# Patient Record
Sex: Male | Born: 1990 | Hispanic: Yes | Marital: Married | Smoking: Never smoker
Health system: Southern US, Community
[De-identification: ages and names within clinical notes are randomized; demographics above are authoritative.]

## PROBLEM LIST (undated history)

## (undated) NOTE — *Deleted (*Deleted)
Occupational Therapy Discharge Summary  Patient Details  Name: Jason Skinner MRN: [id number] Date of Birth: 10/23/1990     Patient has met 9 of 9 long term goals due to improved activity tolerance, improved balance, postural control, ability to compensate for deficits, functional use of  RIGHT upper extremity, improved attention, improved awareness and improved coordination.  Patient to discharge at overall Supervision level.  Patient's care partner is independent to provide the necessary physical and cognitive assistance at discharge.    Reasons goals not met: n/a  Recommendation:  Patient will benefit from ongoing skilled OT services in home health setting to continue to advance functional skills in the area of BADL.  Equipment: No equipment provided - pt already has a shower chair  Reasons for discharge: treatment goals met  Patient/family agrees with progress made and goals achieved: Yes  OT Discharge Precautions/Restrictions  Precautions Precautions: Fall   ADL ADL Eating: Set up Grooming: Supervision/safety Where Assessed-Grooming: Standing at sink Upper Body Bathing: Supervision/safety Where Assessed-Upper Body Bathing: Shower Lower Body Bathing: Supervision/safety Where Assessed-Lower Body Bathing: Shower Upper Body Dressing: Supervision/safety Where Assessed-Upper Body Dressing: Edge of bed Lower Body Dressing: Supervision/safety Where Assessed-Lower Body Dressing: Edge of bed Toileting: Supervision/safety Where Assessed-Toileting: Teacher, adult education: Close supervision Toilet Transfer Method: Proofreader: Engineer, technical sales: Unable to Engineer, technical sales: Close supervision Film/video editor Method: Designer, industrial/product: Emergency planning/management officer, Grab bars Vision Patient Visual Report: No change from baseline Ocular Range of Motion: Within Functional Limits Alignment/Gaze Preference:  Gaze left (pt can gaze to the R but often only focuses on the L) Tracking/Visual Pursuits: Decreased smoothness of horizontal tracking;Decreased smoothness of vertical tracking Visual Fields: No apparent deficits Perception  Perception: Impaired Inattention/Neglect: Does not attend to right visual field Praxis Praxis: Intact Cognition Overall Cognitive Status: Impaired/Different from baseline Selective Attention: Impaired Selective Attention Impairment: Functional basic Problem Solving: Impaired Problem Solving Impairment: Functional complex;Functional basic Sensation Sensation Light Touch: Appears Intact Hot/Cold: Appears Intact Proprioception: Appears Intact Stereognosis: Appears Intact Coordination Gross Motor Movements are Fluid and Coordinated: No Fine Motor Movements are Fluid and Coordinated: No Coordination and Movement Description: limited due to generalized weakness, slow movements but able to open containers without A; slow gross motor movement patterns Motor  Motor Motor: Abnormal postural alignment and control Motor - Skilled Clinical Observations: tends to lean to the R in sitting and standing Mobility     Trunk/Postural Assessment  Postural Control Postural Control: Deficits on evaluation Righting Reactions: delayed, R lean in sit and standing; will occasionally lose his balance posteriorly sitting EOB or in standing Protective Responses: delayed  Balance Static Sitting Balance Static Sitting - Level of Assistance: 7: Independent Dynamic Sitting Balance Dynamic Sitting - Level of Assistance: 5: Stand by assistance Static Standing Balance Static Standing - Level of Assistance: 5: Stand by assistance Dynamic Standing Balance Dynamic Standing - Level of Assistance: 5: Stand by assistance Extremity/Trunk Assessment RUE Assessment Active Range of Motion (AROM) Comments: [facility] Strength Comments: 4-5 shoulder; 4-/5 elbow and hand LUE Assessment Active  Range of Motion (AROM) Comments: [facility] Strength Comments: 4-5 shoulder; 4-/5 elbow and hand   Angell Honse 12/14/2019, 1:04 PM

---

## 2019-09-05 ENCOUNTER — Emergency Department (HOSPITAL_COMMUNITY): Payer: Medicaid Other

## 2019-09-05 ENCOUNTER — Inpatient Hospital Stay (HOSPITAL_COMMUNITY): Payer: Medicaid Other

## 2019-09-05 ENCOUNTER — Inpatient Hospital Stay (HOSPITAL_COMMUNITY): Admission: EM | Disposition: A | Discharge status: Rehabilitation Facility | Payer: Self-pay | Source: Home / Self Care

## 2019-09-05 ENCOUNTER — Emergency Department (HOSPITAL_COMMUNITY): Payer: Medicaid Other | Admitting: Certified Registered Nurse Anesthetist

## 2019-09-05 ENCOUNTER — Inpatient Hospital Stay (HOSPITAL_COMMUNITY)
Admission: EM | Admit: 2019-09-05 | Discharge: 2019-09-20 | DRG: 025 | Disposition: A | Discharge status: Rehabilitation Facility | Payer: Medicaid Other | Attending: General Surgery | Admitting: General Surgery

## 2019-09-05 DIAGNOSIS — Y990 Civilian activity done for income or pay: Secondary | ICD-10-CM

## 2019-09-05 DIAGNOSIS — Y9269 Other specified industrial and construction area as the place of occurrence of the external cause: Secondary | ICD-10-CM

## 2019-09-05 DIAGNOSIS — R4020 Unspecified coma: Secondary | ICD-10-CM | POA: Diagnosis not present

## 2019-09-05 DIAGNOSIS — S02119A Unspecified fracture of occiput, initial encounter for closed fracture: Secondary | ICD-10-CM | POA: Diagnosis not present

## 2019-09-05 DIAGNOSIS — Z20822 Contact with and (suspected) exposure to covid-19: Secondary | ICD-10-CM | POA: Diagnosis not present

## 2019-09-05 DIAGNOSIS — W11XXXA Fall on and from ladder, initial encounter: Secondary | ICD-10-CM | POA: Diagnosis present

## 2019-09-05 DIAGNOSIS — S066X9A Traumatic subarachnoid hemorrhage with loss of consciousness of unspecified duration, initial encounter: Secondary | ICD-10-CM | POA: Diagnosis not present

## 2019-09-05 DIAGNOSIS — E871 Hypo-osmolality and hyponatremia: Secondary | ICD-10-CM | POA: Diagnosis not present

## 2019-09-05 DIAGNOSIS — I82612 Acute embolism and thrombosis of superficial veins of left upper extremity: Secondary | ICD-10-CM | POA: Diagnosis present

## 2019-09-05 DIAGNOSIS — M21921 Unspecified acquired deformity of right upper arm: Secondary | ICD-10-CM

## 2019-09-05 DIAGNOSIS — D72829 Elevated white blood cell count, unspecified: Secondary | ICD-10-CM | POA: Diagnosis present

## 2019-09-05 DIAGNOSIS — G935 Compression of brain: Secondary | ICD-10-CM | POA: Diagnosis present

## 2019-09-05 DIAGNOSIS — S0219XA Other fracture of base of skull, initial encounter for closed fracture: Secondary | ICD-10-CM | POA: Diagnosis present

## 2019-09-05 DIAGNOSIS — J9690 Respiratory failure, unspecified, unspecified whether with hypoxia or hypercapnia: Secondary | ICD-10-CM

## 2019-09-05 DIAGNOSIS — D62 Acute posthemorrhagic anemia: Secondary | ICD-10-CM | POA: Diagnosis not present

## 2019-09-05 DIAGNOSIS — E876 Hypokalemia: Secondary | ICD-10-CM | POA: Diagnosis present

## 2019-09-05 DIAGNOSIS — Z9911 Dependence on respirator [ventilator] status: Secondary | ICD-10-CM

## 2019-09-05 DIAGNOSIS — R339 Retention of urine, unspecified: Secondary | ICD-10-CM | POA: Diagnosis not present

## 2019-09-05 DIAGNOSIS — S0291XB Unspecified fracture of skull, initial encounter for open fracture: Secondary | ICD-10-CM

## 2019-09-05 DIAGNOSIS — R4182 Altered mental status, unspecified: Secondary | ICD-10-CM | POA: Diagnosis present

## 2019-09-05 DIAGNOSIS — S065X9A Traumatic subdural hemorrhage with loss of consciousness of unspecified duration, initial encounter: Principal | ICD-10-CM | POA: Diagnosis present

## 2019-09-05 DIAGNOSIS — S065XAA Traumatic subdural hemorrhage with loss of consciousness status unknown, initial encounter: Secondary | ICD-10-CM | POA: Diagnosis present

## 2019-09-05 DIAGNOSIS — Z4659 Encounter for fitting and adjustment of other gastrointestinal appliance and device: Secondary | ICD-10-CM

## 2019-09-05 DIAGNOSIS — W19XXXA Unspecified fall, initial encounter: Secondary | ICD-10-CM

## 2019-09-05 DIAGNOSIS — R1310 Dysphagia, unspecified: Secondary | ICD-10-CM | POA: Diagnosis not present

## 2019-09-05 DIAGNOSIS — T1490XA Injury, unspecified, initial encounter: Secondary | ICD-10-CM

## 2019-09-05 HISTORY — PX: CRANIOTOMY: SHX93

## 2019-09-05 LAB — COMPREHENSIVE METABOLIC PANEL
ALT: 16 U/L (ref 0–44)
AST: 31 U/L (ref 15–41)
Albumin: 3.8 g/dL (ref 3.5–5.0)
Alkaline Phosphatase: 58 U/L (ref 38–126)
Anion gap: 13 (ref 5–15)
BUN: 8 mg/dL (ref 6–20)
CO2: 20 mmol/L — ABNORMAL LOW (ref 22–32)
Calcium: 8.7 mg/dL — ABNORMAL LOW (ref 8.9–10.3)
Chloride: 109 mmol/L (ref 98–111)
Creatinine, Ser: 0.84 mg/dL (ref 0.61–1.24)
GFR calc Af Amer: >60 mL/min (ref >60)
GFR calc non Af Amer: >60 mL/min (ref >60)
Glucose, Bld: 166 mg/dL — ABNORMAL HIGH (ref 70–99)
Potassium: 3.1 mmol/L — ABNORMAL LOW (ref 3.5–5.1)
Sodium: 142 mmol/L (ref 135–145)
Total Bilirubin: 1.2 mg/dL (ref 0.3–1.2)
Total Protein: 7.2 g/dL (ref 6.5–8.1)

## 2019-09-05 LAB — BASIC METABOLIC PANEL
Anion gap: 8 (ref 5–15)
BUN: 5 mg/dL — ABNORMAL LOW (ref 6–20)
CO2: 21 mmol/L — ABNORMAL LOW (ref 22–32)
Calcium: 7.5 mg/dL — ABNORMAL LOW (ref 8.9–10.3)
Chloride: 112 mmol/L — ABNORMAL HIGH (ref 98–111)
Creatinine, Ser: 0.63 mg/dL (ref 0.61–1.24)
GFR calc Af Amer: >60 mL/min (ref >60)
GFR calc non Af Amer: >60 mL/min (ref >60)
Glucose, Bld: 138 mg/dL — ABNORMAL HIGH (ref 70–99)
Potassium: 3.4 mmol/L — ABNORMAL LOW (ref 3.5–5.1)
Sodium: 141 mmol/L (ref 135–145)

## 2019-09-05 LAB — SAMPLE TO BLOOD BANK

## 2019-09-05 LAB — ABO/RH: ABO/RH(D): O POS

## 2019-09-05 LAB — POCT I-STAT 7, (LYTES, BLD GAS, ICA,H+H)
Acid-base deficit: 2 mmol/L (ref 0.0–2.0)
Bicarbonate: 23.1 mmol/L (ref 20.0–28.0)
Calcium, Ion: 1.08 mmol/L — ABNORMAL LOW (ref 1.15–1.40)
HCT: 38 % — ABNORMAL LOW (ref 39.0–52.0)
Hemoglobin: 12.9 g/dL — ABNORMAL LOW (ref 13.0–17.0)
O2 Saturation: 100 %
Patient temperature: 34.3
Potassium: 2.6 mmol/L — CL (ref 3.5–5.1)
Sodium: 141 mmol/L (ref 135–145)
TCO2: 24 mmol/L (ref 22–32)
pCO2 arterial: 34 mmHg (ref 32.0–48.0)
pH, Arterial: 7.429 (ref 7.350–7.450)
pO2, Arterial: 169 mmHg — ABNORMAL HIGH (ref 83.0–108.0)

## 2019-09-05 LAB — URINALYSIS, ROUTINE W REFLEX MICROSCOPIC
Bacteria, UA: NONE SEEN
Bilirubin Urine: NEGATIVE
Glucose, UA: NEGATIVE mg/dL
Ketones, ur: NEGATIVE mg/dL
Leukocytes,Ua: NEGATIVE
Nitrite: NEGATIVE
Protein, ur: NEGATIVE mg/dL
Specific Gravity, Urine: 1.03 (ref 1.005–1.030)
pH: 5 (ref 5.0–8.0)

## 2019-09-05 LAB — I-STAT CHEM 8, ED
BUN: 8 mg/dL (ref 6–20)
Calcium, Ion: 1.07 mmol/L — ABNORMAL LOW (ref 1.15–1.40)
Chloride: 106 mmol/L (ref 98–111)
Creatinine, Ser: 0.8 mg/dL (ref 0.61–1.24)
Glucose, Bld: 169 mg/dL — ABNORMAL HIGH (ref 70–99)
HCT: 44 % (ref 39.0–52.0)
Hemoglobin: 15 g/dL (ref 13.0–17.0)
Potassium: 2.8 mmol/L — ABNORMAL LOW (ref 3.5–5.1)
Sodium: 143 mmol/L (ref 135–145)
TCO2: 23 mmol/L (ref 22–32)

## 2019-09-05 LAB — CBC
HCT: 43.3 % (ref 39.0–52.0)
Hemoglobin: 14.6 g/dL (ref 13.0–17.0)
MCH: 31.5 pg (ref 26.0–34.0)
MCHC: 33.7 g/dL (ref 30.0–36.0)
MCV: 93.3 fL (ref 80.0–100.0)
Platelets: 284 10*3/uL (ref 150–400)
RBC: 4.64 MIL/uL (ref 4.22–5.81)
RDW: 14.1 % (ref 11.5–15.5)
WBC: 19.1 10*3/uL — ABNORMAL HIGH (ref 4.0–10.5)
nRBC: 0 % (ref 0.0–0.2)

## 2019-09-05 LAB — PROTIME-INR
INR: 1 (ref 0.8–1.2)
Prothrombin Time: 13.2 seconds (ref 11.4–15.2)

## 2019-09-05 LAB — SARS CORONAVIRUS 2 BY RT PCR (HOSPITAL ORDER, PERFORMED IN ~~LOC~~ HOSPITAL LAB): SARS Coronavirus 2: NEGATIVE

## 2019-09-05 LAB — HIV ANTIBODY (ROUTINE TESTING W REFLEX): HIV Screen 4th Generation wRfx: NONREACTIVE

## 2019-09-05 LAB — LACTIC ACID, PLASMA: Lactic Acid, Venous: 3.1 mmol/L (ref 0.5–1.9)

## 2019-09-05 LAB — PREPARE RBC (CROSSMATCH)

## 2019-09-05 LAB — ETHANOL: Alcohol, Ethyl (B): <10 mg/dL (ref <10)

## 2019-09-05 LAB — MRSA PCR SCREENING: MRSA by PCR: NEGATIVE

## 2019-09-05 SURGERY — CRANIOTOMY HEMATOMA EVACUATION SUBDURAL
Anesthesia: General | Site: Head | Laterality: Right

## 2019-09-05 MED ORDER — POTASSIUM CHLORIDE 10 MEQ/100ML IV SOLN
INTRAVENOUS | Status: DC | PRN
  Administered 2019-09-05 (×2): 10 meq via INTRAVENOUS

## 2019-09-05 MED ORDER — ROCURONIUM BROMIDE 10 MG/ML (PF) SYRINGE
PREFILLED_SYRINGE | INTRAVENOUS | Status: AC
  Filled 2019-09-05: qty 10

## 2019-09-05 MED ORDER — 0.9 % SODIUM CHLORIDE (POUR BTL) OPTIME
TOPICAL | Status: DC | PRN
  Administered 2019-09-05 (×3): 1000 mL

## 2019-09-05 MED ORDER — BUPIVACAINE-EPINEPHRINE 0.5% -1:200000 IJ SOLN
INTRAMUSCULAR | Status: AC
  Filled 2019-09-05: qty 1

## 2019-09-05 MED ORDER — MANNITOL 25 % IV SOLN
25.0000 g | Freq: Once | INTRAVENOUS | Status: DC
  Filled 2019-09-05: qty 100

## 2019-09-05 MED ORDER — BACITRACIN ZINC 500 UNIT/GM EX OINT
TOPICAL_OINTMENT | CUTANEOUS | Status: DC | PRN
  Administered 2019-09-05: 1 via TOPICAL

## 2019-09-05 MED ORDER — ONDANSETRON HCL 4 MG/2ML IJ SOLN
INTRAMUSCULAR | Status: AC
  Filled 2019-09-05: qty 2

## 2019-09-05 MED ORDER — IOHEXOL 350 MG/ML SOLN
100.0000 mL | Freq: Once | INTRAVENOUS | Status: AC | PRN
  Administered 2019-09-05: 100 mL via INTRAVENOUS

## 2019-09-05 MED ORDER — THROMBIN 5000 UNITS EX SOLR
CUTANEOUS | Status: AC
  Filled 2019-09-05: qty 5000

## 2019-09-05 MED ORDER — ONDANSETRON HCL 4 MG/2ML IJ SOLN: 4.0000 mg | Freq: Four times a day (QID) | INTRAMUSCULAR | Status: DC | PRN

## 2019-09-05 MED ORDER — FENTANYL CITRATE (PF) 250 MCG/5ML IJ SOLN
INTRAMUSCULAR | Status: DC | PRN
  Administered 2019-09-05 (×3): 50 ug via INTRAVENOUS
  Administered 2019-09-05: 100 ug via INTRAVENOUS

## 2019-09-05 MED ORDER — POTASSIUM CHLORIDE 10 MEQ/100ML IV SOLN
INTRAVENOUS | Status: AC
  Filled 2019-09-05: qty 200

## 2019-09-05 MED ORDER — ETOMIDATE 2 MG/ML IV SOLN
INTRAVENOUS | Status: AC | PRN
  Administered 2019-09-05: 20 mg via INTRAVENOUS

## 2019-09-05 MED ORDER — SODIUM CHLORIDE 0.9 % IV SOLN: INTRAVENOUS | Status: DC

## 2019-09-05 MED ORDER — FENTANYL CITRATE (PF) 250 MCG/5ML IJ SOLN
INTRAMUSCULAR | Status: AC
  Filled 2019-09-05: qty 5

## 2019-09-05 MED ORDER — THROMBIN 20000 UNITS EX SOLR
CUTANEOUS | Status: AC
  Filled 2019-09-05: qty 20000

## 2019-09-05 MED ORDER — ACETAMINOPHEN 325 MG PO TABS
650.0000 mg | ORAL_TABLET | ORAL | Status: DC | PRN
  Filled 2019-09-05: qty 2

## 2019-09-05 MED ORDER — FENTANYL 2500MCG IN NS 250ML (10MCG/ML) PREMIX INFUSION
0.0000 ug/h | INTRAVENOUS | Status: DC
  Administered 2019-09-06 – 2019-09-07 (×2): 100 ug/h via INTRAVENOUS
  Administered 2019-09-07 – 2019-09-08 (×2): 50 ug/h via INTRAVENOUS
  Administered 2019-09-10: 25 ug/h via INTRAVENOUS
  Filled 2019-09-05 (×4): qty 250

## 2019-09-05 MED ORDER — CHLORHEXIDINE GLUCONATE 0.12% ORAL RINSE (MEDLINE KIT)
15.0000 mL | Freq: Two times a day (BID) | OROMUCOSAL | Status: DC
  Administered 2019-09-05 – 2019-09-18 (×26): 15 mL via OROMUCOSAL

## 2019-09-05 MED ORDER — ORAL CARE MOUTH RINSE
15.0000 mL | OROMUCOSAL | Status: DC
  Administered 2019-09-05 – 2019-09-19 (×116): 15 mL via OROMUCOSAL

## 2019-09-05 MED ORDER — FENTANYL BOLUS VIA INFUSION
50.0000 ug | INTRAVENOUS | Status: DC | PRN
  Filled 2019-09-05: qty 50

## 2019-09-05 MED ORDER — ONDANSETRON 4 MG PO TBDP: 4.0000 mg | ORAL_TABLET | Freq: Four times a day (QID) | ORAL | Status: DC | PRN

## 2019-09-05 MED ORDER — SODIUM CHLORIDE 0.9 % IV SOLN: INTRAVENOUS | Status: DC | PRN

## 2019-09-05 MED ORDER — BACITRACIN ZINC 500 UNIT/GM EX OINT
TOPICAL_OINTMENT | CUTANEOUS | Status: AC
  Filled 2019-09-05: qty 28.35

## 2019-09-05 MED ORDER — SODIUM CHLORIDE 0.9% IV SOLUTION: Freq: Once | INTRAVENOUS | Status: DC

## 2019-09-05 MED ORDER — PANTOPRAZOLE SODIUM 40 MG PO TBEC: 40.0000 mg | DELAYED_RELEASE_TABLET | Freq: Every day | ORAL | Status: DC

## 2019-09-05 MED ORDER — THROMBIN 5000 UNITS EX SOLR
OROMUCOSAL | Status: DC | PRN
  Administered 2019-09-05 (×2): 5 mL via TOPICAL

## 2019-09-05 MED ORDER — ROCURONIUM BROMIDE 50 MG/5ML IV SOLN
INTRAVENOUS | Status: AC | PRN
  Administered 2019-09-05: 70 mg via INTRAVENOUS

## 2019-09-05 MED ORDER — LIDOCAINE 2% (20 MG/ML) 5 ML SYRINGE
INTRAMUSCULAR | Status: AC
  Filled 2019-09-05: qty 5

## 2019-09-05 MED ORDER — DEXAMETHASONE SODIUM PHOSPHATE 10 MG/ML IJ SOLN
INTRAMUSCULAR | Status: AC
  Filled 2019-09-05: qty 1

## 2019-09-05 MED ORDER — THROMBIN 20000 UNITS EX SOLR
CUTANEOUS | Status: DC | PRN
  Administered 2019-09-05: 20 mL via TOPICAL

## 2019-09-05 MED ORDER — PHENYLEPHRINE HCL-NACL 10-0.9 MG/250ML-% IV SOLN
INTRAVENOUS | Status: DC | PRN
  Administered 2019-09-05: 20 ug/min via INTRAVENOUS

## 2019-09-05 MED ORDER — POTASSIUM CHLORIDE 10 MEQ/100ML IV SOLN
10.0000 meq | INTRAVENOUS | Status: AC
  Administered 2019-09-05 (×2): 10 meq via INTRAVENOUS

## 2019-09-05 MED ORDER — ESMOLOL HCL 100 MG/10ML IV SOLN
INTRAVENOUS | Status: DC | PRN
Start: 2019-09-05 — End: 2019-09-05
  Administered 2019-09-05: 100 mg via INTRAVENOUS

## 2019-09-05 MED ORDER — BUPIVACAINE-EPINEPHRINE 0.5% -1:200000 IJ SOLN
INTRAMUSCULAR | Status: DC | PRN
  Administered 2019-09-05: 10 mL

## 2019-09-05 MED ORDER — FENTANYL CITRATE (PF) 100 MCG/2ML IJ SOLN: 50.0000 ug | Freq: Once | INTRAMUSCULAR | Status: DC

## 2019-09-05 MED ORDER — PANTOPRAZOLE SODIUM 40 MG IV SOLR
40.0000 mg | Freq: Every day | INTRAVENOUS | Status: DC
  Administered 2019-09-06 – 2019-09-11 (×6): 40 mg via INTRAVENOUS
  Filled 2019-09-05 (×6): qty 40

## 2019-09-05 MED ORDER — METOPROLOL TARTRATE 5 MG/5ML IV SOLN: 5.0000 mg | Freq: Four times a day (QID) | INTRAVENOUS | Status: DC | PRN

## 2019-09-05 MED ORDER — CEFAZOLIN SODIUM-DEXTROSE 2-3 GM-%(50ML) IV SOLR
INTRAVENOUS | Status: DC | PRN
  Administered 2019-09-05: 2 g via INTRAVENOUS

## 2019-09-05 MED ORDER — LEVETIRACETAM IN NACL 1000 MG/100ML IV SOLN
1000.0000 mg | Freq: Once | INTRAVENOUS | Status: AC
  Administered 2019-09-05: 1000 mg via INTRAVENOUS

## 2019-09-05 MED ORDER — MANNITOL 25 % IV SOLN
INTRAVENOUS | Status: DC | PRN
  Administered 2019-09-05: 25 g via INTRAVENOUS

## 2019-09-05 MED ORDER — CHLORHEXIDINE GLUCONATE CLOTH 2 % EX PADS
6.0000 | MEDICATED_PAD | Freq: Every day | CUTANEOUS | Status: DC
  Administered 2019-09-05 – 2019-09-07 (×3): 6 via TOPICAL

## 2019-09-05 MED ORDER — LEVETIRACETAM IN NACL 500 MG/100ML IV SOLN
500.0000 mg | Freq: Two times a day (BID) | INTRAVENOUS | Status: AC
  Administered 2019-09-05 – 2019-09-12 (×14): 500 mg via INTRAVENOUS
  Filled 2019-09-05 (×17): qty 100

## 2019-09-05 MED ORDER — ACETAMINOPHEN 650 MG RE SUPP: 650.0000 mg | RECTAL | Status: DC | PRN

## 2019-09-05 MED ORDER — ROCURONIUM BROMIDE 10 MG/ML (PF) SYRINGE
PREFILLED_SYRINGE | INTRAVENOUS | Status: DC | PRN
  Administered 2019-09-05: 10 mg via INTRAVENOUS
  Administered 2019-09-05: 40 mg via INTRAVENOUS
  Administered 2019-09-05: 50 mg via INTRAVENOUS

## 2019-09-05 MED ORDER — FENTANYL 2500MCG IN NS 250ML (10MCG/ML) PREMIX INFUSION
50.0000 ug/h | INTRAVENOUS | Status: DC
  Administered 2019-09-05: 50 ug/h via INTRAVENOUS
  Filled 2019-09-05: qty 250

## 2019-09-05 MED ORDER — SODIUM CHLORIDE 0.9 % IV SOLN
INTRAVENOUS | Status: DC | PRN
  Administered 2019-09-05: 500 mL

## 2019-09-05 MED ORDER — PROPOFOL 1000 MG/100ML IV EMUL
0.0000 ug/kg/min | INTRAVENOUS | Status: DC
  Administered 2019-09-05 (×2): 20 ug/kg/min via INTRAVENOUS
  Filled 2019-09-05: qty 100

## 2019-09-05 SURGICAL SUPPLY — 81 items
APL SKNCLS STERI-STRIP NONHPOA (GAUZE/BANDAGES/DRESSINGS) ×1
BAG DECANTER FOR FLEXI CONT (MISCELLANEOUS) ×2 IMPLANT
BAND INSRT 18 STRL LF DISP RB (MISCELLANEOUS)
BAND RUBBER #18 3X1/16 STRL (MISCELLANEOUS) IMPLANT
BATTERY IQ STERILE (MISCELLANEOUS) IMPLANT
BENZOIN TINCTURE PRP APPL 2/3 (GAUZE/BANDAGES/DRESSINGS) ×2 IMPLANT
BIT DRILL WIRE PASS 1.3MM (BIT) IMPLANT
BLADE CLIPPER SPEC (BLADE) ×2 IMPLANT
BTRY SRG DRVR 1.5 IQ (MISCELLANEOUS)
BUR ACORN 6.0 PRECISION (BURR) IMPLANT
BUR PRECISION FLUTE 6.0 (BURR) ×2 IMPLANT
BUR SPIRAL ROUTER 2.3 (BUR) ×2 IMPLANT
CANISTER SUCT 3000ML PPV (MISCELLANEOUS) ×4 IMPLANT
CARTRIDGE OIL MAESTRO DRILL (MISCELLANEOUS) ×1 IMPLANT
CATH FOLEY LATEX FREE 16FR (CATHETERS) ×2
CATH FOLEY LF 16FR (CATHETERS) ×1 IMPLANT
CATH VENTRIC 35X38 W/TROCAR LG (CATHETERS) ×2 IMPLANT
CLIP RANEY DISP (INSTRUMENTS) ×2 IMPLANT
CLIP VESOCCLUDE MED 6/CT (CLIP) IMPLANT
COVER BACK TABLE 60X90IN (DRAPES) IMPLANT
COVER WAND RF STERILE (DRAPES) IMPLANT
DIFFUSER DRILL AIR PNEUMATIC (MISCELLANEOUS) ×2 IMPLANT
DRAIN JACKSON PRATT 10MM FLAT (MISCELLANEOUS) ×4 IMPLANT
DRAPE INCISE IOBAN 66X45 STRL (DRAPES) ×2 IMPLANT
DRAPE NEUROLOGICAL W/INCISE (DRAPES) ×2 IMPLANT
DRAPE SURG 17X23 STRL (DRAPES) ×2 IMPLANT
DRAPE WARM FLUID 44X44 (DRAPES) ×2 IMPLANT
DRILL WIRE PASS 1.3MM (BIT)
DRSG OPSITE POSTOP 4X8 (GAUZE/BANDAGES/DRESSINGS) ×2 IMPLANT
ELECT REM PT RETURN 9FT ADLT (ELECTROSURGICAL) ×2
ELECTRODE REM PT RTRN 9FT ADLT (ELECTROSURGICAL) ×1 IMPLANT
EVACUATOR 1/8 PVC DRAIN (DRAIN) IMPLANT
EVACUATOR SILICONE 100CC (DRAIN) ×4 IMPLANT
FORCEPS BIPOLAR SPETZLER 8 1.0 (NEUROSURGERY SUPPLIES) ×2 IMPLANT
GAUZE 4X4 16PLY RFD (DISPOSABLE) IMPLANT
GAUZE SPONGE 4X4 12PLY STRL (GAUZE/BANDAGES/DRESSINGS) ×2 IMPLANT
GLOVE BIO SURGEON STRL SZ 6.5 (GLOVE) ×4 IMPLANT
GLOVE BIO SURGEON STRL SZ8 (GLOVE) ×2 IMPLANT
GLOVE BIO SURGEON STRL SZ8.5 (GLOVE) ×2 IMPLANT
GLOVE BIOGEL PI IND STRL 6.5 (GLOVE) ×1 IMPLANT
GLOVE BIOGEL PI IND STRL 7.0 (GLOVE) ×1 IMPLANT
GLOVE BIOGEL PI INDICATOR 6.5 (GLOVE) ×1
GLOVE BIOGEL PI INDICATOR 7.0 (GLOVE) ×1
GLOVE EXAM NITRILE XL STR (GLOVE) IMPLANT
GOWN STRL REUS W/ TWL LRG LVL3 (GOWN DISPOSABLE) ×2 IMPLANT
GOWN STRL REUS W/ TWL XL LVL3 (GOWN DISPOSABLE) ×1 IMPLANT
GOWN STRL REUS W/TWL LRG LVL3 (GOWN DISPOSABLE) ×4
GOWN STRL REUS W/TWL XL LVL3 (GOWN DISPOSABLE) ×2
HEMOSTAT POWDER SURGIFOAM 1G (HEMOSTASIS) ×4 IMPLANT
KIT BASIN OR (CUSTOM PROCEDURE TRAY) ×2 IMPLANT
KIT DRAIN CSF ACCUDRAIN (MISCELLANEOUS) ×2 IMPLANT
KIT TURNOVER KIT B (KITS) ×2 IMPLANT
MARKER SKIN DUAL TIP RULER LAB (MISCELLANEOUS) IMPLANT
NEEDLE HYPO 22GX1.5 SAFETY (NEEDLE) ×2 IMPLANT
NS IRRIG 1000ML POUR BTL (IV SOLUTION) ×6 IMPLANT
OIL CARTRIDGE MAESTRO DRILL (MISCELLANEOUS) ×2
PACK CRANIOTOMY CUSTOM (CUSTOM PROCEDURE TRAY) ×2 IMPLANT
PAD ARMBOARD 7.5X6 YLW CONV (MISCELLANEOUS) ×2 IMPLANT
PATTIES SURGICAL .25X.25 (GAUZE/BANDAGES/DRESSINGS) IMPLANT
PATTIES SURGICAL .5 X.5 (GAUZE/BANDAGES/DRESSINGS) IMPLANT
PATTIES SURGICAL .5 X3 (DISPOSABLE) IMPLANT
PATTIES SURGICAL 1X1 (DISPOSABLE) IMPLANT
PIN MAYFIELD SKULL DISP (PIN) ×2 IMPLANT
SLEEVE SURGEON STRL (DRAPES) ×2 IMPLANT
SPONGE NEURO XRAY DETECT 1X3 (DISPOSABLE) IMPLANT
SPONGE SURGIFOAM ABS GEL 100 (HEMOSTASIS) ×2 IMPLANT
STAPLER SKIN PROX WIDE 3.9 (STAPLE) ×8 IMPLANT
STRIP CLOSURE SKIN 1/2X4 (GAUZE/BANDAGES/DRESSINGS) ×2 IMPLANT
SUT ETHILON 3 0 FSL (SUTURE) ×4 IMPLANT
SUT NURALON 4 0 TR CR/8 (SUTURE) ×2 IMPLANT
SUT PROLENE 6 0 BV (SUTURE) IMPLANT
SUT VIC AB 2-0 CP2 18 (SUTURE) ×12 IMPLANT
SUT VIC AB 3-0 FS2 27 (SUTURE) IMPLANT
SUT VICRYL 4-0 PS2 18IN ABS (SUTURE) IMPLANT
SYR CONTROL 10ML LL (SYRINGE) ×2 IMPLANT
TOWEL GREEN STERILE (TOWEL DISPOSABLE) ×2 IMPLANT
TOWEL GREEN STERILE FF (TOWEL DISPOSABLE) ×2 IMPLANT
TRAY FOLEY MTR SLVR 16FR STAT (SET/KITS/TRAYS/PACK) IMPLANT
TUBE CONNECTING 20X1/4 (TUBING) ×2 IMPLANT
UNDERPAD 30X36 HEAVY ABSORB (UNDERPADS AND DIAPERS) IMPLANT
WATER STERILE IRR 1000ML POUR (IV SOLUTION) ×2 IMPLANT

## 2019-09-05 NOTE — Anesthesia Preprocedure Evaluation (Addendum)
Anesthesia Evaluation  Patient identified by MRN, date of birth, ID bandPreop documentation limited or incomplete due to emergent nature of procedure.  Airway Mallampati: Intubated       Dental   Pulmonary    Pulmonary exam normal breath sounds clear to auscultation       Cardiovascular  Rhythm:Regular Rate:Normal     Neuro/Psych Emergent trauma- fall from ladder 20 ft from ground, brain hemorrhage, intubated and sedated    GI/Hepatic   Endo/Other    Renal/GU      Musculoskeletal   Abdominal   Peds  Hematology   Anesthesia Other Findings   Reproductive/Obstetrics                            Anesthesia Physical Anesthesia Plan  ASA: IV and emergent  Anesthesia Plan: General   Post-op Pain Management:    Induction: Intravenous  PONV Risk Score and Plan: Treatment may vary due to age or medical condition  Airway Management Planned: Oral ETT  Additional Equipment: Arterial line  Intra-op Plan:   Post-operative Plan: Post-operative intubation/ventilation  Informed Consent:     Only emergency history available  Plan Discussed with: CRNA  Anesthesia Plan Comments:         Anesthesia Quick Evaluation

## 2019-09-05 NOTE — Consult Note (Signed)
Reason for Consult: Coma, subdural hematoma Referring Physician: The patient is a 29 year old Hispanic male from [place] who by report fell off a 25 foot ladder striking his head.  Again, by report, he was talking at the scene and could answer questions.  During transportation to [facility] via EMS the patient became unconscious.  He was intubated in the ER and a stat head CT demonstrated occipital and clivus fractures and a large right subdural hematoma with midline shift.  A neurosurgical consultation was requested via the trauma service.  I immediately saw the patient.  In the ER the patient was comatose and unresponsive.  There were no family members available.  I am dictating this consultation after the surgery because of the emergent nature of his surgery.  Jason Skinner is an 29 y.o. male.  HPI: As above  No past medical history on file.    No family history on file.  Social History:  has no history on file for tobacco use, alcohol use, and drug use.  Allergies: Not on File  Medications:  I have reviewed the patient's current medications. Prior to Admission:  No medications prior to admission.   Scheduled: . sodium chloride   Intravenous Once  . pantoprazole  40 mg Oral Daily   Or  . pantoprazole (PROTONIX) IV  40 mg Intravenous Daily   Continuous: . sodium chloride Stopped (09/05/19 1426)  . fentaNYL infusion INTRAVENOUS    . levETIRAcetam    . potassium chloride    . propofol (DIPRIVAN) infusion 20 mcg/kg/min (09/05/19 1346)   EXH:BZJIRCVELFYBO, acetaminophen, metoprolol tartrate, ondansetron **OR** ondansetron (ZOFRAN) IV Anti-infectives (From admission, onward)   Start     Dose/Rate Route Frequency Ordered Stop   09/05/19 1250  bacitracin 50,000 Units in sodium chloride 0.9 % 500 mL irrigation  Status:  Discontinued          As needed 09/05/19 1250 09/05/19 1414       Results for orders placed or performed during the hospital encounter of 09/05/19 (from  the past 48 hour(s))  Sample to Blood Bank     Status: None   Collection Time: 09/05/19 11:00 AM  Result Value Ref Range   Blood Bank Specimen SAMPLE AVAILABLE FOR TESTING    Sample Expiration      09/06/2019,2359 Performed at [facility], [place] [address], [place], [place] [address]   Type and screen [place]     Status: None (Preliminary result)   Collection Time: 09/05/19 11:00 AM  Result Value Ref Range   ABO/RH(D) O POS    Antibody Screen NEG    Sample Expiration      09/08/2019,2359 Performed at [facility], [place] [address], [place], [place] [address]    Unit Number [id number]    Blood Component Type RED CELLS,LR    Unit division 00    Status of Unit ALLOCATED    Transfusion Status OK TO TRANSFUSE    Crossmatch Result Compatible    Unit Number [id number]    Blood Component Type RED CELLS,LR    Unit division 00    Status of Unit ALLOCATED    Transfusion Status OK TO TRANSFUSE    Crossmatch Result Compatible   ABO/Rh     Status: None   Collection Time: 09/05/19 11:00 AM  Result Value Ref Range   ABO/RH(D)      O POS Performed at [facility] [address], [place], [place] [address]  Comprehensive metabolic panel     Status: Abnormal   Collection Time: 09/05/19 11:12 AM  Result Value Ref Range   Sodium 142 135 - 145 mmol/L   Potassium 3.1 (L) 3.5 - 5.1 mmol/L   Chloride 109 98 - 111 mmol/L   CO2 20 (L) 22 - 32 mmol/L   Glucose, Bld 166 (H) 70 - 99 mg/dL    Comment: Glucose reference range applies only to samples taken after fasting for at least 8 hours.   BUN 8 6 - 20 mg/dL   Creatinine, Ser 0.84 0.61 - 1.24 mg/dL   Calcium 8.7 (L) 8.9 - 10.3 mg/dL   Total Protein 7.2 6.5 - 8.1 g/dL   Albumin 3.8 3.5 - 5.0 g/dL   AST 31 15 - 41 U/L   ALT 16 0 - 44 U/L   Alkaline Phosphatase 58 38 - 126 U/L   Total Bilirubin 1.2 0.3 - 1.2 mg/dL   GFR calc non Af Amer >60 >60 mL/min   GFR calc Af Amer >60 >60 mL/min   Anion gap  13 5 - 15    Comment: Performed at [facility], [place] [address], [place], [place] [address]  CBC     Status: Abnormal   Collection Time: 09/05/19 11:12 AM  Result Value Ref Range   WBC 19.1 (H) 4.0 - 10.5 K/uL   RBC 4.64 4.22 - 5.81 MIL/uL   Hemoglobin 14.6 13.0 - 17.0 g/dL   HCT 43.3 39 - 52 %   MCV 93.3 80.0 - 100.0 fL   MCH 31.5 26.0 - 34.0 pg   MCHC 33.7 30.0 - 36.0 g/dL   RDW 14.1 11.5 - 15.5 %   Platelets 284 150 - 400 K/uL   nRBC 0.0 0.0 - 0.2 %    Comment: Performed at [facility], [place] [address], [place], [place] [address]  Ethanol     Status: None   Collection Time: 09/05/19 11:12 AM  Result Value Ref Range   Alcohol, Ethyl (B) <10 <10 mg/dL    Comment: (NOTE) Lowest detectable limit for serum alcohol is 10 mg/dL.  For medical purposes only. Performed at [facility], [place] [address], [place], [place] [address]   Lactic acid, plasma     Status: Abnormal   Collection Time: 09/05/19 11:12 AM  Result Value Ref Range   Lactic Acid, Venous 3.1 (HH) 0.5 - 1.9 mmol/L    Comment: CRITICAL RESULT CALLED TO, READ BACK BY AND VERIFIED WITH: HUNTER BOTTS,RN @1210  09/05/19 VANG.J Performed at [facility], [place] [address], [place], [place] [address]   Protime-INR     Status: None   Collection Time: 09/05/19 11:12 AM  Result Value Ref Range   Prothrombin Time 13.2 11.4 - 15.2 seconds   INR 1.0 0.8 - 1.2    Comment: (NOTE) INR goal varies based on device and disease states. Performed at [facility], [place] [address], [place],  [address]   I-Stat Chem 8, ED     Status: Abnormal   Collection Time: 09/05/19 11:16 AM  Result Value Ref Range   Sodium 143 135 - 145 mmol/L   Potassium 2.8 (L) 3.5 - 5.1 mmol/L   Chloride 106 98 - 111 mmol/L   BUN 8 6 - 20 mg/dL   Creatinine, Ser 0.80 0.61 - 1.24 mg/dL   Glucose, Bld 169 (H) 70 - 99 mg/dL    Comment: Glucose reference range applies only to  samples taken after fasting for at least 8 hours.    Calcium, Ion 1.07 (L) 1.15 - 1.40 mmol/L   TCO2 23 22 - 32 mmol/L   Hemoglobin 15.0 13.0 - 17.0 g/dL   HCT 44.0 39 - 52 %  SARS Coronavirus 2 by RT PCR (hospital order, performed in [facility] hospital lab) Nasopharyngeal Nasopharyngeal Swab     Status: None   Collection Time: 09/05/19 11:50 AM   Specimen: Nasopharyngeal Swab  Result Value Ref Range   SARS Coronavirus 2 NEGATIVE NEGATIVE    Comment: (NOTE) SARS-CoV-2 target nucleic acids are NOT DETECTED.  The SARS-CoV-2 RNA is generally detectable in upper and lower respiratory specimens during the acute phase of infection. The lowest concentration of SARS-CoV-2 viral copies this assay can detect is 250 copies / mL. A negative result does not preclude SARS-CoV-2 infection and should not be used as the sole basis for treatment or other patient management decisions.  A negative result may occur with improper specimen collection / handling, submission of specimen other than nasopharyngeal swab, presence of viral mutation(s) within the areas targeted by this assay, and inadequate number of viral copies (<250 copies / mL). A negative result must be combined with clinical observations, patient history, and epidemiological information.  Fact Sheet for Patients:   StrictlyIdeas.no  Fact Sheet for Healthcare Providers: BankingDealers.co.za  This test is not yet approved or  cleared by the [place] FDA and has been authorized for detection and/or diagnosis of SARS-CoV-2 by FDA under an Emergency Use Authorization (EUA).  This EUA will remain in effect (meaning this test can be used) for the duration of the COVID-19 declaration under Section 564(b)(1) of the Act, 21 U.S.C. section 360bbb-3(b)(1), unless the authorization is terminated or revoked sooner.  Performed at [facility], [place] [address] [place], [place] [address]   Prepare RBC (crossmatch)     Status: None    Collection Time: 09/05/19 12:15 PM  Result Value Ref Range   Order Confirmation      ORDER PROCESSED BY BLOOD BANK Performed at [facility], [place] [address], [place],  [address]     CT HEAD WO CONTRAST  Result Date: 09/05/2019 CLINICAL DATA:  Level 1 trauma EXAM: CT HEAD WITHOUT CONTRAST CT MAXILLOFACIAL WITHOUT CONTRAST CT CERVICAL SPINE WITHOUT CONTRAST TECHNIQUE: Multidetector CT imaging of the head, cervical spine, and maxillofacial structures were performed using the standard protocol without intravenous contrast. Multiplanar CT image reconstructions of the cervical spine and maxillofacial structures were also generated. COMPARISON:  None. FINDINGS: CT HEAD FINDINGS Brain: Mixed density subdural hematoma along the right cerebral convexity and left frontal pole. Mixed density could be from a continued and un clotted bleeding, especially on the right. The right-sided hematoma measures up to 15 mm in thickness. Midline shift measures up to 2 cm. Pneumocephalus with intraventricular extension. Right uncal herniation. Mix of contusion and subarachnoid hemorrhage over the left temporal pole which is edematous. No visible infarct. Vascular: Negative Skull: Midline skull fracture involving the occiput and clivus. There is extension into the right sphenoid sinus, which accounts for pneumocephalus and sinus opacification. Posterior scalp contusion CT MAXILLOFACIAL FINDINGS Osseous: Intact and located mandible. Midline clivus fracture extending to the right sphenoid sinus. Orbits: No evidence of injury. Sinuses: Right sphenoid and maxillary hemosinus Soft tissues: Negative CT CERVICAL SPINE FINDINGS Alignment: Normal Skull base and vertebrae: Clival fracture. No cervical spine fracture. Soft tissues and spinal canal: Gas present around the dens. The tectorial  membrane is continuous and visible. No visible canal hematoma Disc levels:  No degenerative changes or visible impingement. Upper chest:  Reported separately Critical Value/emergent results were discussed in person on 09/05/2019 at 11:35 am with Dr Bobbye Morton. IMPRESSION: Head CT: 1. Right more than left cerebral convexity subdural hematoma with leftward midline shift of 2 cm and right uncal herniation. 2. Left temporal pole contusion with overlying subarachnoid hemorrhage. 3. Occipital and clival midline fractures extending into the right sphenoid sinus with pneumocephalus. Cervical spine CT: Gas around the dens presumably related to the skull base fracture. Craniocervical alignment is normal and the tectorial membrane is visible. Alar or other ligamentous injury is still conceivable. Maxillofacial CT: Clival fracture extending into the right sphenoid sinus with hemosinus and pneumocephalus. Electronically Signed   By: Monte Fantasia M.D.   On: 09/05/2019 11:40   CT CHEST W CONTRAST  Result Date: 09/05/2019 CLINICAL DATA:  Moderate to severe chest trauma EXAM: CT CHEST, ABDOMEN, AND PELVIS WITH CONTRAST TECHNIQUE: Multidetector CT imaging of the chest, abdomen and pelvis was performed following the standard protocol during bolus administration of intravenous contrast. CONTRAST:  Dose is currently not available COMPARISON:  None. FINDINGS: CT CHEST FINDINGS Cardiovascular: Normal heart size. No pericardial effusion. No evidence of great vessel injury. Mediastinum/Nodes: No hematoma or pneumomediastinum. There is a coarse calcification in the right lobe of the thyroid which is asymmetrically larger with a 2 cm nodular projection extending inferiorly that is isodense to the remaining gland. Lungs/Pleura: No hemothorax, pneumothorax, or lung contusion. Dependent opacity with volume loss is likely atelectasis and aspiration. Musculoskeletal: No acute finding CT ABDOMEN PELVIS FINDINGS Hepatobiliary: No hepatic injury or perihepatic hematoma. Gallbladder is unremarkable Pancreas: Negative Spleen: Left upper quadrant poly splenia or localize splenosis. No  acute injury. Adrenals/Urinary Tract: No adrenal hemorrhage or renal injury identified. Bladder is unremarkable. Stomach/Bowel: No evidence of injury Vascular/Lymphatic: No acute vascular finding Reproductive: Negative Other: No ascites or pneumoperitoneum. Musculoskeletal: L1 and L2 right transverse process fractures. Vacuum phenomenon around the sacroiliac joints without diastasis IMPRESSION: 1. L1 and L2 right transverse process fractures. 2. Bilateral dependent atelectasis and or aspiration. 3. 2 cm nodular projection from the right thyroid lobe. After convalescence, recommend thyroid US(Ref: J Am Coll Radiol. 2015 Feb;12(2): 143-50). Electronically Signed   By: Monte Fantasia M.D.   On: 09/05/2019 11:45   CT CERVICAL SPINE WO CONTRAST  Result Date: 09/05/2019 CLINICAL DATA:  Level 1 trauma EXAM: CT HEAD WITHOUT CONTRAST CT MAXILLOFACIAL WITHOUT CONTRAST CT CERVICAL SPINE WITHOUT CONTRAST TECHNIQUE: Multidetector CT imaging of the head, cervical spine, and maxillofacial structures were performed using the standard protocol without intravenous contrast. Multiplanar CT image reconstructions of the cervical spine and maxillofacial structures were also generated. COMPARISON:  None. FINDINGS: CT HEAD FINDINGS Brain: Mixed density subdural hematoma along the right cerebral convexity and left frontal pole. Mixed density could be from a continued and un clotted bleeding, especially on the right. The right-sided hematoma measures up to 15 mm in thickness. Midline shift measures up to 2 cm. Pneumocephalus with intraventricular extension. Right uncal herniation. Mix of contusion and subarachnoid hemorrhage over the left temporal pole which is edematous. No visible infarct. Vascular: Negative Skull: Midline skull fracture involving the occiput and clivus. There is extension into the right sphenoid sinus, which accounts for pneumocephalus and sinus opacification. Posterior scalp contusion CT MAXILLOFACIAL FINDINGS  Osseous: Intact and located mandible. Midline clivus fracture extending to the right sphenoid sinus. Orbits: No evidence of injury. Sinuses: Right sphenoid  and maxillary hemosinus Soft tissues: Negative CT CERVICAL SPINE FINDINGS Alignment: Normal Skull base and vertebrae: Clival fracture. No cervical spine fracture. Soft tissues and spinal canal: Gas present around the dens. The tectorial membrane is continuous and visible. No visible canal hematoma Disc levels:  No degenerative changes or visible impingement. Upper chest: Reported separately Critical Value/emergent results were discussed in person on 09/05/2019 at 11:35 am with Dr Bobbye Morton. IMPRESSION: Head CT: 1. Right more than left cerebral convexity subdural hematoma with leftward midline shift of 2 cm and right uncal herniation. 2. Left temporal pole contusion with overlying subarachnoid hemorrhage. 3. Occipital and clival midline fractures extending into the right sphenoid sinus with pneumocephalus. Cervical spine CT: Gas around the dens presumably related to the skull base fracture. Craniocervical alignment is normal and the tectorial membrane is visible. Alar or other ligamentous injury is still conceivable. Maxillofacial CT: Clival fracture extending into the right sphenoid sinus with hemosinus and pneumocephalus. Electronically Signed   By: Monte Fantasia M.D.   On: 09/05/2019 11:40   CT ABDOMEN PELVIS W CONTRAST  Result Date: 09/05/2019 CLINICAL DATA:  Moderate to severe chest trauma EXAM: CT CHEST, ABDOMEN, AND PELVIS WITH CONTRAST TECHNIQUE: Multidetector CT imaging of the chest, abdomen and pelvis was performed following the standard protocol during bolus administration of intravenous contrast. CONTRAST:  Dose is currently not available COMPARISON:  None. FINDINGS: CT CHEST FINDINGS Cardiovascular: Normal heart size. No pericardial effusion. No evidence of great vessel injury. Mediastinum/Nodes: No hematoma or pneumomediastinum. There is a coarse  calcification in the right lobe of the thyroid which is asymmetrically larger with a 2 cm nodular projection extending inferiorly that is isodense to the remaining gland. Lungs/Pleura: No hemothorax, pneumothorax, or lung contusion. Dependent opacity with volume loss is likely atelectasis and aspiration. Musculoskeletal: No acute finding CT ABDOMEN PELVIS FINDINGS Hepatobiliary: No hepatic injury or perihepatic hematoma. Gallbladder is unremarkable Pancreas: Negative Spleen: Left upper quadrant poly splenia or localize splenosis. No acute injury. Adrenals/Urinary Tract: No adrenal hemorrhage or renal injury identified. Bladder is unremarkable. Stomach/Bowel: No evidence of injury Vascular/Lymphatic: No acute vascular finding Reproductive: Negative Other: No ascites or pneumoperitoneum. Musculoskeletal: L1 and L2 right transverse process fractures. Vacuum phenomenon around the sacroiliac joints without diastasis IMPRESSION: 1. L1 and L2 right transverse process fractures. 2. Bilateral dependent atelectasis and or aspiration. 3. 2 cm nodular projection from the right thyroid lobe. After convalescence, recommend thyroid US(Ref: J Am Coll Radiol. 2015 Feb;12(2): 143-50). Electronically Signed   By: Monte Fantasia M.D.   On: 09/05/2019 11:45   DG Pelvis Portable  Result Date: 09/05/2019 CLINICAL DATA:  Level 1 trauma. Fell 20 feet. EXAM: PORTABLE PELVIS 1-2 VIEWS COMPARISON:  None. FINDINGS: Both hips are normally located. No hip fracture. The pubic symphysis and SI joints are intact. No definite pelvic fractures. IMPRESSION: No acute bony findings. Electronically Signed   By: Marijo Sanes M.D.   On: 09/05/2019 11:22   DG Chest Port 1 View  Result Date: 09/05/2019 CLINICAL DATA:  Level 1 trauma. Fell 20 feet. EXAM: PORTABLE CHEST 1 VIEW COMPARISON:  None. FINDINGS: The endotracheal tube is in good position, 3.8 cm above the carina. The cardiac silhouette, mediastinal and hilar contours are within normal limits.  The lungs are clear. No pleural effusion or pneumothorax. The bony thorax is grossly intact. No obvious acute rib fractures. Remote healed mid left clavicle fracture is noted. IMPRESSION: 1. Endotracheal tube in good position, 3.8 cm above the carina. 2. No acute cardiopulmonary findings.  Electronically Signed   By: Marijo Sanes M.D.   On: 09/05/2019 11:22   CT MAXILLOFACIAL WO CONTRAST  Result Date: 09/05/2019 CLINICAL DATA:  Level 1 trauma EXAM: CT HEAD WITHOUT CONTRAST CT MAXILLOFACIAL WITHOUT CONTRAST CT CERVICAL SPINE WITHOUT CONTRAST TECHNIQUE: Multidetector CT imaging of the head, cervical spine, and maxillofacial structures were performed using the standard protocol without intravenous contrast. Multiplanar CT image reconstructions of the cervical spine and maxillofacial structures were also generated. COMPARISON:  None. FINDINGS: CT HEAD FINDINGS Brain: Mixed density subdural hematoma along the right cerebral convexity and left frontal pole. Mixed density could be from a continued and un clotted bleeding, especially on the right. The right-sided hematoma measures up to 15 mm in thickness. Midline shift measures up to 2 cm. Pneumocephalus with intraventricular extension. Right uncal herniation. Mix of contusion and subarachnoid hemorrhage over the left temporal pole which is edematous. No visible infarct. Vascular: Negative Skull: Midline skull fracture involving the occiput and clivus. There is extension into the right sphenoid sinus, which accounts for pneumocephalus and sinus opacification. Posterior scalp contusion CT MAXILLOFACIAL FINDINGS Osseous: Intact and located mandible. Midline clivus fracture extending to the right sphenoid sinus. Orbits: No evidence of injury. Sinuses: Right sphenoid and maxillary hemosinus Soft tissues: Negative CT CERVICAL SPINE FINDINGS Alignment: Normal Skull base and vertebrae: Clival fracture. No cervical spine fracture. Soft tissues and spinal canal: Gas present  around the dens. The tectorial membrane is continuous and visible. No visible canal hematoma Disc levels:  No degenerative changes or visible impingement. Upper chest: Reported separately Critical Value/emergent results were discussed in person on 09/05/2019 at 11:35 am with Dr Bobbye Morton. IMPRESSION: Head CT: 1. Right more than left cerebral convexity subdural hematoma with leftward midline shift of 2 cm and right uncal herniation. 2. Left temporal pole contusion with overlying subarachnoid hemorrhage. 3. Occipital and clival midline fractures extending into the right sphenoid sinus with pneumocephalus. Cervical spine CT: Gas around the dens presumably related to the skull base fracture. Craniocervical alignment is normal and the tectorial membrane is visible. Alar or other ligamentous injury is still conceivable. Maxillofacial CT: Clival fracture extending into the right sphenoid sinus with hemosinus and pneumocephalus. Electronically Signed   By: Monte Fantasia M.D.   On: 09/05/2019 11:40    ROS Blood pressure (!) 168/76, pulse (!) 55, temperature (!) 96.2 F (35.7 C), temperature source Tympanic, resp. rate 18, height 5\' 10"  (1.778 m), weight 65 kg, SpO2 100 %. Estimated body mass index is 20.56 kg/m as calculated from the following:   Height as of this encounter: 5\' 10"  (1.778 m).   Weight as of this encounter: 65 kg.  Physical Exam  General: A thin comatose, intubated 29 year old Hispanic male with a cervical collar on.  HEENT: The patient has an abrasion over his right forehead.  His pupils are approximately 7 mm on the right, 6 mm on the left, both irregular and nonreactive.  Neck: No obvious abnormalities he has a hard collar on.  Thorax: Symmetric  Abdomen: Soft  Extremities: The patient has swelling and deformity of his right elbow.  Neurologic exam: The patient is Glasgow Coma Scale 3 intubated.  His pupils are as above.  There is no corneal reflex bilaterally.  He does not breathe  over the ventilator.  There is no response to noxious stimuli.  I have reviewed the patient's head CT performed at [facility] today.  He has a large right subdural hematoma with significant midline shift.  He has  a midline occipital skull fracture with a fracture through his clivus.  I reviewed the patient's cervical CT performed [facility] today.  There is no obvious acute abnormalities.     Assessment/Plan: Large right subdural hematoma, occipital skull fracture, clivus fracture: The patient is comatose with bilateral blown pupils but by report he was spots of an answer questions after his fall.  There are no family members available to discuss this with.  He was taken to the OR emergently for a craniotomy under emergency implied consent. Ophelia Charter 09/05/2019, 2:50 PM

## 2019-09-05 NOTE — Discharge Planning (Signed)
RNCM responded to level 1 trauma.  RNCM following for disposition needs. Jason Skinner J. Clydene Laming, [place], [place], [place]

## 2019-09-05 NOTE — ED Notes (Signed)
4N notified of pt by Trauma RN

## 2019-09-05 NOTE — Anesthesia Procedure Notes (Signed)
Arterial Line Insertion Start/End6/30/2021 11:50 AM, 09/05/2019 11:50 AM Performed by: Lavell Luster, CRNA  Preanesthetic checklist: patient identified, IV checked, risks and benefits discussed, surgical consent, pre-op evaluation and timeout performed Patient sedated Right, radial was placed Catheter size: 20 G Hand hygiene performed , maximum sterile barriers used  and Seldinger technique used Allen's test indicative of satisfactory collateral circulation Attempts: 1 Procedure performed without using ultrasound guided technique. Following insertion, Biopatch and dressing applied. Post procedure assessment: normal  Patient tolerated the procedure well with no immediate complications.

## 2019-09-05 NOTE — Progress Notes (Signed)
Subjective: The patient is intubated and sedated and in no apparent distress.  His brother is at the bedside.  Objective: Vital signs in last 24 hours: Temp:  [96.2 F (35.7 C)-98.1 F (36.7 C)] 98.1 F (36.7 C) (06/30 1900) Pulse Rate:  [55-86] 65 (06/30 1900) Resp:  [15-24] 18 (06/30 1900) BP: (95-168)/(54-107) 111/68 (06/30 1900) SpO2:  [100 %] 100 % (06/30 1900) Arterial Line BP: (127-144)/(53-58) 144/58 (06/30 1900) FiO2 (%):  [40 %-100 %] 40 % (06/30 1600) Weight:  [65 kg-77 kg] 77 kg (06/30 1630) Estimated body mass index is 24.36 kg/m as calculated from the following:   Height as of this encounter: 5\' 10"  (1.778 m).   Weight as of this encounter: 77 kg.   Intake/Output from previous day: No intake/output data recorded. Intake/Output this shift: No intake/output data recorded.  Physical exam scale coma scale 6, intubated, [id number].  The patient almost localizes on the left to painful stimuli.  He abnormal reflexes on the right.  His left pupil is approximately 2 mm and appears reactive.  His right pupil was 5 mm, irregular and reactive/sluggish.  His scalp flap is soft.  His ventriculostomy does not appear to be patent.  Lab Results: Recent Labs    09/05/19 1112 09/05/19 1112 09/05/19 1116 09/05/19 1206  WBC 19.1*  --   --   --   HGB 14.6   < > 15.0 12.9*  HCT 43.3   < > 44.0 38.0*  PLT 284  --   --   --    < > = values in this interval not displayed.   BMET Recent Labs    09/05/19 1112 09/05/19 1112 09/05/19 1116 09/05/19 1116 09/05/19 1206 09/05/19 1649  NA 142   < > 143   < > 141 141  K 3.1*   < > 2.8*   < > 2.6* 3.4*  CL 109   < > 106  --   --  112*  CO2 20*  --   --   --   --  21*  GLUCOSE 166*   < > 169*  --   --  138*  BUN 8   < > 8  --   --  5*  CREATININE 0.84   < > 0.80  --   --  0.63  CALCIUM 8.7*  --   --   --   --  7.5*   < > = values in this interval not displayed.    Studies/Results: DG ELBOW COMPLETE RIGHT (3+VIEW)  Result  Date: 09/05/2019 CLINICAL DATA:  Pain status post fall EXAM: RIGHT ELBOW - COMPLETE 3+ VIEW COMPARISON:  None. FINDINGS: There is no evidence of fracture, dislocation, or joint effusion. There is no evidence of arthropathy or other focal bone abnormality. Soft tissues are unremarkable. IMPRESSION: Negative. Electronically Signed   By: Constance Holster M.D.   On: 09/05/2019 14:53   DG Abd 1 View  Result Date: 09/05/2019 CLINICAL DATA:  Pain. EXAM: ABDOMEN - 1 VIEW COMPARISON:  CT from the same day FINDINGS: The enteric tube projects over the stomach. The visualized bowel gas pattern is unremarkable. IMPRESSION: Enteric tube projects over the stomach. Electronically Signed   By: Constance Holster M.D.   On: 09/05/2019 14:54   CT HEAD WO CONTRAST  Result Date: 09/05/2019 CLINICAL DATA:  Level 1 trauma EXAM: CT HEAD WITHOUT CONTRAST CT MAXILLOFACIAL WITHOUT CONTRAST CT CERVICAL SPINE WITHOUT CONTRAST TECHNIQUE: Multidetector CT imaging of the head, cervical spine,  and maxillofacial structures were performed using the standard protocol without intravenous contrast. Multiplanar CT image reconstructions of the cervical spine and maxillofacial structures were also generated. COMPARISON:  None. FINDINGS: CT HEAD FINDINGS Brain: Mixed density subdural hematoma along the right cerebral convexity and left frontal pole. Mixed density could be from a continued and un clotted bleeding, especially on the right. The right-sided hematoma measures up to 15 mm in thickness. Midline shift measures up to 2 cm. Pneumocephalus with intraventricular extension. Right uncal herniation. Mix of contusion and subarachnoid hemorrhage over the left temporal pole which is edematous. No visible infarct. Vascular: Negative Skull: Midline skull fracture involving the occiput and clivus. There is extension into the right sphenoid sinus, which accounts for pneumocephalus and sinus opacification. Posterior scalp contusion CT MAXILLOFACIAL  FINDINGS Osseous: Intact and located mandible. Midline clivus fracture extending to the right sphenoid sinus. Orbits: No evidence of injury. Sinuses: Right sphenoid and maxillary hemosinus Soft tissues: Negative CT CERVICAL SPINE FINDINGS Alignment: Normal Skull base and vertebrae: Clival fracture. No cervical spine fracture. Soft tissues and spinal canal: Gas present around the dens. The tectorial membrane is continuous and visible. No visible canal hematoma Disc levels:  No degenerative changes or visible impingement. Upper chest: Reported separately Critical Value/emergent results were discussed in person on 09/05/2019 at 11:35 am with Dr Bobbye Morton. IMPRESSION: Head CT: 1. Right more than left cerebral convexity subdural hematoma with leftward midline shift of 2 cm and right uncal herniation. 2. Left temporal pole contusion with overlying subarachnoid hemorrhage. 3. Occipital and clival midline fractures extending into the right sphenoid sinus with pneumocephalus. Cervical spine CT: Gas around the dens presumably related to the skull base fracture. Craniocervical alignment is normal and the tectorial membrane is visible. Alar or other ligamentous injury is still conceivable. Maxillofacial CT: Clival fracture extending into the right sphenoid sinus with hemosinus and pneumocephalus. Electronically Signed   By: Monte Fantasia M.D.   On: 09/05/2019 11:40   CT CHEST W CONTRAST  Result Date: 09/05/2019 CLINICAL DATA:  Moderate to severe chest trauma EXAM: CT CHEST, ABDOMEN, AND PELVIS WITH CONTRAST TECHNIQUE: Multidetector CT imaging of the chest, abdomen and pelvis was performed following the standard protocol during bolus administration of intravenous contrast. CONTRAST:  Dose is currently not available COMPARISON:  None. FINDINGS: CT CHEST FINDINGS Cardiovascular: Normal heart size. No pericardial effusion. No evidence of great vessel injury. Mediastinum/Nodes: No hematoma or pneumomediastinum. There is a coarse  calcification in the right lobe of the thyroid which is asymmetrically larger with a 2 cm nodular projection extending inferiorly that is isodense to the remaining gland. Lungs/Pleura: No hemothorax, pneumothorax, or lung contusion. Dependent opacity with volume loss is likely atelectasis and aspiration. Musculoskeletal: No acute finding CT ABDOMEN PELVIS FINDINGS Hepatobiliary: No hepatic injury or perihepatic hematoma. Gallbladder is unremarkable Pancreas: Negative Spleen: Left upper quadrant poly splenia or localize splenosis. No acute injury. Adrenals/Urinary Tract: No adrenal hemorrhage or renal injury identified. Bladder is unremarkable. Stomach/Bowel: No evidence of injury Vascular/Lymphatic: No acute vascular finding Reproductive: Negative Other: No ascites or pneumoperitoneum. Musculoskeletal: L1 and L2 right transverse process fractures. Vacuum phenomenon around the sacroiliac joints without diastasis IMPRESSION: 1. L1 and L2 right transverse process fractures. 2. Bilateral dependent atelectasis and or aspiration. 3. 2 cm nodular projection from the right thyroid lobe. After convalescence, recommend thyroid US(Ref: J Am Coll Radiol. 2015 Feb;12(2): 143-50). Electronically Signed   By: Monte Fantasia M.D.   On: 09/05/2019 11:45   CT CERVICAL SPINE WO CONTRAST  Result Date: 09/05/2019 CLINICAL DATA:  Level 1 trauma EXAM: CT HEAD WITHOUT CONTRAST CT MAXILLOFACIAL WITHOUT CONTRAST CT CERVICAL SPINE WITHOUT CONTRAST TECHNIQUE: Multidetector CT imaging of the head, cervical spine, and maxillofacial structures were performed using the standard protocol without intravenous contrast. Multiplanar CT image reconstructions of the cervical spine and maxillofacial structures were also generated. COMPARISON:  None. FINDINGS: CT HEAD FINDINGS Brain: Mixed density subdural hematoma along the right cerebral convexity and left frontal pole. Mixed density could be from a continued and un clotted bleeding, especially on  the right. The right-sided hematoma measures up to 15 mm in thickness. Midline shift measures up to 2 cm. Pneumocephalus with intraventricular extension. Right uncal herniation. Mix of contusion and subarachnoid hemorrhage over the left temporal pole which is edematous. No visible infarct. Vascular: Negative Skull: Midline skull fracture involving the occiput and clivus. There is extension into the right sphenoid sinus, which accounts for pneumocephalus and sinus opacification. Posterior scalp contusion CT MAXILLOFACIAL FINDINGS Osseous: Intact and located mandible. Midline clivus fracture extending to the right sphenoid sinus. Orbits: No evidence of injury. Sinuses: Right sphenoid and maxillary hemosinus Soft tissues: Negative CT CERVICAL SPINE FINDINGS Alignment: Normal Skull base and vertebrae: Clival fracture. No cervical spine fracture. Soft tissues and spinal canal: Gas present around the dens. The tectorial membrane is continuous and visible. No visible canal hematoma Disc levels:  No degenerative changes or visible impingement. Upper chest: Reported separately Critical Value/emergent results were discussed in person on 09/05/2019 at 11:35 am with Dr Bobbye Morton. IMPRESSION: Head CT: 1. Right more than left cerebral convexity subdural hematoma with leftward midline shift of 2 cm and right uncal herniation. 2. Left temporal pole contusion with overlying subarachnoid hemorrhage. 3. Occipital and clival midline fractures extending into the right sphenoid sinus with pneumocephalus. Cervical spine CT: Gas around the dens presumably related to the skull base fracture. Craniocervical alignment is normal and the tectorial membrane is visible. Alar or other ligamentous injury is still conceivable. Maxillofacial CT: Clival fracture extending into the right sphenoid sinus with hemosinus and pneumocephalus. Electronically Signed   By: Monte Fantasia M.D.   On: 09/05/2019 11:40   CT ABDOMEN PELVIS W CONTRAST  Result Date:  09/05/2019 CLINICAL DATA:  Moderate to severe chest trauma EXAM: CT CHEST, ABDOMEN, AND PELVIS WITH CONTRAST TECHNIQUE: Multidetector CT imaging of the chest, abdomen and pelvis was performed following the standard protocol during bolus administration of intravenous contrast. CONTRAST:  Dose is currently not available COMPARISON:  None. FINDINGS: CT CHEST FINDINGS Cardiovascular: Normal heart size. No pericardial effusion. No evidence of great vessel injury. Mediastinum/Nodes: No hematoma or pneumomediastinum. There is a coarse calcification in the right lobe of the thyroid which is asymmetrically larger with a 2 cm nodular projection extending inferiorly that is isodense to the remaining gland. Lungs/Pleura: No hemothorax, pneumothorax, or lung contusion. Dependent opacity with volume loss is likely atelectasis and aspiration. Musculoskeletal: No acute finding CT ABDOMEN PELVIS FINDINGS Hepatobiliary: No hepatic injury or perihepatic hematoma. Gallbladder is unremarkable Pancreas: Negative Spleen: Left upper quadrant poly splenia or localize splenosis. No acute injury. Adrenals/Urinary Tract: No adrenal hemorrhage or renal injury identified. Bladder is unremarkable. Stomach/Bowel: No evidence of injury Vascular/Lymphatic: No acute vascular finding Reproductive: Negative Other: No ascites or pneumoperitoneum. Musculoskeletal: L1 and L2 right transverse process fractures. Vacuum phenomenon around the sacroiliac joints without diastasis IMPRESSION: 1. L1 and L2 right transverse process fractures. 2. Bilateral dependent atelectasis and or aspiration. 3. 2 cm nodular projection from the right thyroid lobe.  After convalescence, recommend thyroid US(Ref: J Am Coll Radiol. 2015 Feb;12(2): 143-50). Electronically Signed   By: Monte Fantasia M.D.   On: 09/05/2019 11:45   DG Pelvis Portable  Result Date: 09/05/2019 CLINICAL DATA:  Level 1 trauma. Fell 20 feet. EXAM: PORTABLE PELVIS 1-2 VIEWS COMPARISON:  None. FINDINGS:  Both hips are normally located. No hip fracture. The pubic symphysis and SI joints are intact. No definite pelvic fractures. IMPRESSION: No acute bony findings. Electronically Signed   By: Marijo Sanes M.D.   On: 09/05/2019 11:22   DG Chest Port 1 View  Result Date: 09/05/2019 CLINICAL DATA:  Level 1 trauma. Fell 20 feet. EXAM: PORTABLE CHEST 1 VIEW COMPARISON:  None. FINDINGS: The endotracheal tube is in good position, 3.8 cm above the carina. The cardiac silhouette, mediastinal and hilar contours are within normal limits. The lungs are clear. No pleural effusion or pneumothorax. The bony thorax is grossly intact. No obvious acute rib fractures. Remote healed mid left clavicle fracture is noted. IMPRESSION: 1. Endotracheal tube in good position, 3.8 cm above the carina. 2. No acute cardiopulmonary findings. Electronically Signed   By: Marijo Sanes M.D.   On: 09/05/2019 11:22   CT MAXILLOFACIAL WO CONTRAST  Result Date: 09/05/2019 CLINICAL DATA:  Level 1 trauma EXAM: CT HEAD WITHOUT CONTRAST CT MAXILLOFACIAL WITHOUT CONTRAST CT CERVICAL SPINE WITHOUT CONTRAST TECHNIQUE: Multidetector CT imaging of the head, cervical spine, and maxillofacial structures were performed using the standard protocol without intravenous contrast. Multiplanar CT image reconstructions of the cervical spine and maxillofacial structures were also generated. COMPARISON:  None. FINDINGS: CT HEAD FINDINGS Brain: Mixed density subdural hematoma along the right cerebral convexity and left frontal pole. Mixed density could be from a continued and un clotted bleeding, especially on the right. The right-sided hematoma measures up to 15 mm in thickness. Midline shift measures up to 2 cm. Pneumocephalus with intraventricular extension. Right uncal herniation. Mix of contusion and subarachnoid hemorrhage over the left temporal pole which is edematous. No visible infarct. Vascular: Negative Skull: Midline skull fracture involving the occiput and  clivus. There is extension into the right sphenoid sinus, which accounts for pneumocephalus and sinus opacification. Posterior scalp contusion CT MAXILLOFACIAL FINDINGS Osseous: Intact and located mandible. Midline clivus fracture extending to the right sphenoid sinus. Orbits: No evidence of injury. Sinuses: Right sphenoid and maxillary hemosinus Soft tissues: Negative CT CERVICAL SPINE FINDINGS Alignment: Normal Skull base and vertebrae: Clival fracture. No cervical spine fracture. Soft tissues and spinal canal: Gas present around the dens. The tectorial membrane is continuous and visible. No visible canal hematoma Disc levels:  No degenerative changes or visible impingement. Upper chest: Reported separately Critical Value/emergent results were discussed in person on 09/05/2019 at 11:35 am with Dr Bobbye Morton. IMPRESSION: Head CT: 1. Right more than left cerebral convexity subdural hematoma with leftward midline shift of 2 cm and right uncal herniation. 2. Left temporal pole contusion with overlying subarachnoid hemorrhage. 3. Occipital and clival midline fractures extending into the right sphenoid sinus with pneumocephalus. Cervical spine CT: Gas around the dens presumably related to the skull base fracture. Craniocervical alignment is normal and the tectorial membrane is visible. Alar or other ligamentous injury is still conceivable. Maxillofacial CT: Clival fracture extending into the right sphenoid sinus with hemosinus and pneumocephalus. Electronically Signed   By: Monte Fantasia M.D.   On: 09/05/2019 11:40    Assessment/Plan: Status post brain ectomy and evacuation of subdural hematoma: The patient has improved since surgery, but his prognosis  remains poor.  We will continue supportive care.  I spoke with his brother via the interpreter.  LOS: 0 days     Ophelia Charter 09/05/2019, 7:45 PM

## 2019-09-05 NOTE — ED Notes (Signed)
To ct

## 2019-09-05 NOTE — Op Note (Signed)
Brief history: The patient is a 29 year old Hispanic male who by report felt 25 feet from a ladder striking his head.  He became unconscious and was brought to the [facility], ER.  He was intubated and a head CT was obtained which demonstrated a large right subdural hematoma, occipital skull fracture, and clivus fracture.  There are no family members available.  He is taken to the OR for emergency surgery under implied consent.  Preop diagnosis: Right subdural hematoma, occipital skull fracture, clivus fracture, traumatic brain injury  Postop diagnosis: The same  Procedure: Right frontotemporoparietal craniectomy for evacuation of acute subdural hematoma; insertion of right frontal ventriculostomy; insertion of craniotomy flap into the abdominal subcutaneous tissue  Surgeon: Dr. Earle Gell  Assistant: Arnetha Massy, NP  Anesthesia: General tracheal  Estimated blood loss: 200 cc  Specimens: None  Drains: two 10 mm flat Jackson-Pratt drains in the epidural space  Complications: None  Description of procedure: The patient was brought to the operating room by the anesthesia team.  General endotracheal anesthesia was induced.  I applied the Mayfield three-point headrest to the patient's calvarium.  A roll was placed under his right shoulder.  His head was turned to the left exposing his right scalp.  His right scalp was then shaved with clippers and prepared with Betadine scrub and Betadine solution.  Sterile drapes were applied.  I then injected the area to be incised with Marcaine with epinephrine solution.  I used a scalpel to make a reverse question mark type incision in the patient's right temporal, parietal and frontal region.  I used Raney clips for wound edge hemostasis.  I then used the electrocautery to divide the temporalis fascia and muscle.  I used the periosteal elevators to expose the underlying calvarium.  We used the fishhooks and rubber bands for exposure.  I then used a  high-speed drill to create a right temporal bur hole and a bur hole in the right keyhole region.  I used the footplate device to create a large right frontal temporal parietal craniotomy flap.  We elevated the craniotomy flap with the periosteal elevator and the Penfield #1.  This exposed the underlying dura which was tense.  I incised the dura with the Metzenbaum scissors.  This exposed the large acute subdural hematoma.  We evacuated the hematoma with suction and irrigation.  We encountered several superficial cortical bleeding points in the right frontal and right temporal region.  We used bipolar electrocautery to coagulate these bleeding points.  We then irrigated the subdural space with saline solution.  We did not see any further bleeding.  I now turned my attention to the placement of the ventriculostomy.  We coagulated a small area in the right frontal lobe and created a small corticotomy with the scalpel.  We inserted the ventriculostomy into the ventricular system.  We got some spinal fluid back but it was under low pressure.  We tunneled the ventriculostomy out through a separate stab wound.  I secured the ventriculostomy at the exit site with multiple nylon sutures.  I connected the ventriculostomy to the drainage system and we secured the connections with sutures we then laid the patient's dura over the exposed brain but did not sutured into place.  We placed two 10 mm flat Jackson-Pratt drains in the epidural space and tunneled them out through separate stab wounds..  We reapproximated the patient's galea with interrupted 2-0 Vicryl suture.  We have approximated the skin with stainless steel staples.  The patient's  abdominal region was previously prepped with Betadine prep and Betadine solution.  I made an incision in the patient's right upper quadrant of his abdomen.  We used electrocautery to create a subcutaneous pocket which we inserted the large craniotomy flap in place.  We then  reapproximated patient's subcutaneous tissue with interrupted 2-0 Vicryl suture.  We reapproximate the skin with Steri-Strips and benzoin.  The patient's wounds were then coated with bacitracin ointment.  Sterile dressings were applied.  We then removed the patient's drapes.  I then remove the Mayfield three-point headrest from the patient's calvarium.  By report all sponge, instrument, and needle counts were correct at the end of this case.

## 2019-09-05 NOTE — ED Notes (Signed)
Dr Arnetha Massy with neurosurgery called.

## 2019-09-05 NOTE — Progress Notes (Signed)
Orthopedic Tech Progress Note Patient Details:  Jason Skinner 08-31-1990 [id number] Level 2 Trauma upgraded to level 1. Not needed at the moment. Patient ID: Jason Skinner, male   DOB: [birth year], 29 y.o.   MRN: [id number]   Jason Skinner 09/05/2019, 11:24 AM

## 2019-09-05 NOTE — Transfer of Care (Signed)
Immediate Anesthesia Transfer of Care Note  Patient: Cristen Murcia  Procedure(s) Performed: CRANIOTOMY HEMATOMA EVACUATION SUBDURAL WITH  PLACEMENT OF BONE FLAP IN ABDOMEN. (Right Head)  Patient Location: ICU  Anesthesia Type:General  Level of Consciousness: sedated and Patient remains intubated per anesthesia plan  Airway & Oxygen Therapy: Patient remains intubated per anesthesia plan and Patient placed on Ventilator (see vital sign flow sheet for setting)  Post-op Assessment: Report given to RN and Post -op Vital signs reviewed and stable  Post vital signs: Reviewed and stable  Last Vitals:  Vitals Value Taken Time  BP 168/76 09/05/19 1425  Temp    Pulse 74 09/05/19 1425  Resp 14 09/05/19 1425  SpO2 100 % 09/05/19 1425  Vitals shown include unvalidated device data.  Last Pain:  Vitals:   09/05/19 1105  TempSrc: Tympanic         Complications: No complications documented.

## 2019-09-05 NOTE — ED Provider Notes (Signed)
[place] EMERGENCY DEPARTMENT Provider Note   CSN: [id number] Arrival date & time: 09/05/19  1058     History No chief complaint on file.   Jason Skinner is a 29 y.o. male. Level 5 caveat due to unresponsiveness. HPI Patient came in after fall off a ladder.  Reported he is a [employer].  Really unknown medical history but reportedly not on medicines.  Speaks Spanish primarily and had been speaking with EMS but then became less responsive.  Has blown pupil on right.  Had been activated as a level 1 trauma prior to arrival to hospital.    No past medical history on file.  Patient Active Problem List   Diagnosis Date Noted  . SDH (subdural hematoma) ([place]) 09/05/2019        No family history on file.  Social History   Tobacco Use  . Smoking status: Not on file  Substance Use Topics  . Alcohol use: Not on file  . Drug use: Not on file    Home Medications Prior to Admission medications   Not on File    Allergies    Patient has no allergy information on record.  Review of Systems   Review of Systems  Unable to perform ROS: Mental status change    Physical Exam Updated Vital Signs BP 132/82   Pulse (!) 55   Temp (!) 96.2 F (35.7 C) (Tympanic)   Resp 18   Ht '5\' 10"'  (1.778 m)   Wt 65 kg   SpO2 100%   BMI 20.56 kg/m   Physical Exam Vitals and nursing note reviewed.  Constitutional:      Comments: Unresponsive, breathing spontaneously.  HENT:     Head:     Comments: Some blood on face.  Right pupil dilated and left normal.  Blood on scalp, but not clear from where. Neck:     Comments: Cervical collar in place with no midline deformity. Pulmonary:     Breath sounds: No wheezing or rhonchi.     Comments: No chest deformity.  Equal breath sounds bilaterally. Abdominal:     Palpations: There is no mass.     Tenderness: There is no abdominal tenderness.  Musculoskeletal:     Comments: Muscular swelling right proximal forearm.  No clear  underlying bony deformity.  Skin:    General: Skin is warm.  Neurological:     Comments: Breathing spontaneously.  Nonverbal.  Moving extremities potentially localizing pain.     ED Results / Procedures / Treatments   Labs (all labs ordered are listed, but only abnormal results are displayed) Labs Reviewed  COMPREHENSIVE METABOLIC PANEL - Abnormal; Notable for the following components:      Result Value   Potassium 3.1 (*)    CO2 20 (*)    Glucose, Bld 166 (*)    Calcium 8.7 (*)    All other components within normal limits  CBC - Abnormal; Notable for the following components:   WBC 19.1 (*)    All other components within normal limits  LACTIC ACID, PLASMA - Abnormal; Notable for the following components:   Lactic Acid, Venous 3.1 (*)    All other components within normal limits  I-STAT CHEM 8, ED - Abnormal; Notable for the following components:   Potassium 2.8 (*)    Glucose, Bld 169 (*)    Calcium, Ion 1.07 (*)    All other components within normal limits  SARS CORONAVIRUS 2 BY RT PCR (HOSPITAL ORDER, PERFORMED  [facility])  ETHANOL  PROTIME-INR  URINALYSIS, ROUTINE W REFLEX MICROSCOPIC  HIV ANTIBODY (ROUTINE TESTING W REFLEX)  SAMPLE TO BLOOD BANK  PREPARE RBC (CROSSMATCH)  TYPE AND SCREEN  ABO/RH    EKG None  Radiology CT HEAD WO CONTRAST  Result Date: 09/05/2019 CLINICAL DATA:  Level 1 trauma EXAM: CT HEAD WITHOUT CONTRAST CT MAXILLOFACIAL WITHOUT CONTRAST CT CERVICAL SPINE WITHOUT CONTRAST TECHNIQUE: Multidetector CT imaging of the head, cervical spine, and maxillofacial structures were performed using the standard protocol without intravenous contrast. Multiplanar CT image reconstructions of the cervical spine and maxillofacial structures were also generated. COMPARISON:  None. FINDINGS: CT HEAD FINDINGS Brain: Mixed density subdural hematoma along the right cerebral convexity and left frontal pole. Mixed density could be from a continued and un  clotted bleeding, especially on the right. The right-sided hematoma measures up to 15 mm in thickness. Midline shift measures up to 2 cm. Pneumocephalus with intraventricular extension. Right uncal herniation. Mix of contusion and subarachnoid hemorrhage over the left temporal pole which is edematous. No visible infarct. Vascular: Negative Skull: Midline skull fracture involving the occiput and clivus. There is extension into the right sphenoid sinus, which accounts for pneumocephalus and sinus opacification. Posterior scalp contusion CT MAXILLOFACIAL FINDINGS Osseous: Intact and located mandible. Midline clivus fracture extending to the right sphenoid sinus. Orbits: No evidence of injury. Sinuses: Right sphenoid and maxillary hemosinus Soft tissues: Negative CT CERVICAL SPINE FINDINGS Alignment: Normal Skull base and vertebrae: Clival fracture. No cervical spine fracture. Soft tissues and spinal canal: Gas present around the dens. The tectorial membrane is continuous and visible. No visible canal hematoma Disc levels:  No degenerative changes or visible impingement. Upper chest: Reported separately Critical Value/emergent results were discussed in person on 09/05/2019 at 11:35 am with Dr Bobbye Morton. IMPRESSION: Head CT: 1. Right more than left cerebral convexity subdural hematoma with leftward midline shift of 2 cm and right uncal herniation. 2. Left temporal pole contusion with overlying subarachnoid hemorrhage. 3. Occipital and clival midline fractures extending into the right sphenoid sinus with pneumocephalus. Cervical spine CT: Gas around the dens presumably related to the skull base fracture. Craniocervical alignment is normal and the tectorial membrane is visible. Alar or other ligamentous injury is still conceivable. Maxillofacial CT: Clival fracture extending into the right sphenoid sinus with hemosinus and pneumocephalus. Electronically Signed   By: Monte Fantasia M.D.   On: 09/05/2019 11:40   CT CHEST W  CONTRAST  Result Date: 09/05/2019 CLINICAL DATA:  Moderate to severe chest trauma EXAM: CT CHEST, ABDOMEN, AND PELVIS WITH CONTRAST TECHNIQUE: Multidetector CT imaging of the chest, abdomen and pelvis was performed following the standard protocol during bolus administration of intravenous contrast. CONTRAST:  Dose is currently not available COMPARISON:  None. FINDINGS: CT CHEST FINDINGS Cardiovascular: Normal heart size. No pericardial effusion. No evidence of great vessel injury. Mediastinum/Nodes: No hematoma or pneumomediastinum. There is a coarse calcification in the right lobe of the thyroid which is asymmetrically larger with a 2 cm nodular projection extending inferiorly that is isodense to the remaining gland. Lungs/Pleura: No hemothorax, pneumothorax, or lung contusion. Dependent opacity with volume loss is likely atelectasis and aspiration. Musculoskeletal: No acute finding CT ABDOMEN PELVIS FINDINGS Hepatobiliary: No hepatic injury or perihepatic hematoma. Gallbladder is unremarkable Pancreas: Negative Spleen: Left upper quadrant poly splenia or localize splenosis. No acute injury. Adrenals/Urinary Tract: No adrenal hemorrhage or renal injury identified. Bladder is unremarkable. Stomach/Bowel: No evidence of injury Vascular/Lymphatic: No acute vascular finding Reproductive:  Negative Other: No ascites or pneumoperitoneum. Musculoskeletal: L1 and L2 right transverse process fractures. Vacuum phenomenon around the sacroiliac joints without diastasis IMPRESSION: 1. L1 and L2 right transverse process fractures. 2. Bilateral dependent atelectasis and or aspiration. 3. 2 cm nodular projection from the right thyroid lobe. After convalescence, recommend thyroid US(Ref: J Am Coll Radiol. 2015 Feb;12(2): 143-50). Electronically Signed   By: Monte Fantasia M.D.   On: 09/05/2019 11:45   CT CERVICAL SPINE WO CONTRAST  Result Date: 09/05/2019 CLINICAL DATA:  Level 1 trauma EXAM: CT HEAD WITHOUT CONTRAST CT  MAXILLOFACIAL WITHOUT CONTRAST CT CERVICAL SPINE WITHOUT CONTRAST TECHNIQUE: Multidetector CT imaging of the head, cervical spine, and maxillofacial structures were performed using the standard protocol without intravenous contrast. Multiplanar CT image reconstructions of the cervical spine and maxillofacial structures were also generated. COMPARISON:  None. FINDINGS: CT HEAD FINDINGS Brain: Mixed density subdural hematoma along the right cerebral convexity and left frontal pole. Mixed density could be from a continued and un clotted bleeding, especially on the right. The right-sided hematoma measures up to 15 mm in thickness. Midline shift measures up to 2 cm. Pneumocephalus with intraventricular extension. Right uncal herniation. Mix of contusion and subarachnoid hemorrhage over the left temporal pole which is edematous. No visible infarct. Vascular: Negative Skull: Midline skull fracture involving the occiput and clivus. There is extension into the right sphenoid sinus, which accounts for pneumocephalus and sinus opacification. Posterior scalp contusion CT MAXILLOFACIAL FINDINGS Osseous: Intact and located mandible. Midline clivus fracture extending to the right sphenoid sinus. Orbits: No evidence of injury. Sinuses: Right sphenoid and maxillary hemosinus Soft tissues: Negative CT CERVICAL SPINE FINDINGS Alignment: Normal Skull base and vertebrae: Clival fracture. No cervical spine fracture. Soft tissues and spinal canal: Gas present around the dens. The tectorial membrane is continuous and visible. No visible canal hematoma Disc levels:  No degenerative changes or visible impingement. Upper chest: Reported separately Critical Value/emergent results were discussed in person on 09/05/2019 at 11:35 am with Dr Bobbye Morton. IMPRESSION: Head CT: 1. Right more than left cerebral convexity subdural hematoma with leftward midline shift of 2 cm and right uncal herniation. 2. Left temporal pole contusion with overlying  subarachnoid hemorrhage. 3. Occipital and clival midline fractures extending into the right sphenoid sinus with pneumocephalus. Cervical spine CT: Gas around the dens presumably related to the skull base fracture. Craniocervical alignment is normal and the tectorial membrane is visible. Alar or other ligamentous injury is still conceivable. Maxillofacial CT: Clival fracture extending into the right sphenoid sinus with hemosinus and pneumocephalus. Electronically Signed   By: Monte Fantasia M.D.   On: 09/05/2019 11:40   CT ABDOMEN PELVIS W CONTRAST  Result Date: 09/05/2019 CLINICAL DATA:  Moderate to severe chest trauma EXAM: CT CHEST, ABDOMEN, AND PELVIS WITH CONTRAST TECHNIQUE: Multidetector CT imaging of the chest, abdomen and pelvis was performed following the standard protocol during bolus administration of intravenous contrast. CONTRAST:  Dose is currently not available COMPARISON:  None. FINDINGS: CT CHEST FINDINGS Cardiovascular: Normal heart size. No pericardial effusion. No evidence of great vessel injury. Mediastinum/Nodes: No hematoma or pneumomediastinum. There is a coarse calcification in the right lobe of the thyroid which is asymmetrically larger with a 2 cm nodular projection extending inferiorly that is isodense to the remaining gland. Lungs/Pleura: No hemothorax, pneumothorax, or lung contusion. Dependent opacity with volume loss is likely atelectasis and aspiration. Musculoskeletal: No acute finding CT ABDOMEN PELVIS FINDINGS Hepatobiliary: No hepatic injury or perihepatic hematoma. Gallbladder is unremarkable Pancreas: Negative Spleen:  Left upper quadrant poly splenia or localize splenosis. No acute injury. Adrenals/Urinary Tract: No adrenal hemorrhage or renal injury identified. Bladder is unremarkable. Stomach/Bowel: No evidence of injury Vascular/Lymphatic: No acute vascular finding Reproductive: Negative Other: No ascites or pneumoperitoneum. Musculoskeletal: L1 and L2 right transverse  process fractures. Vacuum phenomenon around the sacroiliac joints without diastasis IMPRESSION: 1. L1 and L2 right transverse process fractures. 2. Bilateral dependent atelectasis and or aspiration. 3. 2 cm nodular projection from the right thyroid lobe. After convalescence, recommend thyroid US(Ref: J Am Coll Radiol. 2015 Feb;12(2): 143-50). Electronically Signed   By: Monte Fantasia M.D.   On: 09/05/2019 11:45   DG Pelvis Portable  Result Date: 09/05/2019 CLINICAL DATA:  Level 1 trauma. Fell 20 feet. EXAM: PORTABLE PELVIS 1-2 VIEWS COMPARISON:  None. FINDINGS: Both hips are normally located. No hip fracture. The pubic symphysis and SI joints are intact. No definite pelvic fractures. IMPRESSION: No acute bony findings. Electronically Signed   By: Marijo Sanes M.D.   On: 09/05/2019 11:22   DG Chest Port 1 View  Result Date: 09/05/2019 CLINICAL DATA:  Level 1 trauma. Fell 20 feet. EXAM: PORTABLE CHEST 1 VIEW COMPARISON:  None. FINDINGS: The endotracheal tube is in good position, 3.8 cm above the carina. The cardiac silhouette, mediastinal and hilar contours are within normal limits. The lungs are clear. No pleural effusion or pneumothorax. The bony thorax is grossly intact. No obvious acute rib fractures. Remote healed mid left clavicle fracture is noted. IMPRESSION: 1. Endotracheal tube in good position, 3.8 cm above the carina. 2. No acute cardiopulmonary findings. Electronically Signed   By: Marijo Sanes M.D.   On: 09/05/2019 11:22   CT MAXILLOFACIAL WO CONTRAST  Result Date: 09/05/2019 CLINICAL DATA:  Level 1 trauma EXAM: CT HEAD WITHOUT CONTRAST CT MAXILLOFACIAL WITHOUT CONTRAST CT CERVICAL SPINE WITHOUT CONTRAST TECHNIQUE: Multidetector CT imaging of the head, cervical spine, and maxillofacial structures were performed using the standard protocol without intravenous contrast. Multiplanar CT image reconstructions of the cervical spine and maxillofacial structures were also generated. COMPARISON:   None. FINDINGS: CT HEAD FINDINGS Brain: Mixed density subdural hematoma along the right cerebral convexity and left frontal pole. Mixed density could be from a continued and un clotted bleeding, especially on the right. The right-sided hematoma measures up to 15 mm in thickness. Midline shift measures up to 2 cm. Pneumocephalus with intraventricular extension. Right uncal herniation. Mix of contusion and subarachnoid hemorrhage over the left temporal pole which is edematous. No visible infarct. Vascular: Negative Skull: Midline skull fracture involving the occiput and clivus. There is extension into the right sphenoid sinus, which accounts for pneumocephalus and sinus opacification. Posterior scalp contusion CT MAXILLOFACIAL FINDINGS Osseous: Intact and located mandible. Midline clivus fracture extending to the right sphenoid sinus. Orbits: No evidence of injury. Sinuses: Right sphenoid and maxillary hemosinus Soft tissues: Negative CT CERVICAL SPINE FINDINGS Alignment: Normal Skull base and vertebrae: Clival fracture. No cervical spine fracture. Soft tissues and spinal canal: Gas present around the dens. The tectorial membrane is continuous and visible. No visible canal hematoma Disc levels:  No degenerative changes or visible impingement. Upper chest: Reported separately Critical Value/emergent results were discussed in person on 09/05/2019 at 11:35 am with Dr Bobbye Morton. IMPRESSION: Head CT: 1. Right more than left cerebral convexity subdural hematoma with leftward midline shift of 2 cm and right uncal herniation. 2. Left temporal pole contusion with overlying subarachnoid hemorrhage. 3. Occipital and clival midline fractures extending into the right sphenoid sinus with pneumocephalus.  Cervical spine CT: Gas around the dens presumably related to the skull base fracture. Craniocervical alignment is normal and the tectorial membrane is visible. Alar or other ligamentous injury is still conceivable. Maxillofacial CT:  Clival fracture extending into the right sphenoid sinus with hemosinus and pneumocephalus. Electronically Signed   By: Monte Fantasia M.D.   On: 09/05/2019 11:40    Procedures Procedures (including critical care time)  Medications Ordered in ED Medications  propofol (DIPRIVAN) 1000 MG/100ML infusion (20 mcg/kg/min  65 kg Intravenous Restarted 09/05/19 1346)  0.9 %  sodium chloride infusion (Manually program via Guardrails IV Fluids) ( Intravenous MAR Hold 09/05/19 1413)  fentaNYL 2534mg in NS 2578m(1029mml) infusion-PREMIX (has no administration in time range)  0.9 %  sodium chloride infusion ( Intravenous New Bag/Given 09/05/19 1141)  acetaminophen (TYLENOL) tablet 650 mg ( Oral MAR Hold 09/05/19 1413)  acetaminophen (TYLENOL) suppository 650 mg ( Rectal MAR Hold 09/05/19 1413)  ondansetron (ZOFRAN-ODT) disintegrating tablet 4 mg ( Oral MAR Hold 09/05/19 1413)    Or  ondansetron (ZOFRAN) injection 4 mg ( Intravenous MAR Hold 09/05/19 1413)  pantoprazole (PROTONIX) EC tablet 40 mg ( Oral Automatically Held 09/13/19 1000)    Or  pantoprazole (PROTONIX) injection 40 mg ( Intravenous Automatically Held 09/13/19 1000)  metoprolol tartrate (LOPRESSOR) injection 5 mg ( Intravenous MAR Hold 09/05/19 1413)  potassium chloride 10 mEq in 100 mL IVPB ( Intravenous Automatically Held 09/05/19 1630)  levETIRAcetam (KEPPRA) IVPB 500 mg/100 mL premix ( Intravenous Automatically Held 09/12/19 1000)  etomidate (AMIDATE) injection (20 mg Intravenous Given 09/05/19 1102)  rocuronium (ZEMURON) injection (70 mg Intravenous Given 09/05/19 1103)  levETIRAcetam (KEPPRA) IVPB 1000 mg/100 mL premix (1,000 mg Intravenous New Bag/Given 09/05/19 1129)  iohexol (OMNIPAQUE) 350 MG/ML injection 100 mL (100 mLs Intravenous Contrast Given 09/05/19 1256)    ED Course  I have reviewed the triage vital signs and the nursing notes.  Pertinent labs & imaging results that were available during my care of the patient were reviewed by me  and considered in my medical decision making (see chart for details).    MDM Rules/Calculators/A&P                          Patient came in as a level 1 trauma.  Unresponsive.  Blown pupil.  High likelihood of head injury.  Met by myself and trauma surgery.  Intubated by me.  Chest x-ray reassuring.  Pelvis x-ray reassuring.  Take emergently CT scan that showed large intracranial hemorrhage.  Likely OR.   CRITICAL CARE Performed by: NatDavonna Bellingtal critical care time: 30 minutes Critical care time was exclusive of separately billable procedures and treating other patients. Critical care was necessary to treat or prevent imminent or life-threatening deterioration. Critical care was time spent personally by me on the following activities: development of treatment plan with patient and/or surrogate as well as nursing, discussions with consultants, evaluation of patient's response to treatment, examination of patient, obtaining history from patient or surrogate, ordering and performing treatments and interventions, ordering and review of laboratory studies, ordering and review of radiographic studies, pulse oximetry and re-evaluation of patient's condition.  Final Clinical Impression(s) / ED Diagnoses Final diagnoses:  Trauma  Fall, initial encounter  Subdural hematoma ([place]CC[place] City[place] fracture of skull, unspecified bone, initial encounter ([facility]  Rx / DC Grand[facility] ED Discharge Orders    None       PicDavonna BellingD 09/05/19 1417

## 2019-09-05 NOTE — Anesthesia Postprocedure Evaluation (Signed)
Anesthesia Post Note  Patient: Jason Skinner  Procedure(s) Performed: CRANIOTOMY HEMATOMA EVACUATION SUBDURAL WITH  PLACEMENT OF BONE FLAP IN ABDOMEN. (Right Head)     Patient location during evaluation: ICU Anesthesia Type: General Level of consciousness: sedated and patient remains intubated per anesthesia plan Pain management: pain level controlled Vital Signs Assessment: post-procedure vital signs reviewed and stable Respiratory status: respiratory function stable and patient remains intubated per anesthesia plan Cardiovascular status: blood pressure returned to baseline and stable Postop Assessment: no apparent nausea or vomiting Anesthetic complications: no Comments: Transported intubated and sedated with full monitors to ICU, no issues.    No complications documented.  Last Vitals:  Vitals:   09/05/19 1445 09/05/19 1534  BP:  (!) 138/57  Pulse:  61  Resp:  18  Temp:  (!) 36.4 C  SpO2: 100% 100%    Last Pain:  Vitals:   09/05/19 1105  TempSrc: Tympanic                 Pervis Hocking

## 2019-09-05 NOTE — Progress Notes (Signed)
I spoke with the patient's brother via the interpreter.

## 2019-09-05 NOTE — H&P (Signed)
[facility] Admission Note  Jason Skinner [birth year]  [id number].    Requesting MD: Davonna Belling Chief Complaint/Reason for Consult: fall  HPI:  Jason Skinner is a 29yo male who was brought into [facility] earlier today as a level 2 trauma after falling about 20 feet from a ladder on construction site.  Per report +LOC after fall, then patient was alert and oriented. He decompensated with EMS and was upgraded to a level 1. On our arrival patient was moving all 4 extremities, unresponsive. Pupils unequal R>L. He was intubated by EDP. Unknown medical history.  Review of Systems  Unable to perform ROS: Acuity of condition   No family history on file.  No past medical history on file.  Social History:  has no history on file for tobacco use, alcohol use, and drug use.  Allergies: Not on File  (Not in a hospital admission)   Prior to Admission medications   Not on File    Blood pressure 132/82, pulse (!) 55, temperature (!) 96.2 F (35.7 C), temperature source Tympanic, resp. rate 18, height 5\' 10"  (1.778 m), weight 65 kg, SpO2 100 %. Physical Exam: Physical Exam Vitals reviewed.  Constitutional:      Comments: nonresponsive  HENT:     Head:     Comments: Abrasion noted to left forehead and lateral left head    Right Ear: External ear normal.     Left Ear: External ear normal.     Nose: Nose normal.     Mouth/Throat:     Comments: Some blood noted in pharynx upon intubation Eyes:     General: No scleral icterus.    Comments: Pupils equal, Right pupil blown, Left normal  Neck:     Comments: C-collar in place Cardiovascular:     Rate and Rhythm: Regular rhythm. Bradycardia present.     Pulses: Normal pulses.          Radial pulses are 2+ on the right side and 2+ on the left side.       Dorsalis pedis pulses are 2+ on the right side and 2+ on the left side.     Heart sounds: Normal heart sounds.  Pulmonary:     Effort: Pulmonary effort is normal.     Breath  sounds: Normal breath sounds.  Abdominal:     General: Abdomen is flat. Bowel sounds are normal. There is no distension.     Palpations: Abdomen is soft. There is no mass.     Tenderness: There is no abdominal tenderness. There is no guarding or rebound.     Hernia: No hernia is present.     Comments: Well healed midline abdominal scar  Musculoskeletal:     Comments: Right elbow edema/deformity. No gross deformities noted to BLE.  Skin:    General: Skin is warm and dry.  Neurological:     Mental Status: He is unresponsive.     Comments: GCS 3 (initially responding to painful stimuli and moving all 4 extremities but quickly decompensated)  Psychiatric:     Comments: Nonverbal      Results for orders placed or performed during the hospital encounter of 09/05/19 (from the past 48 hour(s))  Sample to Blood Bank     Status: None   Collection Time: 09/05/19 11:00 AM  Result Value Ref Range   Blood Bank Specimen SAMPLE AVAILABLE FOR TESTING    Sample Expiration      09/06/2019,2359 Performed at [facility], [place]  [address], [place], [place] [address]   CBC     Status: Abnormal   Collection Time: 09/05/19 11:12 AM  Result Value Ref Range   WBC 19.1 (H) 4.0 - 10.5 K/uL   RBC 4.64 4.22 - 5.81 MIL/uL   Hemoglobin 14.6 13.0 - 17.0 g/dL   HCT 43.3 39 - 52 %   MCV 93.3 80.0 - 100.0 fL   MCH 31.5 26.0 - 34.0 pg   MCHC 33.7 30.0 - 36.0 g/dL   RDW 14.1 11.5 - 15.5 %   Platelets 284 150 - 400 K/uL   nRBC 0.0 0.0 - 0.2 %    Comment: Performed at [facility], [place] [address] [place], [place] [address]  Ethanol     Status: None   Collection Time: 09/05/19 11:12 AM  Result Value Ref Range   Alcohol, Ethyl (B) <10 <10 mg/dL    Comment: (NOTE) Lowest detectable limit for serum alcohol is 10 mg/dL.  For medical purposes only. Performed at [facility], [place] [address] [place], [place] [address]   Protime-INR     Status: None   Collection Time: 09/05/19 11:12 AM   Result Value Ref Range   Prothrombin Time 13.2 11.4 - 15.2 seconds   INR 1.0 0.8 - 1.2    Comment: (NOTE) INR goal varies based on device and disease states. Performed at [facility], [place] [address] [place], [place] [address]   I-Stat Chem 8, ED     Status: Abnormal   Collection Time: 09/05/19 11:16 AM  Result Value Ref Range   Sodium 143 135 - 145 mmol/L   Potassium 2.8 (L) 3.5 - 5.1 mmol/L   Chloride 106 98 - 111 mmol/L   BUN 8 6 - 20 mg/dL   Creatinine, Ser 0.80 0.61 - 1.24 mg/dL   Glucose, Bld 169 (H) 70 - 99 mg/dL    Comment: Glucose reference range applies only to samples taken after fasting for at least 8 hours.   Calcium, Ion 1.07 (L) 1.15 - 1.40 mmol/L   TCO2 23 22 - 32 mmol/L   Hemoglobin 15.0 13.0 - 17.0 g/dL   HCT 44.0 39 - 52 %   CT HEAD WO CONTRAST  Result Date: 09/05/2019 CLINICAL DATA:  Level 1 trauma EXAM: CT HEAD WITHOUT CONTRAST CT MAXILLOFACIAL WITHOUT CONTRAST CT CERVICAL SPINE WITHOUT CONTRAST TECHNIQUE: Multidetector CT imaging of the head, cervical spine, and maxillofacial structures were performed using the standard protocol without intravenous contrast. Multiplanar CT image reconstructions of the cervical spine and maxillofacial structures were also generated. COMPARISON:  None. FINDINGS: CT HEAD FINDINGS Brain: Mixed density subdural hematoma along the right cerebral convexity and left frontal pole. Mixed density could be from a continued and un clotted bleeding, especially on the right. The right-sided hematoma measures up to 15 mm in thickness. Midline shift measures up to 2 cm. Pneumocephalus with intraventricular extension. Right uncal herniation. Mix of contusion and subarachnoid hemorrhage over the left temporal pole which is edematous. No visible infarct. Vascular: Negative Skull: Midline skull fracture involving the occiput and clivus. There is extension into the right sphenoid sinus, which accounts for pneumocephalus and sinus opacification.  Posterior scalp contusion CT MAXILLOFACIAL FINDINGS Osseous: Intact and located mandible. Midline clivus fracture extending to the right sphenoid sinus. Orbits: No evidence of injury. Sinuses: Right sphenoid and maxillary hemosinus Soft tissues: Negative CT CERVICAL SPINE FINDINGS Alignment: Normal Skull base and vertebrae: Clival fracture. No cervical spine fracture. Soft tissues and  spinal canal: Gas present around the dens. The tectorial membrane is continuous and visible. No visible canal hematoma Disc levels:  No degenerative changes or visible impingement. Upper chest: Reported separately Critical Value/emergent results were discussed in person on 09/05/2019 at 11:35 am with Dr Bobbye Morton. IMPRESSION: Head CT: 1. Right more than left cerebral convexity subdural hematoma with leftward midline shift of 2 cm and right uncal herniation. 2. Left temporal pole contusion with overlying subarachnoid hemorrhage. 3. Occipital and clival midline fractures extending into the right sphenoid sinus with pneumocephalus. Cervical spine CT: Gas around the dens presumably related to the skull base fracture. Craniocervical alignment is normal and the tectorial membrane is visible. Alar or other ligamentous injury is still conceivable. Maxillofacial CT: Clival fracture extending into the right sphenoid sinus with hemosinus and pneumocephalus. Electronically Signed   By: Monte Fantasia M.D.   On: 09/05/2019 11:40   CT CHEST W CONTRAST  Result Date: 09/05/2019 CLINICAL DATA:  Moderate to severe chest trauma EXAM: CT CHEST, ABDOMEN, AND PELVIS WITH CONTRAST TECHNIQUE: Multidetector CT imaging of the chest, abdomen and pelvis was performed following the standard protocol during bolus administration of intravenous contrast. CONTRAST:  Dose is currently not available COMPARISON:  None. FINDINGS: CT CHEST FINDINGS Cardiovascular: Normal heart size. No pericardial effusion. No evidence of great vessel injury. Mediastinum/Nodes: No  hematoma or pneumomediastinum. There is a coarse calcification in the right lobe of the thyroid which is asymmetrically larger with a 2 cm nodular projection extending inferiorly that is isodense to the remaining gland. Lungs/Pleura: No hemothorax, pneumothorax, or lung contusion. Dependent opacity with volume loss is likely atelectasis and aspiration. Musculoskeletal: No acute finding CT ABDOMEN PELVIS FINDINGS Hepatobiliary: No hepatic injury or perihepatic hematoma. Gallbladder is unremarkable Pancreas: Negative Spleen: Left upper quadrant poly splenia or localize splenosis. No acute injury. Adrenals/Urinary Tract: No adrenal hemorrhage or renal injury identified. Bladder is unremarkable. Stomach/Bowel: No evidence of injury Vascular/Lymphatic: No acute vascular finding Reproductive: Negative Other: No ascites or pneumoperitoneum. Musculoskeletal: L1 and L2 right transverse process fractures. Vacuum phenomenon around the sacroiliac joints without diastasis IMPRESSION: 1. L1 and L2 right transverse process fractures. 2. Bilateral dependent atelectasis and or aspiration. 3. 2 cm nodular projection from the right thyroid lobe. After convalescence, recommend thyroid US(Ref: J Am Coll Radiol. 2015 Feb;12(2): 143-50). Electronically Signed   By: Monte Fantasia M.D.   On: 09/05/2019 11:45   CT CERVICAL SPINE WO CONTRAST  Result Date: 09/05/2019 CLINICAL DATA:  Level 1 trauma EXAM: CT HEAD WITHOUT CONTRAST CT MAXILLOFACIAL WITHOUT CONTRAST CT CERVICAL SPINE WITHOUT CONTRAST TECHNIQUE: Multidetector CT imaging of the head, cervical spine, and maxillofacial structures were performed using the standard protocol without intravenous contrast. Multiplanar CT image reconstructions of the cervical spine and maxillofacial structures were also generated. COMPARISON:  None. FINDINGS: CT HEAD FINDINGS Brain: Mixed density subdural hematoma along the right cerebral convexity and left frontal pole. Mixed density could be from a  continued and un clotted bleeding, especially on the right. The right-sided hematoma measures up to 15 mm in thickness. Midline shift measures up to 2 cm. Pneumocephalus with intraventricular extension. Right uncal herniation. Mix of contusion and subarachnoid hemorrhage over the left temporal pole which is edematous. No visible infarct. Vascular: Negative Skull: Midline skull fracture involving the occiput and clivus. There is extension into the right sphenoid sinus, which accounts for pneumocephalus and sinus opacification. Posterior scalp contusion CT MAXILLOFACIAL FINDINGS Osseous: Intact and located mandible. Midline clivus fracture extending to the right sphenoid sinus.  Orbits: No evidence of injury. Sinuses: Right sphenoid and maxillary hemosinus Soft tissues: Negative CT CERVICAL SPINE FINDINGS Alignment: Normal Skull base and vertebrae: Clival fracture. No cervical spine fracture. Soft tissues and spinal canal: Gas present around the dens. The tectorial membrane is continuous and visible. No visible canal hematoma Disc levels:  No degenerative changes or visible impingement. Upper chest: Reported separately Critical Value/emergent results were discussed in person on 09/05/2019 at 11:35 am with Dr Bobbye Morton. IMPRESSION: Head CT: 1. Right more than left cerebral convexity subdural hematoma with leftward midline shift of 2 cm and right uncal herniation. 2. Left temporal pole contusion with overlying subarachnoid hemorrhage. 3. Occipital and clival midline fractures extending into the right sphenoid sinus with pneumocephalus. Cervical spine CT: Gas around the dens presumably related to the skull base fracture. Craniocervical alignment is normal and the tectorial membrane is visible. Alar or other ligamentous injury is still conceivable. Maxillofacial CT: Clival fracture extending into the right sphenoid sinus with hemosinus and pneumocephalus. Electronically Signed   By: Monte Fantasia M.D.   On: 09/05/2019 11:40    CT ABDOMEN PELVIS W CONTRAST  Result Date: 09/05/2019 CLINICAL DATA:  Moderate to severe chest trauma EXAM: CT CHEST, ABDOMEN, AND PELVIS WITH CONTRAST TECHNIQUE: Multidetector CT imaging of the chest, abdomen and pelvis was performed following the standard protocol during bolus administration of intravenous contrast. CONTRAST:  Dose is currently not available COMPARISON:  None. FINDINGS: CT CHEST FINDINGS Cardiovascular: Normal heart size. No pericardial effusion. No evidence of great vessel injury. Mediastinum/Nodes: No hematoma or pneumomediastinum. There is a coarse calcification in the right lobe of the thyroid which is asymmetrically larger with a 2 cm nodular projection extending inferiorly that is isodense to the remaining gland. Lungs/Pleura: No hemothorax, pneumothorax, or lung contusion. Dependent opacity with volume loss is likely atelectasis and aspiration. Musculoskeletal: No acute finding CT ABDOMEN PELVIS FINDINGS Hepatobiliary: No hepatic injury or perihepatic hematoma. Gallbladder is unremarkable Pancreas: Negative Spleen: Left upper quadrant poly splenia or localize splenosis. No acute injury. Adrenals/Urinary Tract: No adrenal hemorrhage or renal injury identified. Bladder is unremarkable. Stomach/Bowel: No evidence of injury Vascular/Lymphatic: No acute vascular finding Reproductive: Negative Other: No ascites or pneumoperitoneum. Musculoskeletal: L1 and L2 right transverse process fractures. Vacuum phenomenon around the sacroiliac joints without diastasis IMPRESSION: 1. L1 and L2 right transverse process fractures. 2. Bilateral dependent atelectasis and or aspiration. 3. 2 cm nodular projection from the right thyroid lobe. After convalescence, recommend thyroid US(Ref: J Am Coll Radiol. 2015 Feb;12(2): 143-50). Electronically Signed   By: Monte Fantasia M.D.   On: 09/05/2019 11:45   DG Pelvis Portable  Result Date: 09/05/2019 CLINICAL DATA:  Level 1 trauma. Fell 20 feet. EXAM:  PORTABLE PELVIS 1-2 VIEWS COMPARISON:  None. FINDINGS: Both hips are normally located. No hip fracture. The pubic symphysis and SI joints are intact. No definite pelvic fractures. IMPRESSION: No acute bony findings. Electronically Signed   By: Marijo Sanes M.D.   On: 09/05/2019 11:22   DG Chest Port 1 View  Result Date: 09/05/2019 CLINICAL DATA:  Level 1 trauma. Fell 20 feet. EXAM: PORTABLE CHEST 1 VIEW COMPARISON:  None. FINDINGS: The endotracheal tube is in good position, 3.8 cm above the carina. The cardiac silhouette, mediastinal and hilar contours are within normal limits. The lungs are clear. No pleural effusion or pneumothorax. The bony thorax is grossly intact. No obvious acute rib fractures. Remote healed mid left clavicle fracture is noted. IMPRESSION: 1. Endotracheal tube in good position, 3.8 cm  above the carina. 2. No acute cardiopulmonary findings. Electronically Signed   By: Marijo Sanes M.D.   On: 09/05/2019 11:22   CT MAXILLOFACIAL WO CONTRAST  Result Date: 09/05/2019 CLINICAL DATA:  Level 1 trauma EXAM: CT HEAD WITHOUT CONTRAST CT MAXILLOFACIAL WITHOUT CONTRAST CT CERVICAL SPINE WITHOUT CONTRAST TECHNIQUE: Multidetector CT imaging of the head, cervical spine, and maxillofacial structures were performed using the standard protocol without intravenous contrast. Multiplanar CT image reconstructions of the cervical spine and maxillofacial structures were also generated. COMPARISON:  None. FINDINGS: CT HEAD FINDINGS Brain: Mixed density subdural hematoma along the right cerebral convexity and left frontal pole. Mixed density could be from a continued and un clotted bleeding, especially on the right. The right-sided hematoma measures up to 15 mm in thickness. Midline shift measures up to 2 cm. Pneumocephalus with intraventricular extension. Right uncal herniation. Mix of contusion and subarachnoid hemorrhage over the left temporal pole which is edematous. No visible infarct. Vascular: Negative  Skull: Midline skull fracture involving the occiput and clivus. There is extension into the right sphenoid sinus, which accounts for pneumocephalus and sinus opacification. Posterior scalp contusion CT MAXILLOFACIAL FINDINGS Osseous: Intact and located mandible. Midline clivus fracture extending to the right sphenoid sinus. Orbits: No evidence of injury. Sinuses: Right sphenoid and maxillary hemosinus Soft tissues: Negative CT CERVICAL SPINE FINDINGS Alignment: Normal Skull base and vertebrae: Clival fracture. No cervical spine fracture. Soft tissues and spinal canal: Gas present around the dens. The tectorial membrane is continuous and visible. No visible canal hematoma Disc levels:  No degenerative changes or visible impingement. Upper chest: Reported separately Critical Value/emergent results were discussed in person on 09/05/2019 at 11:35 am with Dr Bobbye Morton. IMPRESSION: Head CT: 1. Right more than left cerebral convexity subdural hematoma with leftward midline shift of 2 cm and right uncal herniation. 2. Left temporal pole contusion with overlying subarachnoid hemorrhage. 3. Occipital and clival midline fractures extending into the right sphenoid sinus with pneumocephalus. Cervical spine CT: Gas around the dens presumably related to the skull base fracture. Craniocervical alignment is normal and the tectorial membrane is visible. Alar or other ligamentous injury is still conceivable. Maxillofacial CT: Clival fracture extending into the right sphenoid sinus with hemosinus and pneumocephalus. Electronically Signed   By: Monte Fantasia M.D.   On: 09/05/2019 11:40   Anti-infectives (From admission, onward)   None        Assessment/Plan Fall B SDH R>L with midline shift and uncal herniation, L temporal contusion and SAH Occipital and clival midline fxs extending into R sphenoid with hemosinus and pneumocephalus VDRF Atelectasis and/or aspiration  L1/2 R TP fxs  Right elbow deformity - will need  film  ID - none FEN - IVF, NPO Foley - place VTE - SCDs  Plan - To OR emergently with neurosurgery Arnoldo Morale). Plan to admit inpatient to trauma ICU, intubated.   [place], [facility] 09/05/2019, 11:56 AM Please see Amion for pager number during day hours 7:00am-4:30pm

## 2019-09-05 NOTE — Progress Notes (Signed)
Dr Arnoldo Morale and Arnetha Massy NP notified of EVD not pulsatile and no drainage. ICP monitor also not functioning consistently. MD aware of issues and no further changes at this time. Continue to monitor patient neuro status.

## 2019-09-05 NOTE — ED Notes (Signed)
Notified that family has arrived.  Dr Bobbye Morton aware

## 2019-09-05 NOTE — ED Notes (Signed)
Dr. Jenkins at bedside. 

## 2019-09-05 NOTE — Progress Notes (Signed)
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  Patient is intubated and gone to CT.   Provide support to staff. Chaplain will follow as needed.   Jaclynn Major, [place], [facility], Pager [phone]

## 2019-09-06 ENCOUNTER — Inpatient Hospital Stay (HOSPITAL_COMMUNITY): Payer: Medicaid Other

## 2019-09-06 ENCOUNTER — Encounter (HOSPITAL_COMMUNITY): Payer: Self-pay | Admitting: Neurosurgery

## 2019-09-06 LAB — CBC
HCT: 32.3 % — ABNORMAL LOW (ref 39.0–52.0)
Hemoglobin: 11 g/dL — ABNORMAL LOW (ref 13.0–17.0)
MCH: 31.8 pg (ref 26.0–34.0)
MCHC: 34.1 g/dL (ref 30.0–36.0)
MCV: 93.4 fL (ref 80.0–100.0)
Platelets: 232 10*3/uL (ref 150–400)
RBC: 3.46 MIL/uL — ABNORMAL LOW (ref 4.22–5.81)
RDW: 14.5 % (ref 11.5–15.5)
WBC: 17.1 10*3/uL — ABNORMAL HIGH (ref 4.0–10.5)
nRBC: 0 % (ref 0.0–0.2)

## 2019-09-06 LAB — GLUCOSE, CAPILLARY
Glucose-Capillary: 122 mg/dL — ABNORMAL HIGH (ref 70–99)
Glucose-Capillary: 119 mg/dL — ABNORMAL HIGH (ref 70–99)
Glucose-Capillary: 155 mg/dL — ABNORMAL HIGH (ref 70–99)
Glucose-Capillary: 148 mg/dL — ABNORMAL HIGH (ref 70–99)

## 2019-09-06 LAB — COMPREHENSIVE METABOLIC PANEL
ALT: 16 U/L (ref 0–44)
AST: 25 U/L (ref 15–41)
Albumin: 3.1 g/dL — ABNORMAL LOW (ref 3.5–5.0)
Alkaline Phosphatase: 40 U/L (ref 38–126)
Anion gap: 7 (ref 5–15)
BUN: 6 mg/dL (ref 6–20)
CO2: 23 mmol/L (ref 22–32)
Calcium: 7.7 mg/dL — ABNORMAL LOW (ref 8.9–10.3)
Chloride: 108 mmol/L (ref 98–111)
Creatinine, Ser: 0.71 mg/dL (ref 0.61–1.24)
GFR calc Af Amer: >60 mL/min (ref >60)
GFR calc non Af Amer: >60 mL/min (ref >60)
Glucose, Bld: 135 mg/dL — ABNORMAL HIGH (ref 70–99)
Potassium: 3.6 mmol/L (ref 3.5–5.1)
Sodium: 138 mmol/L (ref 135–145)
Total Bilirubin: 2.2 mg/dL — ABNORMAL HIGH (ref 0.3–1.2)
Total Protein: 5.5 g/dL — ABNORMAL LOW (ref 6.5–8.1)

## 2019-09-06 LAB — TRIGLYCERIDES: Triglycerides: 73 mg/dL (ref <150)

## 2019-09-06 LAB — MAGNESIUM
Magnesium: 1.8 mg/dL (ref 1.7–2.4)
Magnesium: 1.8 mg/dL (ref 1.7–2.4)

## 2019-09-06 LAB — PHOSPHORUS
Phosphorus: 2.6 mg/dL (ref 2.5–4.6)
Phosphorus: 2.5 mg/dL (ref 2.5–4.6)

## 2019-09-06 MED ORDER — ACETAMINOPHEN 325 MG PO TABS
650.0000 mg | ORAL_TABLET | ORAL | Status: DC | PRN
  Administered 2019-09-06 – 2019-09-07 (×2): 650 mg
  Filled 2019-09-06: qty 2

## 2019-09-06 MED ORDER — PRO-STAT SUGAR FREE PO LIQD
30.0000 mL | Freq: Two times a day (BID) | ORAL | Status: DC
  Administered 2019-09-06: 30 mL
  Filled 2019-09-06: qty 30

## 2019-09-06 MED ORDER — SELENIUM 200 MCG PO TABS
200.0000 ug | ORAL_TABLET | Freq: Every day | ORAL | Status: AC
  Administered 2019-09-06 – 2019-09-12 (×5): 200 ug
  Filled 2019-09-06 (×7): qty 1

## 2019-09-06 MED ORDER — ASCORBIC ACID 500 MG PO TABS
1000.0000 mg | ORAL_TABLET | Freq: Three times a day (TID) | ORAL | Status: AC
  Administered 2019-09-06 – 2019-09-12 (×16): 1000 mg
  Filled 2019-09-06 (×17): qty 2

## 2019-09-06 MED ORDER — PIVOT 1.5 CAL PO LIQD
1000.0000 mL | ORAL | Status: DC
  Administered 2019-09-06: 1000 mL

## 2019-09-06 MED ORDER — VITAL HIGH PROTEIN PO LIQD: 1000.0000 mL | ORAL | Status: DC

## 2019-09-06 MED ORDER — PIVOT 1.5 CAL PO LIQD
1000.0000 mL | ORAL | Status: DC
  Administered 2019-09-06 – 2019-09-11 (×5): 1000 mL
  Filled 2019-09-06 (×2): qty 1000

## 2019-09-06 MED FILL — Thrombin For Soln 5000 Unit: CUTANEOUS | Qty: 5000 | Status: AC

## 2019-09-06 NOTE — Progress Notes (Signed)
Subjective: The patient remains intubated and sedated.  He is in no apparent distress.  His brother is at the bedside with a Optometrist.  Objective: Vital signs in last 24 hours: Temp:  [96.2 F (35.7 C)-99.3 F (37.4 C)] 97.9 F (36.6 C) (07/01 0800) Pulse Rate:  [55-86] 61 (07/01 0800) Resp:  [15-24] 20 (07/01 0800) BP: (95-168)/(54-107) 130/77 (07/01 0800) SpO2:  [99 %-100 %] 100 % (07/01 0800) Arterial Line BP: (124-160)/(53-73) 153/72 (07/01 0800) FiO2 (%):  [40 %-100 %] 40 % (07/01 0758) Weight:  [65 kg-77 kg] 77 kg (06/30 1630) Estimated body mass index is 24.36 kg/m as calculated from the following:   Height as of this encounter: 5\' 10"  (1.778 m).   Weight as of this encounter: 77 kg.   Intake/Output from previous day: 06/30 0701 - 07/01 0700 In: 3034.7 [I.V.:2673.7; IV Piggyback:361] Out: 6269 [Urine:2150; Emesis/NG output:450; Drains:333; Blood:500] Intake/Output this shift: Total I/O In: -  Out: 95 [Urine:75; Drains:20]  Physical exam Glascow coma scale [id number] 8 intubated.  He follows commands and moves all 4 extremities.  His left pupil is approximately 2 mm, right pupil approximately 3 mm.  I have reviewed the patient's follow-up head CT performed this morning.  His large right subdural hematoma has been evacuated.  He has a right frontal contusion.  The ventriculostomy is just superior to the ventricles.  There is greatly decreased midline shift.  Lab Results: Recent Labs    09/05/19 1112 09/05/19 1116 09/05/19 1206 09/06/19 0500  WBC 19.1*  --   --  17.1*  HGB 14.6   < > 12.9* 11.0*  HCT 43.3   < > 38.0* 32.3*  PLT 284  --   --  232   < > = values in this interval not displayed.   BMET Recent Labs    09/05/19 1649 09/06/19 0500  NA 141 138  K 3.4* 3.6  CL 112* 108  CO2 21* 23  GLUCOSE 138* 135*  BUN 5* 6  CREATININE 0.63 0.71  CALCIUM 7.5* 7.7*    Studies/Results: DG ELBOW COMPLETE RIGHT (3+VIEW)  Result Date: 09/05/2019 CLINICAL DATA:   Pain status post fall EXAM: RIGHT ELBOW - COMPLETE 3+ VIEW COMPARISON:  None. FINDINGS: There is no evidence of fracture, dislocation, or joint effusion. There is no evidence of arthropathy or other focal bone abnormality. Soft tissues are unremarkable. IMPRESSION: Negative. Electronically Signed   By: Constance Holster M.D.   On: 09/05/2019 14:53   DG Abd 1 View  Result Date: 09/05/2019 CLINICAL DATA:  Pain. EXAM: ABDOMEN - 1 VIEW COMPARISON:  CT from the same day FINDINGS: The enteric tube projects over the stomach. The visualized bowel gas pattern is unremarkable. IMPRESSION: Enteric tube projects over the stomach. Electronically Signed   By: Constance Holster M.D.   On: 09/05/2019 14:54   CT HEAD WO CONTRAST  Result Date: 09/06/2019 CLINICAL DATA:  Subdural hematoma. EXAM: CT HEAD WITHOUT CONTRAST TECHNIQUE: Contiguous axial images were obtained from the base of the skull through the vertex without intravenous contrast. COMPARISON:  Head CT 09/05/2019 FINDINGS: Brain: There postoperative changes from interval right-sided craniotomy for right subdural hematoma evacuation. There is decreased mass effect, now measuring 5 mm (previously 20 mm). Persistent although decreased leftward subfalcine herniation. There is persistent hyperdense extra-axial hemorrhage overlying the right cerebral convexity at the craniotomy site which focally measures up to 2.3 cm in thickness along the course of a right subdural drain (series 5, image 39). There is also  a small amount of extra-axial pneumocephalus adjacent to the craniotomy site. Persistent small-bowel subdural hemorrhage along the falx. There has been interval blooming of hemorrhagic contusion within the right greater than left anterior frontal lobes and anterior left temporal lobe, again with overlying acute subarachnoid hemorrhage. A component of the extra-axial hemorrhage overlying the frontal pole may be subdural. There has been interval placement of a right  frontal approach ventricular drain which terminates within the anterior left lateral ventricle. Trace hemorrhage within the atrium of the left lateral ventricle. No evidence of hydrocephalus. There is a small focus of pneumocephalus within the left lateral ventricle frontal horn. Persistent although decreased effacement of the right lateral ventral Vascular: No hyperdense vessel. Skull: Right-sided craniotomy. Redemonstrated midline fracture involving the occipital calvarium and clivus with extension into the right sphenoid sinus. Sinuses/Orbits: Visualized orbits show no acute finding. Near complete opacification of the sphenoid sinuses. Air-fluid level within the right maxillary sinus. No significant mastoid effusion. Partially visualized support tubes. Other: Scalp edema overlying the right-sided craniotomy site with associated scalp staples. IMPRESSION: IMPRESSION 1. Postoperative changes from interval right-sided craniotomy for right subdural hematoma evacuation. 2. Decreased mass effect with now 5 mm leftward midline shift (previously 20 mm). Persistent although decreased leftward subfalcine herniation. 3. There is some persistent extra-axial hemorrhage overlying the right cerebral convexity at the craniotomy site which focally measures up to 2.3 cm in thickness along the course of a subdural drain. Additionally, there is persistent small volume subdural hemorrhage along the falx. 4. Interval blooming of hemorrhagic contusion involving the right greater than left anterior frontal lobes and left temporal lobe with redemonstrated overlying acute subarachnoid hemorrhage. 5. Interval placement of a right frontal approach ventricular drain terminating within the anterior left lateral ventricle. No evidence of hydrocephalus. Trace hemorrhage within the atrium of the left lateral ventricle. Electronically Signed   By: Kellie Simmering DO   On: 09/06/2019 07:39   CT HEAD WO CONTRAST  Result Date: 09/05/2019 CLINICAL  DATA:  Level 1 trauma EXAM: CT HEAD WITHOUT CONTRAST CT MAXILLOFACIAL WITHOUT CONTRAST CT CERVICAL SPINE WITHOUT CONTRAST TECHNIQUE: Multidetector CT imaging of the head, cervical spine, and maxillofacial structures were performed using the standard protocol without intravenous contrast. Multiplanar CT image reconstructions of the cervical spine and maxillofacial structures were also generated. COMPARISON:  None. FINDINGS: CT HEAD FINDINGS Brain: Mixed density subdural hematoma along the right cerebral convexity and left frontal pole. Mixed density could be from a continued and un clotted bleeding, especially on the right. The right-sided hematoma measures up to 15 mm in thickness. Midline shift measures up to 2 cm. Pneumocephalus with intraventricular extension. Right uncal herniation. Mix of contusion and subarachnoid hemorrhage over the left temporal pole which is edematous. No visible infarct. Vascular: Negative Skull: Midline skull fracture involving the occiput and clivus. There is extension into the right sphenoid sinus, which accounts for pneumocephalus and sinus opacification. Posterior scalp contusion CT MAXILLOFACIAL FINDINGS Osseous: Intact and located mandible. Midline clivus fracture extending to the right sphenoid sinus. Orbits: No evidence of injury. Sinuses: Right sphenoid and maxillary hemosinus Soft tissues: Negative CT CERVICAL SPINE FINDINGS Alignment: Normal Skull base and vertebrae: Clival fracture. No cervical spine fracture. Soft tissues and spinal canal: Gas present around the dens. The tectorial membrane is continuous and visible. No visible canal hematoma Disc levels:  No degenerative changes or visible impingement. Upper chest: Reported separately Critical Value/emergent results were discussed in person on 09/05/2019 at 11:35 am with Dr Bobbye Morton. IMPRESSION: Head CT: 1.  Right more than left cerebral convexity subdural hematoma with leftward midline shift of 2 cm and right uncal herniation.  2. Left temporal pole contusion with overlying subarachnoid hemorrhage. 3. Occipital and clival midline fractures extending into the right sphenoid sinus with pneumocephalus. Cervical spine CT: Gas around the dens presumably related to the skull base fracture. Craniocervical alignment is normal and the tectorial membrane is visible. Alar or other ligamentous injury is still conceivable. Maxillofacial CT: Clival fracture extending into the right sphenoid sinus with hemosinus and pneumocephalus. Electronically Signed   By: Monte Fantasia M.D.   On: 09/05/2019 11:40   CT CHEST W CONTRAST  Result Date: 09/05/2019 CLINICAL DATA:  Moderate to severe chest trauma EXAM: CT CHEST, ABDOMEN, AND PELVIS WITH CONTRAST TECHNIQUE: Multidetector CT imaging of the chest, abdomen and pelvis was performed following the standard protocol during bolus administration of intravenous contrast. CONTRAST:  Dose is currently not available COMPARISON:  None. FINDINGS: CT CHEST FINDINGS Cardiovascular: Normal heart size. No pericardial effusion. No evidence of great vessel injury. Mediastinum/Nodes: No hematoma or pneumomediastinum. There is a coarse calcification in the right lobe of the thyroid which is asymmetrically larger with a 2 cm nodular projection extending inferiorly that is isodense to the remaining gland. Lungs/Pleura: No hemothorax, pneumothorax, or lung contusion. Dependent opacity with volume loss is likely atelectasis and aspiration. Musculoskeletal: No acute finding CT ABDOMEN PELVIS FINDINGS Hepatobiliary: No hepatic injury or perihepatic hematoma. Gallbladder is unremarkable Pancreas: Negative Spleen: Left upper quadrant poly splenia or localize splenosis. No acute injury. Adrenals/Urinary Tract: No adrenal hemorrhage or renal injury identified. Bladder is unremarkable. Stomach/Bowel: No evidence of injury Vascular/Lymphatic: No acute vascular finding Reproductive: Negative Other: No ascites or pneumoperitoneum.  Musculoskeletal: L1 and L2 right transverse process fractures. Vacuum phenomenon around the sacroiliac joints without diastasis IMPRESSION: 1. L1 and L2 right transverse process fractures. 2. Bilateral dependent atelectasis and or aspiration. 3. 2 cm nodular projection from the right thyroid lobe. After convalescence, recommend thyroid US(Ref: J Am Coll Radiol. 2015 Feb;12(2): 143-50). Electronically Signed   By: Monte Fantasia M.D.   On: 09/05/2019 11:45   CT CERVICAL SPINE WO CONTRAST  Result Date: 09/05/2019 CLINICAL DATA:  Level 1 trauma EXAM: CT HEAD WITHOUT CONTRAST CT MAXILLOFACIAL WITHOUT CONTRAST CT CERVICAL SPINE WITHOUT CONTRAST TECHNIQUE: Multidetector CT imaging of the head, cervical spine, and maxillofacial structures were performed using the standard protocol without intravenous contrast. Multiplanar CT image reconstructions of the cervical spine and maxillofacial structures were also generated. COMPARISON:  None. FINDINGS: CT HEAD FINDINGS Brain: Mixed density subdural hematoma along the right cerebral convexity and left frontal pole. Mixed density could be from a continued and un clotted bleeding, especially on the right. The right-sided hematoma measures up to 15 mm in thickness. Midline shift measures up to 2 cm. Pneumocephalus with intraventricular extension. Right uncal herniation. Mix of contusion and subarachnoid hemorrhage over the left temporal pole which is edematous. No visible infarct. Vascular: Negative Skull: Midline skull fracture involving the occiput and clivus. There is extension into the right sphenoid sinus, which accounts for pneumocephalus and sinus opacification. Posterior scalp contusion CT MAXILLOFACIAL FINDINGS Osseous: Intact and located mandible. Midline clivus fracture extending to the right sphenoid sinus. Orbits: No evidence of injury. Sinuses: Right sphenoid and maxillary hemosinus Soft tissues: Negative CT CERVICAL SPINE FINDINGS Alignment: Normal Skull base and  vertebrae: Clival fracture. No cervical spine fracture. Soft tissues and spinal canal: Gas present around the dens. The tectorial membrane is continuous and visible. No visible  canal hematoma Disc levels:  No degenerative changes or visible impingement. Upper chest: Reported separately Critical Value/emergent results were discussed in person on 09/05/2019 at 11:35 am with Dr Bobbye Morton. IMPRESSION: Head CT: 1. Right more than left cerebral convexity subdural hematoma with leftward midline shift of 2 cm and right uncal herniation. 2. Left temporal pole contusion with overlying subarachnoid hemorrhage. 3. Occipital and clival midline fractures extending into the right sphenoid sinus with pneumocephalus. Cervical spine CT: Gas around the dens presumably related to the skull base fracture. Craniocervical alignment is normal and the tectorial membrane is visible. Alar or other ligamentous injury is still conceivable. Maxillofacial CT: Clival fracture extending into the right sphenoid sinus with hemosinus and pneumocephalus. Electronically Signed   By: Monte Fantasia M.D.   On: 09/05/2019 11:40   CT ABDOMEN PELVIS W CONTRAST  Result Date: 09/05/2019 CLINICAL DATA:  Moderate to severe chest trauma EXAM: CT CHEST, ABDOMEN, AND PELVIS WITH CONTRAST TECHNIQUE: Multidetector CT imaging of the chest, abdomen and pelvis was performed following the standard protocol during bolus administration of intravenous contrast. CONTRAST:  Dose is currently not available COMPARISON:  None. FINDINGS: CT CHEST FINDINGS Cardiovascular: Normal heart size. No pericardial effusion. No evidence of great vessel injury. Mediastinum/Nodes: No hematoma or pneumomediastinum. There is a coarse calcification in the right lobe of the thyroid which is asymmetrically larger with a 2 cm nodular projection extending inferiorly that is isodense to the remaining gland. Lungs/Pleura: No hemothorax, pneumothorax, or lung contusion. Dependent opacity with volume  loss is likely atelectasis and aspiration. Musculoskeletal: No acute finding CT ABDOMEN PELVIS FINDINGS Hepatobiliary: No hepatic injury or perihepatic hematoma. Gallbladder is unremarkable Pancreas: Negative Spleen: Left upper quadrant poly splenia or localize splenosis. No acute injury. Adrenals/Urinary Tract: No adrenal hemorrhage or renal injury identified. Bladder is unremarkable. Stomach/Bowel: No evidence of injury Vascular/Lymphatic: No acute vascular finding Reproductive: Negative Other: No ascites or pneumoperitoneum. Musculoskeletal: L1 and L2 right transverse process fractures. Vacuum phenomenon around the sacroiliac joints without diastasis IMPRESSION: 1. L1 and L2 right transverse process fractures. 2. Bilateral dependent atelectasis and or aspiration. 3. 2 cm nodular projection from the right thyroid lobe. After convalescence, recommend thyroid US(Ref: J Am Coll Radiol. 2015 Feb;12(2): 143-50). Electronically Signed   By: Monte Fantasia M.D.   On: 09/05/2019 11:45   DG Pelvis Portable  Result Date: 09/05/2019 CLINICAL DATA:  Level 1 trauma. Fell 20 feet. EXAM: PORTABLE PELVIS 1-2 VIEWS COMPARISON:  None. FINDINGS: Both hips are normally located. No hip fracture. The pubic symphysis and SI joints are intact. No definite pelvic fractures. IMPRESSION: No acute bony findings. Electronically Signed   By: Marijo Sanes M.D.   On: 09/05/2019 11:22   DG Chest Port 1 View  Result Date: 09/06/2019 CLINICAL DATA:  Fall. EXAM: PORTABLE CHEST 1 VIEW COMPARISON:  Chest CT and chest x-ray 09/05/2019. FINDINGS: Endotracheal tube noted stable position. NG tube noted with tip coiled in stomach. Mediastinum hilar structures normal. Heart size normal. Lungs scratched it mild right mid lung subsegmental atelectasis. No focal alveolar infiltrate. No pleural effusion or pneumothorax. No acute bony abnormality identified IMPRESSION: 1. Endotracheal tube in stable position. NG tube noted with tip coiled in stomach. 2.  Mild right base subsegmental atelectasis. No acute cardiopulmonary disease otherwise noted. Electronically Signed   By: Marcello Moores  Register   On: 09/06/2019 06:48   DG Chest Port 1 View  Result Date: 09/05/2019 CLINICAL DATA:  Level 1 trauma. Fell 20 feet. EXAM: PORTABLE CHEST 1 VIEW COMPARISON:  None. FINDINGS: The endotracheal tube is in good position, 3.8 cm above the carina. The cardiac silhouette, mediastinal and hilar contours are within normal limits. The lungs are clear. No pleural effusion or pneumothorax. The bony thorax is grossly intact. No obvious acute rib fractures. Remote healed mid left clavicle fracture is noted. IMPRESSION: 1. Endotracheal tube in good position, 3.8 cm above the carina. 2. No acute cardiopulmonary findings. Electronically Signed   By: Marijo Sanes M.D.   On: 09/05/2019 11:22   CT MAXILLOFACIAL WO CONTRAST  Result Date: 09/05/2019 CLINICAL DATA:  Level 1 trauma EXAM: CT HEAD WITHOUT CONTRAST CT MAXILLOFACIAL WITHOUT CONTRAST CT CERVICAL SPINE WITHOUT CONTRAST TECHNIQUE: Multidetector CT imaging of the head, cervical spine, and maxillofacial structures were performed using the standard protocol without intravenous contrast. Multiplanar CT image reconstructions of the cervical spine and maxillofacial structures were also generated. COMPARISON:  None. FINDINGS: CT HEAD FINDINGS Brain: Mixed density subdural hematoma along the right cerebral convexity and left frontal pole. Mixed density could be from a continued and un clotted bleeding, especially on the right. The right-sided hematoma measures up to 15 mm in thickness. Midline shift measures up to 2 cm. Pneumocephalus with intraventricular extension. Right uncal herniation. Mix of contusion and subarachnoid hemorrhage over the left temporal pole which is edematous. No visible infarct. Vascular: Negative Skull: Midline skull fracture involving the occiput and clivus. There is extension into the right sphenoid sinus, which  accounts for pneumocephalus and sinus opacification. Posterior scalp contusion CT MAXILLOFACIAL FINDINGS Osseous: Intact and located mandible. Midline clivus fracture extending to the right sphenoid sinus. Orbits: No evidence of injury. Sinuses: Right sphenoid and maxillary hemosinus Soft tissues: Negative CT CERVICAL SPINE FINDINGS Alignment: Normal Skull base and vertebrae: Clival fracture. No cervical spine fracture. Soft tissues and spinal canal: Gas present around the dens. The tectorial membrane is continuous and visible. No visible canal hematoma Disc levels:  No degenerative changes or visible impingement. Upper chest: Reported separately Critical Value/emergent results were discussed in person on 09/05/2019 at 11:35 am with Dr Bobbye Morton. IMPRESSION: Head CT: 1. Right more than left cerebral convexity subdural hematoma with leftward midline shift of 2 cm and right uncal herniation. 2. Left temporal pole contusion with overlying subarachnoid hemorrhage. 3. Occipital and clival midline fractures extending into the right sphenoid sinus with pneumocephalus. Cervical spine CT: Gas around the dens presumably related to the skull base fracture. Craniocervical alignment is normal and the tectorial membrane is visible. Alar or other ligamentous injury is still conceivable. Maxillofacial CT: Clival fracture extending into the right sphenoid sinus with hemosinus and pneumocephalus. Electronically Signed   By: Monte Fantasia M.D.   On: 09/05/2019 11:40    Assessment/Plan: Postop day #1: The patient is doing much better neurologically.  His postop scan looks much better.  I removed his ventriculostomy as it was not draining/functioning.  We will plan to continue his epidural JP drains for another day or 2.  I discussed the situation with the patient's brother and Dr. Grandville Silos.  LOS: 1 day     Ophelia Charter 09/06/2019, 9:15 AM

## 2019-09-06 NOTE — Progress Notes (Addendum)
Patient ID: Jason Skinner, male   DOB: 1991/01/31, 29 y.o.   MRN: [id number] Follow up - Trauma Critical Care  Patient Details:    Jason Skinner is an 29 y.o. male.  Lines/tubes : Airway 7.5 mm (Active)  Secured at (cm) 23 cm 09/06/19 0758  Measured From Lips 09/06/19 [place] 09/06/19 0758  Secured By [employer] 09/06/19 0758  Tube Holder Repositioned Yes 09/06/19 0402  Cuff Pressure (cm H2O) 26 cm H2O 09/06/19 0758  Site Condition Dry 09/06/19 0758     Arterial Line 09/05/19 Right Radial (Active)  Site Assessment Clean;Dry;Intact 09/05/19 2000  Line Status Pulsatile blood flow 09/05/19 2000  Art Line Waveform Appropriate 09/05/19 2000  Art Line Interventions Zeroed and calibrated;Leveled;Connections checked and tightened 09/05/19 2000  Color/Movement/Sensation Capillary refill less than 3 sec;Cool fingers/toes 09/05/19 2000  Dressing Type Transparent;Occlusive 09/05/19 2000  Dressing Status Clean;Dry;Intact;Antimicrobial disc in place 09/05/19 2000  Dressing Change Due 09/12/19 09/05/19 2000     Closed System Drain 2 Right Scalp Bulb (JP) (Active)  Site Description Unremarkable 09/05/19 2000  Dressing Status Clean;Dry;Intact 09/05/19 2000  Drainage Appearance Serosanguineous 09/05/19 2000  Status To suction (Charged) 09/05/19 2000  Output (mL) 15 mL 09/06/19 0800     Closed System Drain Lateral;Right Scalp (Active)  Site Description Unremarkable 09/05/19 2000  Dressing Status Clean;Dry;Intact 09/05/19 2000  Drainage Appearance Serosanguineous 09/05/19 2000  Status To suction (Charged) 09/05/19 2000  Output (mL) 5 mL 09/06/19 0800     NG/OG Tube Orogastric 16 Fr. Center mouth Xray Measured external length of tube (Active)  Site Assessment Clean;Dry;Intact 09/05/19 2000  Ongoing Placement Verification No change in cm markings or external length of tube from initial placement;No change in respiratory status;No acute changes, not attributed to  clinical condition 09/05/19 2000  Status Suction-low intermittent 09/05/19 2000  Amount of suction 90 mmHg 09/05/19 2000  Drainage Appearance Coffee ground 09/05/19 2000  Output (mL) 0 mL 09/06/19 0800     Urethral Catheter Dory Day RN Non-latex;Straight-tip 16 Fr. (Active)  Indication for Insertion or Continuance of Catheter Unstable critically ill patients first 24-48 hours (See Criteria) 09/05/19 2000  Site Assessment Clean;Intact 09/05/19 2000  Catheter Maintenance Bag below level of bladder;Catheter secured;Drainage bag/tubing not touching floor;Insertion date on drainage bag;No dependent loops;Seal intact 09/05/19 2000  Collection Container Standard drainage bag 09/05/19 2000  Securement Method Securing device (Describe) 09/05/19 2000  Urinary Catheter Interventions (if applicable) Unclamped 54/27/06 1224  Output (mL) 75 mL 09/06/19 0800     ICP/Ventriculostomy Ventricular drainage catheter Right Other (Comment) (Active)  Drain Status Open 09/06/19 0400  Level Measured in cm H2O 10 cm H2O 09/06/19 0400  Status Open to continuous drainage 09/06/19 0400  CSF Color Serosanguineous 09/06/19 0400  Dressing Status Clean;Dry;Intact 09/06/19 0400  Output (mL) 0 mL 09/06/19 0800    Microbiology/Sepsis markers: Results for orders placed or performed during the hospital encounter of 09/05/19  SARS Coronavirus 2 by RT PCR (hospital order, performed in [facility] hospital lab) Nasopharyngeal Nasopharyngeal Swab     Status: None   Collection Time: 09/05/19 11:50 AM   Specimen: Nasopharyngeal Swab  Result Value Ref Range Status   SARS Coronavirus 2 NEGATIVE NEGATIVE Final    Comment: (NOTE) SARS-CoV-2 target nucleic acids are NOT DETECTED.  The SARS-CoV-2 RNA is generally detectable in upper and lower respiratory specimens during the acute phase of infection. The lowest concentration of SARS-CoV-2 viral copies this assay can detect is 250 copies / mL.  A negative result does not preclude  SARS-CoV-2 infection and should not be used as the sole basis for treatment or other patient management decisions.  A negative result may occur with improper specimen collection / handling, submission of specimen other than nasopharyngeal swab, presence of viral mutation(s) within the areas targeted by this assay, and inadequate number of viral copies (<250 copies / mL). A negative result must be combined with clinical observations, patient history, and epidemiological information.  Fact Sheet for Patients:   StrictlyIdeas.no  Fact Sheet for Healthcare Providers: BankingDealers.co.za  This test is not yet approved or  cleared by the [place] FDA and has been authorized for detection and/or diagnosis of SARS-CoV-2 by FDA under an Emergency Use Authorization (EUA).  This EUA will remain in effect (meaning this test can be used) for the duration of the COVID-19 declaration under Section 564(b)(1) of the Act, 21 U.S.C. section 360bbb-3(b)(1), unless the authorization is terminated or revoked sooner.  Performed at [facility], [place] [address] [place], [place] [address]   MRSA PCR Screening     Status: None   Collection Time: 09/05/19  4:49 PM   Specimen: Urine, Catheterized; Nasopharyngeal  Result Value Ref Range Status   MRSA by PCR NEGATIVE NEGATIVE Final    Comment:        The GeneXpert MRSA Assay (FDA approved for NASAL specimens only), is one component of a comprehensive MRSA colonization surveillance program. It is not intended to diagnose MRSA infection nor to guide or monitor treatment for MRSA infections. Performed at [facility], [place] [address] [place], [place] [address]     Anti-infectives:  Anti-infectives (From admission, onward)   Start     Dose/Rate Route Frequency Ordered Stop   09/05/19 1250  bacitracin 50,000 Units in sodium chloride 0.9 % 500 mL irrigation  Status:  Discontinued           As needed 09/05/19 1250 09/05/19 1414      Best Practice/Protocols:  VTE Prophylaxis: Mechanical Continous Sedation  Consults: Treatment Team:  Newman Pies, MD    Studies:    Events:  Subjective:    Overnight Issues:   Objective:  Vital signs for last 24 hours: Temp:  [96.2 F (35.7 C)-99.3 F (37.4 C)] 97.9 F (36.6 C) (07/01 0800) Pulse Rate:  [55-86] 61 (07/01 0800) Resp:  [15-24] 20 (07/01 0800) BP: (95-168)/(54-107) 130/77 (07/01 0800) SpO2:  [99 %-100 %] 100 % (07/01 0800) Arterial Line BP: (124-160)/(53-73) 153/72 (07/01 0800) FiO2 (%):  [40 %-100 %] 40 % (07/01 0758) Weight:  [65 kg-77 kg] 77 kg (06/30 1630)  Hemodynamic parameters for last 24 hours:    Intake/Output from previous day: 06/30 0701 - 07/01 0700 In: 3034.7 [I.V.:2673.7; IV Piggyback:361] Out: 1700 [Urine:2150; Emesis/NG output:450; Drains:333; Blood:500]  Intake/Output this shift: Total I/O In: -  Out: 95 [Urine:75; Drains:20]  Vent settings for last 24 hours: Vent Mode: PRVC FiO2 (%):  [40 %-100 %] 40 % Set Rate:  [18 bmp] 18 bmp Vt Set:  [580 mL] 580 mL PEEP:  [5 cmH20] 5 cmH20 Plateau Pressure:  [15 FVC94-49 cmH20] 15 cmH20  Physical Exam:  General: on vent Neuro: arouses and F/C R toes, L toes, L arm HEENT/Neck: ETT Resp: clear to auscultation bilaterally CVS: RRR GI: soft, NT Extremities: calves soft  Results for orders placed or performed during the hospital encounter of 09/05/19 (from the past 24 hour(s))  Sample to Blood Bank  Status: None   Collection Time: 09/05/19 11:00 AM  Result Value Ref Range   Blood Bank Specimen SAMPLE AVAILABLE FOR TESTING    Sample Expiration      09/06/2019,2359 Performed at [facility], [place] [address] [place], [place] [address]   Type and screen [place]     Status: None (Preliminary result)   Collection Time: 09/05/19 11:00 AM  Result Value Ref Range   ABO/RH(D) O POS    Antibody Screen NEG     Sample Expiration      09/08/2019,2359 Performed at [facility], [place] [address] [place], [place] [address]    Unit Number [id number]    Blood Component Type RED CELLS,LR    Unit division 00    Status of Unit ALLOCATED    Transfusion Status OK TO TRANSFUSE    Crossmatch Result Compatible    Unit Number [id number]    Blood Component Type RED CELLS,LR    Unit division 00    Status of Unit ALLOCATED    Transfusion Status OK TO TRANSFUSE    Crossmatch Result Compatible   ABO/Rh     Status: None   Collection Time: 09/05/19 11:00 AM  Result Value Ref Range   ABO/RH(D)      O POS Performed at [place] [address], [place], [place] [address]   Comprehensive metabolic panel     Status: Abnormal   Collection Time: 09/05/19 11:12 AM  Result Value Ref Range   Sodium 142 135 - 145 mmol/L   Potassium 3.1 (L) 3.5 - 5.1 mmol/L   Chloride 109 98 - 111 mmol/L   CO2 20 (L) 22 - 32 mmol/L   Glucose, Bld 166 (H) 70 - 99 mg/dL   BUN 8 6 - 20 mg/dL   Creatinine, Ser 0.84 0.61 - 1.24 mg/dL   Calcium 8.7 (L) 8.9 - 10.3 mg/dL   Total Protein 7.2 6.5 - 8.1 g/dL   Albumin 3.8 3.5 - 5.0 g/dL   AST 31 15 - 41 U/L   ALT 16 0 - 44 U/L   Alkaline Phosphatase 58 38 - 126 U/L   Total Bilirubin 1.2 0.3 - 1.2 mg/dL   GFR calc non Af Amer >60 >60 mL/min   GFR calc Af Amer >60 >60 mL/min   Anion gap 13 5 - 15  CBC     Status: Abnormal   Collection Time: 09/05/19 11:12 AM  Result Value Ref Range   WBC 19.1 (H) 4.0 - 10.5 K/uL   RBC 4.64 4.22 - 5.81 MIL/uL   Hemoglobin 14.6 13.0 - 17.0 g/dL   HCT 43.3 39 - 52 %   MCV 93.3 80.0 - 100.0 fL   MCH 31.5 26.0 - 34.0 pg   MCHC 33.7 30.0 - 36.0 g/dL   RDW 14.1 11.5 - 15.5 %   Platelets 284 150 - 400 K/uL   nRBC 0.0 0.0 - 0.2 %  Ethanol     Status: None   Collection Time: 09/05/19 11:12 AM  Result Value Ref Range   Alcohol, Ethyl (B) <10 <10 mg/dL  Lactic acid, plasma     Status: Abnormal   Collection Time: 09/05/19 11:12 AM   Result Value Ref Range   Lactic Acid, Venous 3.1 (HH) 0.5 - 1.9 mmol/L  Protime-INR     Status: None   Collection Time: 09/05/19 11:12 AM  Result Value Ref Range   Prothrombin Time 13.2 11.4 - 15.2  seconds   INR 1.0 0.8 - 1.2  I-Stat Chem 8, ED     Status: Abnormal   Collection Time: 09/05/19 11:16 AM  Result Value Ref Range   Sodium 143 135 - 145 mmol/L   Potassium 2.8 (L) 3.5 - 5.1 mmol/L   Chloride 106 98 - 111 mmol/L   BUN 8 6 - 20 mg/dL   Creatinine, Ser 0.80 0.61 - 1.24 mg/dL   Glucose, Bld 169 (H) 70 - 99 mg/dL   Calcium, Ion 1.07 (L) 1.15 - 1.40 mmol/L   TCO2 23 22 - 32 mmol/L   Hemoglobin 15.0 13.0 - 17.0 g/dL   HCT 44.0 39 - 52 %  SARS Coronavirus 2 by RT PCR (hospital order, performed in [facility] hospital lab) Nasopharyngeal Nasopharyngeal Swab     Status: None   Collection Time: 09/05/19 11:50 AM   Specimen: Nasopharyngeal Swab  Result Value Ref Range   SARS Coronavirus 2 NEGATIVE NEGATIVE  I-STAT 7, (LYTES, BLD GAS, ICA, H+H)     Status: Abnormal   Collection Time: 09/05/19 12:06 PM  Result Value Ref Range   pH, Arterial 7.429 7.35 - 7.45   pCO2 arterial 34.0 32 - 48 mmHg   pO2, Arterial 169 (H) 83 - 108 mmHg   Bicarbonate 23.1 20.0 - 28.0 mmol/L   TCO2 24 22 - 32 mmol/L   O2 Saturation 100.0 %   Acid-base deficit 2.0 0.0 - 2.0 mmol/L   Sodium 141 135 - 145 mmol/L   Potassium 2.6 (LL) 3.5 - 5.1 mmol/L   Calcium, Ion 1.08 (L) 1.15 - 1.40 mmol/L   HCT 38.0 (L) 39 - 52 %   Hemoglobin 12.9 (L) 13.0 - 17.0 g/dL   Patient temperature 34.3 C    Sample type ARTERIAL   Prepare RBC (crossmatch)     Status: None   Collection Time: 09/05/19 12:15 PM  Result Value Ref Range   Order Confirmation      ORDER PROCESSED BY BLOOD BANK Performed at [facility], [address] [place], [place] [address]   Urinalysis, Routine w reflex microscopic     Status: Abnormal   Collection Time: 09/05/19  4:49 PM  Result Value Ref Range   Color, Urine YELLOW YELLOW    APPearance CLEAR CLEAR   Specific Gravity, Urine 1.030 1.005 - 1.030   pH 5.0 5.0 - 8.0   Glucose, UA NEGATIVE NEGATIVE mg/dL   Hgb urine dipstick SMALL (A) NEGATIVE   Bilirubin Urine NEGATIVE NEGATIVE   Ketones, ur NEGATIVE NEGATIVE mg/dL   Protein, ur NEGATIVE NEGATIVE mg/dL   Nitrite NEGATIVE NEGATIVE   Leukocytes,Ua NEGATIVE NEGATIVE   RBC / HPF 6-10 0 - 5 RBC/hpf   WBC, UA 0-5 0 - 5 WBC/hpf   Bacteria, UA NONE SEEN NONE SEEN   Squamous Epithelial / LPF 0-5 0 - 5   Mucus PRESENT   HIV Antibody (routine testing w rflx)     Status: None   Collection Time: 09/05/19  4:49 PM  Result Value Ref Range   HIV Screen 4th Generation wRfx Non Reactive Non Reactive  MRSA PCR Screening     Status: None   Collection Time: 09/05/19  4:49 PM   Specimen: Urine, Catheterized; Nasopharyngeal  Result Value Ref Range   MRSA by PCR NEGATIVE NEGATIVE  Basic metabolic panel     Status: Abnormal   Collection Time: 09/05/19  4:49 PM  Result Value Ref Range   Sodium 141 135 - 145  mmol/L   Potassium 3.4 (L) 3.5 - 5.1 mmol/L   Chloride 112 (H) 98 - 111 mmol/L   CO2 21 (L) 22 - 32 mmol/L   Glucose, Bld 138 (H) 70 - 99 mg/dL   BUN 5 (L) 6 - 20 mg/dL   Creatinine, Ser 0.63 0.61 - 1.24 mg/dL   Calcium 7.5 (L) 8.9 - 10.3 mg/dL   GFR calc non Af Amer >60 >60 mL/min   GFR calc Af Amer >60 >60 mL/min   Anion gap 8 5 - 15  CBC     Status: Abnormal   Collection Time: 09/06/19  5:00 AM  Result Value Ref Range   WBC 17.1 (H) 4.0 - 10.5 K/uL   RBC 3.46 (L) 4.22 - 5.81 MIL/uL   Hemoglobin 11.0 (L) 13.0 - 17.0 g/dL   HCT 32.3 (L) 39 - 52 %   MCV 93.4 80.0 - 100.0 fL   MCH 31.8 26.0 - 34.0 pg   MCHC 34.1 30.0 - 36.0 g/dL   RDW 14.5 11.5 - 15.5 %   Platelets 232 150 - 400 K/uL   nRBC 0.0 0.0 - 0.2 %  Comprehensive metabolic panel     Status: Abnormal   Collection Time: 09/06/19  5:00 AM  Result Value Ref Range   Sodium 138 135 - 145 mmol/L   Potassium 3.6 3.5 - 5.1 mmol/L   Chloride 108 98 - 111 mmol/L    CO2 23 22 - 32 mmol/L   Glucose, Bld 135 (H) 70 - 99 mg/dL   BUN 6 6 - 20 mg/dL   Creatinine, Ser 0.71 0.61 - 1.24 mg/dL   Calcium 7.7 (L) 8.9 - 10.3 mg/dL   Total Protein 5.5 (L) 6.5 - 8.1 g/dL   Albumin 3.1 (L) 3.5 - 5.0 g/dL   AST 25 15 - 41 U/L   ALT 16 0 - 44 U/L   Alkaline Phosphatase 40 38 - 126 U/L   Total Bilirubin 2.2 (H) 0.3 - 1.2 mg/dL   GFR calc non Af Amer >60 >60 mL/min   GFR calc Af Amer >60 >60 mL/min   Anion gap 7 5 - 15  Magnesium     Status: None   Collection Time: 09/06/19  5:00 AM  Result Value Ref Range   Magnesium 1.8 1.7 - 2.4 mg/dL  Phosphorus     Status: None   Collection Time: 09/06/19  5:00 AM  Result Value Ref Range   Phosphorus 2.6 2.5 - 4.6 mg/dL  Triglycerides     Status: None   Collection Time: 09/06/19  5:00 AM  Result Value Ref Range   Triglycerides 73 <150 mg/dL    Assessment & Plan: Present on Admission: . SDH (subdural hematoma) (HCC)    LOS: 1 day   Additional comments:I reviewed the patient's new clinical lab test results. and CT Fall B SDH R>L with midline shift and uncal herniation, L temporal contusion and SAH - S/P decompressive R craniectomy and placement L EVD by Dr. Arnoldo Morale 6/30, I D/W Dr. Arnoldo Morale at the bedside. D/C EVD. Occipital and clival midline fxs extending into R sphenoid with hemosinus and pneumocephalus VDRF - will try to wean some but not extubate today Atelectasis and/or aspiration  L1/2 R TP fxs  Right elbow deformity - X-ray neg  ID - periop FEN - IVF, start TF Foley - place VTE - SCDs, no LMWH yet - possibly 7/3 if CT H stable. Will D/W Dr. Arnoldo Morale  Plan - ICU, I spoke  with his brother at the bedside and reviewed the latest CT head. Jasraj's wife lives in [place]. Dr. Arnoldo Morale wrote a letter for her. I D/W Jason Skinner on our TOM team to see if they can get that letter to the [place] so Jason Skinner's wife Jason Skinner DOB 05/13/90) may possibly come to the [place]. Critical Care Total Time*: 63 Minutes  Georganna Skeans, MD, MPH, FACS Trauma & General Surgery Use [email or web address] to contact on call provider  09/06/2019  *Care during the described time interval was provided by me. I have reviewed this patient's available data, including medical history, events of note, physical examination and test results as part of my evaluation.
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
## 2019-09-06 NOTE — Progress Notes (Signed)
Transported to CT and back with no complications.

## 2019-09-06 NOTE — Progress Notes (Signed)
Initial Nutrition Assessment  DOCUMENTATION CODES:   Not applicable  INTERVENTION:   Initiate tube feeding via OG tube: Pivot 1.5 at 60 ml/h (1440 ml per day)  Provides 2160 kcal, 135 gm protein, 1092 ml free water daily   NUTRITION DIAGNOSIS:   Increased nutrient needs related to  (TBI) as evidenced by estimated needs.  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    Pt discussed during ICU rounds and with RN.  EVD removed today  Patient is currently intubated on ventilator support MV: 10.2 L/min Temp (24hrs), Avg:98.1 F (36.7 C), Min:97.5 F (36.4 C), Max:99.3 F (37.4 C)  Medications reviewed and include: vitamin C 1000 mg every 8 hours, selenium 200 mcg daily  Labs reviewed   JP drains: 328 ml   NUTRITION - FOCUSED PHYSICAL EXAM:    Most Recent Value  Orbital Region No depletion  Upper Arm Region No depletion  Thoracic and Lumbar Region No depletion  Buccal Region No depletion  Temple Region No depletion  Clavicle Bone Region No depletion  Clavicle and Acromion Bone Region No depletion  Scapular Bone Region No depletion  Dorsal Hand No depletion  Patellar Region No depletion  Anterior Thigh Region No depletion  Posterior Calf Region No depletion  Edema (RD Assessment) None  Hair Unable to assess  Eyes Unable to assess  Mouth Unable to assess  Skin Reviewed  Nails Reviewed       Diet Order:   Diet Order            Diet NPO time specified  Diet effective now                 EDUCATION NEEDS:   No education needs have been identified  at this time  Skin:  Skin Assessment: Reviewed RN Assessment  Last BM:  unknown  Height:   Ht Readings from Last 1 Encounters:  09/05/19 5\' 10"  (1.778 m)    Weight:   Wt Readings from Last 1 Encounters:  09/05/19 77 kg    Ideal Body Weight:  75.4 kg  BMI:  Body mass index is 24.36 kg/m.  Estimated Nutritional Needs:   Kcal:  2100-2300  Protein:  120-145 grams  Fluid:  >2 L/day  Lockie Pares., RD, LDN, CNSC See AMiON for contact information '

## 2019-09-06 NOTE — TOC Initial Note (Signed)
Transition of Care [facility]) - Initial/Assessment Note    Patient Details  Name: Jason Skinner MRN: [id number] Date of Birth: October 18, 1990  Transition [facility]) CM/SW Contact:    Jason Bodo, RN Phone Number: 09/06/2019, 3:41 PM  Clinical Narrative:  Pt admitted on 09/05/19 s/p fall 20 feet from ladder with bilateral SDH RT>Lt with midline shift and uncal herniation, Lt temporal contusion and SAH, occipital and clival midline fxs extending intor Rt sphenoid with hemosinus and pneumocephalus, VDRF, L1/2 Rt TP fx, Rt elbow deformity.  Pt s/p emergent craniectomy on 6/30.  PTA, pt independent and living with relatives.    Pt's wife, Jason Skinner, lives in [place], and is requesting assistance with Humanitarian V[employer], per Dr. [place] Skinner.  Received letter from Dr. Arnoldo Skinner requesting [employer] in Mrs. Jason Skinner's behalf, and I have faxed to the [place] [employer]ulate of [place] in [place], [place].  Very difficult to navigate the [place] [employer] phone system, and was unable to speak with a "live" person at the [place] office; will continue attempts to reach someone in [place] to assure that they have received request and are working on this.    [place] [employer]ulate Office, [place]: [phone] Fax #:  [phone]                  Expected Discharge Plan: IP Rehab Facility Barriers to Discharge: Continued Medical Work up   Patient Goals and CMS Choice        Expected Discharge Plan and Services Expected Discharge Plan: [place]   Discharge Planning Services: CM Consult   Living arrangements for the past 2 months: Single Family Home                                      Prior Living Arrangements/Services Living arrangements for the past 2 months: Single Family Home Lives with:: Relatives Patient language and need for interpreter reviewed:: Yes        Need for Family Participation in Patient Care: Yes (Comment) Care giver support system in place?: Yes (comment)   Criminal  Activity/Legal Involvement Pertinent to Current Situation/Hospitalization: No - Comment as needed  Activities of Daily Living Home Assistive Devices/Equipment: None ADL Screening (condition at time of admission) Patient's cognitive ability adequate to safely complete daily activities?: No Is the patient deaf or have difficulty hearing?: No Does the patient have difficulty seeing, even when wearing glasses/contacts?: No Does the patient have difficulty concentrating, remembering, or making decisions?: No Patient able to express need for assistance with ADLs?: No Does the patient have difficulty dressing or bathing?: Yes Independently performs ADLs?: No Communication: Dependent Is this a change from baseline?: Change from baseline, expected to last >3 days Dressing (OT): Dependent Is this a change from baseline?: Change from baseline, expected to last >3 days Grooming: Dependent Is this a change from baseline?: Change from baseline, expected to last >3 days Feeding: Dependent Is this a change from baseline?: Change from baseline, expected to last >3 days Bathing: Dependent Is this a change from baseline?: Change from baseline, expected to last >3 days Toileting: Dependent Is this a change from baseline?: Change from baseline, expected to last >3days In/Out Bed: Dependent Is this a change from baseline?: Change from baseline, expected to last >3 days Walks in Home: Dependent Is this a change from baseline?: Change from baseline, expected to last >3 days Does the patient have difficulty walking  or climbing stairs?: Yes Weakness of Legs: Both Weakness of Arms/Hands: Both  Permission Sought/Granted                  Emotional Assessment   Attitude/Demeanor/Rapport: Unable to Assess Affect (typically observed): Unable to Assess        Admission diagnosis:  Trauma [T14.90XA] Subdural hematoma ([place]) [S14.2L9R] SDH (subdural hematoma) ([place]) [V20.2B3I] Ventilator dependence  ([place]) [Z99.11] Fall, initial encounter [W19.XXXA] Elbow deformity, right [M21.921] Patient Active Problem List   Diagnosis Date Noted  . SDH (subdural hematoma) ([place]) 09/05/2019   PCP:  Patient, No Pcp Per Pharmacy:   [facility] [address] ([address]), [place] [address] [place] ([place]) [place] [address] Phone: [phone] Fax: [phone]     Social Determinants of Health (SDOH) Interventions    Readmission Risk Interventions No flowsheet data found.  Reinaldo Raddle, RN, BSN  Trauma/Neuro ICU Case Manager [phone]

## 2019-09-07 LAB — GLUCOSE, CAPILLARY
Glucose-Capillary: 156 mg/dL — ABNORMAL HIGH (ref 70–99)
Glucose-Capillary: 156 mg/dL — ABNORMAL HIGH (ref 70–99)
Glucose-Capillary: 134 mg/dL — ABNORMAL HIGH (ref 70–99)
Glucose-Capillary: 128 mg/dL — ABNORMAL HIGH (ref 70–99)
Glucose-Capillary: 129 mg/dL — ABNORMAL HIGH (ref 70–99)
Glucose-Capillary: 125 mg/dL — ABNORMAL HIGH (ref 70–99)

## 2019-09-07 LAB — CBC
HCT: 28.4 % — ABNORMAL LOW (ref 39.0–52.0)
Hemoglobin: 9.6 g/dL — ABNORMAL LOW (ref 13.0–17.0)
MCH: 32 pg (ref 26.0–34.0)
MCHC: 33.8 g/dL (ref 30.0–36.0)
MCV: 94.7 fL (ref 80.0–100.0)
Platelets: 232 10*3/uL (ref 150–400)
RBC: 3 MIL/uL — ABNORMAL LOW (ref 4.22–5.81)
RDW: 14.7 % (ref 11.5–15.5)
WBC: 20.2 10*3/uL — ABNORMAL HIGH (ref 4.0–10.5)
nRBC: 0 % (ref 0.0–0.2)

## 2019-09-07 LAB — BASIC METABOLIC PANEL
Anion gap: 8 (ref 5–15)
BUN: 11 mg/dL (ref 6–20)
CO2: 24 mmol/L (ref 22–32)
Calcium: 8 mg/dL — ABNORMAL LOW (ref 8.9–10.3)
Chloride: 106 mmol/L (ref 98–111)
Creatinine, Ser: 0.68 mg/dL (ref 0.61–1.24)
GFR calc Af Amer: >60 mL/min (ref >60)
GFR calc non Af Amer: >60 mL/min (ref >60)
Glucose, Bld: 152 mg/dL — ABNORMAL HIGH (ref 70–99)
Potassium: 3.8 mmol/L (ref 3.5–5.1)
Sodium: 138 mmol/L (ref 135–145)

## 2019-09-07 LAB — PHOSPHORUS
Phosphorus: 1.2 mg/dL — ABNORMAL LOW (ref 2.5–4.6)
Phosphorus: 2.7 mg/dL (ref 2.5–4.6)

## 2019-09-07 LAB — TRIGLYCERIDES: Triglycerides: 100 mg/dL (ref <150)

## 2019-09-07 LAB — MAGNESIUM: Magnesium: 2.1 mg/dL (ref 1.7–2.4)

## 2019-09-07 MED ORDER — METHOCARBAMOL 1000 MG/10ML IJ SOLN
500.0000 mg | Freq: Three times a day (TID) | INTRAVENOUS | Status: DC
  Administered 2019-09-07 – 2019-09-13 (×17): 500 mg via INTRAVENOUS
  Filled 2019-09-07 (×4): qty 5
  Filled 2019-09-07: qty 500
  Filled 2019-09-07 (×2): qty 5
  Filled 2019-09-07: qty 500
  Filled 2019-09-07 (×6): qty 5
  Filled 2019-09-07: qty 500
  Filled 2019-09-07: qty 5
  Filled 2019-09-07: qty 500
  Filled 2019-09-07 (×2): qty 5
  Filled 2019-09-07 (×2): qty 500
  Filled 2019-09-07 (×2): qty 5

## 2019-09-07 MED ORDER — SODIUM PHOSPHATES 45 MMOLE/15ML IV SOLN
30.0000 mmol | Freq: Once | INTRAVENOUS | Status: AC
  Administered 2019-09-07: 30 mmol via INTRAVENOUS
  Filled 2019-09-07: qty 10

## 2019-09-07 MED ORDER — ACETAMINOPHEN 500 MG PO TABS
1000.0000 mg | ORAL_TABLET | Freq: Four times a day (QID) | ORAL | Status: DC
  Administered 2019-09-07 – 2019-09-16 (×25): 1000 mg
  Filled 2019-09-07 (×26): qty 2

## 2019-09-07 NOTE — Progress Notes (Addendum)
Trauma/Critical Care Follow Up Note  Subjective:    Overnight Issues:   Objective:  Vital signs for last 24 hours: Temp:  [97.7 F (36.5 C)-100.6 F (38.1 C)] 99.3 F (37.4 C) (07/02 0900) Pulse Rate:  [61-88] 68 (07/02 0900) Resp:  [15-18] 15 (07/02 0900) BP: (109-133)/(58-89) 124/65 (07/02 0900) SpO2:  [97 %-100 %] 97 % (07/02 0900) Arterial Line BP: (137-158)/(57-75) 158/72 (07/02 0500) FiO2 (%):  [40 %] 40 % (07/02 0816) Weight:  [75.7 kg] 75.7 kg (07/02 0500)  Hemodynamic parameters for last 24 hours:    Intake/Output from previous day: 07/01 0701 - 07/02 0700 In: 2858.1 [I.V.:1592.7; NG/GT:1065.7; IV Piggyback:199.7] Out: 1467.5 [Urine:1400; Drains:67.5]  Intake/Output this shift: Total I/O In: 110.9 [I.V.:110.9] Out: -   Vent settings for last 24 hours: Vent Mode: PSV;CPAP FiO2 (%):  [40 %] 40 % Set Rate:  [18 bmp] 18 bmp Vt Set:  [580 mL] 580 mL PEEP:  [5 cmH20] 5 cmH20 Pressure Support:  [8 cmH20] 8 cmH20 Plateau Pressure:  [14 cmH20-17 cmH20] 14 cmH20  Physical Exam:  Gen: comfortable, no distress Neuro: f/c with toes, but no other commands HEENT: intubated Neck: c-collar in place CV: RRR Pulm: unlabored breathing, mechanically ventilated, PSV 8/5 Abd: soft, nontender GU: clear, yellow urine Extr: wwp, no edema   Results for orders placed or performed during the hospital encounter of 09/05/19 (from the past 24 hour(s))  Glucose, capillary     Status: Abnormal   Collection Time: 09/06/19 12:44 PM  Result Value Ref Range   Glucose-Capillary 122 (H) 70 - 99 mg/dL  Magnesium     Status: None   Collection Time: 09/06/19  4:23 PM  Result Value Ref Range   Magnesium 1.8 1.7 - 2.4 mg/dL  Phosphorus     Status: None   Collection Time: 09/06/19  4:23 PM  Result Value Ref Range   Phosphorus 2.5 2.5 - 4.6 mg/dL  Glucose, capillary     Status: Abnormal   Collection Time: 09/06/19  5:04 PM  Result Value Ref Range   Glucose-Capillary 119 (H) 70 - 99  mg/dL  Glucose, capillary     Status: Abnormal   Collection Time: 09/06/19  7:34 PM  Result Value Ref Range   Glucose-Capillary 155 (H) 70 - 99 mg/dL  Glucose, capillary     Status: Abnormal   Collection Time: 09/06/19 11:15 PM  Result Value Ref Range   Glucose-Capillary 148 (H) 70 - 99 mg/dL  Glucose, capillary     Status: Abnormal   Collection Time: 09/07/19  3:25 AM  Result Value Ref Range   Glucose-Capillary 156 (H) 70 - 99 mg/dL  Triglycerides     Status: None   Collection Time: 09/07/19  6:31 AM  Result Value Ref Range   Triglycerides 100 <150 mg/dL  Magnesium     Status: None   Collection Time: 09/07/19  6:31 AM  Result Value Ref Range   Magnesium 2.1 1.7 - 2.4 mg/dL  Phosphorus     Status: Abnormal   Collection Time: 09/07/19  6:31 AM  Result Value Ref Range   Phosphorus 1.2 (L) 2.5 - 4.6 mg/dL  CBC     Status: Abnormal   Collection Time: 09/07/19  6:31 AM  Result Value Ref Range   WBC 20.2 (H) 4.0 - 10.5 K/uL   RBC 3.00 (L) 4.22 - 5.81 MIL/uL   Hemoglobin 9.6 (L) 13.0 - 17.0 g/dL   HCT 28.4 (L) 39 - 52 %  MCV 94.7 80.0 - 100.0 fL   MCH 32.0 26.0 - 34.0 pg   MCHC 33.8 30.0 - 36.0 g/dL   RDW 14.7 11.5 - 15.5 %   Platelets 232 150 - 400 K/uL   nRBC 0.0 0.0 - 0.2 %  Basic metabolic panel     Status: Abnormal   Collection Time: 09/07/19  6:31 AM  Result Value Ref Range   Sodium 138 135 - 145 mmol/L   Potassium 3.8 3.5 - 5.1 mmol/L   Chloride 106 98 - 111 mmol/L   CO2 24 22 - 32 mmol/L   Glucose, Bld 152 (H) 70 - 99 mg/dL   BUN 11 6 - 20 mg/dL   Creatinine, Ser 0.68 0.61 - 1.24 mg/dL   Calcium 8.0 (L) 8.9 - 10.3 mg/dL   GFR calc non Af Amer >60 >60 mL/min   GFR calc Af Amer >60 >60 mL/min   Anion gap 8 5 - 15  Glucose, capillary     Status: Abnormal   Collection Time: 09/07/19  8:09 AM  Result Value Ref Range   Glucose-Capillary 156 (H) 70 - 99 mg/dL    Assessment & Plan: The plan of care was discussed with the bedside nurse for the day, Roselyn Reef, who is in  agreement with this plan and no additional concerns were raised.   Present on Admission: . SDH (subdural hematoma) (HCC)    LOS: 2 days   Additional comments:I reviewed the patient's new clinical lab test results.   and I reviewed the patients new imaging test results.    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 Arnoldo Morale 6/30 Occipital and clival midline fxs extending into R sphenoid with hemosinus and pneumocephalus - maintain c-spine VDRF - will try to wean some more, unlikely to extubate today Atelectasis and/or aspiration  L1/2 R TP fxs - pain control Leukocytosis - monitor FEN - IVF, cont TF, replete hypophos Foley - remove VTE - SCDs, no LMWH yet - possibly 7/3 if CT H stable. Will D/W Dr. Arnoldo Morale Plan - ICU  Clinical update provided to the brother at bedside this AM using the interpreter device.   Critical Care Total Time: 50 minutes  Jesusita Oka, MD Trauma & General Surgery Please use [email or web address] to contact on call provider  09/07/2019  *Care during the described time interval was provided by me. I have reviewed this patient's available data, including medical history, events of note, physical examination and test results as part of my evaluation.

## 2019-09-07 NOTE — Progress Notes (Signed)
No charge note.    Epic chart reviewed.  Spoke with Palliative Chaplain and Trauma attending MD.   At Dr. Craig Guess recommendation will DC order for Palliative consult this morning.  Please do not hesitate to re-consult Palliative if we can be of assistance.  Florentina Jenny, PA-C Palliative Medicine Office:  [phone]

## 2019-09-07 NOTE — TOC CAGE-AID Note (Signed)
Transition of Care [facility]) - CAGE-AID Screening   Patient Details  Name: Jason Skinner MRN: [id number] Date of Birth: 1990/11/23  Transition of Care [facility]) CM/SW Contact:    Emeterio Reeve, [place] Phone Number: 09/07/2019, 9:56 AM   Clinical Narrative:  Pt was unable to participate in assessment due to being on ventilator.   CAGE-AID Screening: Substance Abuse Screening unable to be completed due to: : Patient unable to participate      Providence Crosby Clinical Social Worker [phone]

## 2019-09-07 NOTE — Progress Notes (Signed)
Providing Compassionate, Quality Care - Together   Subjective: Patient is intubated. Sedation recently turned off for better neurologic assessment. His brother is at the bedside.  Objective: Vital signs in last 24 hours: Temp:  [98.2 F (36.8 C)-100.6 F (38.1 C)] 99.3 F (37.4 C) (07/02 1000) Pulse Rate:  [63-88] 72 (07/02 1000) Resp:  [14-18] 14 (07/02 1000) BP: (109-136)/(58-82) 136/67 (07/02 1000) SpO2:  [97 %-100 %] 98 % (07/02 1000) Arterial Line BP: (137-158)/(57-72) 158/72 (07/02 0500) FiO2 (%):  [40 %] 40 % (07/02 0816) Weight:  [75.7 kg] 75.7 kg (07/02 0500)  Intake/Output from previous day: 07/01 0701 - 07/02 0700 In: 2858.1 [I.V.:1592.7; NG/GT:1065.7; IV Piggyback:199.7] Out: 1467.5 [Urine:1400; Drains:67.5] Intake/Output this shift: Total I/O In: 382.2 [I.V.:266.3; IV Piggyback:115.9] Out: 300 [Urine:300]  Intubated and sedated Pupils round, reactive, Left pupil 3 mm, Right pupil 4 mm MAE, Intermittently following commands Right frontotemporoparietal crani incision closed with staples; surgical wound is clean, dry, and intact Significant right-sided head and periorbital edema Right abdominal incision with Steri Strips and Honeycomb dressing, clean, dry, and intact 2 subdural drains with combined output of 23 mL over the last 24 hours   Lab Results: Recent Labs    09/06/19 0500 09/07/19 0631  WBC 17.1* 20.2*  HGB 11.0* 9.6*  HCT 32.3* 28.4*  PLT 232 232   BMET Recent Labs    09/06/19 0500 09/07/19 0631  NA 138 138  K 3.6 3.8  CL 108 106  CO2 23 24  GLUCOSE 135* 152*  BUN 6 11  CREATININE 0.71 0.68  CALCIUM 7.7* 8.0*    Studies/Results: DG ELBOW COMPLETE RIGHT (3+VIEW)  Result Date: 09/05/2019 CLINICAL DATA:  Pain status post fall EXAM: RIGHT ELBOW - COMPLETE 3+ VIEW COMPARISON:  None. FINDINGS: There is no evidence of fracture, dislocation, or joint effusion. There is no evidence of arthropathy or other focal bone abnormality. Soft  tissues are unremarkable. IMPRESSION: Negative. Electronically Signed   By: Constance Holster M.D.   On: 09/05/2019 14:53   DG Abd 1 View  Result Date: 09/05/2019 CLINICAL DATA:  Pain. EXAM: ABDOMEN - 1 VIEW COMPARISON:  CT from the same day FINDINGS: The enteric tube projects over the stomach. The visualized bowel gas pattern is unremarkable. IMPRESSION: Enteric tube projects over the stomach. Electronically Signed   By: Constance Holster M.D.   On: 09/05/2019 14:54   CT HEAD WO CONTRAST  Result Date: 09/06/2019 CLINICAL DATA:  Subdural hematoma. EXAM: CT HEAD WITHOUT CONTRAST TECHNIQUE: Contiguous axial images were obtained from the base of the skull through the vertex without intravenous contrast. COMPARISON:  Head CT 09/05/2019 FINDINGS: Brain: There postoperative changes from interval right-sided craniotomy for right subdural hematoma evacuation. There is decreased mass effect, now measuring 5 mm (previously 20 mm). Persistent although decreased leftward subfalcine herniation. There is persistent hyperdense extra-axial hemorrhage overlying the right cerebral convexity at the craniotomy site which focally measures up to 2.3 cm in thickness along the course of a right subdural drain (series 5, image 39). There is also a small amount of extra-axial pneumocephalus adjacent to the craniotomy site. Persistent small-bowel subdural hemorrhage along the falx. There has been interval blooming of hemorrhagic contusion within the right greater than left anterior frontal lobes and anterior left temporal lobe, again with overlying acute subarachnoid hemorrhage. A component of the extra-axial hemorrhage overlying the frontal pole may be subdural. There has been interval placement of a right frontal approach ventricular drain which terminates within the anterior left lateral  ventricle. Trace hemorrhage within the atrium of the left lateral ventricle. No evidence of hydrocephalus. There is a small focus of  pneumocephalus within the left lateral ventricle frontal horn. Persistent although decreased effacement of the right lateral ventral Vascular: No hyperdense vessel. Skull: Right-sided craniotomy. Redemonstrated midline fracture involving the occipital calvarium and clivus with extension into the right sphenoid sinus. Sinuses/Orbits: Visualized orbits show no acute finding. Near complete opacification of the sphenoid sinuses. Air-fluid level within the right maxillary sinus. No significant mastoid effusion. Partially visualized support tubes. Other: Scalp edema overlying the right-sided craniotomy site with associated scalp staples. IMPRESSION: IMPRESSION 1. Postoperative changes from interval right-sided craniotomy for right subdural hematoma evacuation. 2. Decreased mass effect with now 5 mm leftward midline shift (previously 20 mm). Persistent although decreased leftward subfalcine herniation. 3. There is some persistent extra-axial hemorrhage overlying the right cerebral convexity at the craniotomy site which focally measures up to 2.3 cm in thickness along the course of a subdural drain. Additionally, there is persistent small volume subdural hemorrhage along the falx. 4. Interval blooming of hemorrhagic contusion involving the right greater than left anterior frontal lobes and left temporal lobe with redemonstrated overlying acute subarachnoid hemorrhage. 5. Interval placement of a right frontal approach ventricular drain terminating within the anterior left lateral ventricle. No evidence of hydrocephalus. Trace hemorrhage within the atrium of the left lateral ventricle. Electronically Signed   By: Kellie Simmering DO   On: 09/06/2019 07:39   CT HEAD WO CONTRAST  Result Date: 09/05/2019 CLINICAL DATA:  Level 1 trauma EXAM: CT HEAD WITHOUT CONTRAST CT MAXILLOFACIAL WITHOUT CONTRAST CT CERVICAL SPINE WITHOUT CONTRAST TECHNIQUE: Multidetector CT imaging of the head, cervical spine, and maxillofacial structures  were performed using the standard protocol without intravenous contrast. Multiplanar CT image reconstructions of the cervical spine and maxillofacial structures were also generated. COMPARISON:  None. FINDINGS: CT HEAD FINDINGS Brain: Mixed density subdural hematoma along the right cerebral convexity and left frontal pole. Mixed density could be from a continued and un clotted bleeding, especially on the right. The right-sided hematoma measures up to 15 mm in thickness. Midline shift measures up to 2 cm. Pneumocephalus with intraventricular extension. Right uncal herniation. Mix of contusion and subarachnoid hemorrhage over the left temporal pole which is edematous. No visible infarct. Vascular: Negative Skull: Midline skull fracture involving the occiput and clivus. There is extension into the right sphenoid sinus, which accounts for pneumocephalus and sinus opacification. Posterior scalp contusion CT MAXILLOFACIAL FINDINGS Osseous: Intact and located mandible. Midline clivus fracture extending to the right sphenoid sinus. Orbits: No evidence of injury. Sinuses: Right sphenoid and maxillary hemosinus Soft tissues: Negative CT CERVICAL SPINE FINDINGS Alignment: Normal Skull base and vertebrae: Clival fracture. No cervical spine fracture. Soft tissues and spinal canal: Gas present around the dens. The tectorial membrane is continuous and visible. No visible canal hematoma Disc levels:  No degenerative changes or visible impingement. Upper chest: Reported separately Critical Value/emergent results were discussed in person on 09/05/2019 at 11:35 am with Dr Bobbye Morton. IMPRESSION: Head CT: 1. Right more than left cerebral convexity subdural hematoma with leftward midline shift of 2 cm and right uncal herniation. 2. Left temporal pole contusion with overlying subarachnoid hemorrhage. 3. Occipital and clival midline fractures extending into the right sphenoid sinus with pneumocephalus. Cervical spine CT: Gas around the dens  presumably related to the skull base fracture. Craniocervical alignment is normal and the tectorial membrane is visible. Alar or other ligamentous injury is still conceivable. Maxillofacial CT: Clival  fracture extending into the right sphenoid sinus with hemosinus and pneumocephalus. Electronically Signed   By: Monte Fantasia M.D.   On: 09/05/2019 11:40   CT CHEST W CONTRAST  Result Date: 09/05/2019 CLINICAL DATA:  Moderate to severe chest trauma EXAM: CT CHEST, ABDOMEN, AND PELVIS WITH CONTRAST TECHNIQUE: Multidetector CT imaging of the chest, abdomen and pelvis was performed following the standard protocol during bolus administration of intravenous contrast. CONTRAST:  Dose is currently not available COMPARISON:  None. FINDINGS: CT CHEST FINDINGS Cardiovascular: Normal heart size. No pericardial effusion. No evidence of great vessel injury. Mediastinum/Nodes: No hematoma or pneumomediastinum. There is a coarse calcification in the right lobe of the thyroid which is asymmetrically larger with a 2 cm nodular projection extending inferiorly that is isodense to the remaining gland. Lungs/Pleura: No hemothorax, pneumothorax, or lung contusion. Dependent opacity with volume loss is likely atelectasis and aspiration. Musculoskeletal: No acute finding CT ABDOMEN PELVIS FINDINGS Hepatobiliary: No hepatic injury or perihepatic hematoma. Gallbladder is unremarkable Pancreas: Negative Spleen: Left upper quadrant poly splenia or localize splenosis. No acute injury. Adrenals/Urinary Tract: No adrenal hemorrhage or renal injury identified. Bladder is unremarkable. Stomach/Bowel: No evidence of injury Vascular/Lymphatic: No acute vascular finding Reproductive: Negative Other: No ascites or pneumoperitoneum. Musculoskeletal: L1 and L2 right transverse process fractures. Vacuum phenomenon around the sacroiliac joints without diastasis IMPRESSION: 1. L1 and L2 right transverse process fractures. 2. Bilateral dependent  atelectasis and or aspiration. 3. 2 cm nodular projection from the right thyroid lobe. After convalescence, recommend thyroid US(Ref: J Am Coll Radiol. 2015 Feb;12(2): 143-50). Electronically Signed   By: Monte Fantasia M.D.   On: 09/05/2019 11:45   CT CERVICAL SPINE WO CONTRAST  Result Date: 09/05/2019 CLINICAL DATA:  Level 1 trauma EXAM: CT HEAD WITHOUT CONTRAST CT MAXILLOFACIAL WITHOUT CONTRAST CT CERVICAL SPINE WITHOUT CONTRAST TECHNIQUE: Multidetector CT imaging of the head, cervical spine, and maxillofacial structures were performed using the standard protocol without intravenous contrast. Multiplanar CT image reconstructions of the cervical spine and maxillofacial structures were also generated. COMPARISON:  None. FINDINGS: CT HEAD FINDINGS Brain: Mixed density subdural hematoma along the right cerebral convexity and left frontal pole. Mixed density could be from a continued and un clotted bleeding, especially on the right. The right-sided hematoma measures up to 15 mm in thickness. Midline shift measures up to 2 cm. Pneumocephalus with intraventricular extension. Right uncal herniation. Mix of contusion and subarachnoid hemorrhage over the left temporal pole which is edematous. No visible infarct. Vascular: Negative Skull: Midline skull fracture involving the occiput and clivus. There is extension into the right sphenoid sinus, which accounts for pneumocephalus and sinus opacification. Posterior scalp contusion CT MAXILLOFACIAL FINDINGS Osseous: Intact and located mandible. Midline clivus fracture extending to the right sphenoid sinus. Orbits: No evidence of injury. Sinuses: Right sphenoid and maxillary hemosinus Soft tissues: Negative CT CERVICAL SPINE FINDINGS Alignment: Normal Skull base and vertebrae: Clival fracture. No cervical spine fracture. Soft tissues and spinal canal: Gas present around the dens. The tectorial membrane is continuous and visible. No visible canal hematoma Disc levels:  No  degenerative changes or visible impingement. Upper chest: Reported separately Critical Value/emergent results were discussed in person on 09/05/2019 at 11:35 am with Dr Bobbye Morton. IMPRESSION: Head CT: 1. Right more than left cerebral convexity subdural hematoma with leftward midline shift of 2 cm and right uncal herniation. 2. Left temporal pole contusion with overlying subarachnoid hemorrhage. 3. Occipital and clival midline fractures extending into the right sphenoid sinus with pneumocephalus. Cervical spine  CT: Gas around the dens presumably related to the skull base fracture. Craniocervical alignment is normal and the tectorial membrane is visible. Alar or other ligamentous injury is still conceivable. Maxillofacial CT: Clival fracture extending into the right sphenoid sinus with hemosinus and pneumocephalus. Electronically Signed   By: Monte Fantasia M.D.   On: 09/05/2019 11:40   CT ABDOMEN PELVIS W CONTRAST  Result Date: 09/05/2019 CLINICAL DATA:  Moderate to severe chest trauma EXAM: CT CHEST, ABDOMEN, AND PELVIS WITH CONTRAST TECHNIQUE: Multidetector CT imaging of the chest, abdomen and pelvis was performed following the standard protocol during bolus administration of intravenous contrast. CONTRAST:  Dose is currently not available COMPARISON:  None. FINDINGS: CT CHEST FINDINGS Cardiovascular: Normal heart size. No pericardial effusion. No evidence of great vessel injury. Mediastinum/Nodes: No hematoma or pneumomediastinum. There is a coarse calcification in the right lobe of the thyroid which is asymmetrically larger with a 2 cm nodular projection extending inferiorly that is isodense to the remaining gland. Lungs/Pleura: No hemothorax, pneumothorax, or lung contusion. Dependent opacity with volume loss is likely atelectasis and aspiration. Musculoskeletal: No acute finding CT ABDOMEN PELVIS FINDINGS Hepatobiliary: No hepatic injury or perihepatic hematoma. Gallbladder is unremarkable Pancreas: Negative  Spleen: Left upper quadrant poly splenia or localize splenosis. No acute injury. Adrenals/Urinary Tract: No adrenal hemorrhage or renal injury identified. Bladder is unremarkable. Stomach/Bowel: No evidence of injury Vascular/Lymphatic: No acute vascular finding Reproductive: Negative Other: No ascites or pneumoperitoneum. Musculoskeletal: L1 and L2 right transverse process fractures. Vacuum phenomenon around the sacroiliac joints without diastasis IMPRESSION: 1. L1 and L2 right transverse process fractures. 2. Bilateral dependent atelectasis and or aspiration. 3. 2 cm nodular projection from the right thyroid lobe. After convalescence, recommend thyroid US(Ref: J Am Coll Radiol. 2015 Feb;12(2): 143-50). Electronically Signed   By: Monte Fantasia M.D.   On: 09/05/2019 11:45   DG Chest Port 1 View  Result Date: 09/06/2019 CLINICAL DATA:  Fall. EXAM: PORTABLE CHEST 1 VIEW COMPARISON:  Chest CT and chest x-ray 09/05/2019. FINDINGS: Endotracheal tube noted stable position. NG tube noted with tip coiled in stomach. Mediastinum hilar structures normal. Heart size normal. Lungs scratched it mild right mid lung subsegmental atelectasis. No focal alveolar infiltrate. No pleural effusion or pneumothorax. No acute bony abnormality identified IMPRESSION: 1. Endotracheal tube in stable position. NG tube noted with tip coiled in stomach. 2. Mild right base subsegmental atelectasis. No acute cardiopulmonary disease otherwise noted. Electronically Signed   By: Marcello Moores  Register   On: 09/06/2019 06:48   CT MAXILLOFACIAL WO CONTRAST  Result Date: 09/05/2019 CLINICAL DATA:  Level 1 trauma EXAM: CT HEAD WITHOUT CONTRAST CT MAXILLOFACIAL WITHOUT CONTRAST CT CERVICAL SPINE WITHOUT CONTRAST TECHNIQUE: Multidetector CT imaging of the head, cervical spine, and maxillofacial structures were performed using the standard protocol without intravenous contrast. Multiplanar CT image reconstructions of the cervical spine and maxillofacial  structures were also generated. COMPARISON:  None. FINDINGS: CT HEAD FINDINGS Brain: Mixed density subdural hematoma along the right cerebral convexity and left frontal pole. Mixed density could be from a continued and un clotted bleeding, especially on the right. The right-sided hematoma measures up to 15 mm in thickness. Midline shift measures up to 2 cm. Pneumocephalus with intraventricular extension. Right uncal herniation. Mix of contusion and subarachnoid hemorrhage over the left temporal pole which is edematous. No visible infarct. Vascular: Negative Skull: Midline skull fracture involving the occiput and clivus. There is extension into the right sphenoid sinus, which accounts for pneumocephalus and sinus opacification. Posterior scalp  contusion CT MAXILLOFACIAL FINDINGS Osseous: Intact and located mandible. Midline clivus fracture extending to the right sphenoid sinus. Orbits: No evidence of injury. Sinuses: Right sphenoid and maxillary hemosinus Soft tissues: Negative CT CERVICAL SPINE FINDINGS Alignment: Normal Skull base and vertebrae: Clival fracture. No cervical spine fracture. Soft tissues and spinal canal: Gas present around the dens. The tectorial membrane is continuous and visible. No visible canal hematoma Disc levels:  No degenerative changes or visible impingement. Upper chest: Reported separately Critical Value/emergent results were discussed in person on 09/05/2019 at 11:35 am with Dr Bobbye Morton. IMPRESSION: Head CT: 1. Right more than left cerebral convexity subdural hematoma with leftward midline shift of 2 cm and right uncal herniation. 2. Left temporal pole contusion with overlying subarachnoid hemorrhage. 3. Occipital and clival midline fractures extending into the right sphenoid sinus with pneumocephalus. Cervical spine CT: Gas around the dens presumably related to the skull base fracture. Craniocervical alignment is normal and the tectorial membrane is visible. Alar or other ligamentous  injury is still conceivable. Maxillofacial CT: Clival fracture extending into the right sphenoid sinus with hemosinus and pneumocephalus. Electronically Signed   By: Monte Fantasia M.D.   On: 09/05/2019 11:40    Assessment/Plan: Patient is 2 days status post craniectomy for subdural evacuation.   LOS: 2 days    -Will remove the 2 subdural JP drains today -Continue supportive care per [place] to start [place] on 09/09/2019   Viona Gilmore, [place], AGNP-C Nurse Practitioner  [facility] [place]. [address], [place], [place] [address] P: [phone]    F: [phone]  09/07/2019, 12:03 PM

## 2019-09-08 LAB — CBC
HCT: 26.1 % — ABNORMAL LOW (ref 39.0–52.0)
Hemoglobin: 8.7 g/dL — ABNORMAL LOW (ref 13.0–17.0)
MCH: 31.6 pg (ref 26.0–34.0)
MCHC: 33.3 g/dL (ref 30.0–36.0)
MCV: 94.9 fL (ref 80.0–100.0)
Platelets: 237 10*3/uL (ref 150–400)
RBC: 2.75 MIL/uL — ABNORMAL LOW (ref 4.22–5.81)
RDW: 14.8 % (ref 11.5–15.5)
WBC: 16.9 10*3/uL — ABNORMAL HIGH (ref 4.0–10.5)
nRBC: 0 % (ref 0.0–0.2)

## 2019-09-08 LAB — GLUCOSE, CAPILLARY
Glucose-Capillary: 114 mg/dL — ABNORMAL HIGH (ref 70–99)
Glucose-Capillary: 114 mg/dL — ABNORMAL HIGH (ref 70–99)
Glucose-Capillary: 118 mg/dL — ABNORMAL HIGH (ref 70–99)
Glucose-Capillary: 122 mg/dL — ABNORMAL HIGH (ref 70–99)
Glucose-Capillary: 124 mg/dL — ABNORMAL HIGH (ref 70–99)
Glucose-Capillary: 133 mg/dL — ABNORMAL HIGH (ref 70–99)
Glucose-Capillary: 144 mg/dL — ABNORMAL HIGH (ref 70–99)

## 2019-09-08 LAB — BASIC METABOLIC PANEL
Anion gap: 10 (ref 5–15)
BUN: 11 mg/dL (ref 6–20)
CO2: 22 mmol/L (ref 22–32)
Calcium: 8 mg/dL — ABNORMAL LOW (ref 8.9–10.3)
Chloride: 107 mmol/L (ref 98–111)
Creatinine, Ser: 0.64 mg/dL (ref 0.61–1.24)
GFR calc Af Amer: >60 mL/min (ref >60)
GFR calc non Af Amer: >60 mL/min (ref >60)
Glucose, Bld: 151 mg/dL — ABNORMAL HIGH (ref 70–99)
Potassium: 3.4 mmol/L — ABNORMAL LOW (ref 3.5–5.1)
Sodium: 139 mmol/L (ref 135–145)

## 2019-09-08 LAB — PHOSPHORUS: Phosphorus: 1.9 mg/dL — ABNORMAL LOW (ref 2.5–4.6)

## 2019-09-08 LAB — MAGNESIUM: Magnesium: 2.2 mg/dL (ref 1.7–2.4)

## 2019-09-08 MED ORDER — CHLORHEXIDINE GLUCONATE CLOTH 2 % EX PADS
6.0000 | MEDICATED_PAD | Freq: Every day | CUTANEOUS | Status: DC
  Administered 2019-09-08: 6 via TOPICAL

## 2019-09-08 MED ORDER — POTASSIUM PHOSPHATES 15 MMOLE/5ML IV SOLN
20.0000 mmol | Freq: Once | INTRAVENOUS | Status: AC
  Administered 2019-09-08: 20 mmol via INTRAVENOUS
  Filled 2019-09-08: qty 6.67

## 2019-09-08 NOTE — Progress Notes (Signed)
Neurosurgery Service Progress Note  Subjective: No acute events overnight, doing wean trial this morning   Objective: Vitals:   09/08/19 1100 09/08/19 1200 09/08/19 1300 09/08/19 1401  BP: 119/61 (!) 117/55 (!) 115/59   Pulse: 72 68 (!) 58 62  Resp: '18 18 18 18  ' Temp: 100.2 F (37.9 C) 99.1 F (37.3 C) 98.4 F (36.9 C)   TempSrc:      SpO2: 100% 100% 100% 100%  Weight:      Height:       Temp (24hrs), Avg:99 F (37.2 C), Min:97.7 F (36.5 C), Max:100.2 F (37.9 C)  CBC Latest Ref Rng & Units 09/08/2019 09/07/2019 09/06/2019  WBC 4.0 - 10.5 K/uL 16.9(H) 20.2(H) 17.1(H)  Hemoglobin 13.0 - 17.0 g/dL 8.7(L) 9.6(L) 11.0(L)  Hematocrit 39 - 52 % 26.1(L) 28.4(L) 32.3(L)  Platelets 150 - 400 K/uL 237 232 232   BMP Latest Ref Rng & Units 09/08/2019 09/07/2019 09/06/2019  Glucose 70 - 99 mg/dL 151(H) 152(H) 135(H)  BUN 6 - 20 mg/dL '11 11 6  ' Creatinine 0.61 - 1.24 mg/dL 0.64 0.68 0.71  Sodium 135 - 145 mmol/L 139 138 138  Potassium 3.5 - 5.1 mmol/L 3.4(L) 3.8 3.6  Chloride 98 - 111 mmol/L 107 106 108  CO2 22 - 32 mmol/L '22 24 23  ' Calcium 8.9 - 10.3 mg/dL 8.0(L) 8.0(L) 7.7(L)    Intake/Output Summary (Last 24 hours) at 09/08/2019 1431 Last data filed at 09/08/2019 1300 Gross per 24 hour  Intake 3118.21 ml  Output 1520 ml  Net 1598.21 ml    Current Facility-Administered Medications:  .  0.9 %  sodium chloride infusion (Manually program via Guardrails IV Fluids), , Intravenous, Once, Milford Cage, CRNA, Held at 09/05/19 9826 .  0.9 %  sodium chloride infusion, , Intravenous, Continuous, Georganna Skeans, MD, Last Rate: 50 mL/hr at 09/08/19 1300, Rate Verify at 09/08/19 1300 .  acetaminophen (TYLENOL) suppository 650 mg, 650 mg, Rectal, Q4H PRN, Saverio Danker, PA-C .  acetaminophen (TYLENOL) tablet 1,000 mg, 1,000 mg, Per Tube, Q6H, Jesusita Oka, MD, 1,000 mg at 09/08/19 1320 .  ascorbic acid (VITAMIN C) tablet 1,000 mg, 1,000 mg, Per Tube, Q8H, Georganna Skeans, MD, 1,000 mg at 09/08/19  1321 .  chlorhexidine gluconate (MEDLINE KIT) (PERIDEX) 0.12 % solution 15 mL, 15 mL, Mouth Rinse, BID, Jesusita Oka, MD, 15 mL at 09/08/19 0858 .  Chlorhexidine Gluconate Cloth 2 % PADS 6 each, 6 each, Topical, Daily, Kinsinger, Arta Bruce, MD .  feeding supplement (PIVOT 1.5 CAL) liquid 1,000 mL, 1,000 mL, Per Tube, Continuous, Georganna Skeans, MD, Last Rate: 60 mL/hr at 09/08/19 1315, Restarted at 09/08/19 1315 .  fentaNYL 2579mg in NS 2553m(1076mml) infusion-PREMIX, 0-400 mcg/hr, Intravenous, Continuous, OsbSaverio DankerA-C, Stopped at 09/08/19 1149 .  levETIRAcetam (KEPPRA) IVPB 500 mg/100 mL premix, 500 mg, Intravenous, Q12H, Meuth, Brooke A, PA-C, Stopped at 09/08/19 [phone] MEDLINE mouth rinse, 15 mL, Mouth Rinse, 10 times per day, LovJesusita OkaD, 15 mL at 09/08/19 1328 .  methocarbamol (ROBAXIN) 500 mg in dextrose 5 % 50 mL IVPB, 500 mg, Intravenous, Q8H, Lovick, AyeMontel CulverD, Last Rate: 100 mL/hr at 09/08/19 1324, 500 mg at 09/08/19 1324 .  metoprolol tartrate (LOPRESSOR) injection 5 mg, 5 mg, Intravenous, Q6H PRN, OsbSaverio DankerA-C .  ondansetron (ZOFRAN-ODT) disintegrating tablet 4 mg, 4 mg, Oral, Q6H PRN **OR** ondansetron (ZOFRAN) injection 4 mg, 4 mg, Intravenous, Q6H PRN, OsbSaverio DankerA-C .  pantoprazole (PROTONIX)  EC tablet 40 mg, 40 mg, Oral, Daily **OR** pantoprazole (PROTONIX) injection 40 mg, 40 mg, Intravenous, Daily, Saverio Danker, PA-C, 40 mg at 09/08/19 0908 .  potassium PHOSPHATE 20 mmol in dextrose 5 % 500 mL infusion, 20 mmol, Intravenous, Once, Kinsinger, Arta Bruce, MD, Last Rate: 83 mL/hr at 09/08/19 1300, Rate Verify at 09/08/19 1300 .  selenium tablet 200 mcg, 200 mcg, Per Tube, Daily, Georganna Skeans, MD, 200 mcg at 09/08/19 0908   Physical Exam: Intubated, FC well in spanish x4, R eye swollen, left eye open to voice  Assessment & Plan: 29 y.o. man s/p R hemicraniectomy for SDH, FC, doing well  -no change in neurosurgical plan of  care  Judith Part  09/08/19 2:31 PM

## 2019-09-08 NOTE — Progress Notes (Signed)
RT has attempted several times to place pt on CPAP/PS for wean.  Pt goes apneic even though he gets agitated at times.  Family at bedside and with stimulation pt still shows no resp. Effort.  RT will continue to monitor and try throughout shift.

## 2019-09-08 NOTE — Progress Notes (Signed)
Follow up - Trauma and Critical Care  Patient Details:    Jason Skinner is an 29 y.o. male.  Lines/tubes : Airway 7.5 mm (Active)  Secured at (cm) 23 cm 09/08/19 0811  Measured From Lips 09/08/19 0811  Secured Location Left 09/08/19 0811  Secured By [employer] 09/08/19 0811  Tube Holder Repositioned Yes 09/08/19 0351  Cuff Pressure (cm H2O) 30 cm H2O 09/07/19 1945  Site Condition Dry 09/08/19 0811     NG/OG Tube Orogastric 16 Fr. Center mouth Xray Measured external length of tube (Active)  External Length of Tube (cm) - (if applicable) 50 cm 95/28/41 0900  Site Assessment Clean;Dry;Intact 09/07/19 1940  Ongoing Placement Verification No change in respiratory status;No acute changes, not attributed to clinical condition 09/07/19 1940  Status Infusing tube feed 09/07/19 1940  Amount of suction 90 mmHg 09/05/19 2000  Drainage Appearance Coffee ground 09/06/19 0800  Output (mL) 0 mL 09/06/19 0800     Urethral Catheter Iona Beard L. Natale RN Straight-tip 16 Fr. (Active)  Indication for Insertion or Continuance of Catheter Acute urinary retention (I&O Cath for 24 hrs prior to catheter insertion- Inpatient Only) 09/08/19 0800  Site Assessment Clean;Intact;Dry 09/08/19 0800  Catheter Maintenance Bag below level of bladder;Catheter secured;Insertion date on drainage bag;Drainage bag/tubing not touching floor;Seal intact;Bag emptied prior to transport;No dependent loops 09/08/19 0800  Collection Container Standard drainage bag 09/08/19 0800  Securement Method Securing device (Describe) 09/08/19 0800  Urinary Catheter Interventions (if applicable) Unclamped 32/44/01 0800  Output (mL) 100 mL 09/08/19 0900    Microbiology/Sepsis markers: Results for orders placed or performed during the hospital encounter of 09/05/19  SARS Coronavirus 2 by RT PCR (hospital order, performed in [facility] hospital lab) Nasopharyngeal Nasopharyngeal Swab     Status: None   Collection Time: 09/05/19  11:50 AM   Specimen: Nasopharyngeal Swab  Result Value Ref Range Status   SARS Coronavirus 2 NEGATIVE NEGATIVE Final    Comment: (NOTE) SARS-CoV-2 target nucleic acids are NOT DETECTED.  The SARS-CoV-2 RNA is generally detectable in upper and lower respiratory specimens during the acute phase of infection. The lowest concentration of SARS-CoV-2 viral copies this assay can detect is 250 copies / mL. A negative result does not preclude SARS-CoV-2 infection and should not be used as the sole basis for treatment or other patient management decisions.  A negative result may occur with improper specimen collection / handling, submission of specimen other than nasopharyngeal swab, presence of viral mutation(s) within the areas targeted by this assay, and inadequate number of viral copies (<250 copies / mL). A negative result must be combined with clinical observations, patient history, and epidemiological information.  Fact Sheet for Patients:   StrictlyIdeas.no  Fact Sheet for Healthcare Providers: BankingDealers.co.za  This test is not yet approved or  cleared by the [place] FDA and has been authorized for detection and/or diagnosis of SARS-CoV-2 by FDA under an Emergency Use Authorization (EUA).  This EUA will remain in effect (meaning this test can be used) for the duration of the COVID-19 declaration under Section 564(b)(1) of the Act, 21 U.S.C. section 360bbb-3(b)(1), unless the authorization is terminated or revoked sooner.  Performed at [facility], [place] [address] [place], [place] [address]   MRSA PCR Screening     Status: None   Collection Time: 09/05/19  4:49 PM   Specimen: Urine, Catheterized; Nasopharyngeal  Result Value Ref Range Status   MRSA by PCR NEGATIVE NEGATIVE Final    Comment:  The GeneXpert MRSA Assay (FDA approved for NASAL specimens only), is one component of a comprehensive MRSA  colonization surveillance program. It is not intended to diagnose MRSA infection nor to guide or monitor treatment for MRSA infections. Performed at [facility], [place] [address], [place], [place] [address]     Anti-infectives:  Anti-infectives (From admission, onward)   Start     Dose/Rate Route Frequency Ordered Stop   09/05/19 1250  bacitracin 50,000 Units in sodium chloride 0.9 % 500 mL irrigation  Status:  Discontinued          As needed 09/05/19 1250 09/05/19 1414      Consults: Treatment Team:  Newman Pies, MD   Chief Complaint/Subjective:    Overnight Issues: Weaned earlier this morning but became apneic  Objective:  Vital signs for last 24 hours: Temp:  [97.7 F (36.5 C)-100.2 F (37.9 C)] 100.2 F (37.9 C) (07/03 0900) Pulse Rate:  [64-80] 71 (07/03 0900) Resp:  [13-18] 18 (07/03 0900) BP: (101-129)/(52-74) 123/70 (07/03 0900) SpO2:  [96 %-100 %] 100 % (07/03 0900) FiO2 (%):  [40 %] 40 % (07/03 0811) Weight:  [78.6 kg] 78.6 kg (07/03 0440)  Hemodynamic parameters for last 24 hours:    Intake/Output from previous day: 07/02 0701 - 07/03 0700 In: 2375.6 [I.V.:1318.2; NG/GT:600; IV Piggyback:457.4] Out: 1590 [Urine:1570; Drains:20]  Intake/Output this shift: Total I/O In: 1142.2 [I.V.:122.2; NG/GT:1020] Out: 100 [Urine:100]  Vent settings for last 24 hours: Vent Mode: PRVC FiO2 (%):  [40 %] 40 % Set Rate:  [18 bmp] 18 bmp Vt Set:  [580 mL] 580 mL PEEP:  [5 cmH20] 5 cmH20 Pressure Support:  [5 cmH20] 5 cmH20 Plateau Pressure:  [19 cmH20] 19 cmH20  Physical Exam:  Gen: intubated, easily arousable HEENT: ETT in place, staples over right side of head Resp: ventilated Cardiovascular: RRR Abdomen: soft, NT, ND, right sided incision c/d/i Ext: no edema, moves all extremities Neuro: GCS 11t, moves all extremities, able to lift head off bed  Results for orders placed or performed during the hospital encounter of 09/05/19 (from the past 24  hour(s))  Glucose, capillary     Status: Abnormal   Collection Time: 09/07/19 11:36 AM  Result Value Ref Range   Glucose-Capillary 134 (H) 70 - 99 mg/dL  Phosphorus     Status: None   Collection Time: 09/07/19  3:06 PM  Result Value Ref Range   Phosphorus 2.7 2.5 - 4.6 mg/dL  Glucose, capillary     Status: Abnormal   Collection Time: 09/07/19  3:18 PM  Result Value Ref Range   Glucose-Capillary 128 (H) 70 - 99 mg/dL  Glucose, capillary     Status: Abnormal   Collection Time: 09/07/19  7:27 PM  Result Value Ref Range   Glucose-Capillary 129 (H) 70 - 99 mg/dL  Glucose, capillary     Status: Abnormal   Collection Time: 09/07/19 11:20 PM  Result Value Ref Range   Glucose-Capillary 125 (H) 70 - 99 mg/dL  Glucose, capillary     Status: Abnormal   Collection Time: 09/08/19  3:50 AM  Result Value Ref Range   Glucose-Capillary 133 (H) 70 - 99 mg/dL  Magnesium     Status: None   Collection Time: 09/08/19  4:31 AM  Result Value Ref Range   Magnesium 2.2 1.7 - 2.4 mg/dL  Phosphorus     Status: Abnormal   Collection Time: 09/08/19  4:31 AM  Result Value Ref Range   Phosphorus 1.9 (  L) 2.5 - 4.6 mg/dL  CBC     Status: Abnormal   Collection Time: 09/08/19  4:31 AM  Result Value Ref Range   WBC 16.9 (H) 4.0 - 10.5 K/uL   RBC 2.75 (L) 4.22 - 5.81 MIL/uL   Hemoglobin 8.7 (L) 13.0 - 17.0 g/dL   HCT 26.1 (L) 39 - 52 %   MCV 94.9 80.0 - 100.0 fL   MCH 31.6 26.0 - 34.0 pg   MCHC 33.3 30.0 - 36.0 g/dL   RDW 14.8 11.5 - 15.5 %   Platelets 237 150 - 400 K/uL   nRBC 0.0 0.0 - 0.2 %  Basic metabolic panel     Status: Abnormal   Collection Time: 09/08/19  4:31 AM  Result Value Ref Range   Sodium 139 135 - 145 mmol/L   Potassium 3.4 (L) 3.5 - 5.1 mmol/L   Chloride 107 98 - 111 mmol/L   CO2 22 22 - 32 mmol/L   Glucose, Bld 151 (H) 70 - 99 mg/dL   BUN 11 6 - 20 mg/dL   Creatinine, Ser 0.64 0.61 - 1.24 mg/dL   Calcium 8.0 (L) 8.9 - 10.3 mg/dL   GFR calc non Af Amer >60 >60 mL/min   GFR calc  Af Amer >60 >60 mL/min   Anion gap 10 5 - 15  Glucose, capillary     Status: Abnormal   Collection Time: 09/08/19  8:06 AM  Result Value Ref Range   Glucose-Capillary 114 (H) 70 - 99 mg/dL  Glucose, capillary     Status: Abnormal   Collection Time: 09/08/19  9:20 AM  Result Value Ref Range   Glucose-Capillary 114 (H) 70 - 99 mg/dL     Assessment/Plan:   Fall  B SDH R>L with midline shift and uncal herniation, L temporal contusion and SAH- s/p decompressive R craniectomy and placement L EVD by Dr. Arnoldo Morale 6/30 Occipital and clival midline fxs extending into R sphenoid with hemosinus and pneumocephalus - maintain c-spine VDRF- wean today for possible extubation Atelectasis and/or aspiration  L1/2 R TP fxs - pain control Leukocytosis - monitor FEN - IVF, cont TF, replete hypophos and hypokalemia Foley - remove VTE - SCDs, no LMWH yet - ok for 7/4 Plan -ICU   LOS: 3 days   Additional comments: I reviewed labs showing persistent hypophosphatemia and discussed clinical status and plan with patient and brother  Critical Care Total Time*: 30 Minutes  Arta Bruce Lucill Mauck 09/08/2019  *Care during the described time interval was provided by me and/or other providers on the critical care team.  I have reviewed this patient's available data, including medical history, events of note, physical examination and test results as part of my evaluation.

## 2019-09-08 NOTE — Progress Notes (Signed)
Patient has not voided since foley catheter removed 7/2. Patient was I/O cathed 7/2 @ 1800, 7/3 @ 0000 and foley catheter reinserted today @ 0600 per protocol for failure to urinate. E-link notified.

## 2019-09-09 LAB — CBC
HCT: 25.9 % — ABNORMAL LOW (ref 39.0–52.0)
Hemoglobin: 8.7 g/dL — ABNORMAL LOW (ref 13.0–17.0)
MCH: 32 pg (ref 26.0–34.0)
MCHC: 33.6 g/dL (ref 30.0–36.0)
MCV: 95.2 fL (ref 80.0–100.0)
Platelets: 280 10*3/uL (ref 150–400)
RBC: 2.72 MIL/uL — ABNORMAL LOW (ref 4.22–5.81)
RDW: 15 % (ref 11.5–15.5)
WBC: 16.5 10*3/uL — ABNORMAL HIGH (ref 4.0–10.5)
nRBC: 0.7 % — ABNORMAL HIGH (ref 0.0–0.2)

## 2019-09-09 LAB — TYPE AND SCREEN
ABO/RH(D): O POS
Antibody Screen: NEGATIVE
Unit division: 0
Unit division: 0

## 2019-09-09 LAB — BPAM RBC
Blood Product Expiration Date: 202108032359
Blood Product Expiration Date: 202108032359
Unit Type and Rh: 5100
Unit Type and Rh: 5100

## 2019-09-09 LAB — BASIC METABOLIC PANEL
Anion gap: 9 (ref 5–15)
BUN: 11 mg/dL (ref 6–20)
CO2: 24 mmol/L (ref 22–32)
Calcium: 8.1 mg/dL — ABNORMAL LOW (ref 8.9–10.3)
Chloride: 105 mmol/L (ref 98–111)
Creatinine, Ser: 0.59 mg/dL — ABNORMAL LOW (ref 0.61–1.24)
GFR calc Af Amer: >60 mL/min (ref >60)
GFR calc non Af Amer: >60 mL/min (ref >60)
Glucose, Bld: 171 mg/dL — ABNORMAL HIGH (ref 70–99)
Potassium: 3.7 mmol/L (ref 3.5–5.1)
Sodium: 138 mmol/L (ref 135–145)

## 2019-09-09 LAB — MAGNESIUM: Magnesium: 2 mg/dL (ref 1.7–2.4)

## 2019-09-09 LAB — TROPONIN I (HIGH SENSITIVITY)
Troponin I (High Sensitivity): 6 ng/L (ref <18)
Troponin I (High Sensitivity): 5 ng/L (ref <18)

## 2019-09-09 LAB — GLUCOSE, CAPILLARY
Glucose-Capillary: 160 mg/dL — ABNORMAL HIGH (ref 70–99)
Glucose-Capillary: 142 mg/dL — ABNORMAL HIGH (ref 70–99)
Glucose-Capillary: 142 mg/dL — ABNORMAL HIGH (ref 70–99)
Glucose-Capillary: 134 mg/dL — ABNORMAL HIGH (ref 70–99)
Glucose-Capillary: 145 mg/dL — ABNORMAL HIGH (ref 70–99)
Glucose-Capillary: 145 mg/dL — ABNORMAL HIGH (ref 70–99)

## 2019-09-09 LAB — HEMOGLOBIN: Hemoglobin: 8.7 g/dL — ABNORMAL LOW (ref 13.0–17.0)

## 2019-09-09 LAB — PHOSPHORUS: Phosphorus: 3.3 mg/dL (ref 2.5–4.6)

## 2019-09-09 MED ORDER — SODIUM CHLORIDE 0.9 % IV SOLN
25.0000 mg | Freq: Once | INTRAVENOUS | Status: AC
  Administered 2019-09-09: 25 mg via INTRAVENOUS
  Filled 2019-09-09: qty 0.5

## 2019-09-09 MED ORDER — OXYCODONE HCL 5 MG/5ML PO SOLN
10.0000 mg | ORAL | Status: DC | PRN
  Administered 2019-09-09 – 2019-09-10 (×2): 10 mg
  Filled 2019-09-09 (×2): qty 10

## 2019-09-09 MED ORDER — CHLORHEXIDINE GLUCONATE CLOTH 2 % EX PADS
6.0000 | MEDICATED_PAD | Freq: Every day | CUTANEOUS | Status: DC
  Administered 2019-09-09 – 2019-09-20 (×13): 6 via TOPICAL

## 2019-09-09 MED ORDER — BETHANECHOL CHLORIDE 25 MG PO TABS
25.0000 mg | ORAL_TABLET | Freq: Three times a day (TID) | ORAL | Status: DC
  Administered 2019-09-09 – 2019-09-20 (×28): 25 mg
  Filled 2019-09-09: qty 3
  Filled 2019-09-09 (×5): qty 1
  Filled 2019-09-09: qty 3
  Filled 2019-09-09 (×5): qty 1
  Filled 2019-09-09: qty 3
  Filled 2019-09-09 (×2): qty 1
  Filled 2019-09-09: qty 3
  Filled 2019-09-09 (×8): qty 1
  Filled 2019-09-09 (×3): qty 3
  Filled 2019-09-09: qty 1
  Filled 2019-09-09: qty 3

## 2019-09-09 MED ORDER — SODIUM CHLORIDE 0.9 % IV SOLN
500.0000 mg | Freq: Once | INTRAVENOUS | Status: AC
  Administered 2019-09-09: 500 mg via INTRAVENOUS
  Filled 2019-09-09: qty 10

## 2019-09-09 MED ORDER — ENOXAPARIN SODIUM 30 MG/0.3ML ~~LOC~~ SOLN
30.0000 mg | Freq: Two times a day (BID) | SUBCUTANEOUS | Status: DC
  Administered 2019-09-09 – 2019-09-20 (×23): 30 mg via SUBCUTANEOUS
  Filled 2019-09-09 (×25): qty 0.3

## 2019-09-09 NOTE — Progress Notes (Addendum)
Subjective: NAEs o/n  Objective: Vital signs in last 24 hours: Temp:  [97.3 F (36.3 C)-100.2 F (37.9 C)] 99.1 F (37.3 C) (07/04 0900) Pulse Rate:  [52-79] 79 (07/04 0900) Resp:  [10-21] 16 (07/04 0900) BP: (104-126)/(55-85) 120/85 (07/04 0900) SpO2:  [97 %-100 %] 100 % (07/04 0900) FiO2 (%):  [40 %] 40 % (07/04 0810) Weight:  [80.6 kg] 80.6 kg (07/04 0500)  Intake/Output from previous day: 07/03 0701 - 07/04 0700 In: 4127.6 [I.V.:1209.5; NG/GT:2068; IV Piggyback:850.1] Out: 0511 [Urine:1825] Intake/Output this shift: Total I/O In: 281.9 [I.V.:101.9; NG/GT:180] Out: -   On sedation.  Intubated.  Eyes open to stim. FC x 4 and centrally, weaker on the left. Flap full but soft.  Incision c/d. abd incision c/d.  Lab Results: Recent Labs    09/08/19 0431 09/09/19 0323  WBC 16.9* 16.5*  HGB 8.7* 8.7*  HCT 26.1* 25.9*  PLT 237 280   BMET Recent Labs    09/08/19 0431 09/09/19 0323  NA 139 138  K 3.4* 3.7  CL 107 105  CO2 22 24  GLUCOSE 151* 171*  BUN 11 11  CREATININE 0.64 0.59*  CALCIUM 8.0* 8.1*    Studies/Results: No results found.  Assessment/Plan: S/p hemicraniectomy for [facility], doing well - alertness, strength will need to improve prior to extubation trial - ok for lovenox for DVT prophylaxis   Vallarie Mare 09/09/2019, 10:09 AM

## 2019-09-09 NOTE — Progress Notes (Signed)
Patient ID: Jason Skinner, male   DOB: 06-19-1990, 29 y.o.   MRN: [id number] Follow up - Trauma Critical Care  Patient Details:    Jason Skinner is an 29 y.o. male.  Lines/tubes : Airway 7.5 mm (Active)  Secured at (cm) 24 cm 09/09/19 0810  Measured From Lips 09/09/19 [place] 09/09/19 0810  Secured By [employer] 09/09/19 0810  Tube Holder Repositioned Yes 09/09/19 0810  Cuff Pressure (cm H2O) 30 cm H2O 09/08/19 2000  Site Condition Dry 09/09/19 0810     NG/OG Tube Orogastric 16 Fr. Center mouth Xray Measured external length of tube (Active)  External Length of Tube (cm) - (if applicable) 50 cm 94/76/54 0800  Site Assessment Clean;Dry;Intact 09/09/19 0800  Ongoing Placement Verification No change in cm markings or external length of tube from initial placement;No change in respiratory status;No acute changes, not attributed to clinical condition 09/08/19 2000  Status Infusing tube feed 09/09/19 0800  Amount of suction 90 mmHg 09/05/19 2000  Drainage Appearance Coffee ground 09/08/19 2000  Output (mL) 0 mL 09/06/19 0800     Urethral Catheter Iona Beard L. Natale RN Straight-tip 16 Fr. (Active)  Indication for Insertion or Continuance of Catheter Acute urinary retention (I&O Cath for 24 hrs prior to catheter insertion- Inpatient Only) 09/09/19 0800  Site Assessment Clean;Intact;Dry 09/09/19 0800  Catheter Maintenance Bag below level of bladder;Catheter secured;Insertion date on drainage bag;Drainage bag/tubing not touching floor;Seal intact;Bag emptied prior to transport;No dependent loops 09/09/19 0800  Collection Container Standard drainage bag 09/09/19 0800  Securement Method Securing device (Describe) 09/09/19 0800  Urinary Catheter Interventions (if applicable) Unclamped 65/03/54 0800  Output (mL) 150 mL 09/09/19 0600    Microbiology/Sepsis markers: Results for orders placed or performed during the hospital encounter of 09/05/19  SARS Coronavirus 2 by RT  PCR (hospital order, performed in [facility] hospital lab) Nasopharyngeal Nasopharyngeal Swab     Status: None   Collection Time: 09/05/19 11:50 AM   Specimen: Nasopharyngeal Swab  Result Value Ref Range Status   SARS Coronavirus 2 NEGATIVE NEGATIVE Final    Comment: (NOTE) SARS-CoV-2 target nucleic acids are NOT DETECTED.  The SARS-CoV-2 RNA is generally detectable in upper and lower respiratory specimens during the acute phase of infection. The lowest concentration of SARS-CoV-2 viral copies this assay can detect is 250 copies / mL. A negative result does not preclude SARS-CoV-2 infection and should not be used as the sole basis for treatment or other patient management decisions.  A negative result may occur with improper specimen collection / handling, submission of specimen other than nasopharyngeal swab, presence of viral mutation(s) within the areas targeted by this assay, and inadequate number of viral copies (<250 copies / mL). A negative result must be combined with clinical observations, patient history, and epidemiological information.  Fact Sheet for Patients:   StrictlyIdeas.no  Fact Sheet for Healthcare Providers: BankingDealers.co.za  This test is not yet approved or  cleared by the [place] FDA and has been authorized for detection and/or diagnosis of SARS-CoV-2 by FDA under an Emergency Use Authorization (EUA).  This EUA will remain in effect (meaning this test can be used) for the duration of the COVID-19 declaration under Section 564(b)(1) of the Act, 21 U.S.C. section 360bbb-3(b)(1), unless the authorization is terminated or revoked sooner.  Performed at [facility], [place] [address], [place], [place] [address]   MRSA PCR Screening     Status: None   Collection Time: 09/05/19  4:49 PM   Specimen: Urine, Catheterized; Nasopharyngeal  Result Value Ref Range Status   MRSA by PCR NEGATIVE NEGATIVE Final     Comment:        The GeneXpert MRSA Assay (FDA approved for NASAL specimens only), is one component of a comprehensive MRSA colonization surveillance program. It is not intended to diagnose MRSA infection nor to guide or monitor treatment for MRSA infections. Performed at [facility], [place] [address], [place], [place] [address]     Anti-infectives:  Anti-infectives (From admission, onward)   Start     Dose/Rate Route Frequency Ordered Stop   09/05/19 1250  bacitracin 50,000 Units in sodium chloride 0.9 % 500 mL irrigation  Status:  Discontinued          As needed 09/05/19 1250 09/05/19 1414        Consults: Treatment Team:  Newman Pies, MD    Studies:    Events:  Subjective:    Overnight Issues:   Objective:  Vital signs for last 24 hours: Temp:  [97.3 F (36.3 C)-100.2 F (37.9 C)] 99.1 F (37.3 C) (07/04 0900) Pulse Rate:  [52-79] 79 (07/04 0900) Resp:  [10-21] 16 (07/04 0900) BP: (104-126)/(55-85) 120/85 (07/04 0900) SpO2:  [97 %-100 %] 100 % (07/04 0900) FiO2 (%):  [40 %] 40 % (07/04 0810) Weight:  [80.6 kg] 80.6 kg (07/04 0500)  Hemodynamic parameters for last 24 hours:    Intake/Output from previous day: 07/03 0701 - 07/04 0700 In: 4127.6 [I.V.:1209.5; NG/GT:2068; IV Piggyback:850.1] Out: 1825 [Urine:1825]  Intake/Output this shift: Total I/O In: 281.9 [I.V.:101.9; NG/GT:180] Out: -   Vent settings for last 24 hours: Vent Mode: CPAP;PSV FiO2 (%):  [40 %] 40 % Set Rate:  [18 bmp] 18 bmp Vt Set:  [580 mL] 580 mL PEEP:  [5 cmH20] 5 cmH20 Pressure Support:  [8 cmH20] 8 cmH20 Plateau Pressure:  [13 cmH20-19 cmH20] 13 cmH20  Physical Exam:  General: on vent Neuro: opens L eye to voice, F/C X 4 ext HEENT/Neck: ETT and crani wound, R face edema Resp: clear to auscultation bilaterally CVS: regular rate and rhythm, S1, S2 normal, no murmur, click, rub or gallop GI: soft, tender at flap Extremities: edema 1+  Results for  orders placed or performed during the hospital encounter of 09/05/19 (from the past 24 hour(s))  Glucose, capillary     Status: Abnormal   Collection Time: 09/08/19  9:20 AM  Result Value Ref Range   Glucose-Capillary 114 (H) 70 - 99 mg/dL  Glucose, capillary     Status: Abnormal   Collection Time: 09/08/19 12:25 PM  Result Value Ref Range   Glucose-Capillary 124 (H) 70 - 99 mg/dL  Glucose, capillary     Status: Abnormal   Collection Time: 09/08/19  4:05 PM  Result Value Ref Range   Glucose-Capillary 122 (H) 70 - 99 mg/dL  Glucose, capillary     Status: Abnormal   Collection Time: 09/08/19  7:36 PM  Result Value Ref Range   Glucose-Capillary 118 (H) 70 - 99 mg/dL  Glucose, capillary     Status: Abnormal   Collection Time: 09/08/19 11:33 PM  Result Value Ref Range   Glucose-Capillary 144 (H) 70 - 99 mg/dL  Glucose, capillary     Status: Abnormal   Collection Time: 09/09/19  3:18 AM  Result Value Ref Range   Glucose-Capillary 160 (H) 70 - 99 mg/dL  CBC     Status: Abnormal   Collection Time: 09/09/19  3:23 AM  Result Value Ref Range   WBC 16.5 (H) 4.0 - 10.5 K/uL   RBC 2.72 (L) 4.22 - 5.81 MIL/uL   Hemoglobin 8.7 (L) 13.0 - 17.0 g/dL   HCT 25.9 (L) 39 - 52 %   MCV 95.2 80.0 - 100.0 fL   MCH 32.0 26.0 - 34.0 pg   MCHC 33.6 30.0 - 36.0 g/dL   RDW 15.0 11.5 - 15.5 %   Platelets 280 150 - 400 K/uL   nRBC 0.7 (H) 0.0 - 0.2 %  Basic metabolic panel     Status: Abnormal   Collection Time: 09/09/19  3:23 AM  Result Value Ref Range   Sodium 138 135 - 145 mmol/L   Potassium 3.7 3.5 - 5.1 mmol/L   Chloride 105 98 - 111 mmol/L   CO2 24 22 - 32 mmol/L   Glucose, Bld 171 (H) 70 - 99 mg/dL   BUN 11 6 - 20 mg/dL   Creatinine, Ser 0.59 (L) 0.61 - 1.24 mg/dL   Calcium 8.1 (L) 8.9 - 10.3 mg/dL   GFR calc non Af Amer >60 >60 mL/min   GFR calc Af Amer >60 >60 mL/min   Anion gap 9 5 - 15  Glucose, capillary     Status: Abnormal   Collection Time: 09/09/19  8:28 AM  Result Value Ref  Range   Glucose-Capillary 142 (H) 70 - 99 mg/dL    Assessment & Plan: Present on Admission: . SDH (subdural hematoma) (HCC)    LOS: 4 days   Additional comments:I reviewed the patient's new clinical lab test results. .  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 Arnoldo Morale 6/30 Occipital and clival midline fxs extending into R sphenoid with hemosinus and pneumocephalus - maintain c-spine VDRF- wean, keeps having apnea so not ready yet for extubation Atelectasis and/or aspiration  L1/2 R TP fxs - pain control Leukocytosis - monitor FEN - IVF, cont TF, add oxy per tube Foley - remove today, add urecholine as it was replaced previously for retention VTE - SCDs, no LMWH yet - ok for 7/4 Plan -ICU, wean vent I spoke with his brother at the bedside and his wife on facetime Critical Care Total Time*: 10 Minutes  Georganna Skeans, MD, MPH, FACS Trauma & General Surgery Use [email or web address] to contact on call provider  09/09/2019  *Care during the described time interval was provided by me. I have reviewed this patient's available data, including medical history, events of note, physical examination and test results as part of my evaluation.

## 2019-09-09 NOTE — Plan of Care (Signed)
?  Problem: Elimination: ?Goal: Will not experience complications related to urinary retention ?Outcome: Progressing ?  ?

## 2019-09-09 NOTE — Progress Notes (Signed)
Note: Portions of this report may have been transcribed using voice recognition software. A sincere effort was made to ensure accuracy; however, inadvertent computerized transcription errors may be present.   Any transcriptional errors that result from this process are unintentional.        Jason Skinner  Jan 29, 1991 [id number]  Patient Care Team: Patient, No Pcp Per as PCP - General (General Practice)  Paged given concern of episode of ventricular tachycardia while on weaning vent protocol. Nursing notes this did happen last night but a shorter run.  Patient back in sinus rhythm.  Brother in room. EKG notes some evidence of mild ischemia - probably old  Will check troponin levels just in case.  EKG in the morning.  Given borderline anemia we will give IV iron.  Try and hold on transfusion.  Neurosurgery had seen and feels like his neurological status is stable.  Not improving fast enough but certainly not declining.  They were okay with anticoagulation so will start Lovenox.  Check hemoglobin this afternoon.  If less than 7, may need to transfuse.  Patient Active Problem List   Diagnosis Date Noted  . SDH (subdural hematoma) ([place]) 09/05/2019    No past medical history on file.    Social History   Socioeconomic History  . Marital status: Married    Spouse name: Not on file  . Number of children: Not on file  . Years of education: Not on file  . Highest education level: Not on file  Occupational History  . Not on file  Tobacco Use  . Smoking status: Not on file  Substance and Sexual Activity  . Alcohol use: Not on file  . Drug use: Not on file  . Sexual activity: Not on file  Other Topics Concern  . Not on file  Social History Narrative  . Not on file   Social Determinants of Health   Financial Resource Strain:   . Difficulty of Paying Living Expenses:   Food Insecurity:   . Worried About Charity fundraiser in the Last Year:   . Arboriculturist in the Last  Year:   Transportation Needs:   . Film/video editor (Medical):   Marland Kitchen Lack of Transportation (Non-Medical):   Physical Activity:   . Days of Exercise per Week:   . Minutes of Exercise per Session:   Stress:   . Feeling of Stress :   Social Connections:   . Frequency of Communication with Friends and Family:   . Frequency of Social Gatherings with Friends and Family:   . Attends Religious Services:   . Active Member of Clubs or Organizations:   . Attends Archivist Meetings:   Marland Kitchen Marital Status:   Intimate Partner Violence:   . Fear of Current or Ex-Partner:   . Emotionally Abused:   Marland Kitchen Physically Abused:   . Sexually Abused:     No family history on file.  Current Facility-Administered Medications  Medication Dose Route Frequency Provider Last Rate Last Admin  . 0.9 %  sodium chloride infusion (Manually program via Guardrails IV Fluids)   Intravenous Once Milford Cage, CRNA   Held at 09/05/19 1937  . 0.9 %  sodium chloride infusion   Intravenous Continuous Georganna Skeans, MD 50 mL/hr at 09/09/19 1000 Rate Verify at 09/09/19 1000  . acetaminophen (TYLENOL) tablet 1,000 mg  1,000 mg Per Tube Q6H Jesusita Oka, MD   1,000 mg at 09/09/19 0630  . ascorbic acid (  VITAMIN C) tablet 1,000 mg  1,000 mg Per Tube Barbee Cough, MD   1,000 mg at 09/09/19 0630  . bethanechol (URECHOLINE) tablet 25 mg  25 mg Per Tube TID Georganna Skeans, MD      . chlorhexidine gluconate (MEDLINE KIT) (PERIDEX) 0.12 % solution 15 mL  15 mL Mouth Rinse BID Jesusita Oka, MD   15 mL at 09/09/19 0806  . Chlorhexidine Gluconate Cloth 2 % PADS 6 each  6 each Topical Daily Georganna Skeans, MD      . enoxaparin (LOVENOX) injection 30 mg  30 mg Subcutaneous Gorden Harms, MD      . feeding supplement (PIVOT 1.5 CAL) liquid 1,000 mL  1,000 mL Per Tube Continuous Georganna Skeans, MD 60 mL/hr at 09/09/19 0024 1,000 mL at 09/09/19 0024  . fentaNYL 2523mg in NS 2596m(1010mml) infusion-PREMIX   0-400 mcg/hr Intravenous Continuous OsbSaverio DankerA-C 2.5 mL/hr at 09/09/19 1000 25 mcg/hr at 09/09/19 1000  . iron dextran complex (INFED) 25 mg in sodium chloride 0.9 % 50 mL IVPB  25 mg Intravenous Once GroMichael BostonD       Followed by  . iron dextran complex (INFED) 500 mg in sodium chloride 0.9 % 250 mL IVPB  500 mg Intravenous Once GroMichael BostonD      . levETIRAcetam (KEPPRA) IVPB 500 mg/100 mL premix  500 mg Intravenous Q12H Meuth, Brooke A, PA-C   Stopped at 09/09/19 0943  . MEDLINE mouth rinse  15 mL Mouth Rinse 10 times per day LovJesusita OkaD   15 mL at 09/09/19 0925  . methocarbamol (ROBAXIN) 500 mg in dextrose 5 % 50 mL IVPB  500 mg Intravenous Q8H LovJesusita OkaD   Stopped at 09/09/19 0650  . metoprolol tartrate (LOPRESSOR) injection 5 mg  5 mg Intravenous Q6H PRN OsbSaverio DankerA-C      . ondansetron (ZOFRAN-ODT) disintegrating tablet 4 mg  4 mg Oral Q6H PRN OsbSaverio DankerA-C       Or  . ondansetron ([facility]est [facility] 4 mg  4 mg Intravenous Q6H PRN OsbSaverio DankerA-C      . oxyCODONE (ROXICODONE) 5 MG/5ML solution 10 mg  10 mg Per Tube Q4H PRN ThoGeorganna SkeansD   10 mg at 09/09/19 0926  . pantoprazole (PROTONIX) EC tablet 40 mg  40 mg Oral Daily OsbSaverio DankerA-C       Or  . pantoprazole (PROTONIX) injection 40 mg  40 mg Intravenous Daily OsbSaverio DankerA-C   40 mg at 09/09/19 [id number] selenium tablet 200 mcg  200 mcg Per Tube Daily ThoGeorganna SkeansD   200 mcg at 09/09/19 [id number]  No Known Allergies  BP 126/71   Pulse 64   Temp 98.6 F (37 C)   Resp 17   Ht _0  (1.778 m)   Wt 80.6 kg   SpO2 100%   BMI 25.50 kg/m   DG ELBOW COMPLETE RIGHT (3+VIEW)  Result Date: 09/05/2019 CLINICAL DATA:  Pain status post fall EXAM: RIGHT ELBOW - COMPLETE 3+ VIEW COMPARISON:  None. FINDINGS: There is no evidence of fracture, dislocation, or joint effusion. There is no evidence of arthropathy or other focal bone abnormality. Soft tissues are  unremarkable. IMPRESSION: Negative. Electronically Signed   By: ChrConstance HolsterD.   On: 09/05/2019 14:53   DG Abd 1 View  Result Date: 09/05/2019 CLINICAL DATA:  Pain. EXAM: ABDOMEN - 1 VIEW COMPARISON:  CT from the same day FINDINGS: The enteric tube projects over the stomach. The visualized bowel gas pattern is unremarkable. IMPRESSION: Enteric tube projects over the stomach. Electronically Signed   By: Constance Holster M.D.   On: 09/05/2019 14:54   CT HEAD WO CONTRAST  Result Date: 09/06/2019 CLINICAL DATA:  Subdural hematoma. EXAM: CT HEAD WITHOUT CONTRAST TECHNIQUE: Contiguous axial images were obtained from the base of the skull through the vertex without intravenous contrast. COMPARISON:  Head CT 09/05/2019 FINDINGS: Brain: There postoperative changes from interval right-sided craniotomy for right subdural hematoma evacuation. There is decreased mass effect, now measuring 5 mm (previously 20 mm). Persistent although decreased leftward subfalcine herniation. There is persistent hyperdense extra-axial hemorrhage overlying the right cerebral convexity at the craniotomy site which focally measures up to 2.3 cm in thickness along the course of a right subdural drain (series 5, image 39). There is also a small amount of extra-axial pneumocephalus adjacent to the craniotomy site. Persistent small-bowel subdural hemorrhage along the falx. There has been interval blooming of hemorrhagic contusion within the right greater than left anterior frontal lobes and anterior left temporal lobe, again with overlying acute subarachnoid hemorrhage. A component of the extra-axial hemorrhage overlying the frontal pole may be subdural. There has been interval placement of a right frontal approach ventricular drain which terminates within the anterior left lateral ventricle. Trace hemorrhage within the atrium of the left lateral ventricle. No evidence of hydrocephalus. There is a small focus of pneumocephalus within  the left lateral ventricle frontal horn. Persistent although decreased effacement of the right lateral ventral Vascular: No hyperdense vessel. Skull: Right-sided craniotomy. Redemonstrated midline fracture involving the occipital calvarium and clivus with extension into the right sphenoid sinus. Sinuses/Orbits: Visualized orbits show no acute finding. Near complete opacification of the sphenoid sinuses. Air-fluid level within the right maxillary sinus. No significant mastoid effusion. Partially visualized support tubes. Other: Scalp edema overlying the right-sided craniotomy site with associated scalp staples. IMPRESSION: IMPRESSION 1. Postoperative changes from interval right-sided craniotomy for right subdural hematoma evacuation. 2. Decreased mass effect with now 5 mm leftward midline shift (previously 20 mm). Persistent although decreased leftward subfalcine herniation. 3. There is some persistent extra-axial hemorrhage overlying the right cerebral convexity at the craniotomy site which focally measures up to 2.3 cm in thickness along the course of a subdural drain. Additionally, there is persistent small volume subdural hemorrhage along the falx. 4. Interval blooming of hemorrhagic contusion involving the right greater than left anterior frontal lobes and left temporal lobe with redemonstrated overlying acute subarachnoid hemorrhage. 5. Interval placement of a right frontal approach ventricular drain terminating within the anterior left lateral ventricle. No evidence of hydrocephalus. Trace hemorrhage within the atrium of the left lateral ventricle. Electronically Signed   By: Kellie Simmering DO   On: 09/06/2019 07:39   CT HEAD WO CONTRAST  Result Date: 09/05/2019 CLINICAL DATA:  Level 1 trauma EXAM: CT HEAD WITHOUT CONTRAST CT MAXILLOFACIAL WITHOUT CONTRAST CT CERVICAL SPINE WITHOUT CONTRAST TECHNIQUE: Multidetector CT imaging of the head, cervical spine, and maxillofacial structures were performed using the  standard protocol without intravenous contrast. Multiplanar CT image reconstructions of the cervical spine and maxillofacial structures were also generated. COMPARISON:  None. FINDINGS: CT HEAD FINDINGS Brain: Mixed density subdural hematoma along the right cerebral convexity and left frontal pole. Mixed density could be from a continued and un clotted bleeding, especially on the right. The right-sided hematoma measures up to 15 mm in thickness. Midline shift measures up to  2 cm. Pneumocephalus with intraventricular extension. Right uncal herniation. Mix of contusion and subarachnoid hemorrhage over the left temporal pole which is edematous. No visible infarct. Vascular: Negative Skull: Midline skull fracture involving the occiput and clivus. There is extension into the right sphenoid sinus, which accounts for pneumocephalus and sinus opacification. Posterior scalp contusion CT MAXILLOFACIAL FINDINGS Osseous: Intact and located mandible. Midline clivus fracture extending to the right sphenoid sinus. Orbits: No evidence of injury. Sinuses: Right sphenoid and maxillary hemosinus Soft tissues: Negative CT CERVICAL SPINE FINDINGS Alignment: Normal Skull base and vertebrae: Clival fracture. No cervical spine fracture. Soft tissues and spinal canal: Gas present around the dens. The tectorial membrane is continuous and visible. No visible canal hematoma Disc levels:  No degenerative changes or visible impingement. Upper chest: Reported separately Critical Value/emergent results were discussed in person on 09/05/2019 at 11:35 am with Dr Bobbye Morton. IMPRESSION: Head CT: 1. Right more than left cerebral convexity subdural hematoma with leftward midline shift of 2 cm and right uncal herniation. 2. Left temporal pole contusion with overlying subarachnoid hemorrhage. 3. Occipital and clival midline fractures extending into the right sphenoid sinus with pneumocephalus. Cervical spine CT: Gas around the dens presumably related to the  skull base fracture. Craniocervical alignment is normal and the tectorial membrane is visible. Alar or other ligamentous injury is still conceivable. Maxillofacial CT: Clival fracture extending into the right sphenoid sinus with hemosinus and pneumocephalus. Electronically Signed   By: Monte Fantasia M.D.   On: 09/05/2019 11:40   CT CHEST W CONTRAST  Result Date: 09/05/2019 CLINICAL DATA:  Moderate to severe chest trauma EXAM: CT CHEST, ABDOMEN, AND PELVIS WITH CONTRAST TECHNIQUE: Multidetector CT imaging of the chest, abdomen and pelvis was performed following the standard protocol during bolus administration of intravenous contrast. CONTRAST:  Dose is currently not available COMPARISON:  None. FINDINGS: CT CHEST FINDINGS Cardiovascular: Normal heart size. No pericardial effusion. No evidence of great vessel injury. Mediastinum/Nodes: No hematoma or pneumomediastinum. There is a coarse calcification in the right lobe of the thyroid which is asymmetrically larger with a 2 cm nodular projection extending inferiorly that is isodense to the remaining gland. Lungs/Pleura: No hemothorax, pneumothorax, or lung contusion. Dependent opacity with volume loss is likely atelectasis and aspiration. Musculoskeletal: No acute finding CT ABDOMEN PELVIS FINDINGS Hepatobiliary: No hepatic injury or perihepatic hematoma. Gallbladder is unremarkable Pancreas: Negative Spleen: Left upper quadrant poly splenia or localize splenosis. No acute injury. Adrenals/Urinary Tract: No adrenal hemorrhage or renal injury identified. Bladder is unremarkable. Stomach/Bowel: No evidence of injury Vascular/Lymphatic: No acute vascular finding Reproductive: Negative Other: No ascites or pneumoperitoneum. Musculoskeletal: L1 and L2 right transverse process fractures. Vacuum phenomenon around the sacroiliac joints without diastasis IMPRESSION: 1. L1 and L2 right transverse process fractures. 2. Bilateral dependent atelectasis and or aspiration. 3. 2  cm nodular projection from the right thyroid lobe. After convalescence, recommend thyroid US(Ref: J Am Coll Radiol. 2015 Feb;12(2): 143-50). Electronically Signed   By: Monte Fantasia M.D.   On: 09/05/2019 11:45   CT CERVICAL SPINE WO CONTRAST  Result Date: 09/05/2019 CLINICAL DATA:  Level 1 trauma EXAM: CT HEAD WITHOUT CONTRAST CT MAXILLOFACIAL WITHOUT CONTRAST CT CERVICAL SPINE WITHOUT CONTRAST TECHNIQUE: Multidetector CT imaging of the head, cervical spine, and maxillofacial structures were performed using the standard protocol without intravenous contrast. Multiplanar CT image reconstructions of the cervical spine and maxillofacial structures were also generated. COMPARISON:  None. FINDINGS: CT HEAD FINDINGS Brain: Mixed density subdural hematoma along the right cerebral convexity and  left frontal pole. Mixed density could be from a continued and un clotted bleeding, especially on the right. The right-sided hematoma measures up to 15 mm in thickness. Midline shift measures up to 2 cm. Pneumocephalus with intraventricular extension. Right uncal herniation. Mix of contusion and subarachnoid hemorrhage over the left temporal pole which is edematous. No visible infarct. Vascular: Negative Skull: Midline skull fracture involving the occiput and clivus. There is extension into the right sphenoid sinus, which accounts for pneumocephalus and sinus opacification. Posterior scalp contusion CT MAXILLOFACIAL FINDINGS Osseous: Intact and located mandible. Midline clivus fracture extending to the right sphenoid sinus. Orbits: No evidence of injury. Sinuses: Right sphenoid and maxillary hemosinus Soft tissues: Negative CT CERVICAL SPINE FINDINGS Alignment: Normal Skull base and vertebrae: Clival fracture. No cervical spine fracture. Soft tissues and spinal canal: Gas present around the dens. The tectorial membrane is continuous and visible. No visible canal hematoma Disc levels:  No degenerative changes or visible  impingement. Upper chest: Reported separately Critical Value/emergent results were discussed in person on 09/05/2019 at 11:35 am with Dr Bobbye Morton. IMPRESSION: Head CT: 1. Right more than left cerebral convexity subdural hematoma with leftward midline shift of 2 cm and right uncal herniation. 2. Left temporal pole contusion with overlying subarachnoid hemorrhage. 3. Occipital and clival midline fractures extending into the right sphenoid sinus with pneumocephalus. Cervical spine CT: Gas around the dens presumably related to the skull base fracture. Craniocervical alignment is normal and the tectorial membrane is visible. Alar or other ligamentous injury is still conceivable. Maxillofacial CT: Clival fracture extending into the right sphenoid sinus with hemosinus and pneumocephalus. Electronically Signed   By: Monte Fantasia M.D.   On: 09/05/2019 11:40   CT ABDOMEN PELVIS W CONTRAST  Result Date: 09/05/2019 CLINICAL DATA:  Moderate to severe chest trauma EXAM: CT CHEST, ABDOMEN, AND PELVIS WITH CONTRAST TECHNIQUE: Multidetector CT imaging of the chest, abdomen and pelvis was performed following the standard protocol during bolus administration of intravenous contrast. CONTRAST:  Dose is currently not available COMPARISON:  None. FINDINGS: CT CHEST FINDINGS Cardiovascular: Normal heart size. No pericardial effusion. No evidence of great vessel injury. Mediastinum/Nodes: No hematoma or pneumomediastinum. There is a coarse calcification in the right lobe of the thyroid which is asymmetrically larger with a 2 cm nodular projection extending inferiorly that is isodense to the remaining gland. Lungs/Pleura: No hemothorax, pneumothorax, or lung contusion. Dependent opacity with volume loss is likely atelectasis and aspiration. Musculoskeletal: No acute finding CT ABDOMEN PELVIS FINDINGS Hepatobiliary: No hepatic injury or perihepatic hematoma. Gallbladder is unremarkable Pancreas: Negative Spleen: Left upper quadrant poly  splenia or localize splenosis. No acute injury. Adrenals/Urinary Tract: No adrenal hemorrhage or renal injury identified. Bladder is unremarkable. Stomach/Bowel: No evidence of injury Vascular/Lymphatic: No acute vascular finding Reproductive: Negative Other: No ascites or pneumoperitoneum. Musculoskeletal: L1 and L2 right transverse process fractures. Vacuum phenomenon around the sacroiliac joints without diastasis IMPRESSION: 1. L1 and L2 right transverse process fractures. 2. Bilateral dependent atelectasis and or aspiration. 3. 2 cm nodular projection from the right thyroid lobe. After convalescence, recommend thyroid US(Ref: J Am Coll Radiol. 2015 Feb;12(2): 143-50). Electronically Signed   By: Monte Fantasia M.D.   On: 09/05/2019 11:45   DG Pelvis Portable  Result Date: 09/05/2019 CLINICAL DATA:  Level 1 trauma. Fell 20 feet. EXAM: PORTABLE PELVIS 1-2 VIEWS COMPARISON:  None. FINDINGS: Both hips are normally located. No hip fracture. The pubic symphysis and SI joints are intact. No definite pelvic fractures. IMPRESSION: No acute  bony findings. Electronically Signed   By: Marijo Sanes M.D.   On: 09/05/2019 11:22   DG Chest Port 1 View  Result Date: 09/06/2019 CLINICAL DATA:  Fall. EXAM: PORTABLE CHEST 1 VIEW COMPARISON:  Chest CT and chest x-ray 09/05/2019. FINDINGS: Endotracheal tube noted stable position. NG tube noted with tip coiled in stomach. Mediastinum hilar structures normal. Heart size normal. Lungs scratched it mild right mid lung subsegmental atelectasis. No focal alveolar infiltrate. No pleural effusion or pneumothorax. No acute bony abnormality identified IMPRESSION: 1. Endotracheal tube in stable position. NG tube noted with tip coiled in stomach. 2. Mild right base subsegmental atelectasis. No acute cardiopulmonary disease otherwise noted. Electronically Signed   By: Marcello Moores  Register   On: 09/06/2019 06:48   DG Chest Port 1 View  Result Date: 09/05/2019 CLINICAL DATA:  Level 1  trauma. Fell 20 feet. EXAM: PORTABLE CHEST 1 VIEW COMPARISON:  None. FINDINGS: The endotracheal tube is in good position, 3.8 cm above the carina. The cardiac silhouette, mediastinal and hilar contours are within normal limits. The lungs are clear. No pleural effusion or pneumothorax. The bony thorax is grossly intact. No obvious acute rib fractures. Remote healed mid left clavicle fracture is noted. IMPRESSION: 1. Endotracheal tube in good position, 3.8 cm above the carina. 2. No acute cardiopulmonary findings. Electronically Signed   By: Marijo Sanes M.D.   On: 09/05/2019 11:22   CT MAXILLOFACIAL WO CONTRAST  Result Date: 09/05/2019 CLINICAL DATA:  Level 1 trauma EXAM: CT HEAD WITHOUT CONTRAST CT MAXILLOFACIAL WITHOUT CONTRAST CT CERVICAL SPINE WITHOUT CONTRAST TECHNIQUE: Multidetector CT imaging of the head, cervical spine, and maxillofacial structures were performed using the standard protocol without intravenous contrast. Multiplanar CT image reconstructions of the cervical spine and maxillofacial structures were also generated. COMPARISON:  None. FINDINGS: CT HEAD FINDINGS Brain: Mixed density subdural hematoma along the right cerebral convexity and left frontal pole. Mixed density could be from a continued and un clotted bleeding, especially on the right. The right-sided hematoma measures up to 15 mm in thickness. Midline shift measures up to 2 cm. Pneumocephalus with intraventricular extension. Right uncal herniation. Mix of contusion and subarachnoid hemorrhage over the left temporal pole which is edematous. No visible infarct. Vascular: Negative Skull: Midline skull fracture involving the occiput and clivus. There is extension into the right sphenoid sinus, which accounts for pneumocephalus and sinus opacification. Posterior scalp contusion CT MAXILLOFACIAL FINDINGS Osseous: Intact and located mandible. Midline clivus fracture extending to the right sphenoid sinus. Orbits: No evidence of injury.  Sinuses: Right sphenoid and maxillary hemosinus Soft tissues: Negative CT CERVICAL SPINE FINDINGS Alignment: Normal Skull base and vertebrae: Clival fracture. No cervical spine fracture. Soft tissues and spinal canal: Gas present around the dens. The tectorial membrane is continuous and visible. No visible canal hematoma Disc levels:  No degenerative changes or visible impingement. Upper chest: Reported separately Critical Value/emergent results were discussed in person on 09/05/2019 at 11:35 am with Dr Bobbye Morton. IMPRESSION: Head CT: 1. Right more than left cerebral convexity subdural hematoma with leftward midline shift of 2 cm and right uncal herniation. 2. Left temporal pole contusion with overlying subarachnoid hemorrhage. 3. Occipital and clival midline fractures extending into the right sphenoid sinus with pneumocephalus. Cervical spine CT: Gas around the dens presumably related to the skull base fracture. Craniocervical alignment is normal and the tectorial membrane is visible. Alar or other ligamentous injury is still conceivable. Maxillofacial CT: Clival fracture extending into the right sphenoid sinus with hemosinus and pneumocephalus.  Electronically Signed   By: Monte Fantasia M.D.   On: 09/05/2019 11:40

## 2019-09-10 LAB — BASIC METABOLIC PANEL
Anion gap: 11 (ref 5–15)
BUN: 12 mg/dL (ref 6–20)
CO2: 25 mmol/L (ref 22–32)
Calcium: 8.6 mg/dL — ABNORMAL LOW (ref 8.9–10.3)
Chloride: 100 mmol/L (ref 98–111)
Creatinine, Ser: 0.54 mg/dL — ABNORMAL LOW (ref 0.61–1.24)
GFR calc Af Amer: >60 mL/min (ref >60)
GFR calc non Af Amer: >60 mL/min (ref >60)
Glucose, Bld: 166 mg/dL — ABNORMAL HIGH (ref 70–99)
Potassium: 3.6 mmol/L (ref 3.5–5.1)
Sodium: 136 mmol/L (ref 135–145)

## 2019-09-10 LAB — CBC
HCT: 26.2 % — ABNORMAL LOW (ref 39.0–52.0)
Hemoglobin: 8.8 g/dL — ABNORMAL LOW (ref 13.0–17.0)
MCH: 31.7 pg (ref 26.0–34.0)
MCHC: 33.6 g/dL (ref 30.0–36.0)
MCV: 94.2 fL (ref 80.0–100.0)
Platelets: 332 10*3/uL (ref 150–400)
RBC: 2.78 MIL/uL — ABNORMAL LOW (ref 4.22–5.81)
RDW: 14.7 % (ref 11.5–15.5)
WBC: 18.5 10*3/uL — ABNORMAL HIGH (ref 4.0–10.5)
nRBC: 2.3 % — ABNORMAL HIGH (ref 0.0–0.2)

## 2019-09-10 LAB — GLUCOSE, CAPILLARY
Glucose-Capillary: 138 mg/dL — ABNORMAL HIGH (ref 70–99)
Glucose-Capillary: 150 mg/dL — ABNORMAL HIGH (ref 70–99)
Glucose-Capillary: 153 mg/dL — ABNORMAL HIGH (ref 70–99)
Glucose-Capillary: 124 mg/dL — ABNORMAL HIGH (ref 70–99)
Glucose-Capillary: 132 mg/dL — ABNORMAL HIGH (ref 70–99)
Glucose-Capillary: 111 mg/dL — ABNORMAL HIGH (ref 70–99)

## 2019-09-10 MED ORDER — MORPHINE SULFATE (PF) 2 MG/ML IV SOLN
2.0000 mg | INTRAVENOUS | Status: DC | PRN
  Administered 2019-09-10: 2 mg via INTRAVENOUS
  Filled 2019-09-10 (×2): qty 1

## 2019-09-10 NOTE — Progress Notes (Signed)
Subjective: NAEs o/n  Objective: Vital signs in last 24 hours: Temp:  [97.9 F (36.6 C)-101.1 F (38.4 C)] 97.9 F (36.6 C) (07/05 0900) Pulse Rate:  [50-69] 69 (07/05 0900) Resp:  [18-22] 21 (07/05 0900) BP: (114-142)/(63-101) 142/101 (07/05 0900) SpO2:  [96 %-100 %] 99 % (07/05 0900) FiO2 (%):  [40 %] 40 % (07/05 0803) Weight:  [81.1 kg] 81.1 kg (07/05 0500)  Intake/Output from previous day: 07/04 0701 - 07/05 0700 In: 3264.1 [I.V.:1284.7; NG/GT:1320; IV Piggyback:659.4] Out: 1600 [Urine:1600] Intake/Output this shift: No intake/output data recorded.  Dysconjugate gaze (left pupil down/out), pupils reactive bilaterally Alert, FC x 4 (2/5 on left) Flap soft but full  Lab Results: Recent Labs    09/09/19 0323 09/09/19 0323 09/09/19 1228 09/10/19 0716  WBC 16.5*  --   --  18.5*  HGB 8.7*   < > 8.7* 8.8*  HCT 25.9*  --   --  26.2*  PLT 280  --   --  332   < > = values in this interval not displayed.   BMET Recent Labs    09/09/19 0323 09/10/19 0716  NA 138 136  K 3.7 3.6  CL 105 100  CO2 24 25  GLUCOSE 171* 166*  BUN 11 12  CREATININE 0.59* 0.54*  CALCIUM 8.1* 8.6*    Studies/Results: No results found.  Assessment/Plan: S/p right hemicrani for TBI - possible extubation today - cont supportive care   Vallarie Mare 09/10/2019, 10:12 AM

## 2019-09-10 NOTE — Progress Notes (Signed)
Follow up - Trauma and Critical Care  Patient Details:    Jason Skinner is an 29 y.o. male.  Lines/tubes : Airway 7.5 mm (Active)  Secured at (cm) 24 cm 09/10/19 0800  Measured From Lips 09/10/19 0800  Secured Location Center 09/10/19 0800  Secured By [employer] 09/10/19 0800  Tube Holder Repositioned Yes 09/10/19 0800  Cuff Pressure (cm H2O) 20 cm H2O 09/10/19 0800  Site Condition Dry 09/10/19 0800     NG/OG Tube Orogastric 16 Fr. Center mouth Xray Measured external length of tube (Active)  External Length of Tube (cm) - (if applicable) 50 cm 95/09/32 2000  Site Assessment Clean;Dry;Intact 09/09/19 2000  Ongoing Placement Verification No change in cm markings or external length of tube from initial placement 09/09/19 2000  Status Infusing tube feed 09/09/19 2000  Amount of suction 90 mmHg 09/09/19 2000  Drainage Appearance Coffee ground 09/09/19 2000  Output (mL) 0 mL 09/06/19 0800     External Urinary Catheter (Active)  Output (mL) 400 mL 09/10/19 0400    Microbiology/Sepsis markers: Results for orders placed or performed during the hospital encounter of 09/05/19  SARS Coronavirus 2 by RT PCR (hospital order, performed in [facility] hospital lab) Nasopharyngeal Nasopharyngeal Swab     Status: None   Collection Time: 09/05/19 11:50 AM   Specimen: Nasopharyngeal Swab  Result Value Ref Range Status   SARS Coronavirus 2 NEGATIVE NEGATIVE Final    Comment: (NOTE) SARS-CoV-2 target nucleic acids are NOT DETECTED.  The SARS-CoV-2 RNA is generally detectable in upper and lower respiratory specimens during the acute phase of infection. The lowest concentration of SARS-CoV-2 viral copies this assay can detect is 250 copies / mL. A negative result does not preclude SARS-CoV-2 infection and should not be used as the sole basis for treatment or other patient management decisions.  A negative result may occur with improper specimen collection / handling, submission of  specimen other than nasopharyngeal swab, presence of viral mutation(s) within the areas targeted by this assay, and inadequate number of viral copies (<250 copies / mL). A negative result must be combined with clinical observations, patient history, and epidemiological information.  Fact Sheet for Patients:   StrictlyIdeas.no  Fact Sheet for Healthcare Providers: BankingDealers.co.za  This test is not yet approved or  cleared by the [place] FDA and has been authorized for detection and/or diagnosis of SARS-CoV-2 by FDA under an Emergency Use Authorization (EUA).  This EUA will remain in effect (meaning this test can be used) for the duration of the COVID-19 declaration under Section 564(b)(1) of the Act, 21 U.S.C. section 360bbb-3(b)(1), unless the authorization is terminated or revoked sooner.  Performed at [facility], [place] [address] [place], [place] [address]   MRSA PCR Screening     Status: None   Collection Time: 09/05/19  4:49 PM   Specimen: Urine, Catheterized; Nasopharyngeal  Result Value Ref Range Status   MRSA by PCR NEGATIVE NEGATIVE Final    Comment:        The GeneXpert MRSA Assay (FDA approved for NASAL specimens only), is one component of a comprehensive MRSA colonization surveillance program. It is not intended to diagnose MRSA infection nor to guide or monitor treatment for MRSA infections. Performed at [facility], [place] [address], [place], [place] [address]     Anti-infectives:  Anti-infectives (From admission, onward)   Start     Dose/Rate Route Frequency Ordered Stop   09/05/19 1250  bacitracin 50,000  Units in sodium chloride 0.9 % 500 mL irrigation  Status:  Discontinued          As needed 09/05/19 1250 09/05/19 1414      Consults: Treatment Team:  Newman Pies, MD   Chief Complaint/Subjective:    Overnight Issues: No issues overnight  Objective:  Vital signs for  last 24 hours: Temp:  [97.9 F (36.6 C)-101.1 F (38.4 C)] 97.9 F (36.6 C) (07/05 0814) Pulse Rate:  [50-79] 57 (07/05 0800) Resp:  [16-22] 18 (07/05 0800) BP: (114-140)/(63-85) 130/83 (07/05 0800) SpO2:  [96 %-100 %] 99 % (07/05 0800) FiO2 (%):  [40 %] 40 % (07/05 0803) Weight:  [81.1 kg] 81.1 kg (07/05 0500)  Hemodynamic parameters for last 24 hours:    Intake/Output from previous day: 07/04 0701 - 07/05 0700 In: 3264.1 [I.V.:1284.7; NG/GT:1320; IV Piggyback:659.4] Out: 1600 [Urine:1600]  Intake/Output this shift: No intake/output data recorded.  Vent settings for last 24 hours: Vent Mode: CPAP;PSV FiO2 (%):  [40 %] 40 % Set Rate:  [18 bmp] 18 bmp Vt Set:  [580 mL] 580 mL PEEP:  [5 cmH20] 5 cmH20 Pressure Support:  [8 cmH20] 8 cmH20 Plateau Pressure:  [18 cmH20-19 cmH20] 18 cmH20  Physical Exam:  Gen: ventilated, sedated HEENT: ETT in position, right sided staples in place Resp: initiating breaths, no distress Cardiovascular: bradycardic Abdomen: soft, NT, ND Ext: no edema Neuro: moves all extremities, follows some commands, does not open eyes  Results for orders placed or performed during the hospital encounter of 09/05/19 (from the past 24 hour(s))  Troponin I (High Sensitivity)     Status: None   Collection Time: 09/09/19 10:32 AM  Result Value Ref Range   Troponin I (High Sensitivity) 6 <18 ng/L  Magnesium     Status: None   Collection Time: 09/09/19 10:32 AM  Result Value Ref Range   Magnesium 2.0 1.7 - 2.4 mg/dL  Phosphorus     Status: None   Collection Time: 09/09/19 10:32 AM  Result Value Ref Range   Phosphorus 3.3 2.5 - 4.6 mg/dL  Hemoglobin     Status: Abnormal   Collection Time: 09/09/19 12:28 PM  Result Value Ref Range   Hemoglobin 8.7 (L) 13.0 - 17.0 g/dL  Troponin I (High Sensitivity)     Status: None   Collection Time: 09/09/19 12:28 PM  Result Value Ref Range   Troponin I (High Sensitivity) 5 <18 ng/L  Glucose, capillary     Status:  Abnormal   Collection Time: 09/09/19 12:28 PM  Result Value Ref Range   Glucose-Capillary 142 (H) 70 - 99 mg/dL  Glucose, capillary     Status: Abnormal   Collection Time: 09/09/19  3:58 PM  Result Value Ref Range   Glucose-Capillary 134 (H) 70 - 99 mg/dL  Glucose, capillary     Status: Abnormal   Collection Time: 09/09/19  7:29 PM  Result Value Ref Range   Glucose-Capillary 145 (H) 70 - 99 mg/dL  Glucose, capillary     Status: Abnormal   Collection Time: 09/09/19 11:16 PM  Result Value Ref Range   Glucose-Capillary 145 (H) 70 - 99 mg/dL  Glucose, capillary     Status: Abnormal   Collection Time: 09/10/19  3:24 AM  Result Value Ref Range   Glucose-Capillary 138 (H) 70 - 99 mg/dL  CBC     Status: Abnormal   Collection Time: 09/10/19  7:16 AM  Result Value Ref Range   WBC 18.5 (H) 4.0 - 10.5  K/uL   RBC 2.78 (L) 4.22 - 5.81 MIL/uL   Hemoglobin 8.8 (L) 13.0 - 17.0 g/dL   HCT 26.2 (L) 39 - 52 %   MCV 94.2 80.0 - 100.0 fL   MCH 31.7 26.0 - 34.0 pg   MCHC 33.6 30.0 - 36.0 g/dL   RDW 14.7 11.5 - 15.5 %   Platelets 332 150 - 400 K/uL   nRBC 2.3 (H) 0.0 - 0.2 %  Basic metabolic panel     Status: Abnormal (Preliminary result)   Collection Time: 09/10/19  7:16 AM  Result Value Ref Range   Sodium 136 135 - 145 mmol/L   Potassium 3.6 3.5 - 5.1 mmol/L   Chloride 100 98 - 111 mmol/L   CO2 25 22 - 32 mmol/L   Glucose, Bld 166 (H) 70 - 99 mg/dL   BUN 12 6 - 20 mg/dL   Creatinine, Ser PENDING 0.61 - 1.24 mg/dL   Calcium 8.6 (L) 8.9 - 10.3 mg/dL   GFR calc non Af Amer PENDING >60 mL/min   GFR calc Af Amer PENDING >60 mL/min   Anion gap 11 5 - 15  Glucose, capillary     Status: Abnormal   Collection Time: 09/10/19  7:58 AM  Result Value Ref Range   Glucose-Capillary 150 (H) 70 - 99 mg/dL     Assessment/Plan:   Fall  B SDH R>L with midline shift and uncal herniation, L temporal contusion and SAH-s/pdecompressive R craniectomy and placement L EVD by Dr. Arnoldo Morale 6/30 Occipital  and clival midline fxs extending into R sphenoid with hemosinus and pneumocephalus- maintain c-spine VDRF- wean, initiating breaths today Atelectasis and/or aspiration  L1/2 R TP fxs- pain control Leukocytosis - monitor FEN - IVF,contTF, add oxy per tube Foley -removed 7/4, urecholine as it was replaced previously for retention VTE - SCDs, LMWH Plan -ICU, wean vent   LOS: 5 days   Additional comments:I reviewed the patient's new clinical lab test results. hemoglobin and creatinine stable and I have discussed and reviewed with family members patient's next goals  Critical Care Total Time*: 30 Minutes  Arta Bruce Lacreshia Bondarenko 09/10/2019  *Care during the described time interval was provided by me and/or other providers on the critical care team.  I have reviewed this patient's available data, including medical history, events of note, physical examination and test results as part of my evaluation.

## 2019-09-10 NOTE — Progress Notes (Signed)
Patient calm and cooperative at start of shift. Soft wrist restraints removed and mitts applied.

## 2019-09-10 NOTE — Procedures (Signed)
Extubation Procedure Note  Patient Details:   Name: Jason Skinner DOB: Feb 29, 1991 MRN: [id number]   Airway Documentation:    Vent end date: 09/10/19 Vent end time: 1043   Evaluation  O2 sats: stable throughout Complications: No apparent complications Patient did tolerate procedure well. Bilateral Breath Sounds: Clear, Diminished   Yes   Pt extubated to 4L N/C.  No stridor noted.  RN @ bedside.  Donnetta Hail 09/10/2019, 10:44 AM

## 2019-09-11 ENCOUNTER — Inpatient Hospital Stay (HOSPITAL_COMMUNITY): Payer: Medicaid Other

## 2019-09-11 LAB — URINALYSIS, ROUTINE W REFLEX MICROSCOPIC
Bilirubin Urine: NEGATIVE
Glucose, UA: NEGATIVE mg/dL
Hgb urine dipstick: NEGATIVE
Ketones, ur: NEGATIVE mg/dL
Leukocytes,Ua: NEGATIVE
Nitrite: NEGATIVE
Protein, ur: NEGATIVE mg/dL
Specific Gravity, Urine: 1.016 (ref 1.005–1.030)
pH: 7 (ref 5.0–8.0)

## 2019-09-11 LAB — CBC
HCT: 31.2 % — ABNORMAL LOW (ref 39.0–52.0)
Hemoglobin: 10.3 g/dL — ABNORMAL LOW (ref 13.0–17.0)
MCH: 30.7 pg (ref 26.0–34.0)
MCHC: 33 g/dL (ref 30.0–36.0)
MCV: 93.1 fL (ref 80.0–100.0)
Platelets: 424 10*3/uL — ABNORMAL HIGH (ref 150–400)
RBC: 3.35 MIL/uL — ABNORMAL LOW (ref 4.22–5.81)
RDW: 14.5 % (ref 11.5–15.5)
WBC: 19.3 10*3/uL — ABNORMAL HIGH (ref 4.0–10.5)
nRBC: 3.9 % — ABNORMAL HIGH (ref 0.0–0.2)

## 2019-09-11 LAB — BASIC METABOLIC PANEL
Anion gap: 13 (ref 5–15)
Anion gap: 10 (ref 5–15)
BUN: 12 mg/dL (ref 6–20)
BUN: 14 mg/dL (ref 6–20)
CO2: 25 mmol/L (ref 22–32)
CO2: 24 mmol/L (ref 22–32)
Calcium: 9.1 mg/dL (ref 8.9–10.3)
Calcium: 8.6 mg/dL — ABNORMAL LOW (ref 8.9–10.3)
Chloride: 94 mmol/L — ABNORMAL LOW (ref 98–111)
Chloride: 97 mmol/L — ABNORMAL LOW (ref 98–111)
Creatinine, Ser: 0.6 mg/dL — ABNORMAL LOW (ref 0.61–1.24)
Creatinine, Ser: 0.66 mg/dL (ref 0.61–1.24)
GFR calc Af Amer: >60 mL/min (ref >60)
GFR calc Af Amer: >60 mL/min (ref >60)
GFR calc non Af Amer: >60 mL/min (ref >60)
GFR calc non Af Amer: >60 mL/min (ref >60)
Glucose, Bld: 114 mg/dL — ABNORMAL HIGH (ref 70–99)
Glucose, Bld: 120 mg/dL — ABNORMAL HIGH (ref 70–99)
Potassium: 3.7 mmol/L (ref 3.5–5.1)
Potassium: 3.6 mmol/L (ref 3.5–5.1)
Sodium: 132 mmol/L — ABNORMAL LOW (ref 135–145)
Sodium: 131 mmol/L — ABNORMAL LOW (ref 135–145)

## 2019-09-11 LAB — GLUCOSE, CAPILLARY
Glucose-Capillary: 121 mg/dL — ABNORMAL HIGH (ref 70–99)
Glucose-Capillary: 116 mg/dL — ABNORMAL HIGH (ref 70–99)
Glucose-Capillary: 109 mg/dL — ABNORMAL HIGH (ref 70–99)
Glucose-Capillary: 115 mg/dL — ABNORMAL HIGH (ref 70–99)
Glucose-Capillary: 109 mg/dL — ABNORMAL HIGH (ref 70–99)
Glucose-Capillary: 123 mg/dL — ABNORMAL HIGH (ref 70–99)

## 2019-09-11 MED ORDER — MORPHINE SULFATE (PF) 2 MG/ML IV SOLN
2.0000 mg | INTRAVENOUS | Status: DC | PRN
  Administered 2019-09-13 (×2): 2 mg via INTRAVENOUS
  Filled 2019-09-11 (×2): qty 1

## 2019-09-11 MED ORDER — OXYCODONE HCL 5 MG/5ML PO SOLN
5.0000 mg | ORAL | Status: DC | PRN
  Administered 2019-09-12 – 2019-09-13 (×2): 5 mg
  Administered 2019-09-15 – 2019-09-17 (×4): 10 mg
  Filled 2019-09-11: qty 10
  Filled 2019-09-11: qty 5
  Filled 2019-09-11 (×2): qty 10
  Filled 2019-09-11: qty 5
  Filled 2019-09-11 (×5): qty 10

## 2019-09-11 MED ORDER — ACETAMINOPHEN 650 MG RE SUPP
650.0000 mg | Freq: Four times a day (QID) | RECTAL | Status: DC | PRN
  Administered 2019-09-11: 650 mg via RECTAL
  Filled 2019-09-11: qty 1

## 2019-09-11 MED ORDER — SODIUM CHLORIDE 0.9 % IV BOLUS
1000.0000 mL | Freq: Once | INTRAVENOUS | Status: AC
  Administered 2019-09-11: 1000 mL via INTRAVENOUS

## 2019-09-11 NOTE — Progress Notes (Addendum)
Trauma/Critical Care Follow Up Note  Subjective:    Overnight Issues:   Objective:  Vital signs for last 24 hours: Temp:  [98 F (36.7 C)-101.4 F (38.6 C)] 98 F (36.7 C) (07/06 1200) Pulse Rate:  [45-116] 83 (07/06 1100) Resp:  [16-32] 26 (07/06 1100) BP: (123-149)/(64-95) 127/72 (07/06 1100) SpO2:  [88 %-100 %] 96 % (07/06 1100) Weight:  [82.3 kg] 82.3 kg (07/06 0500)  Hemodynamic parameters for last 24 hours:    Intake/Output from previous day: 07/05 0701 - 07/06 0700 In: 2118.3 [I.V.:1128.3; NG/GT:240; IV Piggyback:750] Out: 5900 [Urine:5900]  Intake/Output this shift: Total I/O In: 137.4 [I.V.:137.4] Out: -   Vent settings for last 24 hours:    Physical Exam:  Gen: comfortable, no distress Neuro: non-focal exam, follow commands HEENT: PERRL Neck: supple CV: RRR Pulm: unlabored breathing on RA Abd: soft, NT GU: clear yellow urine, condom cath Extr: wwp, no edema   Results for orders placed or performed during the hospital encounter of 09/05/19 (from the past 24 hour(s))  Glucose, capillary     Status: Abnormal   Collection Time: 09/10/19  3:33 PM  Result Value Ref Range   Glucose-Capillary 124 (H) 70 - 99 mg/dL  Glucose, capillary     Status: Abnormal   Collection Time: 09/10/19  7:23 PM  Result Value Ref Range   Glucose-Capillary 132 (H) 70 - 99 mg/dL  Glucose, capillary     Status: Abnormal   Collection Time: 09/10/19 11:24 PM  Result Value Ref Range   Glucose-Capillary 111 (H) 70 - 99 mg/dL  Glucose, capillary     Status: Abnormal   Collection Time: 09/11/19  3:23 AM  Result Value Ref Range   Glucose-Capillary 121 (H) 70 - 99 mg/dL  CBC     Status: Abnormal   Collection Time: 09/11/19  5:45 AM  Result Value Ref Range   WBC 19.3 (H) 4.0 - 10.5 K/uL   RBC 3.35 (L) 4.22 - 5.81 MIL/uL   Hemoglobin 10.3 (L) 13.0 - 17.0 g/dL   HCT 31.2 (L) 39 - 52 %   MCV 93.1 80.0 - 100.0 fL   MCH 30.7 26.0 - 34.0 pg   MCHC 33.0 30.0 - 36.0 g/dL   RDW  14.5 11.5 - 15.5 %   Platelets 424 (H) 150 - 400 K/uL   nRBC 3.9 (H) 0.0 - 0.2 %  Basic metabolic panel     Status: Abnormal   Collection Time: 09/11/19  5:45 AM  Result Value Ref Range   Sodium 132 (L) 135 - 145 mmol/L   Potassium 3.7 3.5 - 5.1 mmol/L   Chloride 94 (L) 98 - 111 mmol/L   CO2 25 22 - 32 mmol/L   Glucose, Bld 114 (H) 70 - 99 mg/dL   BUN 12 6 - 20 mg/dL   Creatinine, Ser 0.60 (L) 0.61 - 1.24 mg/dL   Calcium 9.1 8.9 - 10.3 mg/dL   GFR calc non Af Amer >60 >60 mL/min   GFR calc Af Amer >60 >60 mL/min   Anion gap 13 5 - 15  Glucose, capillary     Status: Abnormal   Collection Time: 09/11/19  7:57 AM  Result Value Ref Range   Glucose-Capillary 116 (H) 70 - 99 mg/dL  Glucose, capillary     Status: Abnormal   Collection Time: 09/11/19 11:59 AM  Result Value Ref Range   Glucose-Capillary 109 (H) 70 - 99 mg/dL    Assessment & Plan: The plan of care  was discussed with the bedside nurse for the day, who is in agreement with this plan and no additional concerns were raised.   Present on Admission: . SDH (subdural hematoma) (HCC)    LOS: 6 days   Additional comments:I reviewed the patient's new clinical lab test results.   and I reviewed the patients new imaging test results.    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 Arnoldo Morale 6/30. Bone flap in abdominal wall Occipital and clival midline fxs extending into R sphenoid with hemosinus and pneumocephalus- maintain c-collar VDRF- extubated 7/5, appears to have strong cough L1/2 R TP fxs- pain control Leukocytosis - monitor Urinary retention - foley out 7/4, on urecholine FEN - replace cortrak, restartTF, NS bolus, recheck hyponatremia at 1500 VTE - SCDs, LMWH ID - attempt expectorated sputum sample and send UA given leukocytosis and elevated temps despite scheduled tylenol Plan -ICU, therapies   Jesusita Oka, MD Trauma & General Surgery Please use [email or web address] to contact on call provider  09/11/2019  *Care during the described time interval was provided by me. I have reviewed this patient's available data, including medical history, events of note, physical examination and test results as part of my evaluation.

## 2019-09-11 NOTE — Procedures (Signed)
Cortrak  Person Inserting Tube:  Deshon Koslowski C, RD Tube Type:  Cortrak - 43 inches Tube Location:  Left nare Initial Placement:  Stomach Secured by: Bridle Technique Used to Measure Tube Placement:  Documented cm marking at nare/ corner of mouth Cortrak Secured At:  70 cm    Cortrak Tube Team Note:  Consult received to place a Cortrak feeding tube.   No x-ray is required. RN may begin using tube.   If the tube becomes dislodged please keep the tube and contact the Cortrak team at [email or web address] (password [email or web address]) for replacement.  If after hours and replacement cannot be delayed, place a NG tube and confirm placement with an abdominal x-ray.    Gari Trovato P., RD, LDN, CNSC See AMiON for contact information    

## 2019-09-11 NOTE — Progress Notes (Signed)
Subjective: The patient is alert and attentive.  He is in no apparent distress.  Objective: Vital signs in last 24 hours: Temp:  [97.9 F (36.6 C)-101.4 F (38.6 C)] 101.4 F (38.6 C) (07/06 0400) Pulse Rate:  [45-116] 97 (07/06 0700) Resp:  [16-32] 32 (07/06 0700) BP: (128-149)/(67-101) 131/67 (07/06 0700) SpO2:  [88 %-100 %] 96 % (07/06 0700) FiO2 (%):  [40 %] 40 % (07/05 0803) Weight:  [82.3 kg] 82.3 kg (07/06 0500) Estimated body mass index is 26.03 kg/m as calculated from the following:   Height as of this encounter: 5\' 10"  (1.778 m).   Weight as of this encounter: 82.3 kg.   Intake/Output from previous day: 07/05 0701 - 07/06 0700 In: 2118.3 [I.V.:1128.3; NG/GT:240; IV Piggyback:750] Out: 5900 [Urine:5900] Intake/Output this shift: No intake/output data recorded.  Physical exam the patient is alert and follows commands.  There is a language barrier.  He is moving all 4 extremities, right greater than left.  His right pupil continues to be larger than the left and sluggish.  His craniectomy flap is full but soft.  His wound is healing well.  Lab Results: Recent Labs    09/10/19 0716 09/11/19 0545  WBC 18.5* PENDING  HGB 8.8* 10.3*  HCT 26.2* 31.2*  PLT 332 424*   BMET Recent Labs    09/10/19 0716 09/11/19 0545  NA 136 132*  K 3.6 3.7  CL 100 94*  CO2 25 25  GLUCOSE 166* 114*  BUN 12 12  CREATININE 0.54* 0.60*  CALCIUM 8.6* 9.1    Studies/Results: No results found.  Assessment/Plan: Postop day #6: The patient is doing well.  It looks like he will need rehab.  He seems stable for transfer to [facility].  LOS: 6 days     Ophelia Charter 09/11/2019, 7:31 AM

## 2019-09-11 NOTE — Progress Notes (Signed)
SLP Cancellation Note  Patient Details Name: Jason Skinner MRN: [id number] DOB: 01/29/1991   Cancelled treatment:       Reason Eval/Treat Not Completed: Patient at procedure or test/unavailable. Initiated eval, pt alert and F/C in spanish, but then needed RN care and session will need to be attempted tomorrow.    Nolyn Swab, Katherene Ponto 09/11/2019, 12:46 PM

## 2019-09-12 LAB — CBC
HCT: 33.9 % — ABNORMAL LOW (ref 39.0–52.0)
Hemoglobin: 11.3 g/dL — ABNORMAL LOW (ref 13.0–17.0)
MCH: 31.1 pg (ref 26.0–34.0)
MCHC: 33.3 g/dL (ref 30.0–36.0)
MCV: 93.4 fL (ref 80.0–100.0)
Platelets: 513 10*3/uL — ABNORMAL HIGH (ref 150–400)
RBC: 3.63 MIL/uL — ABNORMAL LOW (ref 4.22–5.81)
RDW: 14.6 % (ref 11.5–15.5)
WBC: 16.2 10*3/uL — ABNORMAL HIGH (ref 4.0–10.5)
nRBC: 2 % — ABNORMAL HIGH (ref 0.0–0.2)

## 2019-09-12 LAB — BASIC METABOLIC PANEL
Anion gap: 11 (ref 5–15)
BUN: 13 mg/dL (ref 6–20)
CO2: 23 mmol/L (ref 22–32)
Calcium: 8.7 mg/dL — ABNORMAL LOW (ref 8.9–10.3)
Chloride: 97 mmol/L — ABNORMAL LOW (ref 98–111)
Creatinine, Ser: 0.49 mg/dL — ABNORMAL LOW (ref 0.61–1.24)
GFR calc Af Amer: >60 mL/min (ref >60)
GFR calc non Af Amer: >60 mL/min (ref >60)
Glucose, Bld: 119 mg/dL — ABNORMAL HIGH (ref 70–99)
Potassium: 3.6 mmol/L (ref 3.5–5.1)
Sodium: 131 mmol/L — ABNORMAL LOW (ref 135–145)

## 2019-09-12 LAB — PHOSPHORUS: Phosphorus: 3.6 mg/dL (ref 2.5–4.6)

## 2019-09-12 LAB — GLUCOSE, CAPILLARY
Glucose-Capillary: 116 mg/dL — ABNORMAL HIGH (ref 70–99)
Glucose-Capillary: 121 mg/dL — ABNORMAL HIGH (ref 70–99)
Glucose-Capillary: 133 mg/dL — ABNORMAL HIGH (ref 70–99)
Glucose-Capillary: 130 mg/dL — ABNORMAL HIGH (ref 70–99)
Glucose-Capillary: 132 mg/dL — ABNORMAL HIGH (ref 70–99)
Glucose-Capillary: 134 mg/dL — ABNORMAL HIGH (ref 70–99)

## 2019-09-12 LAB — MAGNESIUM: Magnesium: 2.4 mg/dL (ref 1.7–2.4)

## 2019-09-12 MED ORDER — PIVOT 1.5 CAL PO LIQD
1000.0000 mL | ORAL | Status: DC
  Administered 2019-09-12: 1000 mL
  Filled 2019-09-12 (×5): qty 1000

## 2019-09-12 NOTE — Evaluation (Signed)
Physical Therapy Evaluation Patient Details Name: Jason Skinner MRN: [id number] DOB: 29-Aug-1990 Today's Date: 09/12/2019   History of Present Illness  30yo male who was brought into [facility] earlier today as a level 2 trauma after falling about 20 feet from a ladder on construction site. Pt is found to have B SDH R>L with midline shift and uncal herniation, L temporal contusion and SAH, Occipital and clivial midline fxs extending into R sphenoid with hemosinus and pneumocephalus, respiratory failure, atelectasis, L1-2 TP fxs. Pt was intubated on 6/30. Pt underwent R frontotemporoparietal craniectomy for evacuation of acute subdural hematoma; insertion of right frontal ventriculostomy; insertion of craniotomy flap into the abdominal subcutaneous tissue. Pt extubatyed on 7/5.  Clinical Impression  Pt presents to PT with deficits in functional mobility ,gait, balance, endurance, strength, power, cognition, communication, coordination. Pt with significant proximal RUE weakness as well as RLE weakness although RLE at least 4/5. Pt with impaired coordination of RLE during transfer attempts and with R lateral lean greatly affecting balance and placing the pt at a high risk for falls. Pt follows commands consistently during session but does demonstrate brief periods where he stops responding verbally to questions during session. Pt will benefit from continued acute PT services and aggressive mobilization to improve mobility quality and to reduce falls risk. PT recommends CIR at this time as the pt was independent prior to admission and demonstrates the potential to make significant functional gains with high intensity inpatient therapies.    Follow Up Recommendations CIR    Equipment Recommendations  Wheelchair (measurements PT);Wheelchair cushion (measurements PT);Hospital bed (mechanical lift, if home today)    Recommendations for Other Services       Precautions / Restrictions Precautions Precautions:  Fall Precaution Comments: bone flap in abdomen, helmet when OOB Required Braces or Orthoses: Cervical Brace;Other Brace Cervical Brace: Hard collar;At all times Restrictions Weight Bearing Restrictions: No      Mobility  Bed Mobility Overal bed mobility: Needs Assistance Bed Mobility: Sit to Supine       Sit to supine: Mod assist      Transfers Overall transfer level: Needs assistance Equipment used: 1 person hand held assist Transfers: Sit to/from [employer] Transfers Sit to Stand: Mod assist Stand pivot transfers: Max assist       General transfer comment: pt with some weakness and ataxic movement of RLE resulting in significant instability durign transfer  Ambulation/Gait                Stairs            Wheelchair Mobility    Modified Rankin (Stroke Patients Only) Modified Rankin (Stroke Patients Only) Pre-Morbid Rankin Score: No symptoms Modified Rankin: Severe disability     Balance Overall balance assessment: Needs assistance Sitting-balance support: Bilateral upper extremity supported;Feet supported Sitting balance-Leahy Scale: Poor Sitting balance - Comments: min-modA, left lateral lean   Standing balance support: Bilateral upper extremity supported Standing balance-Leahy Scale: Zero Standing balance comment: maxA with brief periods of modA required to maintain standing due to R lateral lean                             Pertinent Vitals/Pain Pain Assessment: Faces Faces Pain Scale: Hurts even more Pain Location: back Pain Descriptors / Indicators: Grimacing Pain Intervention(s): Monitored during session    Home Living Family/patient expects to be discharged to:: Private residence Living Arrangements: Non-relatives/Friends Available Help at Discharge: Friend(s) Type of Home:  Mobile home Home Access: Stairs to enter Entrance Stairs-Rails: Chemical engineer of Steps: 4-5 Home Layout: One  level Home Equipment: None Additional Comments: 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 Brother is here.     Prior Function Level of Independence: Independent         Comments: wife and 2 daughters in [place]     Hand Dominance   Dominant Hand: Right    Extremity/Trunk Assessment   Upper Extremity Assessment Upper Extremity Assessment: RUE deficits/detail RUE Deficits / Details: 4-/5 elbow flexion/extension and grip, flickers of shoulder activation against gravity into flexion LUE Deficits / Details: WFL    Lower Extremity Assessment Lower Extremity Assessment: RLE deficits/detail RLE Deficits / Details: generalized weakness, ataxia with stepping    Cervical / Trunk Assessment Cervical / Trunk Assessment: Other exceptions Cervical / Trunk Exceptions: hard collar  Communication   Communication: Prefers language other than English (spanish)  Cognition Arousal/Alertness: Awake/alert Behavior During Therapy: Flat affect Overall Cognitive Status: Impaired/Different from baseline Area of Impairment: Attention;Memory;Following commands;Safety/judgement;Awareness;Problem solving               Rancho Levels of Cognitive Functioning Rancho Los Amigos Scales of Cognitive Functioning: Confused/inappropriate/non-agitated   Current Attention Level: Focused Memory: Decreased recall of precautions;Decreased short-term memory Following Commands: Follows one step commands consistently Safety/Judgement: Decreased awareness of safety;Decreased awareness of deficits Awareness: Intellectual          General Comments General comments (skin integrity, edema, etc.): VSS on RA, brother present for session, facetiming wife. Rob Bunting, [place] speaking interpreter, utilized for session    Exercises      Assessment/Plan    PT Assessment Patient needs continued PT services  PT Problem List Decreased strength;Decreased activity tolerance;Decreased balance;Decreased mobility;Decreased coordination;Decreased cognition;Decreased knowledge of use of DME;Decreased safety awareness;Decreased knowledge of precautions;Pain       PT Treatment Interventions DME instruction;Gait training;Stair training;Functional mobility training;Therapeutic activities;Therapeutic exercise;Balance training;Neuromuscular re-education;Cognitive remediation;Patient/family education    PT Goals (Current goals can be found in the Care Plan section)  Acute Rehab PT Goals Patient Stated Goal: To improve mobility PT Goal Formulation: With patient/family Time For Goal Achievement: 09/26/19 Potential to Achieve Goals: Good    Frequency Min 5X/week   Barriers to discharge        Co-evaluation   Reason for Co-Treatment: To address functional/ADL transfers;Necessary to address cognition/behavior during functional activity;Complexity of the patient's impairments (multi-system involvement)     SLP goals addressed during session: Swallowing;Cognition;Communication     AM-PAC PT "6 Clicks" Mobility  Outcome Measure Help needed turning from your back to your side while in a flat bed without using bedrails?: A Lot Help needed moving from lying on your back to sitting on the side of a flat bed without using bedrails?: A Lot Help needed moving to and from a bed to a chair (including a wheelchair)?: Total Help needed standing up from a chair using your arms (e.g., wheelchair or bedside chair)?: A Lot Help needed to walk in hospital room?: Total Help needed climbing 3-5 steps with a railing? : Total 6 Click Score: 9    End of Session   Activity Tolerance: Patient tolerated treatment well Patient left: in bed;with call bell/phone within reach;with bed alarm set Nurse Communication: Mobility status PT Visit Diagnosis:  Unsteadiness on feet (R26.81);Other abnormalities of gait and mobility (R26.89);Muscle weakness (generalized) (M62.81);Ataxic gait (R26.0);Other symptoms and signs involving the nervous system (R29.898)    Time: 4935-5217 PT Time Calculation (min) (ACUTE ONLY): 19 min   Charges:   PT Evaluation $PT Eval Moderate Complexity: 1 Mod          Zenaida Niece, PT, DPT Acute Rehabilitation Pager: [phone]   Zenaida Niece 09/12/2019, 1:14 PM

## 2019-09-12 NOTE — Progress Notes (Signed)
Orthopedic Tech Progress Note Patient Details:  Jason Skinner 1990/12/06 [id number] Called in order to [employer] for a HELMET  Patient ID: Jason Skinner, male   DOB: September 03, 1990, 29 y.o.   MRN: [id number]   Janit Pagan 09/12/2019, 9:23 AM

## 2019-09-12 NOTE — Progress Notes (Addendum)
Report given to [employer].   4:36 PM Patient transferred via bed to unit at this time without complications. All belongings gathered and brought with patient. Brother at bedside.   Vitals:   09/12/19 1600 09/12/19 1635  BP: 124/84 130/81  Pulse: 78 91  Resp: 16 20  Temp: 100.2 F (37.9 C) 99.1 F (37.3 C)  SpO2: 95% 95%

## 2019-09-12 NOTE — Progress Notes (Signed)
° °  Providing Compassionate, Quality Care - Together   Subjective: Nurse reports no issues overnight. Patient's brother is at the bedside. RN, Margaretha Sheffield, serving as Optometrist during exam.  Objective: Vital signs in last 24 hours: Temp:  [97.9 F (36.6 C)-99.6 F (37.6 C)] 97.9 F (36.6 C) (07/07 1144) Pulse Rate:  [64-102] 72 (07/07 0700) Resp:  [16-28] 21 (07/07 0900) BP: (114-134)/(60-89) 114/68 (07/07 1100) SpO2:  [93 %-100 %] 98 % (07/07 1100)  Intake/Output from previous day: 07/06 0701 - 07/07 0700 In: 2434.2 [I.V.:817.9; ZO/XW:9604.5; IV Piggyback:250] Out: 2700 [Urine:2700] Intake/Output this shift: No intake/output data recorded.  Somnolent, arouses to voice Oriented to self and place Speech clear, but delayed responses Pupils round, reactive, Left pupil 4 mm, Right pupil 5 mm, gaze dysconjugate Ptosis of the right eyelid MAE, Following commands, Strength appears equal bilaterally during today's exam Right frontotemporoparietal crani incision closed with staples; surgical wound is clean, dry, and intact Right-sided head and periorbital edema improving Right abdominal incision with Steri Strips Dressing is clean, dry, and intact   Lab Results: Recent Labs    09/11/19 0545 09/12/19 0959  WBC 19.3* 16.2*  HGB 10.3* 11.3*  HCT 31.2* 33.9*  PLT 424* 513*   BMET Recent Labs    09/11/19 1554 09/12/19 0959  NA 131* 131*  K 3.6 3.6  CL 97* 97*  CO2 24 23  GLUCOSE 120* 119*  BUN 14 13  CREATININE 0.66 0.49*  CALCIUM 8.6* 8.7*    Studies/Results: DG Chest Port 1 View  Result Date: 09/11/2019 CLINICAL DATA:  Respiratory failure, fall, head injury EXAM: PORTABLE CHEST 1 VIEW COMPARISON:  09/06/2019 FINDINGS: The heart size and mediastinal contours are within normal limits. Subtle heterogeneous airspace opacities of the lung bases. The visualized skeletal structures are unremarkable. IMPRESSION: Subtle heterogeneous airspace opacities of the lung bases,  suspicious for infection or aspiration. Electronically Signed   By: Eddie Candle M.D.   On: 09/11/2019 10:57    Assessment/Plan: Patient is 7 days status post craniectomy for subdural evacuation.   LOS: 7 days    -Patient will transfer to [facility] once bed becomes available -Patient to work with PT and OT; SLP has evaluated patient and is anticipating CIR at discharge -No new Neurosurgical recommendations at this time   Viona Gilmore, [place], AGNP-C Nurse Practitioner  [facility]. [address], [address], [place], [place] [address] P: [phone]     F: [phone]  09/12/2019, 12:34 PM

## 2019-09-12 NOTE — Progress Notes (Signed)
Inpatient Rehab Admissions Coordinator Note:   Per therapy recommendations, pt was screened for CIR candidacy by Shann Medal, PT, DPT.  At this time we are recommending a CIR consult and I will place an order per our protocol.  Please contact me with questions.   Shann Medal, PT, DPT [phone] 09/12/19 5:09 PM

## 2019-09-12 NOTE — Evaluation (Signed)
Speech Language Pathology Evaluation Patient Details Name: Jason Skinner MRN: [id number] DOB: 01-Jul-1990 Today's Date: 09/12/2019    Problem List:  Patient Active Problem List   Diagnosis Date Noted  . SDH (subdural hematoma) ([facility]) 09/05/2019   HPI:  Jason Skinner is a 29yo male who was brought into [facility] 6/30 as a level 2 trauma after falling about 20 feet from a ladder on construction site.  Per report +LOC after fall, then patient was alert and oriented. He decompensated with EMS and was upgraded to a level 1, intubated en route. Found to have Right subdural hematoma, occipital skull fracture, clivus fracture, L1/2 R tranverse process fx, traumatic brain injury. Underwent  Right frontotemporoparietal craniectomy for evacuation of acute subdural hematoma; insertion of right frontal ventriculostomy; insertion of craniotomy flap into the abdominal subcutaneous tissue. Extubated 7/5.    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 Behaviors most consistent with a Rancho V (Confused, inappropriate, nonagitated) though if more active may have potential to demonstrate some lower level behaviors per observation. Pt is not agitated or restless, but he also has minimal initiation of any interaction (verbal or motor). He will consistently follow 1-2 step commands and carry out automatic purposeful tasks with some verbal and tactile cueing to initiate (face washing, oral care, self feeding). Pt responds to questions in short, linguistically appropriate, intelligible, low volume phrases, but does not ask questions or make requests. He is disortiented to place, situation and time and even with multiple repetitions cannot demonstrate short term recall. His only independent initiation of activity are a few socially inappropriate behaviors without awareness to correct even after cueing (trying to feed therapist, rubbing therapist's leg  with his foot, touching therapist's hair). Discussed pts progress with his brother and wife (via facetime) and recommendations for future therapy. Will f/u 3x a week in acute setting, recommend CIR at d/c.     SLP Assessment  SLP Recommendation/Assessment: Patient needs continued Speech Lanaguage Pathology Services SLP Visit Diagnosis: Cognitive communication deficit (R41.841)    Follow Up Recommendations  Inpatient Rehab    Frequency and Duration min 3x week  2 weeks      SLP Evaluation Cognition  Overall Cognitive Status: Impaired/Different from baseline Arousal/Alertness: Awake/alert Orientation Level: Oriented to person;Disoriented to time;Disoriented to place;Disoriented to situation Attention: Focused;Sustained Focused Attention: Appears intact Sustained Attention: Appears intact Memory: Impaired Memory Impairment: Storage deficit;Decreased short term memory;Decreased recall of new information Decreased Short Term Memory: Verbal basic Awareness: Impaired Awareness Impairment: Intellectual impairment;Emergent impairment Problem Solving: Impaired Problem Solving Impairment: Verbal basic Executive Function: Reasoning;Initiating;Self Monitoring Reasoning: Impaired Reasoning Impairment: Verbal basic Initiating: Impaired Initiating Impairment: Verbal basic;Functional basic Self Monitoring: Impaired Self Monitoring Impairment: Verbal basic;Functional basic Rancho [employer] Scales of Cognitive Functioning: Confused/inappropriate/non-agitated (some rancho IV behaviors as well)       Comprehension  Auditory Comprehension Overall Auditory Comprehension: Appears within functional limits for tasks assessed Yes/No Questions: Not tested Commands: Within Functional Limits Conversation: Simple Interfering Components: Attention;Visual impairments EffectiveTechniques: Repetition Reading Comprehension Reading Status: Not tested    Expression Expression Primary Mode of Expression:  Verbal Verbal Expression Overall Verbal Expression: Appears within functional limits for tasks assessed Initiation: Impaired Automatic Speech: Name;Social Response;Counting Level of Generative/Spontaneous Verbalization: Word;Phrase Repetition: No impairment Naming: Not tested Pragmatics: Impairment Impairments: Abnormal affect;Monotone Written Expression Dominant Hand: Right   Oral / Motor  Oral Motor/Sensory Function Overall Oral Motor/Sensory Function: Within functional limits Motor Speech Overall Motor Speech: Appears  within functional limits for tasks assessed   GO                   Herbie Baltimore, MA [place] Pager [phone] Office [phone]  Lynann Beaver 09/12/2019, 10:53 AM

## 2019-09-12 NOTE — Progress Notes (Signed)
Nutrition Follow-up  DOCUMENTATION CODES:   Not applicable  INTERVENTION:   Tube feeding via Cortrak tube (gastric): Increase Pivot 1.5 to 65 ml/h (1560 ml per day)  Provides 2340 kcal, 146 gm protein, 1184 ml free water daily   NUTRITION DIAGNOSIS:   Increased nutrient needs related to  (TBI) as evidenced by estimated needs. Ongoing.   GOAL:   Patient will meet greater than or equal to 90% of their needs Met with TF.   MONITOR:   TF tolerance  REASON FOR ASSESSMENT:   Consult, Ventilator Enteral/tube feeding initiation and management  ASSESSMENT:   Pt with no PMH admitted after 20 foot fall at a construction site with bil SDH R>L with midline shift and uncal herniation, L temporal contusion and SAH s/p decompressive R craniectomy, occipital and clival midline fxs, and R elbow deformity.    Pt discussed during ICU rounds and with RN.  Pt working with therapies.   7/5 extubated 7/6 Cortrak placed; tip gastric   Medications reviewed and include: vitamin C 1000 mg every 8 hours, selenium 200 mcg daily  Labs reviewed   Current TF: Pivot 1.5 @ 60 Provides: 2160 kcal and 135 grams protein  Diet Order:   Diet Order            Diet NPO time specified  Diet effective now                 EDUCATION NEEDS:   No education needs have been identified at this time  Skin:  Skin Assessment: Reviewed RN Assessment  Last BM:  7/7  Height:   Ht Readings from Last 1 Encounters:  09/05/19 '5\' 10"'  (1.778 m)    Weight:   Wt Readings from Last 1 Encounters:  09/11/19 82.3 kg    Ideal Body Weight:  75.4 kg  BMI:  Body mass index is 26.03 kg/m.  Estimated Nutritional Needs:   Kcal:  2300-2500  Protein:  120-145 grams  Fluid:  >2 L/day  Lockie Pares., RD, LDN, CNSC See AMiON for contact information '

## 2019-09-12 NOTE — Progress Notes (Signed)
Trauma/Critical Care Follow Up Note  Subjective:    Overnight Issues:   Objective:  Vital signs for last 24 hours: Temp:  [98 F (36.7 C)-99.6 F (37.6 C)] 98.5 F (36.9 C) (07/07 0812) Pulse Rate:  [64-102] 72 (07/07 0700) Resp:  [16-28] 24 (07/07 0500) BP: (117-138)/(60-92) 117/85 (07/07 0700) SpO2:  [93 %-100 %] 97 % (07/07 0700)  Hemodynamic parameters for last 24 hours:    Intake/Output from previous day: 07/06 0701 - 07/07 0700 In: 2434.2 [I.V.:817.9; XT/GG:2694.8; IV Piggyback:250] Out: 2700 [Urine:2700]  Intake/Output this shift: No intake/output data recorded.  Vent settings for last 24 hours:    Physical Exam:  Gen: comfortable, no distress Neuro: non-focal exam, follow commands briskly HEENT: PERRL Neck: supple CV: RRR Pulm: unlabored breathing on RA Abd: soft, NT GU: clear yellow urine, condom cath Extr: wwp, no edema   Results for orders placed or performed during the hospital encounter of 09/05/19 (from the past 24 hour(s))  Glucose, capillary     Status: Abnormal   Collection Time: 09/11/19 11:59 AM  Result Value Ref Range   Glucose-Capillary 109 (H) 70 - 99 mg/dL  Glucose, capillary     Status: Abnormal   Collection Time: 09/11/19  3:30 PM  Result Value Ref Range   Glucose-Capillary 115 (H) 70 - 99 mg/dL  Basic metabolic panel     Status: Abnormal   Collection Time: 09/11/19  3:54 PM  Result Value Ref Range   Sodium 131 (L) 135 - 145 mmol/L   Potassium 3.6 3.5 - 5.1 mmol/L   Chloride 97 (L) 98 - 111 mmol/L   CO2 24 22 - 32 mmol/L   Glucose, Bld 120 (H) 70 - 99 mg/dL   BUN 14 6 - 20 mg/dL   Creatinine, Ser 0.66 0.61 - 1.24 mg/dL   Calcium 8.6 (L) 8.9 - 10.3 mg/dL   GFR calc non Af Amer >60 >60 mL/min   GFR calc Af Amer >60 >60 mL/min   Anion gap 10 5 - 15  Urinalysis, Routine w reflex microscopic     Status: Abnormal   Collection Time: 09/11/19  5:24 PM  Result Value Ref Range   Color, Urine YELLOW YELLOW   APPearance HAZY (A)  CLEAR   Specific Gravity, Urine 1.016 1.005 - 1.030   pH 7.0 5.0 - 8.0   Glucose, UA NEGATIVE NEGATIVE mg/dL   Hgb urine dipstick NEGATIVE NEGATIVE   Bilirubin Urine NEGATIVE NEGATIVE   Ketones, ur NEGATIVE NEGATIVE mg/dL   Protein, ur NEGATIVE NEGATIVE mg/dL   Nitrite NEGATIVE NEGATIVE   Leukocytes,Ua NEGATIVE NEGATIVE  Glucose, capillary     Status: Abnormal   Collection Time: 09/11/19  7:50 PM  Result Value Ref Range   Glucose-Capillary 109 (H) 70 - 99 mg/dL  Glucose, capillary     Status: Abnormal   Collection Time: 09/11/19 11:29 PM  Result Value Ref Range   Glucose-Capillary 123 (H) 70 - 99 mg/dL  Glucose, capillary     Status: Abnormal   Collection Time: 09/12/19  3:28 AM  Result Value Ref Range   Glucose-Capillary 116 (H) 70 - 99 mg/dL  Glucose, capillary     Status: Abnormal   Collection Time: 09/12/19  8:10 AM  Result Value Ref Range   Glucose-Capillary 121 (H) 70 - 99 mg/dL    Assessment & Plan: The plan of care was discussed with the bedside nurse for the day, who is in agreement with this plan and no additional concerns were  raised.   Present on Admission: . SDH (subdural hematoma) ([facility])    LOS: 7 days   Additional comments:I reviewed the patient's new clinical lab test results.   and I reviewed the patients new imaging test results.    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 Arnoldo Morale 6/30. Bone flap in abdominal wall. Helmet ordered today. Occipital and clival midline fxs extending into R sphenoid with hemosinus and pneumocephalus- maintain c-collar VDRF- extubated 7/5, appears to have strong cough L1/2 R TP fxs- pain control Leukocytosis - monitor Urinary retention - foley out 7/4, on urecholine FEN - TF, NS bolus, await today's labs VTE - SCDs, LMWH ID - afebrile Plan -SDU, therapies   Jesusita Oka, MD Trauma & General  Surgery Please use [email or web address] to contact on call provider  09/12/2019  *Care during the described time interval was provided by me. I have reviewed this patient's available data, including medical history, events of note, physical examination and test results as part of my evaluation.

## 2019-09-12 NOTE — Evaluation (Signed)
Occupational Therapy Evaluation Patient Details Name: Jason Skinner MRN: [id number] DOB: 06/12/1990 Today's Date: 09/12/2019    History of Present Illness 29yo male who was brought into [facility] earlier today as a level 2 trauma after falling about 20 feet from a ladder on construction site. Pt is found to have B SDH R>L with midline shift and uncal herniation, L temporal contusion and SAH, Occipital and clivial midline fxs extending into R sphenoid with hemosinus and pneumocephalus, respiratory failure, atelectasis, L1-2 TP fxs. Pt was intubated on 6/30. Pt underwent R frontotemporoparietal craniectomy for evacuation of acute subdural hematoma; insertion of right frontal ventriculostomy; insertion of craniotomy flap into the abdominal subcutaneous tissue. Pt extubated on 7/5.   Clinical Impression   Patient is s/p craniotomy surgery resulting in functional limitations due to the deficits listed below (see OT problem list). Pt currently with bone flap in abdomen pending helmet for transfers. Pt requires max (A) for basic transfer sit<.stand. Pt demonstrate behavior consistent with a Rancho coma recovery level V.  Patient will benefit from skilled OT acutely to increase independence and safety with ADLS to allow discharge CIR. Pt will have (A) from brother upon d/c from [facility].      Follow Up Recommendations  CIR    Equipment Recommendations  Other (comment) (TBA)    Recommendations for Other Services Rehab consult     Precautions / Restrictions Precautions Precautions: Fall Precaution Comments: bone flap in abdomen, helmet when OOB Required Braces or Orthoses: Cervical Brace;Other Brace Cervical Brace: Hard collar;At all times Restrictions Weight Bearing Restrictions: No      Mobility Bed Mobility Overal bed mobility: Needs Assistance Bed Mobility: Sit to Supine       Sit to supine: Mod assist;+2 for physical assistance   General bed mobility comments: Pt requires tactile cues to roll  toward R side and physical (A) to push up from bed surface. pt requires total (A) of therapist to help ensure back precautions and R skull flap protection  Transfers Overall transfer level: Needs assistance Equipment used: 1 person hand held assist Transfers: Sit to/from [facility] Sit to Stand: Mod assist Stand pivot transfers: Max assist       General transfer comment: Pt with weakness noted with R LE. pt stepping with L LE toward chair. pt with therapist placing hand on chair to gain visual attention to help with initiation of transfer. pt with no control to sit in chair surface    Balance Overall balance assessment: Needs assistance Sitting-balance support: Bilateral upper extremity supported;Feet supported Sitting balance-Leahy Scale: Poor Sitting balance - Comments: min-modA, left lateral lean   Standing balance support: Bilateral upper extremity supported Standing balance-Leahy Scale: Zero Standing balance comment: maxA with brief periods of modA required to maintain standing due to R lateral lean pt with a posterior lean bias                           ADL either performed or assessed with clinical judgement   ADL Overall ADL's : Needs assistance/impaired Eating/Feeding: Moderate assistance;Sitting Eating/Feeding Details (indicate cue type and reason): pt undershooting when self feeding. pt using L hand instead of right initially.  Grooming: Dance movement psychotherapist;Moderate assistance;Sitting Grooming Details (indicate cue type and reason): asked to wash face and reaching with L hand to wash face Upper Body Bathing: Maximal assistance   Lower Body Bathing: Total assistance   Upper Body Dressing : Maximal assistance   Lower Body Dressing: Total  assistance   Toilet Transfer: Moderate assistance;Stand-pivot Toilet Transfer Details (indicate cue type and reason): simulated bed to chair  Toileting- Clothing Manipulation and Hygiene: Total assistance          General ADL Comments: pt transfered from bed to chair this session. pt noted to have dysconjugate gaze     Vision Baseline Vision/History: No visual deficits Vision Assessment?: Yes Eye Alignment: Impaired (comment) Ocular Range of Motion: Impaired-to be further tested in functional context Additional Comments: pt with R eye closed with physical (A) required to open eye. pt with very dysconjugate gaze when eyes open. pt with R eye occluded able to correct respond and track in all fields. pt with L eye occluded responding and reporting accurately in all visual fields. Question if pt has diplopia with both eyes open but at this time denies     Perception     Praxis      Pertinent Vitals/Pain Pain Assessment: No/denies pain Faces Pain Scale: Hurts even more Pain Location: back Pain Descriptors / Indicators: Grimacing Pain Intervention(s): Monitored during session     Hand Dominance Right   Extremity/Trunk Assessment Upper Extremity Assessment Upper Extremity Assessment: RUE deficits/detail RUE Deficits / Details: 4-/5 elbow flexion/extension and grip, flickers of shoulder activation against gravity into flexion attempting to self feed. pt unable to sustain R UE against gravity with decrease awareness to R side. Pt reports sensation changes only in upper arm. pt reports elbow down toward hand [facility] for sensation. pt reports sensation at shoulder joint.  LUE Deficits / Details: WFL   Lower Extremity Assessment Lower Extremity Assessment: Defer to PT evaluation RLE Deficits / Details: generalized weakness, ataxia with stepping   Cervical / Trunk Assessment Cervical / Trunk Assessment: Other exceptions Cervical / Trunk Exceptions: hard collar   Communication Communication Communication: Prefers language other than English (spanish)   Cognition Arousal/Alertness: Awake/alert Behavior During Therapy: Flat affect Overall Cognitive Status: Impaired/Different from baseline Area  of Impairment: Attention;Memory;Following commands;Safety/judgement;Awareness;Problem solving               Rancho Levels of Cognitive Functioning Rancho [place] Amigos Scales of Cognitive Functioning: Confused/inappropriate/non-agitated   Current Attention Level: Focused Memory: Decreased recall of precautions;Decreased short-term memory Following Commands: Follows multi-step commands with increased time Safety/Judgement: Decreased awareness of safety;Decreased awareness of deficits Awareness: Intellectual Problem Solving: Slow processing;Difficulty sequencing General Comments: pt attempting to feed therapist when cued to self feed. pt attempting to touch SLP hair and touching her foot with his foot. Pt told this was not appropriate but continued even after being asked to stop. pt able to state wife and children names. pt recognized brother in room. Pt unaware of location as hospital even after being told will report "at a friends house". pt unable to retain location as hospital   General Comments  VSS BP checked supine sitting and then in chair. pt falling asleep once up in the chair. Brother reports that pt is able to utilize the green communication device for spanish without issues but agrees inperson translation will be easier and better.     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 Brother is here.   Lives With: Friend(s)    Prior Functioning/Environment Level of Independence: Independent        Comments: wife and 2 daughters in [place]        OT Problem List: Decreased strength;Decreased activity tolerance;Impaired balance (sitting and/or standing);Decreased coordination;Decreased cognition;Decreased safety awareness;Decreased knowledge of use of DME or AE;Decreased knowledge of precautions;Pain      OT Treatment/Interventions: Self-care/ADL training;Therapeutic exercise;Neuromuscular education;DME and/or AE instruction;Manual therapy;Modalities;Therapeutic activities;Cognitive remediation/compensation;Visual/perceptual remediation/compensation;Patient/family education;Balance training    OT Goals(Current goals can be found in the care plan section) Acute Rehab OT Goals Patient Stated Goal: none stated.  OT Goal Formulation: Patient unable to participate in goal setting Time For Goal Achievement: 09/26/19 Potential to Achieve Goals: Good  OT Frequency: Min 3X/week   Barriers to D/C:            Co-evaluation PT/OT/SLP Co-Evaluation/Treatment: Yes     OT goals addressed during session: ADL's and self-care;Strengthening/ROM;Proper use of Adaptive equipment and DME      AM-PAC OT "6 Clicks" Daily Activity     Outcome Measure Help from another person eating meals?: A Lot Help from another person taking care of personal grooming?: A Lot Help from another person toileting, which includes using toliet, bedpan, or urinal?: Total Help from another person bathing (including washing, rinsing, drying)?: Total Help from another person to put on and taking off regular upper body clothing?: A Lot Help from another person to put on and taking off regular lower body clothing?: Total 6 Click Score: 9   End of Session Equipment Utilized During Treatment: Gait belt Nurse Communication: Mobility status;Precautions  Activity  Tolerance: Patient tolerated treatment well Patient left: in chair;with call bell/phone within reach;with chair alarm set;with family/visitor present  OT Visit Diagnosis: Unsteadiness on feet (R26.81);Muscle weakness (generalized) (M62.81)                Time: 0383-3383 OT Time Calculation (min): 41 min Charges:  OT General Charges $OT Visit: 1 Visit OT Evaluation $OT Eval Moderate Complexity: 1 Mod OT Treatments $Self Care/Home Management : 8-22 mins   Brynn, OTR/L  Acute Rehabilitation Services Pager: [phone] Office: [phone] .   Jeri Modena 09/12/2019, 3:41 PM

## 2019-09-13 DIAGNOSIS — S065X9A Traumatic subdural hemorrhage with loss of consciousness of unspecified duration, initial encounter: Principal | ICD-10-CM

## 2019-09-13 LAB — BASIC METABOLIC PANEL
Anion gap: 15 (ref 5–15)
BUN: 13 mg/dL (ref 6–20)
CO2: 19 mmol/L — ABNORMAL LOW (ref 22–32)
Calcium: 8.8 mg/dL — ABNORMAL LOW (ref 8.9–10.3)
Chloride: 98 mmol/L (ref 98–111)
Creatinine, Ser: 0.5 mg/dL — ABNORMAL LOW (ref 0.61–1.24)
GFR calc Af Amer: >60 mL/min (ref >60)
GFR calc non Af Amer: >60 mL/min (ref >60)
Glucose, Bld: 99 mg/dL (ref 70–99)
Potassium: 4.1 mmol/L (ref 3.5–5.1)
Sodium: 132 mmol/L — ABNORMAL LOW (ref 135–145)

## 2019-09-13 LAB — MAGNESIUM: Magnesium: 2.1 mg/dL (ref 1.7–2.4)

## 2019-09-13 LAB — GLUCOSE, CAPILLARY
Glucose-Capillary: 102 mg/dL — ABNORMAL HIGH (ref 70–99)
Glucose-Capillary: 114 mg/dL — ABNORMAL HIGH (ref 70–99)
Glucose-Capillary: 125 mg/dL — ABNORMAL HIGH (ref 70–99)
Glucose-Capillary: 119 mg/dL — ABNORMAL HIGH (ref 70–99)

## 2019-09-13 LAB — CBC
HCT: 38.8 % — ABNORMAL LOW (ref 39.0–52.0)
Hemoglobin: 12.8 g/dL — ABNORMAL LOW (ref 13.0–17.0)
MCH: 30.9 pg (ref 26.0–34.0)
MCHC: 33 g/dL (ref 30.0–36.0)
MCV: 93.7 fL (ref 80.0–100.0)
Platelets: 501 10*3/uL — ABNORMAL HIGH (ref 150–400)
RBC: 4.14 MIL/uL — ABNORMAL LOW (ref 4.22–5.81)
RDW: 14.8 % (ref 11.5–15.5)
WBC: 15.6 10*3/uL — ABNORMAL HIGH (ref 4.0–10.5)
nRBC: 0.8 % — ABNORMAL HIGH (ref 0.0–0.2)

## 2019-09-13 LAB — PATHOLOGIST SMEAR REVIEW

## 2019-09-13 LAB — PHOSPHORUS: Phosphorus: 3.1 mg/dL (ref 2.5–4.6)

## 2019-09-13 MED ORDER — MORPHINE SULFATE (PF) 2 MG/ML IV SOLN
2.0000 mg | INTRAVENOUS | Status: DC | PRN
  Filled 2019-09-13: qty 1

## 2019-09-13 MED ORDER — METHOCARBAMOL 500 MG PO TABS
500.0000 mg | ORAL_TABLET | Freq: Three times a day (TID) | ORAL | Status: DC
  Administered 2019-09-13 – 2019-09-17 (×13): 500 mg
  Filled 2019-09-13 (×13): qty 1

## 2019-09-13 NOTE — Progress Notes (Signed)
[facility] Progress Note  8 Days Post-Op  Subjective: CC-  Brother at bedside. Patient just finished working with therapies and is very tired. Not doing much for me. Per report he has been following simple commands, Rancho V. Pulled cortrak out last night  Objective: Vital signs in last 24 hours: Temp:  [97.9 F (36.6 C)-100.4 F (38 C)] 100.4 F (38 C) (07/08 0750) Pulse Rate:  [70-98] 98 (07/08 0750) Resp:  [16-22] 16 (07/08 0750) BP: (107-130)/(69-84) 107/74 (07/08 0750) SpO2:  [95 %-98 %] 95 % (07/08 0750) Last BM Date: 09/12/19  Intake/Output from previous day: 07/07 0701 - 07/08 0700 In: 56.3 [NG/GT:56.3] Out: 1300 [Urine:1300] Intake/Output this shift: No intake/output data recorded.  PE: Gen:  Alert but lethargic, NAD HEENT: R pupil slightly larger than L, reactive to light Card:  Mild tachy, 2+ DP pulses Pulm:  CTAB, no W/R/R, rate and effort normal on room air Abd: Soft, NT/ND, +BS Ext:  no BUE/BLE edema, calves soft and nontender Skin: warm and dry  Lab Results:  Recent Labs    09/12/19 0959 09/13/19 0433  WBC 16.2* 15.6*  HGB 11.3* 12.8*  HCT 33.9* 38.8*  PLT 513* 501*   BMET Recent Labs    09/12/19 0959 09/13/19 0433  NA 131* 132*  K 3.6 4.1  CL 97* 98  CO2 23 19*  GLUCOSE 119* 99  BUN 13 13  CREATININE 0.49* 0.50*  CALCIUM 8.7* 8.8*   PT/INR No results for input(s): LABPROT, INR in the last 72 hours. CMP     Component Value Date/Time   NA 132 (L) 09/13/2019 0433   K 4.1 09/13/2019 0433   CL 98 09/13/2019 0433   CO2 19 (L) 09/13/2019 0433   GLUCOSE 99 09/13/2019 0433   BUN 13 09/13/2019 0433   CREATININE 0.50 (L) 09/13/2019 0433   CALCIUM 8.8 (L) 09/13/2019 0433   PROT 5.5 (L) 09/06/2019 0500   ALBUMIN 3.1 (L) 09/06/2019 0500   AST 25 09/06/2019 0500   ALT 16 09/06/2019 0500   ALKPHOS 40 09/06/2019 0500   BILITOT 2.2 (H) 09/06/2019 0500   GFRNONAA >60 09/13/2019 0433   GFRAA >60 09/13/2019 0433   Lipase  No  results found for: LIPASE     Studies/Results: No results found.  Anti-infectives: Anti-infectives (From admission, onward)   Start     Dose/Rate Route Frequency Ordered Stop   09/05/19 1250  bacitracin 50,000 Units in sodium chloride 0.9 % 500 mL irrigation  Status:  Discontinued          As needed 09/05/19 1250 09/05/19 1414       Assessment/Plan Fall  B SDH R>L with midline shift and uncal herniation, L temporal contusion and SAH-s/pdecompressive R craniectomy and placement L EVD by Dr. Arnoldo Morale 6/30. Bone flap in abdominal wall. Completed 7 days keppra. Helmet ordered. Will eventually need replacement craniectomy flap Occipital and clival midline fxs extending into R sphenoid with hemosinus and pneumocephalus- maintain c-collar VDRF- extubated 7/5, O2 sats good on RA, continues to have strong cough L1/2 R TP fxs- pain control Leukocytosis - downtrending, TMAX 100.2, monitor Urinary retention - foley out 7/4, on urecholine and has good UOP FEN - NPO, Cortrak out and TF on hold VTE - SCDs,LMWH ID - none Plan - Continue therapies. CIR following. Cortrak out, will ask team to replace tomorrow since they're not here today; hold TF until then.   LOS: 8 days    Wellington Hampshire, [facility] 09/13/2019, 11:39 AM Please see Amion for pager number during day hours 7:00am-4:30pm

## 2019-09-13 NOTE — Progress Notes (Signed)
Occupational Therapy Treatment Patient Details Name: Jason Skinner MRN: [id number] DOB: 03/21/1990 Today's Date: 09/13/2019    History of present illness 29yo male who was brought into [facility] earlier today as a level 2 trauma after falling about 20 feet from a ladder on construction site. Pt is found to have B SDH R>L with midline shift and uncal herniation, L temporal contusion and SAH, Occipital and clivial midline fxs extending into R sphenoid with hemosinus and pneumocephalus, respiratory failure, atelectasis, L1-2 TP fxs. Pt was intubated on 6/30. Pt underwent R frontotemporoparietal craniectomy for evacuation of acute subdural hematoma; insertion of right frontal ventriculostomy; insertion of craniotomy flap into the abdominal subcutaneous tissue. Pt extubated on 7/5.   OT comments  Pt seen in conjunction with PT.  Pt requires max A for bed mobility, and mod A +2 for functional transfers and standing balance.  He follows one step simple motor commands with ~75% accuracy, and was able to assist with peri care with max A.  He demonstrates ideational apraxia, and was noted to perseverate at times.  He demonstrates behaviors consistent with Ranchos V, with some level VI behaviors emerging.  Continue to recommend CIR.  Video interpreter Creta Levin [phone] utilized.    Follow Up Recommendations  CIR    Equipment Recommendations  None recommended by OT    Recommendations for Other Services Rehab consult    Precautions / Restrictions Precautions Precautions: Fall Precaution Comments: bone flap in abdomen, helmet when OOB Required Braces or Orthoses: Cervical Brace;Other Brace Cervical Brace: Hard collar;At all times Other Brace: helmet for OOB        Mobility Bed Mobility Overal bed mobility: Needs Assistance Bed Mobility: Supine to Sit     Supine to sit: Max assist Sit to supine: Mod assist;+2 for physical assistance   General bed mobility comments: Pt required max A to initiate movement to  move to EOB. Once he was on his side, he was able to spontaneously assist with pushing himself up into sitting.    Transfers Overall transfer level: Needs assistance Equipment used: 2 person hand held assist Transfers: Sit to/from [facility] Sit to Stand: Mod assist Stand pivot transfers: Mod assist;+2 physical assistance       General transfer comment: pt requiring initiation of weight shift to perform steps when turning from bed to recliner    Balance Overall balance assessment: Needs assistance Sitting-balance support: Bilateral upper extremity supported;Feet supported Sitting balance-Leahy Scale: Poor Sitting balance - Comments: minA to maintain static sitting balance, sway in all directions throughout session   Standing balance support: Bilateral upper extremity supported Standing balance-Leahy Scale: Poor Standing balance comment: modA to maintain static stading balance with BUE support of PT/OT                           ADL either performed or assessed with clinical judgement   ADL Overall ADL's : Needs assistance/impaired     Grooming: Oral care;Total assistance;Sitting Grooming Details (indicate cue type and reason): Pt unable to identify or demonstrate use of toothbrush despite max cues                  Toilet Transfer: Moderate assistance;+2 for physical assistance;+2 for safety/equipment;Stand-pivot;BSC   Toileting- Clothing Manipulation and Hygiene: Maximal assistance;Sit to/from stand Toileting - Clothing Manipulation Details (indicate cue type and reason): Pt incontinent of urine.  He was able to assist with peri care, but demonstrates decreased thoroughness  Functional mobility during ADLs: Moderate assistance;+2 for physical assistance;+2 for safety/equipment       Vision   Vision Assessment?: Yes Eye Alignment: Impaired (comment) Ocular Range of Motion: Impaired-to be further tested in functional  context Additional Comments: Ptosis present Rt eye.  Lt eye with possible oscillopsia.  He keeps head slightly rotated to the Lt and requires cues to look to the Rt with his eyes.  He undershoots when attempting to target objects    Perception     Praxis      Cognition Arousal/Alertness: Awake/alert Behavior During Therapy: Flat affect Overall Cognitive Status: Impaired/Different from baseline Area of Impairment: Attention;Orientation;Memory;Following commands;Safety/judgement;Awareness;Problem solving;Rancho level               Rancho Levels of Cognitive Functioning Rancho [employer] Scales of Cognitive Functioning: Confused/inappropriate/non-agitated Orientation Level: Disoriented to;Time;Place;Situation Current Attention Level: Focused;Sustained Memory: Decreased short-term memory Following Commands: Follows one step commands with increased time;Follows one step commands inconsistently Safety/Judgement: Decreased awareness of deficits   Problem Solving: Slow processing;Decreased initiation;Difficulty sequencing;Requires verbal cues;Requires tactile cues General Comments: Pt followed one step simple motor commands ~75% the time.  He was unable to follow multi step commands.   He required min - mod cues/assist to initiate movement/activity at times, and was noted to perseverate.  He initially thought he was in a town in [place], but when given choices was able to correctly identify [place] as his location         Exercises     Shoulder Instructions       General Comments pt does report dizziness when asked during all standign activity, limiting his tolerance. Pt does become tachycardic with standing, up to 132 during transfer. PT unable to record standing BP as pt cannot stand for long enough periods for cuff to get a reading. Video chat interpreter utilized during session, pt's brother also assisting some    Pertinent Vitals/ Pain       Pain Assessment: Faces Faces Pain Scale:  Hurts a little bit Pain Location: headache  Pain Descriptors / Indicators: Headache Pain Intervention(s): Monitored during session;Patient requesting pain meds-RN notified  Home Living                                          Prior Functioning/Environment              Frequency  Min 3X/week        Progress Toward Goals  OT Goals(current goals can now be found in the care plan section)  Progress towards OT goals: Progressing toward goals     Plan Discharge plan remains appropriate    Co-evaluation    PT/OT/SLP Co-Evaluation/Treatment: Yes Reason for Co-Treatment: Complexity of the patient's impairments (multi-system involvement);To address functional/ADL transfers;Necessary to address cognition/behavior during functional activity;For patient/therapist safety   OT goals addressed during session: ADL's and self-care;Strengthening/ROM      AM-PAC OT "6 Clicks" Daily Activity     Outcome Measure   Help from another person eating meals?: A Lot Help from another person taking care of personal grooming?: A Lot Help from another person toileting, which includes using toliet, bedpan, or urinal?: A Lot Help from another person bathing (including washing, rinsing, drying)?: A Lot Help from another person to put on and taking off regular upper body clothing?: A Lot Help from another person to put on and taking off regular lower  body clothing?: Total 6 Click Score: 11    End of Session Equipment Utilized During Treatment: Gait belt  OT Visit Diagnosis: Unsteadiness on feet (R26.81);Muscle weakness (generalized) (M62.81)   Activity Tolerance Patient tolerated treatment well   Patient Left in chair;with call bell/phone within reach;with chair alarm set;with family/visitor present   Nurse Communication Mobility status        Time: 0354-6568 OT Time Calculation (min): 58 min  Charges: OT General Charges $OT Visit: 1 Visit OT  Treatments $Therapeutic Activity: 23-37 mins  Nilsa Nutting., OTR/L Acute Rehabilitation Services Pager [phone] Office [place], [place] 09/13/2019, 5:40 PM

## 2019-09-13 NOTE — Progress Notes (Signed)
Physical Therapy Treatment Patient Details Name: Jason Skinner MRN: [id number] DOB: Aug 03, 1990 Today's Date: 09/13/2019    History of Present Illness 29yo male who was brought into [facility] earlier today as a level 2 trauma after falling about 20 feet from a ladder on construction site. Pt is found to have B SDH R>L with midline shift and uncal herniation, L temporal contusion and SAH, Occipital and clivial midline fxs extending into R sphenoid with hemosinus and pneumocephalus, respiratory failure, atelectasis, L1-2 TP fxs. Pt was intubated on 6/30. Pt underwent R frontotemporoparietal craniectomy for evacuation of acute subdural hematoma; insertion of right frontal ventriculostomy; insertion of craniotomy flap into the abdominal subcutaneous tissue. Pt extubated on 7/5.    PT Comments    Pt tolerated treatment well although response time and participation does begin to wane with fatigue half way through session. Pt continues to demonstrate poor initiation and is very unsteady during standing activities. Pt requires weight shifting assistance to initiate steps, and demonstrates significant ideational apraxia during session. Pt will continue to benefit from acute PT POC to improve activity tolerance, balance, and functional mobility quality. PT continues to recommend CIR placement at this time.   Follow Up Recommendations  CIR     Equipment Recommendations  Wheelchair (measurements PT);Wheelchair cushion (measurements PT);Hospital bed    Recommendations for Other Services       Precautions / Restrictions Precautions Precautions: Fall Precaution Comments: bone flap in abdomen, helmet when OOB Required Braces or Orthoses: Cervical Brace;Other Brace Cervical Brace: Hard collar;At all times Restrictions Weight Bearing Restrictions: No    Mobility  Bed Mobility Overal bed mobility: Needs Assistance Bed Mobility: Supine to Sit     Supine to sit: Max assist        Transfers Overall  transfer level: Needs assistance Equipment used: 2 person hand held assist Transfers: Sit to/from [employer] Transfers Sit to Stand: Mod assist Stand pivot transfers: Mod assist;+2 physical assistance       General transfer comment: pt requiring initiation of weight shift to perform steps when turning from bed to recliner  Ambulation/Gait Ambulation/Gait assistance: Mod assist;+2 physical assistance Gait Distance (Feet): 2 Feet Assistive device: 2 person hand held assist Gait Pattern/deviations: Step-to pattern;Shuffle Gait velocity: reduced Gait velocity interpretation: <1.31 ft/sec, indicative of household ambulator General Gait Details: t with short step to gait, reduced foot clearance bilaterally but more significantly with RLE. Pt requires physical initiation of weight shift to take steps   Stairs             Wheelchair Mobility    Modified Rankin (Stroke Patients Only) Modified Rankin (Stroke Patients Only) Pre-Morbid Rankin Score: No symptoms Modified Rankin: Moderately severe disability     Balance Overall balance assessment: Needs assistance Sitting-balance support: Bilateral upper extremity supported;Feet supported Sitting balance-Leahy Scale: Poor Sitting balance - Comments: minA to maintain static sitting balance, sway in all directions throughout session   Standing balance support: Bilateral upper extremity supported Standing balance-Leahy Scale: Poor Standing balance comment: modA to maintain static stading balance with BUE support of PT/OT                            Cognition Arousal/Alertness: Awake/alert Behavior During Therapy: Flat affect Overall Cognitive Status: Impaired/Different from baseline Area of Impairment: Attention;Memory;Following commands;Safety/judgement;Awareness;Problem solving               Rancho Levels of Cognitive Functioning Rancho Los Amigos Scales of Cognitive Functioning:  Confused/inappropriate/non-agitated  Current Attention Level: Focused Memory: Decreased recall of precautions;Decreased short-term memory Following Commands: Follows one step commands with increased time Safety/Judgement: Decreased awareness of safety;Decreased awareness of deficits Awareness: Intellectual Problem Solving: Slow processing;Difficulty sequencing        Exercises      General Comments General comments (skin integrity, edema, etc.): pt does report dizziness when asked during all standign activity, limiting his tolerance. Pt does become tachycardic with standing, up to 132 during transfer. PT unable to record standing BP as pt cannot stand for long enough periods for cuff to get a reading. Video chat interpreter utilized during session, pt's brother also assisting some      Pertinent Vitals/Pain Pain Assessment: No/denies pain (borther does report pt has been reporting pain R shld)    Home Living                      Prior Function            PT Goals (current goals can now be found in the care plan section) Acute Rehab PT Goals Patient Stated Goal: none stated.  Progress towards PT goals: Progressing toward goals    Frequency    Min 5X/week      PT Plan Current plan remains appropriate    Co-evaluation PT/OT/SLP Co-Evaluation/Treatment: Yes Reason for Co-Treatment: Complexity of the patient's impairments (multi-system involvement);Necessary to address cognition/behavior during functional activity;For patient/therapist safety;To address functional/ADL transfers PT goals addressed during session: Mobility/safety with mobility;Balance;Proper use of DME;Strengthening/ROM        AM-PAC PT "6 Clicks" Mobility   Outcome Measure  Help needed turning from your back to your side while in a flat bed without using bedrails?: A Lot Help needed moving from lying on your back to sitting on the side of a flat bed without using bedrails?: Total Help needed  moving to and from a bed to a chair (including a wheelchair)?: A Lot Help needed standing up from a chair using your arms (e.g., wheelchair or bedside chair)?: A Lot Help needed to walk in hospital room?: A Lot Help needed climbing 3-5 steps with a railing? : Total 6 Click Score: 10    End of Session   Activity Tolerance: Patient tolerated treatment well Patient left: in chair;with call bell/phone within reach;with chair alarm set;with family/visitor present Nurse Communication: Mobility status PT Visit Diagnosis: Unsteadiness on feet (R26.81);Other abnormalities of gait and mobility (R26.89);Muscle weakness (generalized) (M62.81);Ataxic gait (R26.0);Other symptoms and signs involving the nervous system (R29.898)     Time: 3159-4585 PT Time Calculation (min) (ACUTE ONLY): 46 min  Charges:  $Therapeutic Activity: 23-37 mins                     Zenaida Niece, PT, DPT Acute Rehabilitation Pager: [phone]    Zenaida Niece 09/13/2019, 12:39 PM

## 2019-09-13 NOTE — Consult Note (Signed)
Physical Medicine and Rehabilitation Consult Reason for Consult: Altered mental status Referring Physician: Trauma   HPI: Jason Skinner is a 29 y.o. right-handed male on no prescription medications.  Per report patient lives with friends.  He does not drive.  Mobile home 4 steps to entry.  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  He has been in the [place] for approximately 3 years and has family in [place].  He does by report have a brother in the area.  Presented 09/05/2019 after a fall approximately 20 feet from a ladder while at a construction site.  Noted positive loss of consciousness.  Cranial CT scan showed right more than left cerebral convexity subdural hematoma with leftward midline shift of 2 cm and right uncal herniation.  Left temporal pole contusion with overlying subarachnoid hemorrhage.  Occipital and clival midline fractures extending into the right sphenoid sinus with pneumocephalus.  CT of the chest abdomen pelvis showed an L1 and L2 right transverse process fractures as well as incidental finding of a 2 cm nodular projection from the right thyroid lobe.  Admission chemistries with potassium 3.1, alcohol negative, lactic acid 3.1, SARS coronavirus negative.  Patient underwent right frontotemporoparietal craniectomy for evacuation of acute subdural hematoma and insertion of right frontal ventriculostomy insertion of craniotomy flap into the abdominal subcutaneous tissue 09/05/2019 per Dr. Arnoldo Morale.  Latest follow-up cranial CT scan showed decreased mass-effect of 5 mm leftward midline shift as previously 20 mm.  Patient remained intubated until 09/10/2019.  Patient is currently n.p.o. with alternative means of nutritional support as patient did pull out his CORTRAK and awaiting plan to resume.  Maintained on Keppra for seizure prophylaxis.  Bouts of urinary retention maintained on Urecholine.  Patient was cleared to begin Lovenox for DVT prophylaxis 09/09/2019.  Therapy  evaluations completed with recommendations of physical medicine rehab consult.   Review of Systems  Unable to perform ROS: Acuity of condition   PMH/PSH/FH: patient is unable to provide.  Social History:  has no history on file for tobacco use, alcohol use, and drug use. Allergies: No Known Allergies Medications Prior to Admission  Medication Sig Dispense Refill  . acetaminophen (TYLENOL) 500 MG tablet Take 500 mg by mouth every 6 (six) hours as needed for mild pain.      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 He clean clothes at laundry mat, goes shopping to stores, to goes to [place], rides bike to store because he does not have a car, He has been in [place] 3 years and family in [place]. Brother is here.   Lives With: Friend(s)  Functional History: Prior Function Level of Independence: Independent Comments: wife and 2 daughters in [place] Functional Status:  Mobility: Bed Mobility Overal bed mobility: Needs Assistance Bed Mobility: Sit to Supine Sit to supine: Mod assist, +2 for physical assistance General bed mobility comments: Pt requires tactile cues to roll toward R side and physical (A) to push up from bed surface. pt requires total (A) of therapist to help ensure back precautions and R skull flap protection Transfers Overall transfer level: Needs assistance Equipment used: 1 person hand held assist Transfers: Sit to/from Stand, Stand  Pivot Transfers Sit to Stand: Mod assist Stand pivot transfers: Max assist General transfer comment: Pt with weakness noted  with R LE. pt stepping with L LE toward chair. pt with therapist placing hand on chair to gain visual attention to help with initiation of transfer. pt with no control to sit in chair surface      ADL: ADL Overall ADL's : Needs assistance/impaired Eating/Feeding: Moderate assistance, Sitting Eating/Feeding Details (indicate cue type and reason): pt undershooting when self feeding. pt using L hand instead of right initially.  Grooming: Wash/dry face, Moderate assistance, Sitting Grooming Details (indicate cue type and reason): asked to wash face and reaching with L hand to wash face Upper Body Bathing: Maximal assistance Lower Body Bathing: Total assistance Upper Body Dressing : Maximal assistance Lower Body Dressing: Total assistance Toilet Transfer: Moderate assistance, Buyer, retail Details (indicate cue type and reason): simulated bed to chair  Toileting- Clothing Manipulation and Hygiene: Total assistance General ADL Comments: pt transfered from bed to chair this session. pt noted to have dysconjugate gaze  Cognition: Cognition Overall Cognitive Status: Impaired/Different from baseline Arousal/Alertness: Awake/alert Orientation Level: Oriented to person Attention: Focused, Sustained Focused Attention: Appears intact Sustained Attention: Appears intact Memory: Impaired Memory Impairment: Storage deficit, Decreased short term memory, Decreased recall of new information Decreased Short Term Memory: Verbal basic Awareness: Impaired Awareness Impairment: Intellectual impairment, Emergent impairment Problem Solving: Impaired Problem Solving Impairment: Verbal basic Executive Function: Reasoning, Initiating, Self Monitoring Reasoning: Impaired Reasoning Impairment: Verbal basic Initiating: Impaired Initiating Impairment: Verbal basic, Functional basic Self Monitoring: Impaired Self Monitoring Impairment: Verbal basic, Functional basic [employer] [employer] Scales of  Cognitive Functioning: Confused/inappropriate/non-agitated Cognition Arousal/Alertness: Awake/alert Behavior During Therapy: Flat affect Overall Cognitive Status: Impaired/Different from baseline Area of Impairment: Attention, Memory, Following commands, Safety/judgement, Awareness, Problem solving Current Attention Level: Focused Memory: Decreased recall of precautions, Decreased short-term memory Following Commands: Follows multi-step commands with increased time Safety/Judgement: Decreased awareness of safety, Decreased awareness of deficits Awareness: Intellectual Problem Solving: Slow processing, Difficulty sequencing General Comments: pt attempting to feed therapist when cued to self feed. pt attempting to touch SLP hair and touching her foot with his foot. Pt told this was not appropriate but continued even after being asked to stop. pt able to state wife and children names. pt recognized brother in room. Pt unaware of location as hospital even after being told will report "at a friends house". pt unable to retain location as hospital  Blood pressure 120/71, pulse 86, temperature 99.2 F (37.3 C), temperature source Axillary, resp. rate 17, height 5\' 10"  (1.778 m), weight 82.3 kg, SpO2 98 %. Physical Exam  General: Brother is at bedside. Patient is sitting up in chair.  HEENT: Helmet in place, right eye shut. Neck: Supple without JVD or lymphadenopathy Heart: Reg rate and rhythm. No murmurs rubs or gallops Chest: CTA bilaterally without wheezes, rales, or rhonchi; no distress Abdomen: Soft, non-tender, non-distended, bowel sounds positive. Extremities: No clubbing, cyanosis, or edema. Pulses are 2+ Skin: Clean and intact without signs of breakdown Neuro: Patient is lethargic but arousable.  NAD.  Primarily speaks Spanish.  Unable to verbally respond at this time but brother at bedside said he has said a few words. He does follow simple demonstrated commands. Not moving right arm.  Moves left arm well and has 5/5 grip strength, Appears to be moving lower extremities but unable to follow MMT commands.  Psych: Lethargic, cooperative  Results for orders placed or performed during the hospital encounter of  09/05/19 (from the past 24 hour(s))  Glucose, capillary     Status: Abnormal   Collection Time: 09/12/19  8:10 AM  Result Value Ref Range   Glucose-Capillary 121 (H) 70 - 99 mg/dL  CBC     Status: Abnormal   Collection Time: 09/12/19  9:59 AM  Result Value Ref Range   WBC 16.2 (H) 4.0 - 10.5 K/uL   RBC 3.63 (L) 4.22 - 5.81 MIL/uL   Hemoglobin 11.3 (L) 13.0 - 17.0 g/dL   HCT 33.9 (L) 39 - 52 %   MCV 93.4 80.0 - 100.0 fL   MCH 31.1 26.0 - 34.0 pg   MCHC 33.3 30.0 - 36.0 g/dL   RDW 14.6 11.5 - 15.5 %   Platelets 513 (H) 150 - 400 K/uL   nRBC 2.0 (H) 0.0 - 0.2 %  Basic metabolic panel     Status: Abnormal   Collection Time: 09/12/19  9:59 AM  Result Value Ref Range   Sodium 131 (L) 135 - 145 mmol/L   Potassium 3.6 3.5 - 5.1 mmol/L   Chloride 97 (L) 98 - 111 mmol/L   CO2 23 22 - 32 mmol/L   Glucose, Bld 119 (H) 70 - 99 mg/dL   BUN 13 6 - 20 mg/dL   Creatinine, Ser 0.49 (L) 0.61 - 1.24 mg/dL   Calcium 8.7 (L) 8.9 - 10.3 mg/dL   GFR calc non Af Amer >60 >60 mL/min   GFR calc Af Amer >60 >60 mL/min   Anion gap 11 5 - 15  Magnesium     Status: None   Collection Time: 09/12/19  9:59 AM  Result Value Ref Range   Magnesium 2.4 1.7 - 2.4 mg/dL  Phosphorus     Status: None   Collection Time: 09/12/19  9:59 AM  Result Value Ref Range   Phosphorus 3.6 2.5 - 4.6 mg/dL  Glucose, capillary     Status: Abnormal   Collection Time: 09/12/19 11:43 AM  Result Value Ref Range   Glucose-Capillary 133 (H) 70 - 99 mg/dL  Glucose, capillary     Status: Abnormal   Collection Time: 09/12/19  4:11 PM  Result Value Ref Range   Glucose-Capillary 130 (H) 70 - 99 mg/dL  Glucose, capillary     Status: Abnormal   Collection Time: 09/12/19  4:40 PM  Result Value Ref Range    Glucose-Capillary 132 (H) 70 - 99 mg/dL  Glucose, capillary     Status: Abnormal   Collection Time: 09/12/19  9:40 PM  Result Value Ref Range   Glucose-Capillary 134 (H) 70 - 99 mg/dL   Comment 1 Notify RN   CBC     Status: Abnormal   Collection Time: 09/13/19  4:33 AM  Result Value Ref Range   WBC 15.6 (H) 4.0 - 10.5 K/uL   RBC 4.14 (L) 4.22 - 5.81 MIL/uL   Hemoglobin 12.8 (L) 13.0 - 17.0 g/dL   HCT 38.8 (L) 39 - 52 %   MCV 93.7 80.0 - 100.0 fL   MCH 30.9 26.0 - 34.0 pg   MCHC 33.0 30.0 - 36.0 g/dL   RDW 14.8 11.5 - 15.5 %   Platelets 501 (H) 150 - 400 K/uL   nRBC 0.8 (H) 0.0 - 0.2 %   DG Chest Port 1 View  Result Date: 09/11/2019 CLINICAL DATA:  Respiratory failure, fall, head injury EXAM: PORTABLE CHEST 1 VIEW COMPARISON:  09/06/2019 FINDINGS: The heart size and mediastinal contours are within normal limits. Subtle  heterogeneous airspace opacities of the lung bases. The visualized skeletal structures are unremarkable. IMPRESSION: Subtle heterogeneous airspace opacities of the lung bases, suspicious for infection or aspiration. Electronically Signed   By: Eddie Candle M.D.   On: 09/11/2019 10:57     Assessment/Plan: Diagnosis: Bilateral subdural hemorrhage R>L following 20 foot fall from ladder 1. Does the need for close, 24 hr/day medical supervision in concert with the patient's rehab needs make it unreasonable for this patient to be served in a less intensive setting? Yes 2. Co-Morbidities requiring supervision/potential complications: occipital and clivial midline fractures extending into R phenoid with hemosinus and pneumocephalus, VDRF, L 1/2 R transverse process fractures, leukocytosis, urinary retention 3. Due to bladder management, bowel management, safety, skin/wound care, disease management, medication administration, pain management and patient education, does the patient require 24 hr/day rehab nursing? Yes 4. Does the patient require coordinated care of a physician,  rehab nurse, therapy disciplines of PT, OT, SLP to address physical and functional deficits in the context of the above medical diagnosis(es)? Yes Addressing deficits in the following areas: balance, endurance, locomotion, strength, transferring, bowel/bladder control, bathing, dressing, feeding, grooming, toileting, cognition, speech, language, swallowing and psychosocial support 5. Can the patient actively participate in an intensive therapy program of at least 3 hrs of therapy per day at least 5 days per week? Yes 6. The potential for patient to make measurable gains while on inpatient rehab is good 7. Anticipated functional outcomes upon discharge from inpatient rehab are min assist  with PT, min assist with OT, min assist with SLP. 8. Estimated rehab length of stay to reach the above functional goals is: 2-3 weeks 9. Anticipated discharge destination: Home 10. Overall Rehab/Functional Prognosis: good  RECOMMENDATIONS: This patient's condition is appropriate for continued rehabilitative care in the following setting: CIR Patient has agreed to participate in recommended program. N/A (patient unable to verbally respond) Note that insurance prior authorization may be required for reimbursement for recommended care.  Comment: Mr. [name] would be a good CIR candidate. He has intensive PT, OT, and SLP needs. He has the support of his brother. His wife and 2 children live in [place]. 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 [place].   Lavon Paganini Angiulli, PA-C 09/13/2019   I have personally performed a face to face diagnostic evaluation, including, but not limited to relevant history and physical exam findings, of this patient and developed relevant assessment and plan.  Additionally, I have reviewed and concur with the physician assistant's documentation above.  Leeroy Cha, MD

## 2019-09-13 NOTE — Progress Notes (Signed)
Pt pulled out CORTRAK, PIV, pulled off [place] J neck brace, condom cath, ID band. Tube feeds have been stopped. MD aware via page. Mervin Hack

## 2019-09-13 NOTE — Progress Notes (Addendum)
Inpatient Rehabilitation Admissions Coordinator  I contacted Jason Skinner, interpreter to request she contact pt's family to arrange a meeting to discuss his rehab needs with me.  Jason Baxter, RN, MSN Rehab Admissions Coordinator [phone] 09/13/2019 2:56 PM   I met with Jason Skinner, brother with [facility] interpreting. I discussed goals and expectations of a possible CIR admit. I discussed need for caregiver support after CIR. Brother hopeful that pt's wife can come from [place], but await the [place] clearance. [name] to clarify with pt's employer if this is workers Health and safety inspector covered. I will follow his progress.  Jason Baxter, RN, MSN Rehab Admissions Coordinator [phone] 09/13/2019 4:51 PM

## 2019-09-13 NOTE — Progress Notes (Signed)
Subjective: The patient is alert and attentive/purposeful.  We have a language barrier.  He is in no apparent distress.  Objective: Vital signs in last 24 hours: Temp:  [97.9 F (36.6 C)-100.4 F (38 C)] 100.4 F (38 C) (07/08 0750) Pulse Rate:  [70-98] 98 (07/08 0750) Resp:  [16-22] 16 (07/08 0750) BP: (107-130)/(68-84) 107/74 (07/08 0750) SpO2:  [95 %-98 %] 95 % (07/08 0750) Estimated body mass index is 26.03 kg/m as calculated from the following:   Height as of this encounter: 5\' 10"  (1.778 m).   Weight as of this encounter: 82.3 kg.   Intake/Output from previous day: 07/07 0701 - 07/08 0700 In: 56.3 [NG/GT:56.3] Out: 1300 [Urine:1300] Intake/Output this shift: No intake/output data recorded.  Physical exam the patient is alert and attentive.  He is purposeful bilaterally.  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  Lab Results: Recent Labs    09/12/19 0959 09/13/19 0433  WBC 16.2* 15.6*  HGB 11.3* 12.8*  HCT 33.9* 38.8*  PLT 513* 501*   BMET Recent Labs    09/12/19 0959 09/13/19 0433  NA 131* 132*  K 3.6 4.1  CL 97* 98  CO2 23 19*  GLUCOSE 119* 99  BUN 13 13  CREATININE 0.49* 0.50*  CALCIUM 8.7* 8.8*    Studies/Results: DG Chest Port 1 View  Result Date: 09/11/2019 CLINICAL DATA:  Respiratory failure, fall, head injury EXAM: PORTABLE CHEST 1 VIEW COMPARISON:  09/06/2019 FINDINGS: The heart size and mediastinal contours are within normal limits. Subtle heterogeneous airspace opacities of the lung bases. The visualized skeletal structures are unremarkable. IMPRESSION: Subtle heterogeneous airspace opacities of the lung bases, suspicious for infection or aspiration. Electronically Signed   By: Eddie Candle M.D.   On: 09/11/2019 10:57    Assessment/Plan: Postop day #8: The patient is doing well clinically.  He will need rehab and eventual replacement of his craniectomy flap.  LOS: 8 days     Ophelia Charter 09/13/2019, 8:16 AM

## 2019-09-13 NOTE — Progress Notes (Signed)
  Speech Language Pathology Treatment: Dysphagia  Patient Details Name: Jason Skinner MRN: [id number] DOB: November 13, 1990 Today's Date: 09/13/2019 Time: 7034-0352 SLP Time Calculation (min) (ACUTE ONLY): 8 min  Assessment / Plan / Recommendation Clinical Impression  Pt seen after a bath, fully alert, starting initiate self feeding more, making some requests. Affect more appropriate today. Some basic problem solving noted related to vision. Pt pulled out his Cortrak last night, but hasn't really been given any sips of water today as recommended yesterday. Pt demonstrates impulsive intake. Needs verbal cues to slow down to 2-3 sips at a time. Pt did have some mild coughing after prolonged intake. With assist for an appropriate rate pt had no coughing, was able to self feed pudding and a cookie. Will advance diet to mech soft and thin liquids with supervision from brother or staff for careful feeding. Will f/u tomorrow.   HPI HPI: Jason Skinner is a 29yo male who was brought into [facility] 6/30 as a level 2 trauma after falling about 20 feet from a ladder on construction site.  Per report +LOC after fall, then patient was alert and oriented. He decompensated with EMS and was upgraded to a level 1, intubated en route. Found to have Right subdural hematoma, occipital skull fracture, clivus fracture, L1/2 R tranverse process fx, traumatic brain injury. Underwent  Right frontotemporoparietal craniectomy for evacuation of acute subdural hematoma; insertion of right frontal ventriculostomy; insertion of craniotomy flap into the abdominal subcutaneous tissue. Extubated 7/5.       SLP Plan  Continue with current plan of care       Recommendations  Diet recommendations: Dysphagia 3 (mechanical soft);Thin liquid Liquids provided via: Straw Medication Administration: Whole meds with puree Supervision: Patient able to self feed Compensations: Slow rate;Small sips/bites Postural Changes and/or Swallow Maneuvers: Seated  upright 90 degrees                Follow up Recommendations: Inpatient Rehab SLP Visit Diagnosis: Dysphagia, oral phase (R13.11) Plan: Continue with current plan of care       GO               Jason [place], MA [place] Pager [phone] Office [phone]  Lynann Beaver 09/13/2019, 2:36 PM

## 2019-09-14 ENCOUNTER — Inpatient Hospital Stay (HOSPITAL_COMMUNITY): Payer: Medicaid Other

## 2019-09-14 LAB — CBC
HCT: 35 % — ABNORMAL LOW (ref 39.0–52.0)
Hemoglobin: 11.5 g/dL — ABNORMAL LOW (ref 13.0–17.0)
MCH: 30.7 pg (ref 26.0–34.0)
MCHC: 32.9 g/dL (ref 30.0–36.0)
MCV: 93.6 fL (ref 80.0–100.0)
Platelets: 603 10*3/uL — ABNORMAL HIGH (ref 150–400)
RBC: 3.74 MIL/uL — ABNORMAL LOW (ref 4.22–5.81)
RDW: 14.7 % (ref 11.5–15.5)
WBC: 16.7 10*3/uL — ABNORMAL HIGH (ref 4.0–10.5)
nRBC: 0 % (ref 0.0–0.2)

## 2019-09-14 LAB — URINALYSIS, ROUTINE W REFLEX MICROSCOPIC
Bilirubin Urine: NEGATIVE
Glucose, UA: NEGATIVE mg/dL
Hgb urine dipstick: NEGATIVE
Ketones, ur: NEGATIVE mg/dL
Leukocytes,Ua: NEGATIVE
Nitrite: NEGATIVE
Protein, ur: NEGATIVE mg/dL
Specific Gravity, Urine: 1.018 (ref 1.005–1.030)
pH: 6 (ref 5.0–8.0)

## 2019-09-14 LAB — GLUCOSE, CAPILLARY
Glucose-Capillary: 103 mg/dL — ABNORMAL HIGH (ref 70–99)
Glucose-Capillary: 105 mg/dL — ABNORMAL HIGH (ref 70–99)
Glucose-Capillary: 106 mg/dL — ABNORMAL HIGH (ref 70–99)
Glucose-Capillary: 107 mg/dL — ABNORMAL HIGH (ref 70–99)
Glucose-Capillary: 109 mg/dL — ABNORMAL HIGH (ref 70–99)
Glucose-Capillary: 156 mg/dL — ABNORMAL HIGH (ref 70–99)
Glucose-Capillary: 94 mg/dL (ref 70–99)

## 2019-09-14 LAB — BASIC METABOLIC PANEL
Anion gap: 10 (ref 5–15)
BUN: 16 mg/dL (ref 6–20)
CO2: 23 mmol/L (ref 22–32)
Calcium: 9 mg/dL (ref 8.9–10.3)
Chloride: 100 mmol/L (ref 98–111)
Creatinine, Ser: 0.51 mg/dL — ABNORMAL LOW (ref 0.61–1.24)
GFR calc Af Amer: >60 mL/min (ref >60)
GFR calc non Af Amer: >60 mL/min (ref >60)
Glucose, Bld: 112 mg/dL — ABNORMAL HIGH (ref 70–99)
Potassium: 3.8 mmol/L (ref 3.5–5.1)
Sodium: 133 mmol/L — ABNORMAL LOW (ref 135–145)

## 2019-09-14 LAB — PHOSPHORUS: Phosphorus: 3.4 mg/dL (ref 2.5–4.6)

## 2019-09-14 LAB — MAGNESIUM: Magnesium: 2.1 mg/dL (ref 1.7–2.4)

## 2019-09-14 MED ORDER — SODIUM CHLORIDE 1 G PO TABS
1.0000 g | ORAL_TABLET | Freq: Two times a day (BID) | ORAL | Status: DC
  Administered 2019-09-14 – 2019-09-20 (×12): 1 g via ORAL
  Filled 2019-09-14 (×13): qty 1

## 2019-09-14 MED ORDER — PROSOURCE PLUS PO LIQD
30.0000 mL | Freq: Three times a day (TID) | ORAL | Status: DC
  Filled 2019-09-14 (×4): qty 30

## 2019-09-14 MED ORDER — ENSURE ENLIVE PO LIQD
237.0000 mL | Freq: Three times a day (TID) | ORAL | Status: DC
  Administered 2019-09-14 – 2019-09-20 (×19): 237 mL via ORAL

## 2019-09-14 NOTE — Progress Notes (Signed)
Physical Therapy Treatment Patient Details Name: Jason Skinner MRN: [id number] DOB: 1991-03-01 Today's Date: 09/14/2019    History of Present Illness 29yo male who was brought into [facility] earlier today as a level 2 trauma after falling about 20 feet from a ladder on construction site. Pt is found to have B SDH R>L with midline shift and uncal herniation, L temporal contusion and SAH, Occipital and clivial midline fxs extending into R sphenoid with hemosinus and pneumocephalus, respiratory failure, atelectasis, L1-2 TP fxs. Pt was intubated on 6/30. Pt underwent R frontotemporoparietal craniectomy for evacuation of acute subdural hematoma; insertion of right frontal ventriculostomy; insertion of craniotomy flap into the abdominal subcutaneous tissue. Pt extubated on 7/5.    PT Comments    Pt tolerates treatment well with improved initiation during tasks and less processing time to follow motor commands. Pt continues to demonstrate significant functional mobility and balance deficits, requiring physical assistance to prevent falls during all upright activity. Pt is better able to express needs this session, reporting the need to utilize the bathroom and able to wait until given a command to do so as RN needed urine sample. Pt will continue to benefit from acute PT POC to improve mobility quality and to reduce falls risk. PT continues to recommend CIR at this time.   Follow Up Recommendations  CIR     Equipment Recommendations  Wheelchair (measurements PT);Wheelchair cushion (measurements PT);Hospital bed    Recommendations for Other Services       Precautions / Restrictions Precautions Precautions: Fall Precaution Comments: bone flap in abdomen, helmet when OOB Required Braces or Orthoses: Cervical Brace;Other Brace Cervical Brace: Hard collar;At all times Other Brace: helmet for OOB  Restrictions Weight Bearing Restrictions: No    Mobility  Bed Mobility Overal bed mobility: Needs  Assistance Bed Mobility: Supine to Sit     Supine to sit: Mod assist        Transfers Overall transfer level: Needs assistance Equipment used: 1 person hand held assist Transfers: Sit to/from Stand;Stand Pivot Transfers Sit to Stand: Mod assist Stand pivot transfers: Mod assist       General transfer comment: pt with posterior lean, requires minimal assist to weight shift  Ambulation/Gait Ambulation/Gait assistance: Mod assist Gait Distance (Feet): 2 Feet Assistive device: 1 person hand held assist Gait Pattern/deviations: Step-to pattern Gait velocity: reduced Gait velocity interpretation: <1.8 ft/sec, indicate of risk for recurrent falls General Gait Details: short step to gait, reduced foot clearance bilaterally, requires tactile cueing to facilitate proper direction   Stairs             Wheelchair Mobility    Modified Rankin (Stroke Patients Only) Modified Rankin (Stroke Patients Only) Pre-Morbid Rankin Score: No symptoms Modified Rankin: Moderately severe disability     Balance Overall balance assessment: Needs assistance Sitting-balance support: No upper extremity supported;Feet supported Sitting balance-Leahy Scale: Poor Sitting balance - Comments: min-modA, left lean intermittently Postural control: Left lateral lean Standing balance support: Bilateral upper extremity supported Standing balance-Leahy Scale: Poor Standing balance comment: reliant on BUE support, posterior lean                            Cognition Arousal/Alertness: Awake/alert Behavior During Therapy: Flat affect Overall Cognitive Status: Impaired/Different from baseline Area of Impairment: Attention;Memory;Following commands;Safety/judgement;Awareness;Problem solving               Rancho Levels of Cognitive Functioning Rancho Los Amigos Scales of Cognitive Functioning: Confused/appropriate  Current Attention Level: Focused Memory: Decreased recall of  precautions;Decreased short-term memory Following Commands: Follows one step commands consistently;Follows one step commands with increased time Safety/Judgement: Decreased awareness of safety;Decreased awareness of deficits Awareness: Intellectual Problem Solving: Slow processing;Difficulty sequencing;Requires verbal cues        Exercises      General Comments General comments (skin integrity, edema, etc.): pt reports some dizziness with mobility this session but not durign transfer.      Pertinent Vitals/Pain Pain Assessment: Faces Pain Score: 5  Pain Location: back Pain Descriptors / Indicators: Aching Pain Intervention(s): Monitored during session    Home Living                      Prior Function            PT Goals (current goals can now be found in the care plan section) Acute Rehab PT Goals Patient Stated Goal: none stated.  Progress towards PT goals: Progressing toward goals    Frequency    Min 5X/week      PT Plan Current plan remains appropriate    Co-evaluation              AM-PAC PT "6 Clicks" Mobility   Outcome Measure  Help needed turning from your back to your side while in a flat bed without using bedrails?: A Lot Help needed moving from lying on your back to sitting on the side of a flat bed without using bedrails?: A Lot Help needed moving to and from a bed to a chair (including a wheelchair)?: A Lot Help needed standing up from a chair using your arms (e.g., wheelchair or bedside chair)?: A Lot Help needed to walk in hospital room?: A Lot Help needed climbing 3-5 steps with a railing? : Total 6 Click Score: 11    End of Session   Activity Tolerance: Patient tolerated treatment well Patient left: in chair;with call bell/phone within reach;with chair alarm set;with family/visitor present Nurse Communication: Mobility status PT Visit Diagnosis: Unsteadiness on feet (R26.81);Other abnormalities of gait and mobility  (R26.89);Muscle weakness (generalized) (M62.81);Ataxic gait (R26.0);Other symptoms and signs involving the nervous system (R29.898)     Time: 4967-5916 PT Time Calculation (min) (ACUTE ONLY): 32 min  Charges:  $Therapeutic Activity: 23-37 mins                     Zenaida Niece, PT, DPT Acute Rehabilitation Pager: [phone]    Zenaida Niece 09/14/2019, 12:52 PM

## 2019-09-14 NOTE — Progress Notes (Signed)
Subjective: The patient is somnolent but easily arousable.  He answers simple questions appropriately.  He is in no apparent distress.  Objective: Vital signs in last 24 hours: Temp:  [98.2 F (36.8 C)-100.4 F (38 C)] 98.4 F (36.9 C) (07/09 0449) Pulse Rate:  [71-98] 71 (07/09 0449) Resp:  [15-21] 20 (07/09 0449) BP: (94-118)/(60-77) 113/60 (07/09 0449) SpO2:  [95 %-98 %] 97 % (07/09 0449) Estimated body mass index is 26.03 kg/m as calculated from the following:   Height as of this encounter: 5\' 10"  (1.778 m).   Weight as of this encounter: 82.3 kg.   Intake/Output from previous day: 07/08 0701 - 07/09 0700 In: 150 [P.O.:150] Out: 1275 [Urine:1275] Intake/Output this shift: Total I/O In: 150 [P.O.:150] Out: 825 [Urine:825]  Physical exam Glasgow Coma Scale [id number] 11.  He is moving all 4 extremities.  He answers simple questions.  Lab Results: Recent Labs    09/13/19 0433 09/14/19 0607  WBC 15.6* 16.7*  HGB 12.8* 11.5*  HCT 38.8* 35.0*  PLT 501* 603*   BMET Recent Labs    09/12/19 0959 09/13/19 0433  NA 131* 132*  K 3.6 4.1  CL 97* 98  CO2 23 19*  GLUCOSE 119* 99  BUN 13 13  CREATININE 0.49* 0.50*  CALCIUM 8.7* 8.8*    Studies/Results: No results found.  Assessment/Plan: Postop day #9: The patient is doing well.  We are awaiting rehab placement.  We will plan to remove the staples next week.  He will need to have his craniotomy flap replaced in a few weeks.  LOS: 9 days     Ophelia Charter 09/14/2019, 6:48 AM

## 2019-09-14 NOTE — Progress Notes (Addendum)
Nutrition Follow-up  DOCUMENTATION CODES:   Not applicable  INTERVENTION:  Provide Ensure Enlive po TID, each supplement provides 350 kcal and 20 grams of protein.  Provide 30 ml Prosource plus po TID, each supplement provides 100 kcal and 15 grams of protein.   Encourage adequate PO intake.  NUTRITION DIAGNOSIS:   Increased nutrient needs related to  (TBI) as evidenced by estimated needs; ongoing  GOAL:   Patient will meet greater than or equal to 90% of their needs; progressing  MONITOR:   PO intake, Supplement acceptance, Diet advancement, Skin, Weight trends, Labs, I & O's  REASON FOR ASSESSMENT:   Consult, Ventilator Enteral/tube feeding initiation and management  ASSESSMENT:   Pt with no PMH admitted after 20 foot fall at a construction site with bil SDH R>L with midline shift and uncal herniation, L temporal contusion and SAH s/p decompressive R craniectomy, occipital and clival midline fxs, and R elbow deformity.  7/5 extubated 7/6 Cortrak placed; tip gastric  Pt pulled out Cortrak NGT yesterday. Diet advanced to a dysphagia 3 diet with thin liquids. Meal completion has been 10-25%. Per SLP, pt appetite improving and eagerness to eat. Cortrak replacement on hold at this time per MD. RD to order nutritional supplements to aid in caloric and protein needs. Pt encouraged to eat his food at meals.    Labs and medications reviewed.   Diet Order:   Diet Order            DIET DYS 3 Room service appropriate? Yes; Fluid consistency: Thin  Diet effective now                 EDUCATION NEEDS:   No education needs have been identified at this time  Skin:  Skin Assessment: Reviewed RN Assessment  Last BM:  7/7  Height:   Ht Readings from Last 1 Encounters:  09/05/19 5\' 10"  (1.778 m)    Weight:   Wt Readings from Last 1 Encounters:  09/11/19 82.3 kg    Ideal Body Weight:  75.4 kg  BMI:  Body mass index is 26.03 kg/m.  Estimated Nutritional Needs:    Kcal:  2300-2500  Protein:  120-145 grams  Fluid:  >2 L/day   Corrin Parker, MS, RD, LDN RD pager number/after hours weekend pager number on Amion.

## 2019-09-14 NOTE — Progress Notes (Signed)
Trauma/Critical Care Follow Up Note  Subjective:    Overnight Issues: cortrak pulled out  Objective:  Vital signs for last 24 hours: Temp:  [98.2 F (36.8 C)-98.8 F (37.1 C)] 98.2 F (36.8 C) (07/09 0803) Pulse Rate:  [16-98] 16 (07/09 0803) Resp:  [15-21] 20 (07/09 0449) BP: (94-118)/(60-77) 113/60 (07/09 0449) SpO2:  [96 %-100 %] 100 % (07/09 0803)  Hemodynamic parameters for last 24 hours:    Intake/Output from previous day: 07/08 0701 - 07/09 0700 In: 150 [P.O.:150] Out: 1275 [Urine:1275]  Intake/Output this shift: No intake/output data recorded.  Vent settings for last 24 hours:    Physical Exam:  Gen: comfortable, no distress Neuro: non-focal exam, follows commands briskly, responds to questions HEENT: PERRL Neck: supple CV: RRR Pulm: unlabored breathing on RA Abd: soft, NT GU: clear yellow urine, condom cath Extr: wwp, no edema   Results for orders placed or performed during the hospital encounter of 09/05/19 (from the past 24 hour(s))  Glucose, capillary     Status: Abnormal   Collection Time: 09/13/19 11:48 AM  Result Value Ref Range   Glucose-Capillary 114 (H) 70 - 99 mg/dL  Glucose, capillary     Status: Abnormal   Collection Time: 09/13/19  5:13 PM  Result Value Ref Range   Glucose-Capillary 125 (H) 70 - 99 mg/dL  Glucose, capillary     Status: Abnormal   Collection Time: 09/13/19  8:24 PM  Result Value Ref Range   Glucose-Capillary 119 (H) 70 - 99 mg/dL  Glucose, capillary     Status: Abnormal   Collection Time: 09/14/19 12:50 AM  Result Value Ref Range   Glucose-Capillary 103 (H) 70 - 99 mg/dL  Glucose, capillary     Status: Abnormal   Collection Time: 09/14/19  4:53 AM  Result Value Ref Range   Glucose-Capillary 106 (H) 70 - 99 mg/dL  CBC     Status: Abnormal   Collection Time: 09/14/19  6:07 AM  Result Value Ref Range   WBC 16.7 (H) 4.0 - 10.5 K/uL   RBC 3.74 (L) 4.22 - 5.81 MIL/uL   Hemoglobin 11.5 (L) 13.0 - 17.0 g/dL   HCT  35.0 (L) 39 - 52 %   MCV 93.6 80.0 - 100.0 fL   MCH 30.7 26.0 - 34.0 pg   MCHC 32.9 30.0 - 36.0 g/dL   RDW 14.7 11.5 - 15.5 %   Platelets 603 (H) 150 - 400 K/uL   nRBC 0.0 0.0 - 0.2 %  Basic metabolic panel     Status: Abnormal   Collection Time: 09/14/19  6:07 AM  Result Value Ref Range   Sodium 133 (L) 135 - 145 mmol/L   Potassium 3.8 3.5 - 5.1 mmol/L   Chloride 100 98 - 111 mmol/L   CO2 23 22 - 32 mmol/L   Glucose, Bld 112 (H) 70 - 99 mg/dL   BUN 16 6 - 20 mg/dL   Creatinine, Ser 0.51 (L) 0.61 - 1.24 mg/dL   Calcium 9.0 8.9 - 10.3 mg/dL   GFR calc non Af Amer >60 >60 mL/min   GFR calc Af Amer >60 >60 mL/min   Anion gap 10 5 - 15  Magnesium     Status: None   Collection Time: 09/14/19  6:07 AM  Result Value Ref Range   Magnesium 2.1 1.7 - 2.4 mg/dL  Phosphorus     Status: None   Collection Time: 09/14/19  6:07 AM  Result Value Ref Range  Phosphorus 3.4 2.5 - 4.6 mg/dL  Glucose, capillary     Status: Abnormal   Collection Time: 09/14/19  8:04 AM  Result Value Ref Range   Glucose-Capillary 109 (H) 70 - 99 mg/dL    Assessment & Plan:   Present on Admission: . SDH (subdural hematoma) (HCC)    LOS: 9 days   Additional comments:I reviewed the patient's new clinical lab test results.   and I reviewed the patients new imaging test results.    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 Arnoldo Morale 6/30. Bone flap in abdominal wall. Helmet when OOB.  Occipital and clival midline fxs extending into R sphenoid with hemosinus and pneumocephalus- maintain c-collar VDRF- extubated 7/5 L1/2 R TP fxs- pain control Leukocytosis - monitor Urinary retention - foley out 7/4, on urecholine FEN - TF to restart after cortrak replacement, start 1g BID salt tabs for hyponatremia VTE - SCDs, LMWH ID - afebrile, monitor leukocytosis Plan -SDU, therapies, ultimate dispo to  [place], MD Trauma & General Surgery Please use [email or web address] to contact on call provider  09/14/2019  *Care during the described time interval was provided by me. I have reviewed this patient's available data, including medical history, events of note, physical examination and test results as part of my evaluation.

## 2019-09-14 NOTE — Progress Notes (Signed)
  Speech Language Pathology Treatment: Dysphagia;Cognitive-Linquistic  Patient Details Name: Jason Skinner MRN: [id number] DOB: June 27, 1990 Today's Date: 09/14/2019 Time: 0740-0800 SLP Time Calculation (min) (ACUTE ONLY): 20 min  Assessment / Plan / Recommendation Clinical Impression  Pt awakened for am meal, good appetite, eager to eat about 25% of meal. Per RN, pt did not tolerate meal last night, may have been fatigued. Today pt masticates sausage, eggs, potatoes and grits WNL, no pocketing. Consumed 4 oz of nectar thick liquids and 4 oz of thin liquids from tray with a straw without coughing. He is oriented to place and situation today, follows all one step commands. He still is not initiating requests or addressing wants and needs without cues. But he was able to make verbal choices of foods on tray when given a verbal option of 4 items. When asked, "what do you want now?" pt made an appropriate choice and on second trial asked to be laid back down and go to sleep. Close to a [place] today. Will continue efforts.   HPI HPI: Cloy Cozzens is a 29yo male who was brought into [facility] 6/30 as a level 2 trauma after falling about 20 feet from a ladder on construction site.  Per report +LOC after fall, then patient was alert and oriented. He decompensated with EMS and was upgraded to a level 1, intubated en route. Found to have Right subdural hematoma, occipital skull fracture, clivus fracture, L1/2 R tranverse process fx, traumatic brain injury. Underwent  Right frontotemporoparietal craniectomy for evacuation of acute subdural hematoma; insertion of right frontal ventriculostomy; insertion of craniotomy flap into the abdominal subcutaneous tissue. Extubated 7/5.       SLP Plan  Continue with current plan of care       Recommendations  Diet recommendations: Dysphagia 3 (mechanical soft);Thin liquid Liquids provided via: Straw Medication Administration: Whole meds with puree Supervision: Staff to assist  with self feeding Compensations: Slow rate;Small sips/bites Postural Changes and/or Swallow Maneuvers: Seated upright 90 degrees                Oral Care Recommendations: Oral care BID Follow up Recommendations: Inpatient Rehab SLP Visit Diagnosis: Dysphagia, oral phase (R13.11) Plan: Continue with current plan of care       GO                Ennis Heavner, Katherene Ponto 09/14/2019, 10:27 AM

## 2019-09-15 ENCOUNTER — Encounter (HOSPITAL_COMMUNITY): Payer: Self-pay

## 2019-09-15 LAB — URINE CULTURE: Culture: NO GROWTH

## 2019-09-15 LAB — BASIC METABOLIC PANEL
Anion gap: 9 (ref 5–15)
BUN: 14 mg/dL (ref 6–20)
CO2: 25 mmol/L (ref 22–32)
Calcium: 8.8 mg/dL — ABNORMAL LOW (ref 8.9–10.3)
Chloride: 99 mmol/L (ref 98–111)
Creatinine, Ser: 0.56 mg/dL — ABNORMAL LOW (ref 0.61–1.24)
GFR calc Af Amer: >60 mL/min (ref >60)
GFR calc non Af Amer: >60 mL/min (ref >60)
Glucose, Bld: 108 mg/dL — ABNORMAL HIGH (ref 70–99)
Potassium: 4.1 mmol/L (ref 3.5–5.1)
Sodium: 133 mmol/L — ABNORMAL LOW (ref 135–145)

## 2019-09-15 LAB — CBC
HCT: 37.3 % — ABNORMAL LOW (ref 39.0–52.0)
Hemoglobin: 12.4 g/dL — ABNORMAL LOW (ref 13.0–17.0)
MCH: 31.1 pg (ref 26.0–34.0)
MCHC: 33.2 g/dL (ref 30.0–36.0)
MCV: 93.5 fL (ref 80.0–100.0)
Platelets: 627 10*3/uL — ABNORMAL HIGH (ref 150–400)
RBC: 3.99 MIL/uL — ABNORMAL LOW (ref 4.22–5.81)
RDW: 14.6 % (ref 11.5–15.5)
WBC: 15.1 10*3/uL — ABNORMAL HIGH (ref 4.0–10.5)
nRBC: 0 % (ref 0.0–0.2)

## 2019-09-15 LAB — GLUCOSE, CAPILLARY
Glucose-Capillary: 108 mg/dL — ABNORMAL HIGH (ref 70–99)
Glucose-Capillary: 101 mg/dL — ABNORMAL HIGH (ref 70–99)
Glucose-Capillary: 124 mg/dL — ABNORMAL HIGH (ref 70–99)
Glucose-Capillary: 130 mg/dL — ABNORMAL HIGH (ref 70–99)
Glucose-Capillary: 104 mg/dL — ABNORMAL HIGH (ref 70–99)
Glucose-Capillary: 144 mg/dL — ABNORMAL HIGH (ref 70–99)
Glucose-Capillary: 100 mg/dL — ABNORMAL HIGH (ref 70–99)

## 2019-09-15 LAB — PHOSPHORUS: Phosphorus: 3.5 mg/dL (ref 2.5–4.6)

## 2019-09-15 LAB — MAGNESIUM: Magnesium: 2 mg/dL (ref 1.7–2.4)

## 2019-09-15 MED ORDER — LIDOCAINE 5 % EX PTCH
1.0000 | MEDICATED_PATCH | CUTANEOUS | Status: DC
  Administered 2019-09-15 – 2019-09-20 (×6): 1 via TRANSDERMAL
  Filled 2019-09-15 (×6): qty 1

## 2019-09-15 NOTE — Progress Notes (Signed)
Trauma/Critical Care Follow Up Note  Subjective:    Overnight Issues: passed for diet  Objective:  Vital signs for last 24 hours: Temp:  [98 F (36.7 C)-99.2 F (37.3 C)] 98 F (36.7 C) (07/10 0838) Pulse Rate:  [61-101] 99 (07/10 0838) Resp:  [14-20] 20 (07/10 0838) BP: (102-118)/(61-75) 118/74 (07/10 0838) SpO2:  [97 %-100 %] 98 % (07/10 0412) Weight:  [67 kg] 67 kg (07/10 0456)  Hemodynamic parameters for last 24 hours:    Intake/Output from previous day: 07/09 0701 - 07/10 0700 In: 360 [P.O.:360] Out: 900 [Urine:900]  Intake/Output this shift: No intake/output data recorded.  Vent settings for last 24 hours:    Physical Exam:  Gen: comfortable, no distress Neuro: non-focal exam, follows commands briskly, verbally responds to questions today HEENT: PERRL Neck: supple CV: RRR Pulm: unlabored breathing on RA Abd: soft, NT GU: clear yellow urine, condom cath Extr: wwp, no edema   Results for orders placed or performed during the hospital encounter of 09/05/19 (from the past 24 hour(s))  Culture, Urine     Status: None   Collection Time: 09/14/19 12:06 PM   Specimen: Urine, Clean Catch  Result Value Ref Range   Specimen Description URINE, CLEAN CATCH    Special Requests NONE    Culture      NO GROWTH Performed at [facility], [place] [address] [place], [place] [address]    Report Status 09/15/2019 FINAL   Urinalysis, Routine w reflex microscopic     Status: None   Collection Time: 09/14/19 12:12 PM  Result Value Ref Range   Color, Urine YELLOW YELLOW   APPearance CLEAR CLEAR   Specific Gravity, Urine 1.018 1.005 - 1.030   pH 6.0 5.0 - 8.0   Glucose, UA NEGATIVE NEGATIVE mg/dL   Hgb urine dipstick NEGATIVE NEGATIVE   Bilirubin Urine NEGATIVE NEGATIVE   Ketones, ur NEGATIVE NEGATIVE mg/dL   Protein, ur NEGATIVE NEGATIVE mg/dL   Nitrite NEGATIVE NEGATIVE   Leukocytes,Ua NEGATIVE NEGATIVE  Glucose, capillary     Status: Abnormal   Collection  Time: 09/14/19 12:24 PM  Result Value Ref Range   Glucose-Capillary 107 (H) 70 - 99 mg/dL  Glucose, capillary     Status: None   Collection Time: 09/14/19  4:20 PM  Result Value Ref Range   Glucose-Capillary 94 70 - 99 mg/dL  Glucose, capillary     Status: Abnormal   Collection Time: 09/14/19  8:41 PM  Result Value Ref Range   Glucose-Capillary 156 (H) 70 - 99 mg/dL  Glucose, capillary     Status: Abnormal   Collection Time: 09/14/19 11:29 PM  Result Value Ref Range   Glucose-Capillary 105 (H) 70 - 99 mg/dL  CBC     Status: Abnormal   Collection Time: 09/15/19  2:28 AM  Result Value Ref Range   WBC 15.1 (H) 4.0 - 10.5 K/uL   RBC 3.99 (L) 4.22 - 5.81 MIL/uL   Hemoglobin 12.4 (L) 13.0 - 17.0 g/dL   HCT 37.3 (L) 39 - 52 %   MCV 93.5 80.0 - 100.0 fL   MCH 31.1 26.0 - 34.0 pg   MCHC 33.2 30.0 - 36.0 g/dL   RDW 14.6 11.5 - 15.5 %   Platelets 627 (H) 150 - 400 K/uL   nRBC 0.0 0.0 - 0.2 %  Basic metabolic panel     Status: Abnormal   Collection Time: 09/15/19  2:28 AM  Result Value Ref Range   Sodium  133 (L) 135 - 145 mmol/L   Potassium 4.1 3.5 - 5.1 mmol/L   Chloride 99 98 - 111 mmol/L   CO2 25 22 - 32 mmol/L   Glucose, Bld 108 (H) 70 - 99 mg/dL   BUN 14 6 - 20 mg/dL   Creatinine, Ser 0.56 (L) 0.61 - 1.24 mg/dL   Calcium 8.8 (L) 8.9 - 10.3 mg/dL   GFR calc non Af Amer >60 >60 mL/min   GFR calc Af Amer >60 >60 mL/min   Anion gap 9 5 - 15  Magnesium     Status: None   Collection Time: 09/15/19  2:28 AM  Result Value Ref Range   Magnesium 2.0 1.7 - 2.4 mg/dL  Phosphorus     Status: None   Collection Time: 09/15/19  2:28 AM  Result Value Ref Range   Phosphorus 3.5 2.5 - 4.6 mg/dL  Glucose, capillary     Status: Abnormal   Collection Time: 09/15/19  4:15 AM  Result Value Ref Range   Glucose-Capillary 108 (H) 70 - 99 mg/dL  Glucose, capillary     Status: Abnormal   Collection Time: 09/15/19  8:42 AM  Result Value Ref Range   Glucose-Capillary 101 (H) 70 - 99 mg/dL     Assessment & Plan:   Present on Admission: . SDH (subdural hematoma) (HCC)    LOS: 10 days   Additional comments:I reviewed the patient's new clinical lab test results.   and I reviewed the patients new imaging test results.    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 Arnoldo Morale 6/30. Bone flap in abdominal wall. Helmet when OOB.  Occipital and clival midline fxs extending into R sphenoid with hemosinus and pneumocephalus- maintain c-collar VDRF- extubated 7/5 L1/2 R TP fxs- pain control Leukocytosis - monitor Urinary retention - foley out 7/4, on urecholine Back pain - lidocaine patch FEN - D3 diet, continue salt tabs, increased to 2g VTE - SCDs, LMWH ID - afebrile, monitor leukocytosis, duplex pending Plan -SDU, therapies, ultimate dispo to [place], MD Trauma & General Surgery Please use [email or web address] to contact on call provider  09/15/2019  *Care during the described time interval was provided by me. I have reviewed this patient's available data, including medical history, events of note, physical examination and test results as part of my evaluation.

## 2019-09-15 NOTE — Progress Notes (Signed)
Physical Therapy Treatment Patient Details Name: Jason Skinner MRN: [id number] DOB: 09/16/1990 Today's Date: 09/15/2019    History of Present Illness 29yo male who was brought into [facility] earlier today as a level 2 trauma after falling about 20 feet from a ladder on construction site. Pt is found to have B SDH R>L with midline shift and uncal herniation, L temporal contusion and SAH, Occipital and clivial midline fxs extending into R sphenoid with hemosinus and pneumocephalus, respiratory failure, atelectasis, L1-2 TP fxs. Pt was intubated on 6/30. Pt underwent R frontotemporoparietal craniectomy for evacuation of acute subdural hematoma; insertion of right frontal ventriculostomy; insertion of craniotomy flap into the abdominal subcutaneous tissue. Pt extubated on 7/5.    PT Comments    Pt with constant report of "I feel like I'm going to pass out." and back pain. Pt with improved ambulation tolerance today with maxAX2. Pt continues with R sided weakness, soft spoken, difficulty sequencing and processing. Continue to recommend CIR upon d/c for maximal functional recovery.   Follow Up Recommendations  CIR     Equipment Recommendations  Wheelchair (measurements PT);Wheelchair cushion (measurements PT);Hospital bed    Recommendations for Other Services       Precautions / Restrictions Precautions Precautions: Fall Precaution Comments: bone flap in abdomen, helmet when OOB Required Braces or Orthoses: Cervical Brace;Other Brace Cervical Brace: Hard collar;At all times Other Brace: helmet for OOB  Restrictions Weight Bearing Restrictions: No    Mobility  Bed Mobility Overal bed mobility: Needs Assistance Bed Mobility: Rolling;Sidelying to Sit Rolling: Min guard Sidelying to sit: Mod assist       General bed mobility comments: pt impulsive rolled to the R, modA for trunk elevation, pt with R lateral lean  Transfers Overall transfer level: Needs assistance Equipment used: 2 person  hand held assist Transfers: Sit to/from Stand Sit to Stand: Mod assist         General transfer comment: modAx2 to even weight bearing between R and L LEs  Ambulation/Gait Ambulation/Gait assistance: Max assist;+2 physical assistance;+2 safety/equipment Gait Distance (Feet): 35 Feet (x2) Assistive device: 2 person hand held assist Gait Pattern/deviations: Step-to pattern Gait velocity: reduced Gait velocity interpretation: <1.8 ft/sec, indicate of risk for recurrent falls General Gait Details: max directional verbal cues to push down into PT and PT tech's hands, pt with decreased R LE step length and use of R UE to support self, max support and tactile cues at bilat hips from posteriorly to assist with even weight shifting and stability, c/o onset of low back pain   Stairs             Wheelchair Mobility    Modified Rankin (Stroke Patients Only) Modified Rankin (Stroke Patients Only) Pre-Morbid Rankin Score: No symptoms Modified Rankin: Moderately severe disability     Balance Overall balance assessment: Needs assistance Sitting-balance support: No upper extremity supported;Feet supported Sitting balance-Leahy Scale: Poor Sitting balance - Comments: max verbal cues to maintain midline position, R lateral bias Postural control: Right lateral lean Standing balance support: Bilateral upper extremity supported Standing balance-Leahy Scale: Poor Standing balance comment: reliant on BUE support, posterior lean                            Cognition Arousal/Alertness: Awake/alert Behavior During Therapy: Flat affect;Impulsive Overall Cognitive Status: Impaired/Different from baseline Area of Impairment: Attention;Memory;Following commands;Safety/judgement;Awareness;Problem solving               [place] Levels of  Cognitive Functioning [place] [employer] Scales of Cognitive Functioning: Confused/appropriate   Current Attention Level: Sustained Memory:  Decreased short-term memory Following Commands: Follows one step commands with increased time;Follows one step commands inconsistently Safety/Judgement: Decreased awareness of safety;Decreased awareness of deficits Awareness: Intellectual Problem Solving: Slow processing;Difficulty sequencing;Requires verbal cues General Comments: pt kept wanting to lay down but once up and walking pt able to follow commands fro about 30sec and then would forget      Exercises      General Comments General comments (skin integrity, edema, etc.): pt with continued swelling at head, difficulty opening R eye      Pertinent Vitals/Pain Pain Assessment: Faces Faces Pain Scale: Hurts even more Pain Location: low back Pain Descriptors / Indicators: Aching (during amb) Pain Intervention(s): Monitored during session    Home Living                      Prior Function            PT Goals (current goals can now be found in the care plan section) Progress towards PT goals: Progressing toward goals    Frequency    Min 5X/week      PT Plan Current plan remains appropriate    Co-evaluation              AM-PAC PT "6 Clicks" Mobility   Outcome Measure  Help needed turning from your back to your side while in a flat bed without using bedrails?: A Lot Help needed moving from lying on your back to sitting on the side of a flat bed without using bedrails?: A Lot Help needed moving to and from a bed to a chair (including a wheelchair)?: A Lot Help needed standing up from a chair using your arms (e.g., wheelchair or bedside chair)?: A Lot Help needed to walk in hospital room?: A Lot Help needed climbing 3-5 steps with a railing? : Total 6 Click Score: 11    End of Session Equipment Utilized During Treatment: Gait belt Activity Tolerance: Patient tolerated treatment well Patient left: in chair;with call bell/phone within reach;with chair alarm set;with family/visitor present Nurse  Communication: Mobility status PT Visit Diagnosis: Unsteadiness on feet (R26.81);Other abnormalities of gait and mobility (R26.89);Muscle weakness (generalized) (M62.81);Ataxic gait (R26.0);Other symptoms and signs involving the nervous system (R29.898)     Time: 1751-0258 PT Time Calculation (min) (ACUTE ONLY): 28 min  Charges:  $Gait Training: 8-22 mins $Neuromuscular Re-education: 8-22 mins                     Kittie Plater, PT, DPT Acute Rehabilitation Services Pager #: [phone] Office #: [phone]    Berline Lopes 09/15/2019, 11:49 AM

## 2019-09-15 NOTE — Progress Notes (Signed)
Subjective: Patient reports mild headache and low back pain. He is alert and following commands and answers simple questions appropriately.  He is in no apparent distress.  Objective: Vital signs in last 24 hours: Temp:  [98 F (36.7 C)-99.2 F (37.3 C)] 98 F (36.7 C) (07/10 0838) Pulse Rate:  [61-101] 99 (07/10 0838) Resp:  [14-20] 20 (07/10 0838) BP: (102-118)/(61-75) 118/74 (07/10 0838) SpO2:  [97 %-100 %] 98 % (07/10 0412) Weight:  [67 kg] 67 kg (07/10 0456)  Intake/Output from previous day: 07/09 0701 - 07/10 0700 In: 360 [P.O.:360] Out: 900 [Urine:900] Intake/Output this shift: No intake/output data recorded.  Physical Exam: The patient is alert, able to answer simple questions and follow commands. He is moving all 4 extremities well.    Lab Results: Recent Labs    09/14/19 0607 09/15/19 0228  WBC 16.7* 15.1*  HGB 11.5* 12.4*  HCT 35.0* 37.3*  PLT 603* 627*   BMET Recent Labs    09/14/19 0607 09/15/19 0228  NA 133* 133*  K 3.8 4.1  CL 100 99  CO2 23 25  GLUCOSE 112* 108*  BUN 16 14  CREATININE 0.51* 0.56*  CALCIUM 9.0 8.8*    Studies/Results: DG CHEST PORT 1 VIEW  Result Date: 09/14/2019 CLINICAL DATA:  Leukocytosis EXAM: PORTABLE CHEST 1 VIEW COMPARISON:  Portable exam 0957 hours compared to 09/11/2019 FINDINGS: Normal heart size, mediastinal contours, and pulmonary vascularity. Subsegmental atelectasis RIGHT base. Remaining lungs clear. No pulmonary infiltrate, pleural effusion or pneumothorax. Old healed fracture mid LEFT clavicle. No acute osseous findings. IMPRESSION: Subsegmental atelectasis RIGHT base. Electronically Signed   By: Lavonia Dana M.D.   On: 09/14/2019 11:41    Assessment/Plan: The patient is postop day 10 s/p craniectomy. He is doing well.  He is awaiting rehab placement. Plan to remove the staples next week.  He will need to have his craniotomy flap replaced in a few weeks. Continue working with therapies.    LOS: 10 days     Peggyann Shoals, MD 09/15/2019, 10:55 AM

## 2019-09-16 ENCOUNTER — Inpatient Hospital Stay (HOSPITAL_COMMUNITY): Payer: Medicaid Other

## 2019-09-16 DIAGNOSIS — R5081 Fever presenting with conditions classified elsewhere: Secondary | ICD-10-CM

## 2019-09-16 DIAGNOSIS — R509 Fever, unspecified: Secondary | ICD-10-CM

## 2019-09-16 LAB — GLUCOSE, CAPILLARY
Glucose-Capillary: 113 mg/dL — ABNORMAL HIGH (ref 70–99)
Glucose-Capillary: 88 mg/dL (ref 70–99)
Glucose-Capillary: 109 mg/dL — ABNORMAL HIGH (ref 70–99)

## 2019-09-16 MED ORDER — ACETAMINOPHEN 500 MG PO TABS
1000.0000 mg | ORAL_TABLET | Freq: Four times a day (QID) | ORAL | Status: DC | PRN
  Administered 2019-09-16: 1000 mg

## 2019-09-16 NOTE — Progress Notes (Signed)
Subjective: Patient reports mild bilateral shoulder pain. He is alert and following commands and answers simple questions appropriately. He is in no apparent distress.   Objective: Vital signs in last 24 hours: Temp:  [97.6 F (36.4 C)-98.8 F (37.1 C)] 98.4 F (36.9 C) (07/11 0700) Pulse Rate:  [76-97] 76 (07/11 0332) Resp:  [15-17] 15 (07/11 0332) BP: (100-118)/(66-90) 114/90 (07/11 0700) SpO2:  [97 %-100 %] 100 % (07/11 0332) Weight:  [68.4 kg] 68.4 kg (07/11 0500)  Intake/Output from previous day: 07/10 0701 - 07/11 0700 In: -  Out: 775 [Urine:775] Intake/Output this shift: No intake/output data recorded.  Physical Exam: The patient is alert, able to answer simple questions and follow commands. He is moving all 4 extremities well.    Lab Results: Recent Labs    09/14/19 0607 09/15/19 0228  WBC 16.7* 15.1*  HGB 11.5* 12.4*  HCT 35.0* 37.3*  PLT 603* 627*   BMET Recent Labs    09/14/19 0607 09/15/19 0228  NA 133* 133*  K 3.8 4.1  CL 100 99  CO2 23 25  GLUCOSE 112* 108*  BUN 16 14  CREATININE 0.51* 0.56*  CALCIUM 9.0 8.8*    Studies/Results: DG CHEST PORT 1 VIEW  Result Date: 09/14/2019 CLINICAL DATA:  Leukocytosis EXAM: PORTABLE CHEST 1 VIEW COMPARISON:  Portable exam 0957 hours compared to 09/11/2019 FINDINGS: Normal heart size, mediastinal contours, and pulmonary vascularity. Subsegmental atelectasis RIGHT base. Remaining lungs clear. No pulmonary infiltrate, pleural effusion or pneumothorax. Old healed fracture mid LEFT clavicle. No acute osseous findings. IMPRESSION: Subsegmental atelectasis RIGHT base. Electronically Signed   By: Lavonia Dana M.D.   On: 09/14/2019 11:41    Assessment/Plan: The patient is postop day 11 s/p craniectomy. He is doing well. He is awaiting rehab placement. Plan to remove the staples next week. He will need to have his craniotomy flap replaced in a few weeks. Continue working with therapies.      LOS: 11 days     Peggyann Shoals, MD 09/16/2019, 9:15 AM

## 2019-09-16 NOTE — Progress Notes (Signed)
Interpreter services utilized for shift assessment; [name], interpreter ID [phone]

## 2019-09-16 NOTE — Progress Notes (Signed)
Trauma/Critical Care Follow Up Note  Subjective:    Overnight Issues: none  Objective:  Vital signs for last 24 hours: Temp:  [97.6 F (36.4 C)-98.8 F (37.1 C)] 98.4 F (36.9 C) (07/11 0700) Pulse Rate:  [76-97] 76 (07/11 0332) Resp:  [15-17] 15 (07/11 0332) BP: (100-118)/(66-90) 114/90 (07/11 0700) SpO2:  [97 %-100 %] 100 % (07/11 0332) Weight:  [68.4 kg] 68.4 kg (07/11 0500)  Hemodynamic parameters for last 24 hours:    Intake/Output from previous day: 07/10 0701 - 07/11 0700 In: -  Out: 775 [Urine:775]  Intake/Output this shift: No intake/output data recorded.  Vent settings for last 24 hours:    Physical Exam:  Gen: comfortable, no distress Neuro: non-focal exam, follows commands briskly HEENT: PERRL Neck: supple CV: RRR Pulm: unlabored breathing on RA Abd: soft, NT GU: clear yellow urine, condom cath Extr: wwp, no edema   Results for orders placed or performed during the hospital encounter of 09/05/19 (from the past 24 hour(s))  Glucose, capillary     Status: Abnormal   Collection Time: 09/15/19  1:50 PM  Result Value Ref Range   Glucose-Capillary 130 (H) 70 - 99 mg/dL  Glucose, capillary     Status: Abnormal   Collection Time: 09/15/19  1:57 PM  Result Value Ref Range   Glucose-Capillary 124 (H) 70 - 99 mg/dL  Glucose, capillary     Status: Abnormal   Collection Time: 09/15/19  5:07 PM  Result Value Ref Range   Glucose-Capillary 104 (H) 70 - 99 mg/dL  Glucose, capillary     Status: Abnormal   Collection Time: 09/15/19  8:22 PM  Result Value Ref Range   Glucose-Capillary 144 (H) 70 - 99 mg/dL  Glucose, capillary     Status: Abnormal   Collection Time: 09/15/19 11:12 PM  Result Value Ref Range   Glucose-Capillary 100 (H) 70 - 99 mg/dL  Glucose, capillary     Status: Abnormal   Collection Time: 09/16/19  3:41 AM  Result Value Ref Range   Glucose-Capillary 113 (H) 70 - 99 mg/dL  Glucose, capillary     Status: None   Collection Time: 09/16/19   7:58 AM  Result Value Ref Range   Glucose-Capillary 88 70 - 99 mg/dL    Assessment & Plan:   Present on Admission: . SDH (subdural hematoma) (HCC)    LOS: 11 days   Additional comments:I reviewed the patient's new clinical lab test results.   and I reviewed the patients new imaging test results.    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 Arnoldo Morale 6/30. Bone flap in abdominal wall. Helmet when OOB.  Occipital and clival midline fxs extending into R sphenoid with hemosinus and pneumocephalus- maintain c-collar VDRF- extubated 7/5 L1/2 R TP fxs- pain control Leukocytosis - monitor Urinary retention - foley out 7/4, on urecholine Back pain - lidocaine patch FEN - D3 diet, continue salt tabs, increased to 2g VTE - SCDs, LMWH ID - afebrile, monitor leukocytosis, duplex negative except superficial cephalic v thromb Plan -SDU, therapies, ultimate dispo to [place] Please use [email or web address] to contact on call provider  09/16/2019  *Care during the described time interval was provided by me. I have reviewed this patient's available data, including medical history, events of note, physical examination and test results as part of my evaluation.

## 2019-09-16 NOTE — Progress Notes (Signed)
Bilateral upper and lower extremity venous duplex completed. Refer to "CV Proc" under chart review to view preliminary results.  09/16/2019 9:15 AM Kelby Aline., MHA, RVT, RDCS, RDMS

## 2019-09-17 LAB — BASIC METABOLIC PANEL
Anion gap: 11 (ref 5–15)
BUN: 16 mg/dL (ref 6–20)
CO2: 26 mmol/L (ref 22–32)
Calcium: 9.6 mg/dL (ref 8.9–10.3)
Chloride: 99 mmol/L (ref 98–111)
Creatinine, Ser: 0.57 mg/dL — ABNORMAL LOW (ref 0.61–1.24)
GFR calc Af Amer: >60 mL/min (ref >60)
GFR calc non Af Amer: >60 mL/min (ref >60)
Glucose, Bld: 113 mg/dL — ABNORMAL HIGH (ref 70–99)
Potassium: 4.2 mmol/L (ref 3.5–5.1)
Sodium: 136 mmol/L (ref 135–145)

## 2019-09-17 LAB — CBC
HCT: 37.1 % — ABNORMAL LOW (ref 39.0–52.0)
Hemoglobin: 12.2 g/dL — ABNORMAL LOW (ref 13.0–17.0)
MCH: 30.9 pg (ref 26.0–34.0)
MCHC: 32.9 g/dL (ref 30.0–36.0)
MCV: 93.9 fL (ref 80.0–100.0)
Platelets: 711 10*3/uL — ABNORMAL HIGH (ref 150–400)
RBC: 3.95 MIL/uL — ABNORMAL LOW (ref 4.22–5.81)
RDW: 14.6 % (ref 11.5–15.5)
WBC: 15.2 10*3/uL — ABNORMAL HIGH (ref 4.0–10.5)
nRBC: 0 % (ref 0.0–0.2)

## 2019-09-17 LAB — GLUCOSE, CAPILLARY: Glucose-Capillary: 107 mg/dL — ABNORMAL HIGH (ref 70–99)

## 2019-09-17 MED ORDER — OXYCODONE HCL 5 MG/5ML PO SOLN
5.0000 mg | ORAL | Status: DC | PRN
  Administered 2019-09-17 – 2019-09-18 (×3): 10 mg via ORAL
  Administered 2019-09-19: 5 mg via ORAL
  Administered 2019-09-19: 10 mg via ORAL
  Administered 2019-09-20: 5 mg via ORAL
  Filled 2019-09-17 (×3): qty 10
  Filled 2019-09-17: qty 5
  Filled 2019-09-17: qty 10
  Filled 2019-09-17: qty 5
  Filled 2019-09-17: qty 10

## 2019-09-17 NOTE — Progress Notes (Signed)
Inpatient Rehabilitation Admissions Coordinator  I met with pt's [name] at bedside with interpreter, Rob Bunting. I did clarify that he has filed a workers [employer] as he is the employer. I will contact them to begin approval for a possible CIR admit. Uncle and brother have not confirmed yet who would be the caregiver support needed after any rehab venue. Wife's request for entry into the [place] has been denied.   Danne Baxter, RN, MSN Rehab Admissions Coordinator [phone] 09/17/2019 12:10 PM

## 2019-09-17 NOTE — Progress Notes (Signed)
Shift assessment questions completed with assistance from interpreter services; Idamae Lusher, interpreter ID [phone]

## 2019-09-17 NOTE — Progress Notes (Signed)
[facility] Progress Note  12 Days Post-Op  Subjective: CC-  Family at bedside. Patient is working well with therapies. Following commands and starting to talk more. He reports no complaints. Tolerating diet. BM yesterday.  Objective: Vital signs in last 24 hours: Temp:  [98 F (36.7 C)-98.5 F (36.9 C)] 98 F (36.7 C) (07/12 0722) Pulse Rate:  [79-95] 84 (07/12 0722) Resp:  [16-20] 20 (07/12 0722) BP: (110-117)/(65-76) 117/74 (07/12 0722) SpO2:  [96 %-100 %] 100 % (07/12 0722) Last BM Date: 09/16/19  Intake/Output from previous day: 07/11 0701 - 07/12 0700 In: 360 [P.O.:360] Out: 300 [Urine:300] Intake/Output this shift: No intake/output data recorded.  PE: Gen:  Alert but lethargic, NAD HEENT: R pupil slightly larger than L, reactive to light. Scalp incision cdi with staples intact and no erythema or drainage Card:  RRR, 2+ DP pulses Pulm:  CTAB, no W/R/R, rate and effort normal on room air Abd: Soft, NT/ND, +BS, incision cdi with steri strips in place and no erythema or drainage Ext:  no BUE/BLE edema, calves soft and nontender Neuro: Alert, oriented to self, location ([place]), year. Follows commands with BUE/BLE Skin: warm and dry   Lab Results:  Recent Labs    09/15/19 0228 09/17/19 0412  WBC 15.1* 15.2*  HGB 12.4* 12.2*  HCT 37.3* 37.1*  PLT 627* 711*   BMET Recent Labs    09/15/19 0228 09/17/19 0412  NA 133* 136  K 4.1 4.2  CL 99 99  CO2 25 26  GLUCOSE 108* 113*  BUN 14 16  CREATININE 0.56* 0.57*  CALCIUM 8.8* 9.6   PT/INR No results for input(s): LABPROT, INR in the last 72 hours. CMP     Component Value Date/Time   NA 136 09/17/2019 0412   K 4.2 09/17/2019 0412   CL 99 09/17/2019 0412   CO2 26 09/17/2019 0412   GLUCOSE 113 (H) 09/17/2019 0412   BUN 16 09/17/2019 0412   CREATININE 0.57 (L) 09/17/2019 0412   CALCIUM 9.6 09/17/2019 0412   PROT 5.5 (L) 09/06/2019 0500   ALBUMIN 3.1 (L) 09/06/2019 0500   AST 25  09/06/2019 0500   ALT 16 09/06/2019 0500   ALKPHOS 40 09/06/2019 0500   BILITOT 2.2 (H) 09/06/2019 0500   GFRNONAA >60 09/17/2019 0412   GFRAA >60 09/17/2019 0412   Lipase  No results found for: LIPASE     Studies/Results: VAS [place] LOWER EXTREMITY VENOUS (DVT)  Result Date: 09/17/2019  Lower Venous DVTStudy Indications: Leukocytosis.  Comparison Study: No prior study Performing Technologist: Maudry Mayhew MHA, RDMS, RVT, RDCS  Examination Guidelines: A complete evaluation includes B-mode imaging, spectral Doppler, color Doppler, and power Doppler as needed of all accessible portions of each vessel. Bilateral testing is considered an integral part of a complete examination. Limited examinations for reoccurring indications may be performed as noted. The reflux portion of the exam is performed with the patient in reverse Trendelenburg.  +---------+---------------+---------+-----------+----------+--------------+ RIGHT    CompressibilityPhasicitySpontaneityPropertiesThrombus Aging +---------+---------------+---------+-----------+----------+--------------+ CFV      Full           Yes      Yes                                 +---------+---------------+---------+-----------+----------+--------------+ SFJ      Full                                                        +---------+---------------+---------+-----------+----------+--------------+  FV Prox  Full                                                        +---------+---------------+---------+-----------+----------+--------------+ FV Mid   Full                                                        +---------+---------------+---------+-----------+----------+--------------+ FV DistalFull                                                        +---------+---------------+---------+-----------+----------+--------------+ PFV      Full                                                         +---------+---------------+---------+-----------+----------+--------------+ POP      Full           Yes      Yes                                 +---------+---------------+---------+-----------+----------+--------------+ PTV      Full                                                        +---------+---------------+---------+-----------+----------+--------------+ PERO     Full                                                        +---------+---------------+---------+-----------+----------+--------------+   +---------+---------------+---------+-----------+----------+--------------+ LEFT     CompressibilityPhasicitySpontaneityPropertiesThrombus Aging +---------+---------------+---------+-----------+----------+--------------+ CFV      Full           Yes      Yes                                 +---------+---------------+---------+-----------+----------+--------------+ SFJ      Full                                                        +---------+---------------+---------+-----------+----------+--------------+ FV Prox  Full                                                        +---------+---------------+---------+-----------+----------+--------------+  FV Mid   Full                                                        +---------+---------------+---------+-----------+----------+--------------+ FV DistalFull                                                        +---------+---------------+---------+-----------+----------+--------------+ PFV      Full                                                        +---------+---------------+---------+-----------+----------+--------------+ POP      Full           Yes      Yes                                 +---------+---------------+---------+-----------+----------+--------------+ PTV      Full                                                         +---------+---------------+---------+-----------+----------+--------------+ PERO     Full                                                        +---------+---------------+---------+-----------+----------+--------------+     Summary: RIGHT: - There is no evidence of deep vein thrombosis in the lower extremity.  - No cystic structure found in the popliteal fossa.  LEFT: - There is no evidence of deep vein thrombosis in the lower extremity.  - No cystic structure found in the popliteal fossa.  *See table(s) above for measurements and observations. Electronically signed by Harold Barban MD on 09/17/2019 at 6:33:31 AM.    Final    VAS [place] UPPER EXTREMITY VENOUS DUPLEX  Result Date: 09/17/2019 UPPER VENOUS STUDY  Indications: leukocytosis Limitations: Cervical collar. Comparison Study: No prior study Performing Technologist: Maudry Mayhew MHA, RDMS, RVT, RDCS  Examination Guidelines: A complete evaluation includes B-mode imaging, spectral Doppler, color Doppler, and power Doppler as needed of all accessible portions of each vessel. Bilateral testing is considered an integral part of a complete examination. Limited examinations for reoccurring indications may be performed as noted.  Right Findings: +----------+------------+---------+-----------+----------+--------------+ RIGHT     CompressiblePhasicitySpontaneousProperties   Summary     +----------+------------+---------+-----------+----------+--------------+ IJV                                                 Not visualized +----------+------------+---------+-----------+----------+--------------+ Subclavian    Full  Yes       Yes                             +----------+------------+---------+-----------+----------+--------------+ Axillary      Full       Yes       Yes                             +----------+------------+---------+-----------+----------+--------------+ Brachial      Full       Yes       Yes                              +----------+------------+---------+-----------+----------+--------------+ Radial        Full                                                 +----------+------------+---------+-----------+----------+--------------+ Ulnar         Full                                                 +----------+------------+---------+-----------+----------+--------------+ Cephalic      Full                                                 +----------+------------+---------+-----------+----------+--------------+ Basilic       Full                                                 +----------+------------+---------+-----------+----------+--------------+  Left Findings: +----------+------------+---------+-----------+--------------+-----------------+ LEFT      CompressiblePhasicitySpontaneous  Properties       Summary      +----------+------------+---------+-----------+--------------+-----------------+ IJV                                                      Not visualized   +----------+------------+---------+-----------+--------------+-----------------+ Subclavian    Full       Yes       Yes                                    +----------+------------+---------+-----------+--------------+-----------------+ Axillary      Full       Yes       Yes                                    +----------+------------+---------+-----------+--------------+-----------------+ Brachial      Full       Yes       Yes                                    +----------+------------+---------+-----------+--------------+-----------------+  Radial        Full                                                        +----------+------------+---------+-----------+--------------+-----------------+ Ulnar         Full                                                        +----------+------------+---------+-----------+--------------+-----------------+ Cephalic      None                             spongy    Age Indeterminate                                           w/compression                   +----------+------------+---------+-----------+--------------+-----------------+ Basilic                                                  Not visualized   +----------+------------+---------+-----------+--------------+-----------------+  Summary:  Right: No evidence of deep vein thrombosis involving the visualized veins of the upper extremity. No evidence of superficial vein thrombosis in the upper extremity.  Left: No evidence of deep vein thrombosis involving the visualized veins of the upper extremity. 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  *See table(s) above for measurements and observations.  Diagnosing physician: Harold Barban MD Electronically signed by Harold Barban MD on 09/17/2019 at 6:33:37 AM.    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 Arnoldo Morale 6/30. Bone flap in abdominal wall, will need to have his craniotomy flap replaced in a few weeks. Abdominal staples out, scalp staples remain. Helmet when OOB.  Occipital and clival midline fxs extending into R sphenoid with hemosinus and pneumocephalus- maintain c-collar VDRF- extubated 7/5 and tolerating well L1/2 R TP fxs- pain control, lidocaine patch Leukocytosis - afebrile. CXR with atelectasis, otherwise neg. U/a and Ucx neg. BUE/BLE shows L cephalic vein superficial vein thrombosis and neg for DVT. Surgical incisions cdi. No indication for abx,  continue to monitor Urinary retention - foley out 7/4, on urecholine FEN - D3 diet, continue salt tabs VTE - SCDs,LMWH ID - afebrile, monitor leukocytosis, duplex negative except superficial cephalic v thromb Plan -SDU, therapies, medically stable for discharge to CIR when bed available.   LOS:  12 days    [facility] 09/17/2019, 10:20 AM Please see Amion for pager number during day hours 7:00am-4:30pm

## 2019-09-17 NOTE — Progress Notes (Signed)
Physical Therapy Treatment Patient Details Name: Jason Skinner MRN: [id number] DOB: [birth year] Today's Date: 09/17/2019    History of Present Illness 29yo male who was brought into [facility] earlier today as a level 2 trauma after falling about 20 feet from a ladder on construction site. Pt is found to have B SDH R>L with midline shift and uncal herniation, L temporal contusion and SAH, Occipital and clivial midline fxs extending into R sphenoid with hemosinus and pneumocephalus, respiratory failure, atelectasis, L1-2 TP fxs. Pt was intubated on 6/30. Pt underwent R frontotemporoparietal craniectomy for evacuation of acute subdural hematoma; insertion of right frontal ventriculostomy; insertion of craniotomy flap into the abdominal subcutaneous tissue. Pt extubated on 7/5.    PT Comments    Patient with limited tolerance to standing activity in the bathroom, likely some visual issues looking at mirror and with double vision.  His BP was WNL and symptoms improved in sitting back by the bedside with brother present and supportive.  Noted L lateral leaning standing at sink needing cues and assist to correct.  Continue to feel he will benefit from CIR prior to d/c home.   Follow Up Recommendations  CIR     Equipment Recommendations  Wheelchair (measurements PT);Wheelchair cushion (measurements PT);Hospital bed    Recommendations for Other Services       Precautions / Restrictions Precautions Precautions: Fall Precaution Comments: bone flap in abdomen, helmet when OOB Required Braces or Orthoses: Cervical Brace;Other Brace Cervical Brace: Hard collar;At all times Other Brace: helmet for OOB  Restrictions Weight Bearing Restrictions: No    Mobility  Bed Mobility               General bed mobility comments: pt OOB in recliner  Transfers Overall transfer level: Needs assistance Equipment used: 2 person hand held assist Transfers: Sit to/from Stand Sit to Stand: Min assist;+2 physical  assistance         General transfer comment: MIN A +2 to power into standing, with periods of MOD A for balance during ambulation.  Ambulation/Gait   Gait Distance (Feet): 12 Feet (& 5') Assistive device: 2 person hand held assist Gait Pattern/deviations: Step-to pattern;Decreased stride length;Drifts right/left;Staggering left     General Gait Details: L lateral lean and assist for balance, some dizziness throughout, seemed to worsen as session went on   Stairs             Wheelchair Mobility    Modified Rankin (Stroke Patients Only)       Balance Overall balance assessment: Needs assistance Sitting-balance support: No upper extremity supported;Feet supported Sitting balance-Leahy Scale: Poor     Standing balance support: Bilateral upper extremity supported Standing balance-Leahy Scale: Poor Standing balance comment: standing at sink to wash face needing 1 vs 2 UE support, then wrote his name on the mirror minA of 2 for balance                            Cognition Arousal/Alertness: Awake/alert Behavior During Therapy: Flat affect Overall Cognitive Status: Impaired/Different from baseline Area of Impairment: Attention;Following commands;Safety/judgement;Awareness;Problem solving               [place]o Levels of Cognitive Functioning [place]igos Scales of Cognitive Functioning: Confused/appropriate   Current Attention Level: Sustained   Following Commands: Follows one step commands with increased time;Follows multi-step commands with increased time Safety/Judgement: Decreased awareness of safety;Decreased awareness of deficits Awareness: Intellectual Problem Solving: Slow processing;Difficulty sequencing;Requires  verbal cues General Comments: pt following commands with increased time. able to state location and reports "falling" pt able to write last name on mirror and able to spell out his first name.      Exercises      General  Comments General comments (skin integrity, edema, etc.): Patient limited in standing due to dizziness BP 122/82, standing 142/92.  Brother in room and [place] in to help interpret for pt.      Pertinent Vitals/Pain Pain Assessment: Faces Faces Pain Scale: Hurts little more Pain Location: head above L eye Pain Descriptors / Indicators: Aching;Discomfort Pain Intervention(s): Monitored during session    Home Living                      Prior Function            PT Goals (current goals can now be found in the care plan section) Acute Rehab PT Goals Patient Stated Goal: none stated.  Progress towards PT goals: Progressing toward goals (slowly, limited by diabetes)    Frequency    Min 5X/week      PT Plan Current plan remains appropriate    Co-evaluation PT/OT/SLP Co-Evaluation/Treatment: Yes Reason for Co-Treatment: For patient/therapist safety;Complexity of the patient's impairments (multi-system involvement) PT goals addressed during session: Mobility/safety with mobility;Balance OT goals addressed during session: ADL's and self-care      AM-PAC PT "6 Clicks" Mobility   Outcome Measure  Help needed turning from your back to your side while in a flat bed without using bedrails?: A Little Help needed moving from lying on your back to sitting on the side of a flat bed without using bedrails?: A Lot Help needed moving to and from a bed to a chair (including a wheelchair)?: A Lot Help needed standing up from a chair using your arms (e.g., wheelchair or bedside chair)?: A Lot Help needed to walk in hospital room?: A Lot Help needed climbing 3-5 steps with a railing? : Total 6 Click Score: 12    End of Session Equipment Utilized During Treatment: Gait belt Activity Tolerance: Other (comment) (limited by dizziness) Patient left: in chair;with call bell/phone within reach;with chair alarm set;with family/visitor present   PT Visit Diagnosis: Unsteadiness on feet  (R26.81);Other abnormalities of gait and mobility (R26.89);Muscle weakness (generalized) (M62.81);Ataxic gait (R26.0);Other symptoms and signs involving the nervous system (R29.898)     Time: 1037-1100 PT Time Calculation (min) (ACUTE ONLY): 23 min  Charges:  $Therapeutic Activity: 8-22 mins                     Magda Kiel, PT Acute Rehabilitation Services Pager:[phone] [phone] 09/17/2019    Reginia Naas 09/17/2019, 4:27 PM

## 2019-09-17 NOTE — Progress Notes (Signed)
Occupational Therapy Treatment Patient Details Name: Jason Skinner MRN: [id number] DOB: 05-Jul-1990 Today's Date: 09/17/2019    History of present illness 29yo male who was brought into [facility] earlier today as a level 2 trauma after falling about 20 feet from a ladder on construction site. Pt is found to have B SDH R>L with midline shift and uncal herniation, L temporal contusion and SAH, Occipital and clivial midline fxs extending into R sphenoid with hemosinus and pneumocephalus, respiratory failure, atelectasis, L1-2 TP fxs. Pt was intubated on 6/30. Pt underwent R frontotemporoparietal craniectomy for evacuation of acute subdural hematoma; insertion of right frontal ventriculostomy; insertion of craniotomy flap into the abdominal subcutaneous tissue. Pt extubated on 7/5.   OT comments  Pt seen in conjunction with PT to maximize activity tolerance and participation. Pt limited by dizziness this session needing to sit during standing ADLs. Pt with improved ideational apraxia able to wash face at sink with tactile and verbal cues to initiate task. Pt able to write last name on mirror and able to spell first name. Pt talking more ( one word answers) and gesturing appropriately. Overall, pt requires MIN- MOD hand held assist +2 for functional mobility d/t balance impairments. Pt with reports of double vision during session continuing to present with ptosis in R eye. DC plan remains appropriate, will follow acutely per POC.   Follow Up Recommendations  CIR    Equipment Recommendations  None recommended by OT    Recommendations for Other Services      Precautions / Restrictions Precautions Precautions: Fall Precaution Comments: bone flap in abdomen, helmet when OOB Required Braces or Orthoses: Cervical Brace;Other Brace Cervical Brace: Hard collar;At all times Other Brace: helmet for OOB  Restrictions Weight Bearing Restrictions: No       Mobility Bed Mobility               General bed  mobility comments: pt OOB in recliner  Transfers Overall transfer level: Needs assistance Equipment used: 2 person hand held assist Transfers: Sit to/from Stand Sit to Stand: Min assist;+2 physical assistance         General transfer comment: MIN A +2 to power into standing, with periods of MOD A for balance during ambulation.    Balance Overall balance assessment: Needs assistance Sitting-balance support: No upper extremity supported;Feet supported Sitting balance-Leahy Scale: Poor     Standing balance support: Bilateral upper extremity supported Standing balance-Leahy Scale: Poor Standing balance comment: reliant on BUE support                           ADL either performed or assessed with clinical judgement   ADL Overall ADL's : Needs assistance/impaired     Grooming: Wash/dry face;Standing;Supervision/safety;Set up Grooming Details (indicate cue type and reason): tactile cues needed to initiate task from standing at sink. pt with improved ideational apraxia. pt reports dizziness upon standing at sink needing to sit on [facility]                 Toilet Transfer: Minimal assistance;+2 for safety/equipment;+2 for physical assistance;Ambulation Toilet Transfer Details (indicate cue type and reason): simulated via functional mobility, mobility limited d/t reports of dizziness         Functional mobility during ADLs: Minimal assistance;Moderate assistance;+2 for physical assistance;+2 for safety/equipment General ADL Comments: noted improvement with ideational apraxia able to wash face at sink, limited by dizziness this session. pt gesturing and talking more  Vision   Additional Comments: Ptosis present Rt eye. disconjugate gaze noted. pt reports double vision on L side   Perception     Praxis      Cognition Arousal/Alertness: Awake/alert Behavior During Therapy: Flat affect Overall Cognitive Status: Impaired/Different from baseline Area of  Impairment: Attention;Following commands;Safety/judgement;Awareness;Problem solving               Rancho Levels of Cognitive Functioning Rancho Los Amigos Scales of Cognitive Functioning: Confused/appropriate   Current Attention Level: Sustained   Following Commands: Follows one step commands with increased time;Follows multi-step commands with increased time Safety/Judgement: Decreased awareness of safety;Decreased awareness of deficits Awareness: Intellectual Problem Solving: Slow processing;Difficulty sequencing;Requires verbal cues General Comments: pt following commands with increased time. able to state location and reports "falling" pt able to write last name on mirror and able to spell out his first name.        Exercises     Shoulder Instructions       General Comments pt reports dizziness duirng mobility. BP pre mobility 122/82, BP in standing after pt reporting feeling dizzy 142/92    Pertinent Vitals/ Pain       Pain Assessment: Faces Faces Pain Scale: Hurts little more Pain Location: head above L eye Pain Descriptors / Indicators: Aching;Discomfort Pain Intervention(s): Monitored during session  Home Living                                          Prior Functioning/Environment              Frequency  Min 3X/week        Progress Toward Goals  OT Goals(current goals can now be found in the care plan section)  Progress towards OT goals: Progressing toward goals  Acute Rehab OT Goals Patient Stated Goal: none stated.  OT Goal Formulation: Patient unable to participate in goal setting Time For Goal Achievement: 09/26/19 Potential to Achieve Goals: Good  Plan Discharge plan remains appropriate;Frequency remains appropriate    Co-evaluation    PT/OT/SLP Co-Evaluation/Treatment: Yes Reason for Co-Treatment: For patient/therapist safety;Complexity of the patient's impairments (multi-system involvement);To address functional/ADL  transfers;Necessary to address cognition/behavior during functional activity   OT goals addressed during session: ADL's and self-care      AM-PAC OT "6 Clicks" Daily Activity     Outcome Measure   Help from another person eating meals?: A Lot Help from another person taking care of personal grooming?: A Lot Help from another person toileting, which includes using toliet, bedpan, or urinal?: A Lot Help from another person bathing (including washing, rinsing, drying)?: A Lot Help from another person to put on and taking off regular upper body clothing?: A Lot Help from another person to put on and taking off regular lower body clothing?: Total 6 Click Score: 11    End of Session Equipment Utilized During Treatment: Gait belt;Other (comment);Cervical collar (helmet)  OT Visit Diagnosis: Unsteadiness on feet (R26.81);Muscle weakness (generalized) (M62.81)   Activity Tolerance Patient tolerated treatment well   Patient Left in chair;with call bell/phone within reach;with chair alarm set   Nurse Communication Mobility status        Time: 1037-1100 OT Time Calculation (min): 23 min  Charges: OT General Charges $OT Visit: 1 Visit OT Treatments $Self Care/Home Management : 8-22 mins  Lanier Clam., COTA/L Acute Rehabilitation Services [phone] [place]  09/17/2019, 1:27 PM

## 2019-09-18 MED ORDER — METHOCARBAMOL 500 MG PO TABS
500.0000 mg | ORAL_TABLET | Freq: Three times a day (TID) | ORAL | Status: DC
  Administered 2019-09-18 – 2019-09-20 (×9): 500 mg via ORAL
  Filled 2019-09-18 (×8): qty 1

## 2019-09-18 NOTE — Progress Notes (Addendum)
13 Days Post-Op  Subjective: CC: Brother at bedside. Interpreter used. Patient is A&O x 4. Follows commands x 4 extremities. Notes some area of pain in his lower back. Lidocaine patch on currently. Up in chair. C-Collar replaced.   Objective: Vital signs in last 24 hours: Temp:  [98 F (36.7 C)-100.3 F (37.9 C)] 99.1 F (37.3 C) (07/13 0755) Pulse Rate:  [77-120] 78 (07/13 0755) Resp:  [16-20] 16 (07/13 0755) BP: (112-151)/(71-79) 116/79 (07/13 0755) SpO2:  [96 %-100 %] 99 % (07/13 0755) Last BM Date: 09/16/19  Intake/Output from previous day: 07/12 0701 - 07/13 0700 In: 1080 [P.O.:1080] Out: 100 [Urine:100] Intake/Output this shift: No intake/output data recorded.  PE: Gen: Alert but lethargic, NAD HEENT:R pupil slightly larger than L, reactive to light. Scalp incision cdi with staples intact and no erythema or drainage. Helmet in place  Neck: C-Collar in place Card:RRR, 2+ DP pulses Pulm: CTAB, no W/R/R, rate and effort normal on room air Abd: Soft, NT/ND, +BS, incision cdi with steri strips in place and no erythema or drainage Ext: no BUE/BLE edema, calves soft and nontender Neuro: Alert, oriented to self, location ([place]), year, and situation. Follows commands with BUE/BLE Skin: warm and dry    Lab Results:  Recent Labs    09/17/19 0412  WBC 15.2*  HGB 12.2*  HCT 37.1*  PLT 711*   BMET Recent Labs    09/17/19 0412  NA 136  K 4.2  CL 99  CO2 26  GLUCOSE 113*  BUN 16  CREATININE 0.57*  CALCIUM 9.6   PT/INR No results for input(s): LABPROT, INR in the last 72 hours. CMP     Component Value Date/Time   NA 136 09/17/2019 0412   K 4.2 09/17/2019 0412   CL 99 09/17/2019 0412   CO2 26 09/17/2019 0412   GLUCOSE 113 (H) 09/17/2019 0412   BUN 16 09/17/2019 0412   CREATININE 0.57 (L) 09/17/2019 0412   CALCIUM 9.6 09/17/2019 0412   PROT 5.5 (L) 09/06/2019 0500   ALBUMIN 3.1 (L) 09/06/2019 0500   AST 25 09/06/2019 0500   ALT 16  09/06/2019 0500   ALKPHOS 40 09/06/2019 0500   BILITOT 2.2 (H) 09/06/2019 0500   GFRNONAA >60 09/17/2019 0412   GFRAA >60 09/17/2019 0412   Lipase  No results found for: LIPASE     Studies/Results: No results found.  Anti-infectives: Anti-infectives (From admission, onward)   Start     Dose/Rate Route Frequency Ordered Stop   09/05/19 1250  bacitracin 50,000 Units in sodium chloride 0.9 % 500 mL irrigation  Status:  Discontinued          As needed 09/05/19 1250 09/05/19 1414       Assessment/Plan Fall B SDH R>L with midline shift and uncal herniation, L temporal contusion and SAH-s/pdecompressive R craniectomy and placement L EVD by Dr. Arnoldo Morale 6/30. Bone flap in abdominal wall, will need to have his craniotomy flap replaced when his craniotomy flap becomes a bit sunken per NSGY notes. Abdominal staples out, scalp staples remain. Helmet when OOB.  Occipital and clival midline fxs extending into R sphenoid with hemosinus and pneumocephalus- maintain c-collar VDRF- extubated 7/5 and tolerating well L1/2 R TP fxs- pain control, lidocaine patch Leukocytosis - afebrile. T max 100.3. CXR with atelectasis, otherwise neg. U/a and Ucx neg. BUE/BLE shows L cephalic vein superficial vein thrombosis and neg for DVT. Surgical incisions cdi. No indication for abx, continue to monitor Urinary retention - foley  out 7/4, on urecholine FEN - D3 diet, continue salt tabs VTE - SCDs,LMWH ID- afebrile, monitor leukocytosis, duplex negative except superficial cephalic v thromb Plan -SDU, therapies, medically stable for discharge to CIR when bed available. Patients brother states that after CIR patient will have family (him and uncle) that plan to divide shifts to be with him 24/7.     LOS: 13 days    Jillyn Ledger , [facility] Surgery 09/18/2019, 10:19 AM Please see Amion for pager number during day hours 7:00am-4:30pm

## 2019-09-18 NOTE — Progress Notes (Signed)
Physical Therapy Treatment Patient Details Name: Jason Skinner MRN: [id number] DOB: [birth year] Today's Date: 09/18/2019    History of Present Illness 29yo male who was brought into [facility] earlier today as a level 2 trauma after falling about 20 feet from a ladder on construction site. Pt is found to have B SDH R>L with midline shift and uncal herniation, L temporal contusion and SAH, Occipital and clivial midline fxs extending into R sphenoid with hemosinus and pneumocephalus, respiratory failure, atelectasis, L1-2 TP fxs. Pt was intubated on 6/30. Pt underwent R frontotemporoparietal craniectomy for evacuation of acute subdural hematoma; insertion of right frontal ventriculostomy; insertion of craniotomy flap into the abdominal subcutaneous tissue. Pt extubated on 7/5.    PT Comments    Patient progressing today able to ambulate in hallway and used RW with A as well as to utilize visual target to help with dizzy symptoms.  He also will benefit from using an eye patch to improve focus without diplopia.  Patient remains appropriate for CIR level rehab upon d/c. PT to follow.    Follow Up Recommendations  CIR     Equipment Recommendations  Wheelchair (measurements PT);Wheelchair cushion (measurements PT);Hospital bed    Recommendations for Other Services       Precautions / Restrictions Precautions Precautions: Fall Precaution Comments: bone flap in abdomen, helmet when OOB Required Braces or Orthoses: Cervical Brace;Other Brace Cervical Brace: Hard collar;At all times    Mobility  Bed Mobility Overal bed mobility: Needs Assistance Bed Mobility: Rolling;Sidelying to Sit Rolling: Min guard Sidelying to sit: Min assist       General bed mobility comments: cues for technique, assist for trunk, increased time scooting to EOB  Transfers Overall transfer level: Needs assistance Equipment used: Rolling walker (2 wheeled) Transfers: Sit to/from Stand Sit to Stand: Min assist;+2 physical  assistance         General transfer comment: assist due to posteiror bias initially upon standing  Ambulation/Gait Ambulation/Gait assistance: Mod assist;+2 safety/equipment Gait Distance (Feet): 90 Feet Assistive device: Rolling walker (2 wheeled) Gait Pattern/deviations: Step-through pattern;Decreased stride length;Staggering right;Drifts right/left     General Gait Details: facilitation at L hip and R shoulder for upright posture, assist for walker management and cues throughout for visual target for compensation.  +2 for safety due to balance and limited session prior due to dizziness   Stairs             Wheelchair Mobility    Modified Rankin (Stroke Patients Only)       Balance Overall balance assessment: Needs assistance   Sitting balance-Leahy Scale: Fair Sitting balance - Comments: EOB with S this session prior to standing Postural control: Right lateral lean Standing balance support: Bilateral upper extremity supported Standing balance-Leahy Scale: Poor Standing balance comment: UE support for balance, noted tendency to lean to R, assist with facilitation through L pelvis and R shoulder                            Cognition Arousal/Alertness: Awake/alert Behavior During Therapy: Flat affect Overall Cognitive Status: Impaired/Different from baseline Area of Impairment: Attention;Following commands;Safety/judgement;Awareness;Problem solving               Rancho Levels of Cognitive Functioning Rancho Los Amigos Scales of Cognitive Functioning: Confused/appropriate   Current Attention Level: Sustained Memory: Decreased short-term memory Following Commands: Follows one step commands with increased time;Follows multi-step commands with increased time Safety/Judgement: Decreased awareness of safety;Decreased awareness of  deficits Awareness: Intellectual Problem Solving: Slow processing;Difficulty sequencing;Requires verbal cues General  Comments: following commands for in bed therex and cues for visual target during ambulation      Exercises Other Exercises Other Exercises: performed supine heel sides and trunk rotation x 10 each for mobilizing lumbar spine prior to ambulation    General Comments General comments (skin integrity, edema, etc.): Brother, (pt states his name is Ross) in the room and stayed close throughout session with pt's wife on face time; trauma MD in at end of session reports plans for CIR when approved.  Graciella present to interpret      Pertinent Vitals/Pain Pain Score: 5  Pain Location: head Pain Descriptors / Indicators: Aching;Discomfort Pain Intervention(s): Monitored during session;Repositioned    Home Living                      Prior Function            PT Goals (current goals can now be found in the care plan section) Progress towards PT goals: Progressing toward goals    Frequency    Min 5X/week      PT Plan Current plan remains appropriate    Co-evaluation              AM-PAC PT "6 Clicks" Mobility   Outcome Measure  Help needed turning from your back to your side while in a flat bed without using bedrails?: A Little Help needed moving from lying on your back to sitting on the side of a flat bed without using bedrails?: A Little Help needed moving to and from a bed to a chair (including a wheelchair)?: A Little Help needed standing up from a chair using your arms (e.g., wheelchair or bedside chair)?: A Lot Help needed to walk in hospital room?: A Lot Help needed climbing 3-5 steps with a railing? : Total 6 Click Score: 14    End of Session   Activity Tolerance: Patient tolerated treatment well Patient left: in chair;with call bell/phone within reach;with family/visitor present;with chair alarm set   PT Visit Diagnosis: Unsteadiness on feet (R26.81);Other abnormalities of gait and mobility (R26.89);Muscle weakness (generalized) (M62.81);Ataxic gait  (R26.0);Other symptoms and signs involving the nervous system (R29.898)     Time: 6767-2094 PT Time Calculation (min) (ACUTE ONLY): 20 min  Charges:  $Gait Training: 8-22 mins                     Magda Kiel, PT Acute Rehabilitation Services Pager:[phone] [phone] 09/18/2019    Reginia Naas 09/18/2019, 2:03 PM

## 2019-09-18 NOTE — Progress Notes (Signed)
Patient continues to remove c-collar multiple times after RN replaced it.

## 2019-09-18 NOTE — Progress Notes (Signed)
RN has entered room twice to find patient without c-collar on. C-collar replaced each time. Will continue to monitor

## 2019-09-18 NOTE — Progress Notes (Signed)
Subjective: The patient is alert and in no apparent distress.  Objective: Vital signs in last 24 hours: Temp:  [98 F (36.7 C)-100.3 F (37.9 C)] 98.6 F (37 C) (07/13 0322) Pulse Rate:  [77-120] 77 (07/13 0322) Resp:  [16-20] 16 (07/13 0322) BP: (112-151)/(71-78) 115/76 (07/13 0322) SpO2:  [96 %-100 %] 97 % (07/13 0322) Estimated body mass index is 21.64 kg/m as calculated from the following:   Height as of this encounter: 5\' 10"  (1.778 m).   Weight as of this encounter: 68.4 kg.   Intake/Output from previous day: 07/12 0701 - 07/13 0700 In: 1080 [P.O.:1080] Out: 100 [Urine:100] Intake/Output this shift: No intake/output data recorded.  Physical exam the patient is alert in no apparent distress.  He is moving all 4 extremities well.  His craniotomy and abdominal incision are healing well.  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  He follows commands and answer simple questions such as he knows he is in [place].  The patient's scalp is still soft but full.  Lab Results: Recent Labs    09/17/19 0412  WBC 15.2*  HGB 12.2*  HCT 37.1*  PLT 711*   BMET Recent Labs    09/17/19 0412  NA 136  K 4.2  CL 99  CO2 26  GLUCOSE 113*  BUN 16  CREATININE 0.57*  CALCIUM 9.6    Studies/Results: VAS [place] LOWER EXTREMITY VENOUS (DVT)  Result Date: 09/17/2019  Lower Venous DVTStudy Indications: Leukocytosis.  Comparison Study: No prior study Performing Technologist: Maudry Mayhew MHA, RDMS, RVT, RDCS  Examination Guidelines: A complete evaluation includes B-mode imaging, spectral Doppler, color Doppler, and power Doppler as needed of all accessible portions of each vessel. Bilateral testing is considered an integral part of a complete examination. Limited examinations for reoccurring indications may be performed as noted. The reflux portion of the exam is performed with the patient in reverse Trendelenburg.   +---------+---------------+---------+-----------+----------+--------------+ RIGHT    CompressibilityPhasicitySpontaneityPropertiesThrombus Aging +---------+---------------+---------+-----------+----------+--------------+ CFV      Full           Yes      Yes                                 +---------+---------------+---------+-----------+----------+--------------+ SFJ      Full                                                        +---------+---------------+---------+-----------+----------+--------------+ FV Prox  Full                                                        +---------+---------------+---------+-----------+----------+--------------+ FV Mid   Full                                                        +---------+---------------+---------+-----------+----------+--------------+ FV DistalFull                                                        +---------+---------------+---------+-----------+----------+--------------+  PFV      Full                                                        +---------+---------------+---------+-----------+----------+--------------+ POP      Full           Yes      Yes                                 +---------+---------------+---------+-----------+----------+--------------+ PTV      Full                                                        +---------+---------------+---------+-----------+----------+--------------+ PERO     Full                                                        +---------+---------------+---------+-----------+----------+--------------+   +---------+---------------+---------+-----------+----------+--------------+ LEFT     CompressibilityPhasicitySpontaneityPropertiesThrombus Aging +---------+---------------+---------+-----------+----------+--------------+ CFV      Full           Yes      Yes                                  +---------+---------------+---------+-----------+----------+--------------+ SFJ      Full                                                        +---------+---------------+---------+-----------+----------+--------------+ FV Prox  Full                                                        +---------+---------------+---------+-----------+----------+--------------+ FV Mid   Full                                                        +---------+---------------+---------+-----------+----------+--------------+ FV DistalFull                                                        +---------+---------------+---------+-----------+----------+--------------+ PFV      Full                                                        +---------+---------------+---------+-----------+----------+--------------+   POP      Full           Yes      Yes                                 +---------+---------------+---------+-----------+----------+--------------+ PTV      Full                                                        +---------+---------------+---------+-----------+----------+--------------+ PERO     Full                                                        +---------+---------------+---------+-----------+----------+--------------+     Summary: RIGHT: - There is no evidence of deep vein thrombosis in the lower extremity.  - No cystic structure found in the popliteal fossa.  LEFT: - There is no evidence of deep vein thrombosis in the lower extremity.  - No cystic structure found in the popliteal fossa.  *See table(s) above for measurements and observations. Electronically signed by Harold Barban MD on 09/17/2019 at 6:33:31 AM.    Final    VAS [place] UPPER EXTREMITY VENOUS DUPLEX  Result Date: 09/17/2019 UPPER VENOUS STUDY  Indications: leukocytosis Limitations: Cervical collar. Comparison Study: No prior study Performing Technologist: Maudry Mayhew MHA, RDMS, RVT, RDCS   Examination Guidelines: A complete evaluation includes B-mode imaging, spectral Doppler, color Doppler, and power Doppler as needed of all accessible portions of each vessel. Bilateral testing is considered an integral part of a complete examination. Limited examinations for reoccurring indications may be performed as noted.  Right Findings: +----------+------------+---------+-----------+----------+--------------+ RIGHT     CompressiblePhasicitySpontaneousProperties   Summary     +----------+------------+---------+-----------+----------+--------------+ IJV                                                 Not visualized +----------+------------+---------+-----------+----------+--------------+ Subclavian    Full       Yes       Yes                             +----------+------------+---------+-----------+----------+--------------+ Axillary      Full       Yes       Yes                             +----------+------------+---------+-----------+----------+--------------+ Brachial      Full       Yes       Yes                             +----------+------------+---------+-----------+----------+--------------+ Radial        Full                                                 +----------+------------+---------+-----------+----------+--------------+  Ulnar         Full                                                 +----------+------------+---------+-----------+----------+--------------+ Cephalic      Full                                                 +----------+------------+---------+-----------+----------+--------------+ Basilic       Full                                                 +----------+------------+---------+-----------+----------+--------------+  Left Findings: +----------+------------+---------+-----------+--------------+-----------------+ LEFT      CompressiblePhasicitySpontaneous  Properties       Summary       +----------+------------+---------+-----------+--------------+-----------------+ IJV                                                      Not visualized   +----------+------------+---------+-----------+--------------+-----------------+ Subclavian    Full       Yes       Yes                                    +----------+------------+---------+-----------+--------------+-----------------+ Axillary      Full       Yes       Yes                                    +----------+------------+---------+-----------+--------------+-----------------+ Brachial      Full       Yes       Yes                                    +----------+------------+---------+-----------+--------------+-----------------+ Radial        Full                                                        +----------+------------+---------+-----------+--------------+-----------------+ Ulnar         Full                                                        +----------+------------+---------+-----------+--------------+-----------------+ Cephalic      None                            spongy    Age Indeterminate  w/compression                   +----------+------------+---------+-----------+--------------+-----------------+ Basilic                                                  Not visualized   +----------+------------+---------+-----------+--------------+-----------------+  Summary:  Right: No evidence of deep vein thrombosis involving the visualized veins of the upper extremity. No evidence of superficial vein thrombosis in the upper extremity.  Left: No evidence of deep vein thrombosis involving the visualized veins of the upper extremity. 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  *See table(s) above for measurements and observations.  Diagnosing physician: Harold Barban MD Electronically  signed by Harold Barban MD on 09/17/2019 at 6:33:37 AM.    Final     Assessment/Plan: Postop day #13: The patient is doing very well all things considered.  It looks like he will need rehab.  I will plan to replace his craniotomy flap when his craniotomy wound becomes a bit sunken.  LOS: 13 days     Ophelia Charter 09/18/2019, 7:32 AM

## 2019-09-18 NOTE — Progress Notes (Addendum)
Inpatient Rehabilitation Admissions Coordinator  I am working on  possible workers compensation with pt's employer and insurer along with family friend, Anderson Malta at [phone].  Employer [employer] Policy # [id number]  Insurer is [employer] [place], RN, MSN Rehab Admissions Coordinator [phone] 09/18/2019 12:18 PM

## 2019-09-18 NOTE — H&P (Signed)
Physical Medicine and Rehabilitation Admission H&P     HPI: Jason Skinner is a 29 year old right-handed limited English speaking male on no prescription medications.  Per report lives with friends.  He does not drive.  Mobile home with 4 steps to entry.  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  He has been in the [place] for approximately 3 years and has family including a spouse in [place].  He does have a brother and uncle in the local area.  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  Noted positive loss of consciousness.  Cranial CT scan showed right more than left cerebral convexity subdural hematoma with leftward midline shift of 2 cm and a right uncal herniation.  Left temporal pole contusion with overlying subarachnoid hemorrhage.  Occipital and clival midline fractures extending into the right sphenoid sinus with pneumocephalus.  CT of the chest abdomen pelvis showed an L1-L2 right transverse process fracture as well as incidental findings of a 2 cm nodular projection from the right thyroid lobe.  Admission chemistries potassium 3.1 alcohol negative lactic acid 3.1, SARS coronavirus negative.  Patient underwent right frontal temporal parietal craniectomy for evacuation of acute subdural hematoma and insertion of right frontal ventriculostomy insertion of craniotomy flap into the abdominal subcutaneous tissue 09/05/2019 per Dr. Arnoldo Morale.  Latest follow-up cranial CT scan showed decreased mass-effect of 5 mm leftward midline shift as previously 20 mm.  Conservative care of L1/2 right transverse process fractures.  Patient remained intubated through 09/10/2019.  Bilateral lower extremity Dopplers negative for DVT and upper extremities negative for DVT there was an anterior determinant superficial vein thrombosis involving the left cephalic vein.  He was cleared to begin Lovenox for DVT prophylaxis 09/09/2019.  His  diet has been advanced to mechanical soft thin liquids.  Bouts of urinary retention placed on Urecholine.  Mild leukocytosis 14,300and monitored.  Mild hyponatremia 131 improved to 136 sodium chloride tablets discontinued.  Therapy evaluations completed patient is wearing a helmet when out of bed .  Patient was admitted for a comprehensive rehab program.  Review of Systems  Unable to perform ROS: Language   No past medical history on file.   The histories are not reviewed yet. Please review them in the "History" navigator section and refresh this [name]. No family history on file. Social History:  has no history on file for tobacco use, alcohol use, and drug use. Allergies: No Known Allergies Medications Prior to Admission  Medication Sig Dispense Refill  . acetaminophen (TYLENOL) 500 MG tablet Take 500 mg by mouth every 6 (six) hours as needed for mild pain.      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 He clean clothes at laundry mat, goes shopping to stores, to goes to [place], rides bike to store because he does not have a car, He has been in [place] 3 years and family in [place]. Brother is here.   Lives With: Friend(s)   Functional History: Prior Function Level of Independence: Independent Comments: wife and 2 daughters  in [place]  Functional Status:  Mobility: Bed Mobility Overal bed mobility: Needs Assistance Bed Mobility: Rolling, Sidelying to  Sit Rolling: Min guard Sidelying to sit: Mod assist Supine to sit: Mod assist Sit to supine: Mod assist, +2 for physical assistance General bed mobility comments: pt OOB in recliner Transfers Overall transfer level: Needs assistance Equipment used: 2 person hand held assist Transfers: Sit to/from Stand Sit to Stand: Min assist, +2 physical assistance Stand pivot transfers: Mod assist General transfer comment: MIN A +2 to power into standing, with periods of MOD A for balance during ambulation. Ambulation/Gait Ambulation/Gait assistance: Max assist, +2 physical assistance, +2 safety/equipment Gait Distance (Feet): 12 Feet (& 5') Assistive device: 2 person hand held assist Gait Pattern/deviations: Step-to pattern, Decreased stride length, Drifts right/left, Staggering left General Gait Details: L lateral lean and assist for balance, some dizziness throughout, seemed to worsen as session went on Gait velocity: reduced Gait velocity interpretation: <1.8 ft/sec, indicate of risk for recurrent falls    ADL: ADL Overall ADL's : Needs assistance/impaired Eating/Feeding: Moderate assistance, Sitting Eating/Feeding Details (indicate cue type and reason): pt undershooting when self feeding. pt using L hand instead of right initially.  Grooming: Wash/dry face, Standing, Supervision/safety, Set up Grooming Details (indicate cue type and reason): tactile cues needed to initiate task from standing at sink. pt with improved ideational apraxia. pt reports dizziness upon standing at sink needing to sit on BSC Upper Body Bathing: Maximal assistance Lower Body Bathing: Total assistance Upper Body Dressing : Maximal assistance Lower Body Dressing: Total assistance Toilet Transfer: Minimal assistance, +2 for safety/equipment, +2 for physical assistance, Ambulation Toilet Transfer Details (indicate cue type and reason): simulated via functional mobility, mobility limited d/t reports of  dizziness Toileting- Clothing Manipulation and Hygiene: Maximal assistance, Sit to/from stand Toileting - Clothing Manipulation Details (indicate cue type and reason): Pt incontinent of urine.  He was able to assist with peri care, but demonstrates decreased thoroughness  Functional mobility during ADLs: Minimal assistance, Moderate assistance, +2 for physical assistance, +2 for safety/equipment General ADL Comments: noted improvement with ideational apraxia able to wash face at sink, limited by dizziness this session. pt gesturing and talking more  Cognition: Cognition Overall Cognitive Status: Impaired/Different from baseline Arousal/Alertness: Awake/alert Orientation Level: Oriented X4 Attention: Focused, Sustained Focused Attention: Appears intact Sustained Attention: Appears intact Memory: Impaired Memory Impairment: Storage deficit, Decreased short term memory, Decreased recall of new information Decreased Short Term Memory: Verbal basic Awareness: Impaired Awareness Impairment: Intellectual impairment, Emergent impairment Problem Solving: Impaired Problem Solving Impairment: Verbal basic Executive Function: Reasoning, Initiating, Self Monitoring Reasoning: Impaired Reasoning Impairment: Verbal basic Initiating: Impaired Initiating Impairment: Verbal basic, Functional basic Self Monitoring: Impaired Self Monitoring Impairment: Verbal basic, Functional basic [employer] Scales of Cognitive Functioning: Confused/appropriate Cognition Arousal/Alertness: Awake/alert Behavior During Therapy: Flat affect Overall Cognitive Status: Impaired/Different from baseline Area of Impairment: Attention, Following commands, Safety/judgement, Awareness, Problem solving Orientation Level: Disoriented to, Time, Place, Situation Current Attention Level: Sustained Memory: Decreased short-term memory Following Commands: Follows one step commands with increased time, Follows multi-step  commands with increased time Safety/Judgement: Decreased awareness of safety, Decreased awareness of deficits Awareness: Intellectual Problem Solving: Slow processing, Difficulty sequencing, Requires verbal cues General Comments: pt following commands with increased time. able to state location and reports "falling" pt able to write last name on mirror and able to spell out his first name.  Physical Exam: Blood pressure 115/76, pulse 77, temperature 98.6 F (37 C), temperature source Axillary, resp. rate 16,  height 5\' 10"  (1.778 m), weight 68.4 kg, SpO2 97 %. Physical Exam Constitutional:      General: He is not in acute distress. HENT:     Right Ear: External ear normal.     Left Ear: External ear normal.     Nose: Nose normal.     Mouth/Throat:     Pharynx: No oropharyngeal exudate or posterior oropharyngeal erythema.  Eyes:     Conjunctiva/sclera: Conjunctivae normal.  Neck:     Comments: [place] collar in place Cardiovascular:     Rate and Rhythm: Normal rate and regular rhythm.  Pulmonary:     Effort: Pulmonary effort is normal. No respiratory distress.     Breath sounds: No wheezing or rales.  Abdominal:     General: Bowel sounds are normal.     Tenderness: There is abdominal tenderness.  Musculoskeletal:        General: Tenderness present.  Skin:    Comments: Abdominal surgical site clean and dry staples have been removed.  Craniectomy site clean and dry with staples intact  Neurological:     Comments: Resting but arose easily with tactile stim. Oriented to person and hospital. Followed basic commands with verbal and tactile cues. Mild cn III weakness on right. Moves both arms and legs spontaneously. Did not consistently participate in MMT. Senses pain in all 4's. No resting tone.   Psychiatric:     Comments: flat     Results for orders placed or performed during the hospital encounter of 09/05/19 (from the past 48 hour(s))  Glucose, capillary     Status: None    Collection Time: 09/16/19  7:58 AM  Result Value Ref Range   Glucose-Capillary 88 70 - 99 mg/dL    Comment: Glucose reference range applies only to samples taken after fasting for at least 8 hours.  Glucose, capillary     Status: Abnormal   Collection Time: 09/16/19  8:43 PM  Result Value Ref Range   Glucose-Capillary 109 (H) 70 - 99 mg/dL    Comment: Glucose reference range applies only to samples taken after fasting for at least 8 hours.  Basic metabolic panel     Status: Abnormal   Collection Time: 09/17/19  4:12 AM  Result Value Ref Range   Sodium 136 135 - 145 mmol/L   Potassium 4.2 3.5 - 5.1 mmol/L   Chloride 99 98 - 111 mmol/L   CO2 26 22 - 32 mmol/L   Glucose, Bld 113 (H) 70 - 99 mg/dL    Comment: Glucose reference range applies only to samples taken after fasting for at least 8 hours.   BUN 16 6 - 20 mg/dL   Creatinine, Ser 0.57 (L) 0.61 - 1.24 mg/dL   Calcium 9.6 8.9 - 10.3 mg/dL   GFR calc non Af Amer >60 >60 mL/min   GFR calc Af Amer >60 >60 mL/min   Anion gap 11 5 - 15    Comment: Performed at [facility] [address] [place], [place] [address]  CBC     Status: Abnormal   Collection Time: 09/17/19  4:12 AM  Result Value Ref Range   WBC 15.2 (H) 4.0 - 10.5 K/uL   RBC 3.95 (L) 4.22 - 5.81 MIL/uL   Hemoglobin 12.2 (L) 13.0 - 17.0 g/dL   HCT 37.1 (L) 39 - 52 %   MCV 93.9 80.0 - 100.0 fL   MCH 30.9 26.0 - 34.0 pg   MCHC 32.9 30.0 - 36.0  g/dL   RDW 14.6 11.5 - 15.5 %   Platelets 711 (H) 150 - 400 K/uL   nRBC 0.0 0.0 - 0.2 %    Comment: Performed at [facility], [place] [address] [place], [place] [address]  Glucose, capillary     Status: Abnormal   Collection Time: 09/17/19  4:27 AM  Result Value Ref Range   Glucose-Capillary 107 (H) 70 - 99 mg/dL    Comment: Glucose reference range applies only to samples taken after fasting for at least 8 hours.   VAS [place] LOWER EXTREMITY VENOUS (DVT)  Result Date: 09/17/2019  Lower Venous DVTStudy Indications:  Leukocytosis.  Comparison Study: No prior study Performing Technologist: Maudry Mayhew MHA, RDMS, RVT, RDCS  Examination Guidelines: A complete evaluation includes B-mode imaging, spectral Doppler, color Doppler, and power Doppler as needed of all accessible portions of each vessel. Bilateral testing is considered an integral part of a complete examination. Limited examinations for reoccurring indications may be performed as noted. The reflux portion of the exam is performed with the patient in reverse Trendelenburg.  +---------+---------------+---------+-----------+----------+--------------+ RIGHT    CompressibilityPhasicitySpontaneityPropertiesThrombus Aging +---------+---------------+---------+-----------+----------+--------------+ CFV      Full           Yes      Yes                                 +---------+---------------+---------+-----------+----------+--------------+ SFJ      Full                                                        +---------+---------------+---------+-----------+----------+--------------+ FV Prox  Full                                                        +---------+---------------+---------+-----------+----------+--------------+ FV Mid   Full                                                        +---------+---------------+---------+-----------+----------+--------------+ FV DistalFull                                                        +---------+---------------+---------+-----------+----------+--------------+ PFV      Full                                                        +---------+---------------+---------+-----------+----------+--------------+ POP      Full           Yes      Yes                                 +---------+---------------+---------+-----------+----------+--------------+  PTV      Full                                                         +---------+---------------+---------+-----------+----------+--------------+ PERO     Full                                                        +---------+---------------+---------+-----------+----------+--------------+   +---------+---------------+---------+-----------+----------+--------------+ LEFT     CompressibilityPhasicitySpontaneityPropertiesThrombus Aging +---------+---------------+---------+-----------+----------+--------------+ CFV      Full           Yes      Yes                                 +---------+---------------+---------+-----------+----------+--------------+ SFJ      Full                                                        +---------+---------------+---------+-----------+----------+--------------+ FV Prox  Full                                                        +---------+---------------+---------+-----------+----------+--------------+ FV Mid   Full                                                        +---------+---------------+---------+-----------+----------+--------------+ FV DistalFull                                                        +---------+---------------+---------+-----------+----------+--------------+ PFV      Full                                                        +---------+---------------+---------+-----------+----------+--------------+ POP      Full           Yes      Yes                                 +---------+---------------+---------+-----------+----------+--------------+ PTV      Full                                                        +---------+---------------+---------+-----------+----------+--------------+  PERO     Full                                                        +---------+---------------+---------+-----------+----------+--------------+     Summary: RIGHT: - There is no evidence of deep vein thrombosis in the lower extremity.  - No cystic structure found in  the popliteal fossa.  LEFT: - There is no evidence of deep vein thrombosis in the lower extremity.  - No cystic structure found in the popliteal fossa.  *See table(s) above for measurements and observations. Electronically signed by Harold Barban MD on 09/17/2019 at 6:33:31 AM.    Final    VAS [place] UPPER EXTREMITY VENOUS DUPLEX  Result Date: 09/17/2019 UPPER VENOUS STUDY  Indications: leukocytosis Limitations: Cervical collar. Comparison Study: No prior study Performing Technologist: Maudry Mayhew MHA, RDMS, RVT, RDCS  Examination Guidelines: A complete evaluation includes B-mode imaging, spectral Doppler, color Doppler, and power Doppler as needed of all accessible portions of each vessel. Bilateral testing is considered an integral part of a complete examination. Limited examinations for reoccurring indications may be performed as noted.  Right Findings: +----------+------------+---------+-----------+----------+--------------+ RIGHT     CompressiblePhasicitySpontaneousProperties   Summary     +----------+------------+---------+-----------+----------+--------------+ IJV                                                 Not visualized +----------+------------+---------+-----------+----------+--------------+ Subclavian    Full       Yes       Yes                             +----------+------------+---------+-----------+----------+--------------+ Axillary      Full       Yes       Yes                             +----------+------------+---------+-----------+----------+--------------+ Brachial      Full       Yes       Yes                             +----------+------------+---------+-----------+----------+--------------+ Radial        Full                                                 +----------+------------+---------+-----------+----------+--------------+ Ulnar         Full                                                  +----------+------------+---------+-----------+----------+--------------+ Cephalic      Full                                                 +----------+------------+---------+-----------+----------+--------------+  Basilic       Full                                                 +----------+------------+---------+-----------+----------+--------------+  Left Findings: +----------+------------+---------+-----------+--------------+-----------------+ LEFT      CompressiblePhasicitySpontaneous  Properties       Summary      +----------+------------+---------+-----------+--------------+-----------------+ IJV                                                      Not visualized   +----------+------------+---------+-----------+--------------+-----------------+ Subclavian    Full       Yes       Yes                                    +----------+------------+---------+-----------+--------------+-----------------+ Axillary      Full       Yes       Yes                                    +----------+------------+---------+-----------+--------------+-----------------+ Brachial      Full       Yes       Yes                                    +----------+------------+---------+-----------+--------------+-----------------+ Radial        Full                                                        +----------+------------+---------+-----------+--------------+-----------------+ Ulnar         Full                                                        +----------+------------+---------+-----------+--------------+-----------------+ Cephalic      None                            spongy    Age Indeterminate                                           w/compression                   +----------+------------+---------+-----------+--------------+-----------------+ Basilic                                                  Not visualized    +----------+------------+---------+-----------+--------------+-----------------+  Summary:  Right: No evidence of deep vein thrombosis involving the visualized veins of the upper extremity. No evidence of superficial vein thrombosis in the upper extremity.  Left: No evidence of deep vein thrombosis involving the visualized veins of the upper extremity. 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  *See table(s) above for measurements and observations.  Diagnosing physician: Harold Barban MD Electronically signed by Harold Barban MD on 09/17/2019 at 6:33:37 AM.    Final        Medical Problem List and Plan: 1.  Decreased functional mobility with altered mental status secondary to traumatic bilateral SDH/SAH.  Status post right frontotemporoparietal craniectomy evacuation of acute subdural hematoma and insertion of right frontal ventriculostomy insertion of craniotomy flap into the abdominal subcutaneous tissue 09/05/2019.  Helmet when out of bed.  -patient may not yet shower  -ELOS/Goals: 2-3 weeks, supervision goals 2.  Antithrombotics: -DVT/anticoagulation: Lovenox 09/09/2019.  Venous Doppler studies upper lower extremities negative except superficial cephalic vein thrombosis  -antiplatelet therapy: N/A 3. Pain Management: Lidoderm patch, Robaxin 500 mg every 8 hours and oxycodone as needed 4. Mood: Provide emotional support  -antipsychotic agents: N/A  -check sleep wake chart 5. Neuropsych: This patient is not capable of making decisions on his own behalf. 6. Skin/Wound Care: Routine skin checks.   -local care to abdomen and scalp 7. Fluids/Electrolytes/Nutrition: Routine in and outs with follow-up chemistries 8.  L1/2 right transverse process fractures.  Conservative care 9.  Urinary retention.  Continue Urecholine.  Check PVR.  Recent urinalysis negative 10.  Dysphagia.  Dysphagia #3 thin liquids.  Follow-up speech therapy        Cathlyn Parsons, PA-C 09/18/2019

## 2019-09-19 LAB — CBC
HCT: 39.4 % (ref 39.0–52.0)
Hemoglobin: 12.9 g/dL — ABNORMAL LOW (ref 13.0–17.0)
MCH: 31 pg (ref 26.0–34.0)
MCHC: 32.7 g/dL (ref 30.0–36.0)
MCV: 94.7 fL (ref 80.0–100.0)
Platelets: 758 10*3/uL — ABNORMAL HIGH (ref 150–400)
RBC: 4.16 MIL/uL — ABNORMAL LOW (ref 4.22–5.81)
RDW: 14.6 % (ref 11.5–15.5)
WBC: 14.3 10*3/uL — ABNORMAL HIGH (ref 4.0–10.5)
nRBC: 0 % (ref 0.0–0.2)

## 2019-09-19 MED ORDER — ORAL CARE MOUTH RINSE
15.0000 mL | Freq: Two times a day (BID) | OROMUCOSAL | Status: DC
  Administered 2019-09-19 – 2019-09-20 (×2): 15 mL via OROMUCOSAL

## 2019-09-19 NOTE — Progress Notes (Signed)
Subjective: The patient is alert and pleasant.  His nephew is at the bedside.  Objective: Vital signs in last 24 hours: Temp:  [98.2 F (36.8 C)-99.1 F (37.3 C)] 98.3 F (36.8 C) (07/14 0816) Pulse Rate:  [67-90] 67 (07/14 0816) Resp:  [16-18] 18 (07/14 0816) BP: (108-124)/(70-89) 121/78 (07/14 0816) SpO2:  [97 %-100 %] 100 % (07/14 0816) Estimated body mass index is 21.64 kg/m as calculated from the following:   Height as of this encounter: 5\' 10"  (1.778 m).   Weight as of this encounter: 68.4 kg.   Intake/Output from previous day: 07/13 0701 - 07/14 0700 In: -  Out: 150 [Urine:150] Intake/Output this shift: No intake/output data recorded.  Physical exam the patient is without change from yesterday.  Lab Results: Recent Labs    09/17/19 0412 09/19/19 0331  WBC 15.2* 14.3*  HGB 12.2* 12.9*  HCT 37.1* 39.4  PLT 711* 758*   BMET Recent Labs    09/17/19 0412  NA 136  K 4.2  CL 99  CO2 26  GLUCOSE 113*  BUN 16  CREATININE 0.57*  CALCIUM 9.6    Studies/Results: No results found.  Assessment/Plan: Postop day #14: The patient is doing well.  He is ready for rehab.  We will plan to replace his craniectomy flap when his scalp begins to sink.  I spoke with his nephew.  LOS: 14 days     Ophelia Charter 09/19/2019, 9:10 AM

## 2019-09-19 NOTE — Progress Notes (Signed)
SLP Cancellation Note  Patient Details Name: Miley Lindon MRN: [id number] DOB: 06/08/1990   Cancelled treatment:       Reason Eval/Treat Not Completed: Patient's level of consciousness. Sleeping very deeply, Has been tolerating diet. Will f/u.    Travus Oren, Katherene Ponto 09/19/2019, 2:51 PM

## 2019-09-19 NOTE — Progress Notes (Signed)
Inpatient Rehabilitation Admissions Coordinator  I continue to work with family friend, Anderson Malta about possible workers Designer, industrial/product. The insurance company agent is working through issues .  Danne Baxter, RN, MSN Rehab Admissions Coordinator [phone] 09/19/2019 12:29 PM

## 2019-09-19 NOTE — Progress Notes (Signed)
14 Days Post-Op  Subjective: CC: Interpreter used. Patient is A&O x 4. Follows commands x 4 extremities. No pain. C-Collar in place.  Objective: Vital signs in last 24 hours: Temp:  [98.2 F (36.8 C)-99.1 F (37.3 C)] 98.2 F (36.8 C) (07/14 0337) Pulse Rate:  [74-90] 75 (07/14 0337) Resp:  [16-18] 16 (07/14 0337) BP: (108-124)/(70-89) 108/70 (07/14 0337) SpO2:  [97 %-100 %] 100 % (07/14 0337) Last BM Date: 09/16/19  Intake/Output from previous day: 07/13 0701 - 07/14 0700 In: -  Out: 150 [Urine:150] Intake/Output this shift: No intake/output data recorded.  PE: Gen: Alert, NAD HEENT:Pupils reactive to light. Scalp incision cdi with staples intact and no erythema or drainage.  Neck: C-Collar in place Card:RRR, 2+ DP pulses Pulm: CTAB, no W/R/R, rate and effort normal on room air Abd: Soft, NT/ND, +BS, incision cdi with steri strips in place and no erythema or drainage Ext: no BUE/BLE edema, calves soft and nontender Neuro: Alert, oriented to self, location ([place], hospital), year, and situation. Follows commands with BUE/BLE Skin: warm and dry  Lab Results:  Recent Labs    09/17/19 0412 09/19/19 0331  WBC 15.2* 14.3*  HGB 12.2* 12.9*  HCT 37.1* 39.4  PLT 711* 758*   BMET Recent Labs    09/17/19 0412  NA 136  K 4.2  CL 99  CO2 26  GLUCOSE 113*  BUN 16  CREATININE 0.57*  CALCIUM 9.6   PT/INR No results for input(s): LABPROT, INR in the last 72 hours. CMP     Component Value Date/Time   NA 136 09/17/2019 0412   K 4.2 09/17/2019 0412   CL 99 09/17/2019 0412   CO2 26 09/17/2019 0412   GLUCOSE 113 (H) 09/17/2019 0412   BUN 16 09/17/2019 0412   CREATININE 0.57 (L) 09/17/2019 0412   CALCIUM 9.6 09/17/2019 0412   PROT 5.5 (L) 09/06/2019 0500   ALBUMIN 3.1 (L) 09/06/2019 0500   AST 25 09/06/2019 0500   ALT 16 09/06/2019 0500   ALKPHOS 40 09/06/2019 0500   BILITOT 2.2 (H) 09/06/2019 0500   GFRNONAA >60 09/17/2019 0412   GFRAA >60  09/17/2019 0412   Lipase  No results found for: LIPASE     Studies/Results: No results found.  Anti-infectives: Anti-infectives (From admission, onward)   Start     Dose/Rate Route Frequency Ordered Stop   09/05/19 1250  bacitracin 50,000 Units in sodium chloride 0.9 % 500 mL irrigation  Status:  Discontinued          As needed 09/05/19 1250 09/05/19 1414       Assessment/Plan Fall B SDH R>L with midline shift and uncal herniation, L temporal contusion and SAH-s/pdecompressive R craniectomy and placement L EVD by Dr. Arnoldo Morale 6/30. Bone flap in abdominal wall,will need to have his craniotomy flap replaced when his craniotomy flap becomes a bit sunken per NSGY notes.Abdominal staples out, scalp staples remain.Plan per notes is removal some time this week. Helmet when OOB.  Occipital and clival midline fxs extending into R sphenoid with hemosinus and pneumocephalus- maintain c-collar VDRF- extubated 7/5and tolerating well L1/2 R TP fxs- pain control, lidocaine patch Leukocytosis -afebrile. T max 99.1. CXR with atelectasis, otherwise neg. U/a and Ucx neg. BUE/BLE shows L cephalic vein superficial vein thrombosis and neg for DVT. Surgical incisions cdi. No indication for abx, continue to monitor. WBC downtrending at 14.2 Urinary retention - foley out 7/4, on urecholine FEN - D3 diet, continue salt tabs VTE - SCDs,LMWH,  duplex negative except superficial cephalic v thromb ID- afebrile, monitor leukocytosis, improving Plan -SDU, therapies,medically stable for discharge to CIR when bed available. Patients brother states that after CIR patient will have family (him and uncle) that plan to divide shifts to be with him 24/7.    LOS: 14 days    Jillyn Ledger , [facility] Surgery 09/19/2019, 7:48 AM Please see Amion for pager number during day hours 7:00am-4:30pm

## 2019-09-19 NOTE — Progress Notes (Signed)
Physical Therapy Treatment Patient Details Name: Jason Skinner MRN: [id number] DOB: 1990/07/02 Today's Date: 09/19/2019    History of Present Illness 29yo male who was brought into [facility] earlier today as a level 2 trauma after falling about 20 feet from a ladder on construction site. Pt is found to have B SDH R>L with midline shift and uncal herniation, L temporal contusion and SAH, Occipital and clivial midline fxs extending into R sphenoid with hemosinus and pneumocephalus, respiratory failure, atelectasis, L1-2 TP fxs. Pt was intubated on 6/30. Pt underwent R frontotemporoparietal craniectomy for evacuation of acute subdural hematoma; insertion of right frontal ventriculostomy; insertion of craniotomy flap into the abdominal subcutaneous tissue. Pt extubated on 7/5.    PT Comments    Patient progressing reports improved symptoms of dizziness wearing eye patch, but with increased HA today limiting tolerance to upright.  Encouraged OOB to chair when headache improved to about 5/10.  Also educated on wearing eye patch alternately on R vs. L eye to improve strength and focus of R eye as well.  Patient remains appropriate for CIR level rehab.  PT to continue to follow.    Follow Up Recommendations  CIR     Equipment Recommendations  Rolling walker with 5" wheels;3in1 (PT)    Recommendations for Other Services       Precautions / Restrictions Precautions Precautions: Fall Precaution Comments: bone flap in abdomen, helmet when OOB Required Braces or Orthoses: Cervical Brace;Other Brace Cervical Brace: Hard collar;At all times Other Brace: helmet for OOB     Mobility  Bed Mobility Overal bed mobility: Needs Assistance Bed Mobility: Rolling;Sidelying to Sit Rolling: Min guard Sidelying to sit: Min assist       General bed mobility comments: rolling to L with cues for technique  Transfers Overall transfer level: Needs assistance Equipment used: Rolling walker (2 wheeled) Transfers:  Sit to/from Stand Sit to Stand: Min assist         General transfer comment: assist for balance, cues for visual fixation to aide with dizziness  Ambulation/Gait Ambulation/Gait assistance: Mod assist Gait Distance (Feet): 100 Feet Assistive device: Rolling walker (2 wheeled) Gait Pattern/deviations: Step-through pattern;Decreased stride length;Drifts right/left     General Gait Details: assist for posture, walker management and cues for focusing on visual target for managing dizziness, wearing helmet, cervical collar and eye patch on R eye   Stairs             Wheelchair Mobility    Modified Rankin (Stroke Patients Only)       Balance Overall balance assessment: Needs assistance   Sitting balance-Leahy Scale: Good Sitting balance - Comments: donned socks sitting EOB with min A   Standing balance support: Bilateral upper extremity supported Standing balance-Leahy Scale: Poor Standing balance comment: UE support, tends to lean to R                            Cognition Arousal/Alertness: Awake/alert Behavior During Therapy: Flat affect Overall Cognitive Status: Impaired/Different from baseline Area of Impairment: Attention;Memory;Following commands;Safety/judgement;Problem solving;Rancho level               Rancho Levels of Cognitive Functioning Ranch[place] [employer] Scales of Cognitive Functioning: Confused/appropriate     Memory: Decreased short-term memory Following Commands: Follows one step commands with increased time;Follows multi-step commands with increased time;Follows one step commands consistently Safety/Judgement: Decreased awareness of safety;Decreased awareness of deficits Awareness: Intellectual Problem Solving: Slow processing;Difficulty sequencing;Requires verbal cues  Exercises Other Exercises Other Exercises: seated scapular retraction x 10 with 5 sec hold, sit to stand x 5 with cues for forward gaze on red light  switch for habituation/dizziness    General Comments General comments (skin integrity, edema, etc.): Graciella present for interpreting.  Educated on eye patch wearing during waking hours and to rotate to L eye to strengthen R eye as well, brother in room and wife on face time with two daughters      Pertinent Vitals/Pain Pain Assessment: 0-10 Pain Score: 8  Pain Location: head Pain Descriptors / Indicators: Headache Pain Intervention(s): Monitored during session;Limited activity within patient's tolerance;Repositioned    Home Living                      Prior Function            PT Goals (current goals can now be found in the care plan section) Progress towards PT goals: Progressing toward goals    Frequency    Min 5X/week      PT Plan Current plan remains appropriate    Co-evaluation              AM-PAC PT "6 Clicks" Mobility   Outcome Measure  Help needed turning from your back to your side while in a flat bed without using bedrails?: A Little Help needed moving from lying on your back to sitting on the side of a flat bed without using bedrails?: A Little Help needed moving to and from a bed to a chair (including a wheelchair)?: A Little Help needed standing up from a chair using your arms (e.g., wheelchair or bedside chair)?: A Little Help needed to walk in hospital room?: A Lot Help needed climbing 3-5 steps with a railing? : Total 6 Click Score: 15    End of Session Equipment Utilized During Treatment: Gait belt;Other (comment);Cervical collar (helmet, eye patch) Activity Tolerance: Patient limited by pain Patient left: in bed;with call bell/phone within reach;with family/visitor present   PT Visit Diagnosis: Other abnormalities of gait and mobility (R26.89);Ataxic gait (R26.0);Other symptoms and signs involving the nervous system (R29.898)     Time: 3013-1438 PT Time Calculation (min) (ACUTE ONLY): 23 min  Charges:  $Gait Training: 8-22  mins $Therapeutic Activity: 8-22 mins                     Magda Kiel, PT Acute Rehabilitation Services Pager:[phone] Office:[phone] 09/19/2019    Reginia Naas 09/19/2019, 5:47 PM

## 2019-09-20 ENCOUNTER — Inpatient Hospital Stay (HOSPITAL_COMMUNITY)
Admission: RE | Admit: 2019-09-20 | Discharge: 2019-10-05 | DRG: 945 | Disposition: A | Discharge status: Home / Self Care | Payer: Self-pay | Source: Intra-hospital | Attending: Physical Medicine & Rehabilitation | Admitting: Physical Medicine & Rehabilitation

## 2019-09-20 ENCOUNTER — Encounter (HOSPITAL_COMMUNITY): Payer: Self-pay | Admitting: Physical Medicine & Rehabilitation

## 2019-09-20 ENCOUNTER — Other Ambulatory Visit: Payer: Self-pay

## 2019-09-20 DIAGNOSIS — G8918 Other acute postprocedural pain: Secondary | ICD-10-CM

## 2019-09-20 DIAGNOSIS — Z87820 Personal history of traumatic brain injury: Secondary | ICD-10-CM

## 2019-09-20 DIAGNOSIS — R339 Retention of urine, unspecified: Secondary | ICD-10-CM

## 2019-09-20 DIAGNOSIS — S065X0A Traumatic subdural hemorrhage without loss of consciousness, initial encounter: Secondary | ICD-10-CM

## 2019-09-20 DIAGNOSIS — R32 Unspecified urinary incontinence: Secondary | ICD-10-CM

## 2019-09-20 DIAGNOSIS — E871 Hypo-osmolality and hyponatremia: Secondary | ICD-10-CM | POA: Diagnosis present

## 2019-09-20 DIAGNOSIS — S065X9D Traumatic subdural hemorrhage with loss of consciousness of unspecified duration, subsequent encounter: Principal | ICD-10-CM

## 2019-09-20 DIAGNOSIS — R1312 Dysphagia, oropharyngeal phase: Secondary | ICD-10-CM

## 2019-09-20 DIAGNOSIS — S32029D Unspecified fracture of second lumbar vertebra, subsequent encounter for fracture with routine healing: Secondary | ICD-10-CM

## 2019-09-20 DIAGNOSIS — I82619 Acute embolism and thrombosis of superficial veins of unspecified upper extremity: Secondary | ICD-10-CM

## 2019-09-20 DIAGNOSIS — D75839 Thrombocytosis, unspecified: Secondary | ICD-10-CM

## 2019-09-20 DIAGNOSIS — S065X3S Traumatic subdural hemorrhage with loss of consciousness of 1 hour to 5 hours 59 minutes, sequela: Secondary | ICD-10-CM

## 2019-09-20 DIAGNOSIS — S0219XD Other fracture of base of skull, subsequent encounter for fracture with routine healing: Secondary | ICD-10-CM

## 2019-09-20 DIAGNOSIS — D473 Essential (hemorrhagic) thrombocythemia: Secondary | ICD-10-CM | POA: Diagnosis present

## 2019-09-20 DIAGNOSIS — I82819 Embolism and thrombosis of superficial veins of unspecified lower extremities: Secondary | ICD-10-CM | POA: Diagnosis not present

## 2019-09-20 DIAGNOSIS — W11XXXD Fall on and from ladder, subsequent encounter: Secondary | ICD-10-CM | POA: Diagnosis present

## 2019-09-20 DIAGNOSIS — D62 Acute posthemorrhagic anemia: Secondary | ICD-10-CM

## 2019-09-20 DIAGNOSIS — R1310 Dysphagia, unspecified: Secondary | ICD-10-CM

## 2019-09-20 DIAGNOSIS — S065XAA Traumatic subdural hemorrhage with loss of consciousness status unknown, initial encounter: Secondary | ICD-10-CM | POA: Diagnosis present

## 2019-09-20 DIAGNOSIS — G441 Vascular headache, not elsewhere classified: Secondary | ICD-10-CM

## 2019-09-20 LAB — CREATININE, SERUM
Creatinine, Ser: 0.65 mg/dL (ref 0.61–1.24)
GFR calc Af Amer: >60 mL/min (ref >60)
GFR calc non Af Amer: >60 mL/min (ref >60)

## 2019-09-20 MED ORDER — LIDOCAINE 5 % EX PTCH
1.0000 | MEDICATED_PATCH | CUTANEOUS | Status: DC
  Administered 2019-09-21 – 2019-10-04 (×14): 1 via TRANSDERMAL
  Filled 2019-09-20 (×13): qty 1

## 2019-09-20 MED ORDER — ENOXAPARIN SODIUM 30 MG/0.3ML ~~LOC~~ SOLN
30.0000 mg | Freq: Two times a day (BID) | SUBCUTANEOUS | Status: DC
  Administered 2019-09-20 – 2019-10-04 (×29): 30 mg via SUBCUTANEOUS
  Filled 2019-09-20 (×29): qty 0.3

## 2019-09-20 MED ORDER — ACETAMINOPHEN 325 MG PO TABS
325.0000 mg | ORAL_TABLET | ORAL | Status: DC | PRN
  Administered 2019-09-21: 650 mg via ORAL
  Administered 2019-09-22: 325 mg via ORAL
  Administered 2019-09-22 – 2019-09-28 (×10): 650 mg via ORAL
  Filled 2019-09-20: qty 2
  Filled 2019-09-20: qty 1
  Filled 2019-09-20 (×12): qty 2

## 2019-09-20 MED ORDER — BETHANECHOL CHLORIDE 25 MG PO TABS
25.0000 mg | ORAL_TABLET | Freq: Three times a day (TID) | ORAL | Status: DC
  Administered 2019-09-20 – 2019-09-24 (×12): 25 mg via ORAL
  Filled 2019-09-20 (×12): qty 1

## 2019-09-20 MED ORDER — ENSURE ENLIVE PO LIQD
237.0000 mL | Freq: Three times a day (TID) | ORAL | Status: DC
  Administered 2019-09-20 – 2019-10-04 (×36): 237 mL via ORAL

## 2019-09-20 MED ORDER — METHOCARBAMOL 500 MG PO TABS
500.0000 mg | ORAL_TABLET | Freq: Three times a day (TID) | ORAL | Status: DC
  Administered 2019-09-20 – 2019-10-02 (×37): 500 mg via ORAL
  Filled 2019-09-20 (×37): qty 1

## 2019-09-20 MED ORDER — ENOXAPARIN SODIUM 30 MG/0.3ML ~~LOC~~ SOLN: 30.0000 mg | Freq: Two times a day (BID) | SUBCUTANEOUS | Status: DC

## 2019-09-20 MED ORDER — ONDANSETRON HCL 4 MG/2ML IJ SOLN: 4.0000 mg | Freq: Four times a day (QID) | INTRAMUSCULAR | Status: DC | PRN

## 2019-09-20 MED ORDER — OXYCODONE HCL 5 MG/5ML PO SOLN
5.0000 mg | ORAL | Status: DC | PRN
  Administered 2019-09-21 (×3): 5 mg via ORAL
  Filled 2019-09-20 (×3): qty 5

## 2019-09-20 MED ORDER — SORBITOL 70 % SOLN
30.0000 mL | Freq: Every day | Status: DC | PRN
  Administered 2019-09-20 – 2019-10-04 (×4): 30 mL via ORAL
  Filled 2019-09-20 (×3): qty 30

## 2019-09-20 MED ORDER — POLYETHYLENE GLYCOL 3350 17 G PO PACK
17.0000 g | PACK | Freq: Every day | ORAL | Status: DC
  Administered 2019-09-21 – 2019-10-05 (×15): 17 g via ORAL
  Filled 2019-09-20 (×15): qty 1

## 2019-09-20 MED ORDER — BETHANECHOL CHLORIDE 25 MG PO TABS: 25.0000 mg | ORAL_TABLET | Freq: Three times a day (TID) | ORAL | Status: DC

## 2019-09-20 MED ORDER — ONDANSETRON 4 MG PO TBDP: 4.0000 mg | ORAL_TABLET | Freq: Four times a day (QID) | ORAL | Status: DC | PRN

## 2019-09-20 NOTE — Progress Notes (Signed)
Inpatient Rehabilitation Medication Review by a Pharmacist  A complete drug regimen review was completed for this patient to identify any potential clinically significant medication issues.  Clinically significant medication issues were identified: Yes.   Type of Medication Issue Identified Description of Issue Urgent (address now) Non-Urgent (address on AM team rounds) Plan Plan Accepted by Provider? (Yes / No / Pending AM Rounds)  Drug Interaction(s) (clinically significant)       Duplicate Therapy       Allergy       No Medication Administration End Date       Incorrect Dose       Additional Drug Therapy Needed       Other  Methocarbamol changed to oral instead of per tube.  Completed per scope of practice.      Time spent performing this drug regimen review (minutes):  15 min.   [name] 09/20/2019 5:54 PM

## 2019-09-20 NOTE — H&P (Signed)
Physical Medicine and Rehabilitation Admission H&P       HPI: Jason Skinner is a 29 year old right-handed limited English speaking male on no prescription medications.  Per report lives with friends.  He does not drive.  Mobile home with 4 steps to entry.  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  He has been in the [place] for approximately 3 years and has family including a spouse in [place].  He does have a brother and uncle in the local area.  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  Noted positive loss of consciousness.  Cranial CT scan showed right more than left cerebral convexity subdural hematoma with leftward midline shift of 2 cm and a right uncal herniation.  Left temporal pole contusion with overlying subarachnoid hemorrhage.  Occipital and clival midline fractures extending into the right sphenoid sinus with pneumocephalus.  CT of the chest abdomen pelvis showed an L1-L2 right transverse process fracture as well as incidental findings of a 2 cm nodular projection from the right thyroid lobe.  Admission chemistries potassium 3.1 alcohol negative lactic acid 3.1, SARS coronavirus negative.  Patient underwent right frontal temporal parietal craniectomy for evacuation of acute subdural hematoma and insertion of right frontal ventriculostomy insertion of craniotomy flap into the abdominal subcutaneous tissue 09/05/2019 per Dr. Arnoldo Morale.  Latest follow-up cranial CT scan showed decreased mass-effect of 5 mm leftward midline shift as previously 20 mm.  Conservative care of L1/2 right transverse process fractures.  Patient remained intubated through 09/10/2019.  Bilateral lower extremity Dopplers negative for DVT and upper extremities negative for DVT there was an anterior determinant superficial vein thrombosis involving the left cephalic vein.  He was cleared to begin Lovenox for DVT prophylaxis 09/09/2019.  His  diet has been advanced to mechanical soft thin liquids.  Bouts of urinary retention placed on Urecholine.  Mild leukocytosis 14,300and monitored.  Mild hyponatremia 131 improved to 136 sodium chloride tablets discontinued.  Therapy evaluations completed patient is wearing a helmet when out of bed .  Patient was admitted for a comprehensive rehab program.   Review of Systems  Unable to perform ROS: Language    No past medical history on file.   The histories are not reviewed yet. Please review them in the "History" navigator section and refresh this [place]. No family history on file. Social History:  has no history on file for tobacco use, alcohol use, and drug use. Allergies: No Known Allergies Medications Prior to Admission  Medication Sig Dispense Refill  . acetaminophen (TYLENOL) 500 MG tablet Take 500 mg by mouth every 6 (six) hours as needed for mild pain.          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 Brother is here.   Lives With: Friend(s)   Functional History:  Prior Function Level of Independence: Independent Comments: wife and 2 daughters in [place]   Functional Status:  Mobility: Bed Mobility Overal bed mobility: Needs Assistance Bed Mobility: Rolling, Sidelying to  Sit Rolling: Min guard Sidelying to sit: Mod assist Supine to sit: Mod assist Sit to supine: Mod assist, +2 for physical assistance General bed mobility comments: pt OOB in recliner Transfers Overall transfer level: Needs assistance Equipment used: 2 person hand held assist Transfers: Sit to/from Stand Sit to Stand: Min assist, +2 physical assistance Stand pivot transfers: Mod assist General transfer comment: MIN A +2 to power into standing, with periods of MOD A for balance during ambulation. Ambulation/Gait Ambulation/Gait assistance: Max assist, +2 physical assistance, +2 safety/equipment Gait Distance (Feet): 12 Feet (& 5') Assistive device: 2 person hand held assist Gait Pattern/deviations: Step-to pattern, Decreased stride length, Drifts right/left, Staggering left General Gait Details: L lateral lean and assist for balance, some dizziness throughout, seemed to worsen as session went on Gait velocity: reduced Gait velocity interpretation: <1.8 ft/sec, indicate of risk for recurrent falls   ADL: ADL Overall ADL's : Needs assistance/impaired Eating/Feeding: Moderate assistance, Sitting Eating/Feeding Details (indicate cue type and reason): pt undershooting when self feeding. pt using L hand instead of right initially.  Grooming: Wash/dry face, Standing, Supervision/safety, Set up Grooming Details (indicate cue type and reason): tactile cues needed to initiate task from standing at sink. pt with improved ideational apraxia. pt reports dizziness upon standing at sink needing to sit on BSC Upper Body Bathing: Maximal assistance Lower Body Bathing: Total assistance Upper Body Dressing : Maximal assistance Lower Body Dressing: Total assistance Toilet Transfer: Minimal assistance, +2 for safety/equipment, +2 for physical assistance, Ambulation Toilet Transfer Details (indicate cue type and reason): simulated via functional mobility, mobility limited d/t reports of  dizziness Toileting- Clothing Manipulation and Hygiene: Maximal assistance, Sit to/from stand Toileting - Clothing Manipulation Details (indicate cue type and reason): Pt incontinent of urine.  He was able to assist with peri care, but demonstrates decreased thoroughness  Functional mobility during ADLs: Minimal assistance, Moderate assistance, +2 for physical assistance, +2 for safety/equipment General ADL Comments: noted improvement with ideational apraxia able to wash face at sink, limited by dizziness this session. pt gesturing and talking more   Cognition: Cognition Overall Cognitive Status: Impaired/Different from baseline Arousal/Alertness: Awake/alert Orientation Level: Oriented X4 Attention: Focused, Sustained Focused Attention: Appears intact Sustained Attention: Appears intact Memory: Impaired Memory Impairment: Storage deficit, Decreased short term memory, Decreased recall of new information Decreased Short Term Memory: Verbal basic Awareness: Impaired Awareness Impairment: Intellectual impairment, Emergent impairment Problem Solving: Impaired Problem Solving Impairment: Verbal basic Executive Function: Reasoning, Initiating, Self Monitoring Reasoning: Impaired Reasoning Impairment: Verbal basic Initiating: Impaired Initiating Impairment: Verbal basic, Functional basic Self Monitoring: Impaired Self Monitoring Impairment: Verbal basic, Functional basic [employer] Scales of Cognitive Functioning: Confused/appropriate Cognition Arousal/Alertness: Awake/alert Behavior During Therapy: Flat affect Overall Cognitive Status: Impaired/Different from baseline Area of Impairment: Attention, Following commands, Safety/judgement, Awareness, Problem solving Orientation Level: Disoriented to, Time, Place, Situation Current Attention Level: Sustained Memory: Decreased short-term memory Following Commands: Follows one step commands with increased time, Follows multi-step  commands with increased time Safety/Judgement: Decreased awareness of safety, Decreased awareness of deficits Awareness: Intellectual Problem Solving: Slow processing, Difficulty sequencing, Requires verbal cues General Comments: pt following commands with increased time. able to state location and reports "falling" pt able to write last name on mirror and able to spell out his first name.   Physical Exam: Blood pressure 115/76,  pulse 77, temperature 98.6 F (37 C), temperature source Axillary, resp. rate 16, height 5\' 10"  (1.778 m), weight 68.4 kg, SpO2 97 %. Physical Exam Constitutional:      General: He is not in acute distress. HENT:     Right Ear: External ear normal.     Left Ear: External ear normal.     Nose: Nose normal.     Mouth/Throat:     Pharynx: No oropharyngeal exudate or posterior oropharyngeal erythema.  Eyes:     Conjunctiva/sclera: Conjunctivae normal.  Neck:     Comments: [place] collar in place Cardiovascular:     Rate and Rhythm: Normal rate and regular rhythm.  Pulmonary:     Effort: Pulmonary effort is normal. No respiratory distress.     Breath sounds: No wheezing or rales.  Abdominal:     General: Bowel sounds are normal.     Tenderness: There is abdominal tenderness.  Musculoskeletal:        General: Tenderness present.  Skin:    Comments: Abdominal surgical site clean and dry staples have been removed.  Craniectomy site clean and dry with staples intact  Neurological:     Comments: Resting but arose easily with tactile stim. Oriented to person and hospital. Followed basic commands with verbal and tactile cues. Mild cn III weakness on right. Moves both arms and legs spontaneously. Did not consistently participate in MMT. Senses pain in all 4's. No resting tone.   Psychiatric:     Comments: flat        Lab Results Last 48 Hours  Results for orders placed or performed during the hospital encounter of 09/05/19 (from the past 48 hour(s))  Glucose,  capillary     Status: None    Collection Time: 09/16/19  7:58 AM  Result Value Ref Range    Glucose-Capillary 88 70 - 99 mg/dL      Comment: Glucose reference range applies only to samples taken after fasting for at least 8 hours.  Glucose, capillary     Status: Abnormal    Collection Time: 09/16/19  8:43 PM  Result Value Ref Range    Glucose-Capillary 109 (H) 70 - 99 mg/dL      Comment: Glucose reference range applies only to samples taken after fasting for at least 8 hours.  Basic metabolic panel     Status: Abnormal    Collection Time: 09/17/19  4:12 AM  Result Value Ref Range    Sodium 136 135 - 145 mmol/L    Potassium 4.2 3.5 - 5.1 mmol/L    Chloride 99 98 - 111 mmol/L    CO2 26 22 - 32 mmol/L    Glucose, Bld 113 (H) 70 - 99 mg/dL      Comment: Glucose reference range applies only to samples taken after fasting for at least 8 hours.    BUN 16 6 - 20 mg/dL    Creatinine, Ser 0.57 (L) 0.61 - 1.24 mg/dL    Calcium 9.6 8.9 - 10.3 mg/dL    GFR calc non Af Amer >60 >60 mL/min    GFR calc Af Amer >60 >60 mL/min    Anion gap 11 5 - 15      Comment: Performed at [facility] [address], [place], [place] [address]  CBC     Status: Abnormal    Collection Time: 09/17/19  4:12 AM  Result Value Ref Range    WBC 15.2 (H) 4.0 - 10.5 K/uL  RBC 3.95 (L) 4.22 - 5.81 MIL/uL    Hemoglobin 12.2 (L) 13.0 - 17.0 g/dL    HCT 37.1 (L) 39 - 52 %    MCV 93.9 80.0 - 100.0 fL    MCH 30.9 26.0 - 34.0 pg    MCHC 32.9 30.0 - 36.0 g/dL    RDW 14.6 11.5 - 15.5 %    Platelets 711 (H) 150 - 400 K/uL    nRBC 0.0 0.0 - 0.2 %      Comment: Performed at [facility] [address] [place], [place] [address]  Glucose, capillary     Status: Abnormal    Collection Time: 09/17/19  4:27 AM  Result Value Ref Range    Glucose-Capillary 107 (H) 70 - 99 mg/dL      Comment: Glucose reference range applies only to samples taken after fasting for at least 8 hours.       Imaging Results (Last 48  hours)  VAS [place] LOWER EXTREMITY VENOUS (DVT)   Result Date: 09/17/2019  Lower Venous DVTStudy Indications: Leukocytosis.  Comparison Study: No prior study Performing Technologist: Maudry Mayhew MHA, RDMS, RVT, RDCS  Examination Guidelines: A complete evaluation includes B-mode imaging, spectral Doppler, color Doppler, and power Doppler as needed of all accessible portions of each vessel. Bilateral testing is considered an integral part of a complete examination. Limited examinations for reoccurring indications may be performed as noted. The reflux portion of the exam is performed with the patient in reverse Trendelenburg.  +---------+---------------+---------+-----------+----------+--------------+ RIGHT    CompressibilityPhasicitySpontaneityPropertiesThrombus Aging +---------+---------------+---------+-----------+----------+--------------+ CFV      Full           Yes      Yes                                 +---------+---------------+---------+-----------+----------+--------------+ SFJ      Full                                                        +---------+---------------+---------+-----------+----------+--------------+ FV Prox  Full                                                        +---------+---------------+---------+-----------+----------+--------------+ FV Mid   Full                                                        +---------+---------------+---------+-----------+----------+--------------+ FV DistalFull                                                        +---------+---------------+---------+-----------+----------+--------------+ PFV      Full                                                        +---------+---------------+---------+-----------+----------+--------------+  POP      Full           Yes      Yes                                 +---------+---------------+---------+-----------+----------+--------------+ PTV      Full                                                         +---------+---------------+---------+-----------+----------+--------------+ PERO     Full                                                        +---------+---------------+---------+-----------+----------+--------------+   +---------+---------------+---------+-----------+----------+--------------+ LEFT     CompressibilityPhasicitySpontaneityPropertiesThrombus Aging +---------+---------------+---------+-----------+----------+--------------+ CFV      Full           Yes      Yes                                 +---------+---------------+---------+-----------+----------+--------------+ SFJ      Full                                                        +---------+---------------+---------+-----------+----------+--------------+ FV Prox  Full                                                        +---------+---------------+---------+-----------+----------+--------------+ FV Mid   Full                                                        +---------+---------------+---------+-----------+----------+--------------+ FV DistalFull                                                        +---------+---------------+---------+-----------+----------+--------------+ PFV      Full                                                        +---------+---------------+---------+-----------+----------+--------------+ POP      Full           Yes      Yes                                 +---------+---------------+---------+-----------+----------+--------------+  PTV      Full                                                        +---------+---------------+---------+-----------+----------+--------------+ PERO     Full                                                        +---------+---------------+---------+-----------+----------+--------------+     Summary: RIGHT: - There is no evidence of deep vein thrombosis  in the lower extremity.  - No cystic structure found in the popliteal fossa.  LEFT: - There is no evidence of deep vein thrombosis in the lower extremity.  - No cystic structure found in the popliteal fossa.  *See table(s) above for measurements and observations. Electronically signed by Harold Barban MD on 09/17/2019 at 6:33:31 AM.    Final     VAS [place] UPPER EXTREMITY VENOUS DUPLEX   Result Date: 09/17/2019 UPPER VENOUS STUDY  Indications: leukocytosis Limitations: Cervical collar. Comparison Study: No prior study Performing Technologist: Maudry Mayhew MHA, RDMS, RVT, RDCS  Examination Guidelines: A complete evaluation includes B-mode imaging, spectral Doppler, color Doppler, and power Doppler as needed of all accessible portions of each vessel. Bilateral testing is considered an integral part of a complete examination. Limited examinations for reoccurring indications may be performed as noted.  Right Findings: +----------+------------+---------+-----------+----------+--------------+ RIGHT     CompressiblePhasicitySpontaneousProperties   Summary     +----------+------------+---------+-----------+----------+--------------+ IJV                                                 Not visualized +----------+------------+---------+-----------+----------+--------------+ Subclavian    Full       Yes       Yes                             +----------+------------+---------+-----------+----------+--------------+ Axillary      Full       Yes       Yes                             +----------+------------+---------+-----------+----------+--------------+ Brachial      Full       Yes       Yes                             +----------+------------+---------+-----------+----------+--------------+ Radial        Full                                                 +----------+------------+---------+-----------+----------+--------------+ Ulnar         Full                                                  +----------+------------+---------+-----------+----------+--------------+  Cephalic      Full                                                 +----------+------------+---------+-----------+----------+--------------+ Basilic       Full                                                 +----------+------------+---------+-----------+----------+--------------+  Left Findings: +----------+------------+---------+-----------+--------------+-----------------+ LEFT      CompressiblePhasicitySpontaneous  Properties       Summary      +----------+------------+---------+-----------+--------------+-----------------+ IJV                                                      Not visualized   +----------+------------+---------+-----------+--------------+-----------------+ Subclavian    Full       Yes       Yes                                    +----------+------------+---------+-----------+--------------+-----------------+ Axillary      Full       Yes       Yes                                    +----------+------------+---------+-----------+--------------+-----------------+ Brachial      Full       Yes       Yes                                    +----------+------------+---------+-----------+--------------+-----------------+ Radial        Full                                                        +----------+------------+---------+-----------+--------------+-----------------+ Ulnar         Full                                                        +----------+------------+---------+-----------+--------------+-----------------+ Cephalic      None                            spongy    Age Indeterminate                                           w/compression                   +----------+------------+---------+-----------+--------------+-----------------+  Basilic                                                  Not visualized    +----------+------------+---------+-----------+--------------+-----------------+  Summary:  Right: No evidence of deep vein thrombosis involving the visualized veins of the upper extremity. No evidence of superficial vein thrombosis in the upper extremity.  Left: No evidence of deep vein thrombosis involving the visualized veins of the upper extremity. 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  *See table(s) above for measurements and observations.  Diagnosing physician: Harold Barban MD Electronically signed by Harold Barban MD on 09/17/2019 at 6:33:37 AM.    Final              Medical Problem List and Plan: 1.  Decreased functional mobility with altered mental status secondary to traumatic bilateral SDH/SAH.  Status post right frontotemporoparietal craniectomy evacuation of acute subdural hematoma and insertion of right frontal ventriculostomy insertion of craniotomy flap into the abdominal subcutaneous tissue 09/05/2019.  Helmet when out of bed.             -patient may not yet shower             -ELOS/Goals: 2-3 weeks, supervision goals 2.  Antithrombotics: -DVT/anticoagulation: Lovenox 09/09/2019.  Venous Doppler studies upper lower extremities negative except superficial cephalic vein thrombosis             -antiplatelet therapy: N/A 3. Pain Management: Lidoderm patch, Robaxin 500 mg every 8 hours and oxycodone as needed 4. Mood: Provide emotional support             -antipsychotic agents: N/A             -check sleep wake chart 5. Neuropsych: This patient is not capable of making decisions on his own behalf. 6. Skin/Wound Care: Routine skin checks.              -local care to abdomen and scalp 7. Fluids/Electrolytes/Nutrition: Routine in and outs with follow-up chemistries 8.  L1/2 right transverse process fractures.  Conservative care 9.  Urinary retention.  Continue Urecholine.  Check PVR.  Recent urinalysis negative 10.   Dysphagia.  Dysphagia #3 thin liquids.  Follow-up speech therapy           Cathlyn Parsons, PA-C 09/18/2019  I have personally performed a face to face diagnostic evaluation of this patient and formulated the key components of the plan.  Additionally, I have personally reviewed laboratory data, imaging studies, as well as relevant notes and concur with the physician assistant's documentation above.  The patient's status has not changed from the original H&P.  Any changes in documentation from the acute care chart have been noted above.  Meredith Staggers, MD, [facility]

## 2019-09-20 NOTE — Progress Notes (Signed)
[name], MD  Physician  Physical Medicine and Rehabilitation  Consult Note      Signed  Date of Service:  09/13/2019  5:42 AM      Related encounter: ED to Hosp-Admission (Discharged) from 09/05/2019 in [place] All Collapse All  Show:Clear all [x] Manual[x] Template[] Copied  Added by: [x] Angiulli, Lavon Paganini, PA-C[x] Raulkar, Clide Deutscher, MD  [] Hover for details          Physical Medicine and Rehabilitation Consult Reason for Consult: Altered mental status Referring Physician: Trauma     HPI: Jason Skinner is a 29 y.o. right-handed male on no prescription medications.  Per report patient lives with friends.  He does not drive.  Mobile home 4 steps to entry.  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  He has been in the [place] for approximately 3 years and has family in [place].  He does by report have a brother in the area.  Presented 09/05/2019 after a fall approximately 20 feet from a ladder while at a construction site.  Noted positive loss of consciousness.  Cranial CT scan showed right more than left cerebral convexity subdural hematoma with leftward midline shift of 2 cm and right uncal herniation.  Left temporal pole contusion with overlying subarachnoid hemorrhage.  Occipital and clival midline fractures extending into the right sphenoid sinus with pneumocephalus.  CT of the chest abdomen pelvis showed an L1 and L2 right transverse process fractures as well as incidental finding of a 2 cm nodular projection from the right thyroid lobe.  Admission chemistries with potassium 3.1, alcohol negative, lactic acid 3.1, SARS coronavirus negative.  Patient underwent right frontotemporoparietal craniectomy for evacuation of acute subdural hematoma and insertion of right frontal ventriculostomy insertion of craniotomy flap into the abdominal subcutaneous tissue 09/05/2019 per Dr. Arnoldo Morale.  Latest follow-up cranial CT scan  showed decreased mass-effect of 5 mm leftward midline shift as previously 20 mm.  Patient remained intubated until 09/10/2019.  Patient is currently n.p.o. with alternative means of nutritional support as patient did pull out his CORTRAK and awaiting plan to resume.  Maintained on Keppra for seizure prophylaxis.  Bouts of urinary retention maintained on Urecholine.  Patient was cleared to begin Lovenox for DVT prophylaxis 09/09/2019.  Therapy evaluations completed with recommendations of physical medicine rehab consult.     Review of Systems  Unable to perform ROS: Acuity of condition    PMH/PSH/FH: patient is unable to provide.  Social History:  has no history on file for tobacco use, alcohol use, and drug use. Allergies: No Known Allergies       Medications Prior to Admission  Medication Sig Dispense Refill  . acetaminophen (TYLENOL) 500 MG tablet Take 500 mg by mouth every 6 (six) hours as needed for mild pain.          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 He clean clothes at laundry mat, goes shopping to stores, to goes to [place], rides bike to store because he does not have a car, He has been in [place] 3 years and family in [place]. Brother is here.  Lives With: Friend(s)  Functional History: Prior Function Level of Independence: Independent Comments: wife and 2 daughters in [place] Functional Status:  Mobility: Bed Mobility Overal bed mobility: Needs Assistance Bed Mobility: Sit to Supine Sit to supine: Mod assist, +2 for physical assistance General bed mobility  comments: Pt requires tactile cues to roll toward R side and physical (A) to push up from bed surface. pt requires total (A) of therapist to help ensure back precautions and R skull flap protection Transfers Overall transfer level: Needs assistance Equipment used: 1 person hand held assist Transfers: Sit to/from Stand, Stand Pivot Transfers Sit to Stand: Mod assist Stand pivot transfers: Max assist General transfer comment: Pt with weakness noted with R LE. pt stepping with L LE toward chair. pt with therapist placing hand on chair to gain visual attention to help with initiation of transfer. pt with no control to sit in chair surface   ADL: ADL Overall ADL's : Needs assistance/impaired Eating/Feeding: Moderate assistance, Sitting Eating/Feeding Details (indicate cue type and reason): pt undershooting when self feeding. pt using L hand instead of right initially.  Grooming: Wash/dry face, Moderate assistance, Sitting Grooming Details (indicate cue type and reason): asked to wash face and reaching with L hand to wash face Upper Body Bathing: Maximal assistance Lower Body Bathing: Total assistance Upper Body Dressing : Maximal assistance Lower Body Dressing: Total assistance Toilet Transfer: Moderate assistance, Buyer, retail Details (indicate cue type and reason): simulated bed to chair  Toileting- Clothing Manipulation and Hygiene: Total assistance General ADL Comments: pt transfered from bed to chair this session. pt noted to have dysconjugate gaze   Cognition: Cognition Overall Cognitive Status: Impaired/Different from baseline Arousal/Alertness: Awake/alert Orientation Level: Oriented to person Attention: Focused, Sustained Focused Attention: Appears intact Sustained Attention: Appears intact Memory: Impaired Memory Impairment: Storage deficit, Decreased short term memory, Decreased recall of new information Decreased Short Term Memory: Verbal basic Awareness:  Impaired Awareness Impairment: Intellectual impairment, Emergent impairment Problem Solving: Impaired Problem Solving Impairment: Verbal basic Executive Function: Reasoning, Initiating, Self Monitoring Reasoning: Impaired Reasoning Impairment: Verbal basic Initiating: Impaired Initiating Impairment: Verbal basic, Functional basic Self Monitoring: Impaired Self Monitoring Impairment: Verbal basic, Functional basic [employer] Scales of Cognitive Functioning: Confused/inappropriate/non-agitated Cognition Arousal/Alertness: Awake/alert Behavior During Therapy: Flat affect Overall Cognitive Status: Impaired/Different from baseline Area of Impairment: Attention, Memory, Following commands, Safety/judgement, Awareness, Problem solving Current Attention Level: Focused Memory: Decreased recall of precautions, Decreased short-term memory Following Commands: Follows multi-step commands with increased time Safety/Judgement: Decreased awareness of safety, Decreased awareness of deficits Awareness: Intellectual Problem Solving: Slow processing, Difficulty sequencing General Comments: pt attempting to feed therapist when cued to self feed. pt attempting to touch SLP hair and touching her foot with his foot. Pt told this was not appropriate but continued even after being asked to stop. pt able to state wife and children names. pt recognized brother in room. Pt unaware of location as hospital even after being told will report "at a friends house". pt unable to retain location as hospital   Blood pressure 120/71, pulse 86, temperature 99.2 F (37.3 C), temperature source Axillary, resp. rate 17, height 5\' 10"  (1.778 m), weight 82.3 kg, SpO2 98 %. Physical Exam   General: Brother is at bedside. Patient is sitting up in chair.  HEENT: Helmet in place, right eye shut. Neck: Supple without JVD or lymphadenopathy Heart: Reg rate and rhythm. No murmurs rubs or gallops Chest: CTA bilaterally without  wheezes, rales, or rhonchi; no distress Abdomen: Soft,  non-tender, non-distended, bowel sounds positive. Extremities: No clubbing, cyanosis, or edema. Pulses are 2+ Skin: Clean and intact without signs of breakdown Neuro: Patient is lethargic but arousable.  NAD.  Primarily speaks Spanish.  Unable to verbally respond at this time but brother at bedside said he has said a few words. He does follow simple demonstrated commands. Not moving right arm. Moves left arm well and has 5/5 grip strength, Appears to be moving lower extremities but unable to follow MMT commands.  Psych: Lethargic, cooperative   Lab Results Last 24 Hours       Results for orders placed or performed during the hospital encounter of 09/05/19 (from the past 24 hour(s))  Glucose, capillary     Status: Abnormal    Collection Time: 09/12/19  8:10 AM  Result Value Ref Range    Glucose-Capillary 121 (H) 70 - 99 mg/dL  CBC     Status: Abnormal    Collection Time: 09/12/19  9:59 AM  Result Value Ref Range    WBC 16.2 (H) 4.0 - 10.5 K/uL    RBC 3.63 (L) 4.22 - 5.81 MIL/uL    Hemoglobin 11.3 (L) 13.0 - 17.0 g/dL    HCT 33.9 (L) 39 - 52 %    MCV 93.4 80.0 - 100.0 fL    MCH 31.1 26.0 - 34.0 pg    MCHC 33.3 30.0 - 36.0 g/dL    RDW 14.6 11.5 - 15.5 %    Platelets 513 (H) 150 - 400 K/uL    nRBC 2.0 (H) 0.0 - 0.2 %  Basic metabolic panel     Status: Abnormal    Collection Time: 09/12/19  9:59 AM  Result Value Ref Range    Sodium 131 (L) 135 - 145 mmol/L    Potassium 3.6 3.5 - 5.1 mmol/L    Chloride 97 (L) 98 - 111 mmol/L    CO2 23 22 - 32 mmol/L    Glucose, Bld 119 (H) 70 - 99 mg/dL    BUN 13 6 - 20 mg/dL    Creatinine, Ser 0.49 (L) 0.61 - 1.24 mg/dL    Calcium 8.7 (L) 8.9 - 10.3 mg/dL    GFR calc non Af Amer >60 >60 mL/min    GFR calc Af Amer >60 >60 mL/min    Anion gap 11 5 - 15  Magnesium     Status: None    Collection Time: 09/12/19  9:59 AM  Result Value Ref Range    Magnesium 2.4 1.7 - 2.4 mg/dL  Phosphorus      Status: None    Collection Time: 09/12/19  9:59 AM  Result Value Ref Range    Phosphorus 3.6 2.5 - 4.6 mg/dL  Glucose, capillary     Status: Abnormal    Collection Time: 09/12/19 11:43 AM  Result Value Ref Range    Glucose-Capillary 133 (H) 70 - 99 mg/dL  Glucose, capillary     Status: Abnormal    Collection Time: 09/12/19  4:11 PM  Result Value Ref Range    Glucose-Capillary 130 (H) 70 - 99 mg/dL  Glucose, capillary     Status: Abnormal    Collection Time: 09/12/19  4:40 PM  Result Value Ref Range    Glucose-Capillary 132 (H) 70 - 99 mg/dL  Glucose, capillary     Status: Abnormal    Collection Time: 09/12/19  9:40 PM  Result Value Ref Range    Glucose-Capillary 134 (H) 70 - 99 mg/dL    Comment  1 Notify RN    CBC     Status: Abnormal    Collection Time: 09/13/19  4:33 AM  Result Value Ref Range    WBC 15.6 (H) 4.0 - 10.5 K/uL    RBC 4.14 (L) 4.22 - 5.81 MIL/uL    Hemoglobin 12.8 (L) 13.0 - 17.0 g/dL    HCT 38.8 (L) 39 - 52 %    MCV 93.7 80.0 - 100.0 fL    MCH 30.9 26.0 - 34.0 pg    MCHC 33.0 30.0 - 36.0 g/dL    RDW 14.8 11.5 - 15.5 %    Platelets 501 (H) 150 - 400 K/uL    nRBC 0.8 (H) 0.0 - 0.2 %       Imaging Results (Last 48 hours)  DG Chest Port 1 View   Result Date: 09/11/2019 CLINICAL DATA:  Respiratory failure, fall, head injury EXAM: PORTABLE CHEST 1 VIEW COMPARISON:  09/06/2019 FINDINGS: The heart size and mediastinal contours are within normal limits. Subtle heterogeneous airspace opacities of the lung bases. The visualized skeletal structures are unremarkable. IMPRESSION: Subtle heterogeneous airspace opacities of the lung bases, suspicious for infection or aspiration. Electronically Signed   By: Eddie Candle M.D.   On: 09/11/2019 10:57         Assessment/Plan: Diagnosis: Bilateral subdural hemorrhage R>L following 20 foot fall from ladder 1. Does the need for close, 24 hr/day medical supervision in concert with the patient's rehab needs make it unreasonable for  this patient to be served in a less intensive setting? Yes 2. Co-Morbidities requiring supervision/potential complications: occipital and clivial midline fractures extending into R phenoid with hemosinus and pneumocephalus, VDRF, L 1/2 R transverse process fractures, leukocytosis, urinary retention 3. Due to bladder management, bowel management, safety, skin/wound care, disease management, medication administration, pain management and patient education, does the patient require 24 hr/day rehab nursing? Yes 4. Does the patient require coordinated care of a physician, rehab nurse, therapy disciplines of PT, OT, SLP to address physical and functional deficits in the context of the above medical diagnosis(es)? Yes Addressing deficits in the following areas: balance, endurance, locomotion, strength, transferring, bowel/bladder control, bathing, dressing, feeding, grooming, toileting, cognition, speech, language, swallowing and psychosocial support 5. Can the patient actively participate in an intensive therapy program of at least 3 hrs of therapy per day at least 5 days per week? Yes 6. The potential for patient to make measurable gains while on inpatient rehab is good 7. Anticipated functional outcomes upon discharge from inpatient rehab are min assist  with PT, min assist with OT, min assist with SLP. 8. Estimated rehab length of stay to reach the above functional goals is: 2-3 weeks 9. Anticipated discharge destination: Home 10. Overall Rehab/Functional Prognosis: good   RECOMMENDATIONS: This patient's condition is appropriate for continued rehabilitative care in the following setting: CIR Patient has agreed to participate in recommended program. N/A (patient unable to verbally respond) Note that insurance prior authorization may be required for reimbursement for recommended care.   Comment: Jason Skinner would be a good CIR candidate. He has intensive PT, OT, and SLP needs. He has the support of his  brother. His wife and 2 children live in [place]. 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 [place] [employer]    Lavon Paganini Angiulli, PA-C 09/13/2019    I have personally performed a face to face diagnostic evaluation, including, but not limited to relevant history and physical exam findings, of this patient and  developed relevant assessment and plan.  Additionally, I have reviewed and concur with the physician assistant's documentation above.   Leeroy Cha, MD        Revision History                     Routing History           Note Details  Author Ranell Patrick, Clide Deutscher, MD File Time 09/13/2019  1:18 PM  Author Type Physician Status Signed  Last Editor [name], MD Service Physical Medicine and [facility] # [id number] Admit Date 09/20/2019

## 2019-09-20 NOTE — Progress Notes (Signed)
Jason Gong, RN  Rehab Admission Coordinator  Physical Medicine and Rehabilitation  PMR Pre-admission      Signed  Date of Service:  09/20/2019 12:29 PM      Related encounter: ED to Hosp-Admission (Discharged) from 09/05/2019 in [facility]       Show:Clear all [x] Manual[x] Template[x] Copied  Added by: [x] Jason Gong, RN  [] Hover for details PMR Admission Coordinator Pre-Admission Assessment   Patient: Jason Skinner is an 29 y.o., male MRN: [id number] DOB: 07-04-1990 Height: 5\' 10"  (177.8 cm) Weight: 68.4 kg                                                                                                                                                  Insurance Information   PRIMARY: uninsured. Patient's Jason Skinner is his employer. 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 Another employee's wife, Jason Skinner ( [place] speaker) is working through with [employer] and insurance on eventual possible Workers compensation. [place] under [employer] policy #[id number] said to have been cancelled 07/18/19. Jason Skinner number  [id number]        Financial Counselor:       Phone#:    The Data Collection Information Summary for patients in Inpatient Rehabilitation Facilities with attached Privacy Act [place] Records was provided and verbally reviewed with: N/A   Emergency Contact Information         Contact Information     Name Relation Home Work Mobile    Jason Skinner Uncle     [phone]    Jason Skinner     [phone]       Current Medical History  Patient Admitting Diagnosis: TBI   History of Present Illness:  Jason Skinner is a 29 year old right-handed limited English speaking male on no prescription medications.   He has been in the [place] for approximately 3 years and has family including a spouse in [place].  He does have a brother and uncle in the local area.   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  Noted positive loss of consciousness.  Cranial CT scan showed right more than left cerebral convexity subdural hematoma with leftward midline shift of 2 cm and a right uncal herniation.  Left temporal pole contusion with overlying subarachnoid hemorrhage.  Occipital and clival midline fractures extending into the right sphenoid sinus with pneumocephalus.  CT of the chest abdomen pelvis showed an L1-L2 right transverse process fracture as well as incidental findings of a 2 cm nodular projection from the right thyroid lobe.  Admission chemistries potassium 3.1 alcohol negative lactic acid 3.1, SARS coronavirus negative.  Patient underwent right frontal temporal parietal craniectomy for evacuation of acute subdural hematoma and insertion of right  frontal ventriculostomy insertion of craniotomy flap into the abdominal subcutaneous tissue 09/05/2019 per Dr. Arnoldo Morale.  Latest follow-up cranial CT scan showed decreased mass-effect of 5 mm leftward midline shift as previously 20 mm.  Conservative care of L1/2 right transverse process fractures.  Patient remained intubated through 09/10/2019.  Bilateral lower extremity Dopplers negative for DVT and upper extremities negative for DVT there was an anterior determinant superficial vein thrombosis involving the left cephalic vein.  He was cleared to begin Lovenox for DVT prophylaxis 09/09/2019.  His diet has been advanced to mechanical soft thin liquids.  Bouts of urinary retention placed on Urecholine.  Mild leukocytosis 14,300 and monitored.  Mild hyponatremia 131 improved to 136 sodium chloride tablets discontinued.     Past Medical History  No past medical history on file.   Family History  family history is not on file.   Prior Rehab/Hospitalizations:  Has the patient had prior rehab or hospitalizations prior to admission? Yes   Has the patient had  major surgery during 100 days prior to admission? Yes   Current Medications    Current Facility-Administered Medications:    0.9 %  sodium chloride infusion (Manually program via Guardrails IV Fluids), , Intravenous, Once, Milford Cage, CRNA, Held at 09/05/19 1324   acetaminophen (TYLENOL) tablet 1,000 mg, 1,000 mg, Per Tube, Q6H PRN, Clovis Riley, MD, 1,000 mg at 09/16/19 0959   bethanechol (URECHOLINE) tablet 25 mg, 25 mg, Per Tube, TID, Georganna Skeans, MD, 25 mg at 09/20/19 4010   Chlorhexidine Gluconate Cloth 2 % PADS 6 each, 6 each, Topical, Daily, Georganna Skeans, MD, 6 each at 09/20/19 0940   enoxaparin (LOVENOX) injection 30 mg, 30 mg, Subcutaneous, BID, Michael Boston, MD, 30 mg at 09/20/19 0956   feeding supplement (ENSURE ENLIVE) (ENSURE ENLIVE) liquid 237 mL, 237 mL, Oral, TID BM, Lovick, Montel Culver, MD, 237 mL at 09/20/19 1007   lidocaine (LIDODERM) 5 % 1 patch, 1 patch, Transdermal, Q24H, Lovick, Montel Culver, MD, 1 patch at 09/20/19 1000   MEDLINE mouth rinse, 15 mL, Mouth Rinse, BID, Georganna Skeans, MD, 15 mL at 09/20/19 1007   methocarbamol (ROBAXIN) tablet 500 mg, 500 mg, Oral, Q8H, Meuth, Brooke A, PA-C, 500 mg at 09/20/19 0557   metoprolol tartrate (LOPRESSOR) injection 5 mg, 5 mg, Intravenous, Q6H PRN, Saverio Danker, PA-C   ondansetron (ZOFRAN-ODT) disintegrating tablet 4 mg, 4 mg, Oral, Q6H PRN **OR** ondansetron (ZOFRAN) injection 4 mg, 4 mg, Intravenous, Q6H PRN, Saverio Danker, PA-C   oxyCODONE (ROXICODONE) 5 MG/5ML solution 5-10 mg, 5-10 mg, Oral, Q4H PRN, Judeth Horn, MD, 5 mg at 09/20/19 0956   sodium chloride tablet 1 g, 1 g, Oral, BID WC, Jesusita Oka, MD, 1 g at 09/20/19 2725   Patients Current Diet:     Diet Order                      DIET DYS 3 Room service appropriate? Yes; Fluid consistency: Thin  Diet effective now                      Precautions / Restrictions Precautions Precautions: Fall Precaution Comments: bone flap in  abdomen, helmet when OOB Cervical Brace: Hard collar, At all times Other Brace: helmet for OOB  Restrictions Weight Bearing Restrictions: No    Has the patient had 2 or more falls or a fall with injury in the past year?Yes   Prior Activity Level Community (5-7x/wk): independent; working  with his Uncle   Prior Functional Level Prior Function Level of Independence: Independent Comments: wife and 2 daughters in [place]   Self Care: Did the patient need help bathing, dressing, using the toilet or eating?  Independent   Indoor Mobility: Did the patient need assistance with walking from room to room (with or without device)? Independent   Stairs: Did the patient need assistance with internal or external stairs (with or without device)? Independent   Functional Cognition: Did the patient need help planning regular tasks such as shopping or remembering to take medications? Independent   Home Assistive Devices / Equipment Home Assistive Devices/Equipment: None Home Equipment: None   Prior Device Use: Indicate devices/aids used by the patient prior to current illness, exacerbation or injury? None of the above   Current Functional Level Cognition   Arousal/Alertness: Awake/alert Overall Cognitive Status: Impaired/Different from baseline Current Attention Level: Sustained Orientation Level: Oriented X4 Following Commands: Follows one step commands with increased time, Follows multi-step commands with increased time, Follows one step commands consistently Safety/Judgement: Decreased awareness of safety, Decreased awareness of deficits General Comments: following commands for in bed therex and cues for visual target during ambulation Attention: Focused, Sustained Focused Attention: Appears intact Sustained Attention: Appears intact Memory: Impaired Memory Impairment: Storage deficit, Decreased short term memory, Decreased recall of new information Decreased Short Term Memory: Verbal  basic Awareness: Impaired Awareness Impairment: Intellectual impairment, Emergent impairment Problem Solving: Impaired Problem Solving Impairment: Verbal basic Executive Function: Reasoning, Initiating, Self Monitoring Reasoning: Impaired Reasoning Impairment: Verbal basic Initiating: Impaired Initiating Impairment: Verbal basic, Functional basic Self Monitoring: Impaired Self Monitoring Impairment: Verbal basic, Functional basic [place] [employer] Scales of Cognitive Functioning: Confused/appropriate    Extremity Assessment (includes Sensation/Coordination)   Upper Extremity Assessment: RUE deficits/detail RUE Deficits / Details: Pt able to move hand to forehead spontaneously, but demonstrates 1+/5 Rt shoulder  LUE Deficits / Details: WFL  Lower Extremity Assessment: Defer to PT evaluation RLE Deficits / Details: generalized weakness, ataxia with stepping     ADLs   Overall ADL's : Needs assistance/impaired Eating/Feeding: Moderate assistance, Sitting Eating/Feeding Details (indicate cue type and reason): pt undershooting when self feeding. pt using L hand instead of right initially.  Grooming: Wash/dry face, Standing, Supervision/safety, Set up Grooming Details (indicate cue type and reason): tactile cues needed to initiate task from standing at sink. pt with improved ideational apraxia. pt reports dizziness upon standing at sink needing to sit on BSC Upper Body Bathing: Maximal assistance Lower Body Bathing: Total assistance Upper Body Dressing : Maximal assistance Lower Body Dressing: Total assistance Toilet Transfer: Minimal assistance, +2 for safety/equipment, +2 for physical assistance, Ambulation Toilet Transfer Details (indicate cue type and reason): simulated via functional mobility, mobility limited d/t reports of dizziness Toileting- Clothing Manipulation and Hygiene: Maximal assistance, Sit to/from stand Toileting - Clothing Manipulation Details (indicate cue type and  reason): Pt incontinent of urine.  He was able to assist with peri care, but demonstrates decreased thoroughness  Functional mobility during ADLs: Minimal assistance, Moderate assistance, +2 for physical assistance, +2 for safety/equipment General ADL Comments: noted improvement with ideational apraxia able to wash face at sink, limited by dizziness this session. pt gesturing and talking more     Mobility   Overal bed mobility: Needs Assistance Bed Mobility: Rolling, Sidelying to Sit Rolling: Min guard Sidelying to sit: Min assist Supine to sit: Mod assist Sit to supine: Mod assist, +2 for physical assistance General bed mobility comments: rolling to L with cues for  technique     Transfers   Overall transfer level: Needs assistance Equipment used: Rolling walker (2 wheeled) Transfers: Sit to/from Stand Sit to Stand: Min assist Stand pivot transfers: Mod assist General transfer comment: assist for balance, cues for visual fixation to aide with dizziness     Ambulation / Gait / Stairs / Wheelchair Mobility   Ambulation/Gait Ambulation/Gait assistance: Mod assist Gait Distance (Feet): 100 Feet Assistive device: Rolling walker (2 wheeled) Gait Pattern/deviations: Step-through pattern, Decreased stride length, Drifts right/left General Gait Details: assist for posture, walker management and cues for focusing on visual target for managing dizziness, wearing helmet, cervical collar and eye patch on R eye Gait velocity: reduced Gait velocity interpretation: <1.8 ft/sec, indicate of risk for recurrent falls     Posture / Balance Dynamic Sitting Balance Sitting balance - Comments: donned socks sitting EOB with min A Balance Overall balance assessment: Needs assistance Sitting-balance support: No upper extremity supported, Feet supported Sitting balance-Leahy Scale: Good Sitting balance - Comments: donned socks sitting EOB with min A Postural control: Right lateral lean Standing balance  support: Bilateral upper extremity supported Standing balance-Leahy Scale: Poor Standing balance comment: UE support, tends to lean to R     Special needs/care consideration Designated visitors are brother, [name] and [place], [place] Bone Flap in abdomen Helmet for out of bed Spanish Interpreter for therapy    Previous Home Environment  Living Arrangements: Alone  Lives With: Alone (per brother, [place]) Available Help at Discharge: Family, Available 24 hours/day (Brother and Interior and spatial designer and other friends to provide 24/7 supervis) Type of Home: Mobile home Home Layout: One level Home Access: Stairs to enter Entrance Stairs-Rails: Left, Right Entrance Stairs-Number of Steps: 4-5 Bathroom Shower/Tub: Chiropodist: Standard Bathroom Accessibility: Yes How Accessible: Accessible via walker Home Care Services: No Additional Comments: 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 Brother is here.    Discharge Living Setting Plans for Discharge Living Setting: Alone, Mobile Home Type of Home at Discharge: Mobile home Discharge Home Layout: One level Discharge Home Access: Stairs to enter Entrance Stairs-Rails: Right, Left Entrance Stairs-Number of Steps: 4 to 5 Discharge Bathroom Shower/Tub: Tub/shower unit Discharge Bathroom Toilet: Standard Discharge Bathroom Accessibility: Yes How Accessible: Accessible via walker Does the patient have any problems obtaining your medications?: Yes (Describe) (uninsured)   Social/Family/Support Systems Patient Roles: Spouse, Parent Contact Information: brother and Uncle Anticipated Caregiver: brother, [place], and [place], [place] Anticipated Caregiver's Contact Information: see above Ability/Limitations of Caregiver: both work but will arrnage 24/7 supervision with family and  friends Caregiver Availability: 24/7 Discharge Plan Discussed with Primary Caregiver: Yes Is Caregiver In Agreement with Plan?: Yes Does Caregiver/Family have Issues with Lodging/Transportation while Pt is in Rehab?: No   Goals Patient/Family Goal for Rehab: supervision with PT, OT, and SLP Expected length of stay: [facility] 2 to 3 weeks Cultural Considerations: In [place] for 3 years without VISA. Non English speaking; Spanish Additional Information: Wife in [place] and [place] denied to come; family now seeking for his Mother to obtain HU[employer] VISA Pt/Family Agrees to Admission and willing to participate: Yes Program Orientation Provided & Reviewed with Pt/Caregiver Including Roles  & Responsibilities: Yes Additional Information Needs: [place] staying in room with patient 8 am until 8 pm   Decrease burden  of Care through IP rehab admission: n/a   Possible need for SNF placement upon discharge:not anticipated   Patient Condition: This patient's medical and functional status has changed since the consult dated: 09/13/2019 in which the Rehabilitation Physician determined and documented that the patient's condition is appropriate for intensive rehabilitative care in an inpatient rehabilitation facility. See "History of Present Illness" (above) for medical update. Functional changes are: mod assist. Patient's medical and functional status update has been discussed with the Rehabilitation physician and patient remains appropriate for inpatient rehabilitation. Will admit to inpatient rehab today.   Preadmission Screen Completed By:  Cleatrice Burke, RN, 09/20/2019 12:30 PM ______________________________________________________________________   Discussed status with Dr. Naaman Plummer on 09/20/2019 at  1238 and received approval for admission today.   Admission Coordinator:  Cleatrice Burke, time 1761 Date 09/20/2019             Cosigned by: Meredith Staggers, MD at 09/20/2019  12:43 PM  Revision History                Note Details  Author Jason Gong, RN File Time 09/20/2019 12:39 PM  Author Type Rehab Admission Coordinator Status Signed  Last Editor Jason Gong, RN Service Physical Medicine and [place] # [id number] Admit Date 09/20/2019

## 2019-09-20 NOTE — Progress Notes (Signed)
Report received from [facility] from [facility]. Patient is alert X4 but does not speak much english. Crush meds, patch for left eye to help with opening right eye, helmet when OOB, no other skin issues just staples on head and abdomen where part of the skull is in the abdomen. Patient has been sleeping most of the day. Patient has headache at times.  Patient arrived to unit about 1500 via bed by staff. Patient had on neck brace and was very tired. Patient able to assist with bed mobility for turning with head to toe assessment and answering simple questions. After assessment completed, writer asked if patient was okay at this time and patient stated yes. Helmet and eye patch are at bedside.

## 2019-09-20 NOTE — Progress Notes (Signed)
  Speech Language Pathology Treatment: Cognitive-Linquistic  Patient Details Name: Paton Crum MRN: [id number] DOB: Jan 09, 1991 Today's Date: 09/20/2019 Time: 1950-9326 SLP Time Calculation (min) (ACUTE ONLY): 12 min  Assessment / Plan / Recommendation Clinical Impression  Pt making progress with cognitive function, he presents as a [place] (confused, appropriate). He continues to struggle with short term memory and needed multiple repeated cues to cultivate an awareness of the time of day and what meal he had just eaten with heavy reasoning and use of clock for visual reinforcement. Pt with poorly sustained attention to these verbal tasks due to easy distractibility from discomfort from cervical collar. Pt repeatedly trying to remove it despite cueing and verbal reasoning for purpose. It is obviously ill fitting and digging into this shoulder. He is grimacing in pain. He has been found by his brother with the collar off frequently in the room. SLP reinforced need for collar and checked in with Trauma team to see if another more comfortable collar would be beneficial, particularly since it is limiting his ability to participate in therapy.   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  Per report +LOC after fall, then patient was alert and oriented. He decompensated with EMS and was upgraded to a level 1, intubated en route. Found to have Right subdural hematoma, occipital skull fracture, clivus fracture, L1/2 R tranverse process fx, traumatic brain injury. Underwent  Right frontotemporoparietal craniectomy for evacuation of acute subdural hematoma; insertion of right frontal ventriculostomy; insertion of craniotomy flap into the abdominal subcutaneous tissue. Extubated 7/5.       SLP Plan  Continue with current plan of care       Recommendations                   Plan: Continue with  current plan of care       GO              Herbie Baltimore, MA [place] Pager [phone] Office [phone]   Lynann Beaver 09/20/2019, 10:36 AM

## 2019-09-20 NOTE — PMR Pre-admission (Signed)
PMR Admission Coordinator Pre-Admission Assessment  Patient: Jason Skinner is an 29 y.o., male MRN: [id number] DOB: 08-Jun-1990 Height: 5\' 10"  (177.8 cm) Weight: 68.4 kg              Insurance Information  PRIMARY: uninsured. Patient's [employer] is his employer. 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 Another employee's wife, Anderson [place] ( [place] speaker) is working through with [employer] and insurance on eventual possible Workers compensation. [place] under [employer] policy #[id number] said to have been cancelled 07/18/19. Anderson Malta number  [phone]       Financial Counselor:       Phone#:   The "Data Collection Information Summary" for patients in Inpatient Rehabilitation Facilities with attached "Privacy Act [place] Records" was provided and verbally reviewed with: N/A  Emergency Contact Information Contact Information    Name Relation Home Work Mobile   [facility] Uncle   [phone]   Cora Daniels   [phone]     Current Medical History  Patient Admitting Diagnosis: TBI  History of Present Illness:  Jason Skinner is a 29 year old right-handed limited English speaking male on no prescription medications.   He has been in the [place] for approximately 3 years and has family including a spouse in [place].  He does have a brother and uncle in the local area.  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  Noted positive loss of consciousness.  Cranial CT scan showed right more than left cerebral convexity subdural hematoma with leftward midline shift of 2 cm and a right uncal herniation.  Left temporal pole contusion with overlying subarachnoid hemorrhage.  Occipital and clival midline fractures extending into the right sphenoid sinus with pneumocephalus.  CT of the chest abdomen pelvis showed an L1-L2 right transverse process fracture as well as  incidental findings of a 2 cm nodular projection from the right thyroid lobe.  Admission chemistries potassium 3.1 alcohol negative lactic acid 3.1, SARS coronavirus negative.  Patient underwent right frontal temporal parietal craniectomy for evacuation of acute subdural hematoma and insertion of right frontal ventriculostomy insertion of craniotomy flap into the abdominal subcutaneous tissue 09/05/2019 per Dr. Arnoldo Morale.  Latest follow-up cranial CT scan showed decreased mass-effect of 5 mm leftward midline shift as previously 20 mm.  Conservative care of L1/2 right transverse process fractures.  Patient remained intubated through 09/10/2019.  Bilateral lower extremity Dopplers negative for DVT and upper extremities negative for DVT there was an anterior determinant superficial vein thrombosis involving the left cephalic vein.  He was cleared to begin Lovenox for DVT prophylaxis 09/09/2019.  His diet has been advanced to mechanical soft thin liquids.  Bouts of urinary retention placed on Urecholine.  Mild leukocytosis 14,300 and monitored.  Mild hyponatremia 131 improved to 136 sodium chloride tablets discontinued.    Past Medical History  No past medical history on file.  Family History  family history is not on file.  Prior Rehab/Hospitalizations:  Has the patient had prior rehab or hospitalizations prior to admission? Yes  Has the patient had major surgery during 100 days prior to admission? Yes  Current Medications   Current Facility-Administered Medications:  .  0.9 %  sodium chloride infusion (Manually program via Guardrails IV Fluids), , Intravenous, Once, Milford Cage, CRNA, Held at 09/05/19 3419 .  acetaminophen (TYLENOL) tablet 1,000 mg, 1,000 mg, Per Tube, Q6H PRN, Romana Juniper A, MD, 1,000 mg at 09/16/19  3244 .  bethanechol (URECHOLINE) tablet 25 mg, 25 mg, Per Tube, TID, Georganna Skeans, MD, 25 mg at 09/20/19 0956 .  Chlorhexidine Gluconate Cloth 2 % PADS 6 each, 6 each, Topical,  Daily, Georganna Skeans, MD, 6 each at 09/20/19 0940 .  enoxaparin (LOVENOX) injection 30 mg, 30 mg, Subcutaneous, BID, Michael Boston, MD, 30 mg at 09/20/19 0956 .  feeding supplement (ENSURE ENLIVE) (ENSURE ENLIVE) liquid 237 mL, 237 mL, Oral, TID BM, Lovick, Montel Culver, MD, 237 mL at 09/20/19 1007 .  lidocaine (LIDODERM) 5 % 1 patch, 1 patch, Transdermal, Q24H, Lovick, Montel Culver, MD, 1 patch at 09/20/19 1000 .  MEDLINE mouth rinse, 15 mL, Mouth Rinse, BID, Georganna Skeans, MD, 15 mL at 09/20/19 1007 .  methocarbamol (ROBAXIN) tablet 500 mg, 500 mg, Oral, Q8H, Meuth, Brooke A, PA-C, 500 mg at 09/20/19 0557 .  metoprolol tartrate (LOPRESSOR) injection 5 mg, 5 mg, Intravenous, Q6H PRN, Saverio Danker, PA-C .  ondansetron (ZOFRAN-ODT) disintegrating tablet 4 mg, 4 mg, Oral, Q6H PRN **OR** ondansetron (ZOFRAN) injection 4 mg, 4 mg, Intravenous, Q6H PRN, Saverio Danker, PA-C .  oxyCODONE (ROXICODONE) 5 MG/5ML solution 5-10 mg, 5-10 mg, Oral, Q4H PRN, Judeth Horn, MD, 5 mg at 09/20/19 0956 .  sodium chloride tablet 1 g, 1 g, Oral, BID WC, Jesusita Oka, MD, 1 g at 09/20/19 0102  Patients Current Diet:  Diet Order            DIET DYS 3 Room service appropriate? Yes; Fluid consistency: Thin  Diet effective now                 Precautions / Restrictions Precautions Precautions: Fall Precaution Comments: bone flap in abdomen, helmet when OOB Cervical Brace: Hard collar, At all times Other Brace: helmet for OOB  Restrictions Weight Bearing Restrictions: No   Has the patient had 2 or more falls or a fall with injury in the past year?Yes  Prior Activity Level Community (5-7x/wk): independent; working with his Uncle  Prior Functional Level Prior Function Level of Independence: Independent Comments: wife and 2 daughters in [place]  Self Care: Did the patient need help bathing, dressing, using the toilet or eating?  Independent  Indoor Mobility: Did the patient need assistance with walking  from room to room (with or without device)? Independent  Stairs: Did the patient need assistance with internal or external stairs (with or without device)? Independent  Functional Cognition: Did the patient need help planning regular tasks such as shopping or remembering to take medications? Independent  Home Assistive Devices / Equipment Home Assistive Devices/Equipment: None Home Equipment: None  Prior Device Use: Indicate devices/aids used by the patient prior to current illness, exacerbation or injury? None of the above  Current Functional Level Cognition  Arousal/Alertness: Awake/alert Overall Cognitive Status: Impaired/Different from baseline Current Attention Level: Sustained Orientation Level: Oriented X4 Following Commands: Follows one step commands with increased time, Follows multi-step commands with increased time, Follows one step commands consistently Safety/Judgement: Decreased awareness of safety, Decreased awareness of deficits General Comments: following commands for in bed therex and cues for visual target during ambulation Attention: Focused, Sustained Focused Attention: Appears intact Sustained Attention: Appears intact Memory: Impaired Memory Impairment: Storage deficit, Decreased short term memory, Decreased recall of new information Decreased Short Term Memory: Verbal basic Awareness: Impaired Awareness Impairment: Intellectual impairment, Emergent impairment Problem Solving: Impaired Problem Solving Impairment: Verbal basic Executive Function: Reasoning, Initiating, Self Monitoring Reasoning: Impaired Reasoning Impairment: Verbal basic Initiating: Impaired Initiating Impairment: Verbal  basic, Functional basic Self Monitoring: Impaired Self Monitoring Impairment: Verbal basic, Functional basic Rancho Build[employer].es Scales of Cognitive Functioning: Confused/appropriate    Extremity Assessment (includes Sensation/Coordination)  Upper Extremity Assessment:  RUE deficits/detail RUE Deficits / Details: Pt able to move hand to forehead spontaneously, but demonstrates 1+/5 Rt shoulder  LUE Deficits / Details: WFL  Lower Extremity Assessment: Defer to PT evaluation RLE Deficits / Details: generalized weakness, ataxia with stepping    ADLs  Overall ADL's : Needs assistance/impaired Eating/Feeding: Moderate assistance, Sitting Eating/Feeding Details (indicate cue type and reason): pt undershooting when self feeding. pt using L hand instead of right initially.  Grooming: Wash/dry face, Standing, Supervision/safety, Set up Grooming Details (indicate cue type and reason): tactile cues needed to initiate task from standing at sink. pt with improved ideational apraxia. pt reports dizziness upon standing at sink needing to sit on BSC Upper Body Bathing: Maximal assistance Lower Body Bathing: Total assistance Upper Body Dressing : Maximal assistance Lower Body Dressing: Total assistance Toilet Transfer: Minimal assistance, +2 for safety/equipment, +2 for physical assistance, Ambulation Toilet Transfer Details (indicate cue type and reason): simulated via functional mobility, mobility limited d/t reports of dizziness Toileting- Clothing Manipulation and Hygiene: Maximal assistance, Sit to/from stand Toileting - Clothing Manipulation Details (indicate cue type and reason): Pt incontinent of urine.  He was able to assist with peri care, but demonstrates decreased thoroughness  Functional mobility during ADLs: Minimal assistance, Moderate assistance, +2 for physical assistance, +2 for safety/equipment General ADL Comments: noted improvement with ideational apraxia able to wash face at sink, limited by dizziness this session. pt gesturing and talking more    Mobility  Overal bed mobility: Needs Assistance Bed Mobility: Rolling, Sidelying to Sit Rolling: Min guard Sidelying to sit: Min assist Supine to sit: Mod assist Sit to supine: Mod assist, +2 for physical  assistance General bed mobility comments: rolling to L with cues for technique    Transfers  Overall transfer level: Needs assistance Equipment used: Rolling walker (2 wheeled) Transfers: Sit to/from Stand Sit to Stand: Min assist Stand pivot transfers: Mod assist General transfer comment: assist for balance, cues for visual fixation to aide with dizziness    Ambulation / Gait / Stairs / Wheelchair Mobility  Ambulation/Gait Ambulation/Gait assistance: Mod assist Gait Distance (Feet): 100 Feet Assistive device: Rolling walker (2 wheeled) Gait Pattern/deviations: Step-through pattern, Decreased stride length, Drifts right/left General Gait Details: assist for posture, walker management and cues for focusing on visual target for managing dizziness, wearing helmet, cervical collar and eye patch on R eye Gait velocity: reduced Gait velocity interpretation: <1.8 ft/sec, indicate of risk for recurrent falls    Posture / Balance Dynamic Sitting Balance Sitting balance - Comments: donned socks sitting EOB with min A Balance Overall balance assessment: Needs assistance Sitting-balance support: No upper extremity supported, Feet supported Sitting balance-Leahy Scale: Good Sitting balance - Comments: donned socks sitting EOB with min A Postural control: Right lateral lean Standing balance support: Bilateral upper extremity supported Standing balance-Leahy Scale: Poor Standing balance comment: UE support, tends to lean to R    Special needs/care consideration Designated visitors are brother, [name] and [place], [place] Flap in abdomen Helmet for out of bed Spanish Interpreter for therapy   Previous Home Environment  Living Arrangements: Alone  Lives With: Alone (per brother, [place]) Available Help at Discharge: Family, Available 24 hours/day (Brother and Interior and spatial designer and other friends to provide 24/7 supervis) Type of Home: Mobile home Home Layout: One level Home Access: Stairs  to enter Entrance  Stairs-Rails: Left, Right Entrance Stairs-Number of Steps: 4-5 Bathroom Shower/Tub: Chiropodist: Standard Bathroom Accessibility: Yes How Accessible: Accessible via walker Home Care Services: No Additional Comments: 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 Brother is here.   Discharge Living Setting Plans for Discharge Living Setting: Alone, Mobile Home Type of Home at Discharge: Mobile home Discharge Home Layout: One level Discharge Home Access: Stairs to enter Entrance Stairs-Rails: Right, Left Entrance Stairs-Number of Steps: 4 to 5 Discharge Bathroom Shower/Tub: Tub/shower unit Discharge Bathroom Toilet: Standard Discharge Bathroom Accessibility: Yes How Accessible: Accessible via walker Does the patient have any problems obtaining your medications?: Yes (Describe) (uninsured)  Social/Family/Support Systems Patient Roles: Spouse, Parent Contact Information: brother and Uncle Anticipated Caregiver: brother, [place], and [place], [place] Anticipated Caregiver's Contact Information: see above Ability/Limitations of Caregiver: both work but will arrnage 24/7 supervision with family and friends Caregiver Availability: 24/7 Discharge Plan Discussed with Primary Caregiver: Yes Is Caregiver In Agreement with Plan?: Yes Does Caregiver/Family have Issues with Lodging/Transportation while Pt is in Rehab?: No  Goals Patient/Family Goal for Rehab: supervision with PT, OT, and SLP Expected length of stay: ELOS 2 to 3 weeks Cultural Considerations: In [place] for 3 years without VISA. Non English speaking; Spanish Additional Information: Wife in [place] and Christmas denied to come; family now seeking for his Mother to obtain HUmanitarian VISA Pt/Family Agrees to Admission and willing to participate:  Yes Program Orientation Provided & Reviewed with Pt/Caregiver Including Roles  & Responsibilities: Yes Additional Information Needs: [name] and [place] staying in room with patient 8 am until 8 pm  Decrease burden of Care through IP rehab admission: n/a  Possible need for SNF placement upon discharge:not anticipated  Patient Condition: This patient's medical and functional status has changed since the consult dated: 09/13/2019 in which the Rehabilitation Physician determined and documented that the patient's condition is appropriate for intensive rehabilitative care in an inpatient rehabilitation facility. See "History of Present Illness" (above) for medical update. Functional changes are: mod assist. Patient's medical and functional status update has been discussed with the Rehabilitation physician and patient remains appropriate for inpatient rehabilitation. Will admit to inpatient rehab today.  Preadmission Screen Completed By:  Cleatrice Burke, RN, 09/20/2019 12:30 PM ______________________________________________________________________   Discussed status with Dr. Naaman Plummer on 09/20/2019 at  1238 and received approval for admission today.  Admission Coordinator:  Cleatrice Burke, time 9924 Date 09/20/2019

## 2019-09-20 NOTE — Discharge Summary (Signed)
    Patient ID: Jason Skinner [id number] 12-Jul-1990 29 y.o.  Admit date: 09/05/2019 Discharge date: 09/20/2019  Admitting Diagnosis: Fall B SDH R>L with midline shift and uncal herniation, L temporal contusion and SAH Occipital and clival midline fxs extending into R sphenoid with hemosinus and pneumocephalus VDRF Atelectasis and/or aspiration  L1/2 R TP fxs  Right elbow deformity  Discharge Diagnosis Fall B SDH R>L with midline shift and uncal herniation, L temporal contusion and SAH- Occipital and clival midline fxs extending into R sphenoid with hemosinus and pneumocephalus L1/2 R TP fxs superficial cephalic v thromb  Consultants Neurosurgery   Procedures Dr. 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  Per report +LOC after fall, then patient was alert and oriented. He decompensated with EMS and was upgraded to a level 1. On our arrival patient was moving all 4 extremities, unresponsive. Pupils unequal R>L. He was intubated by EDP. He was taken to the CT scan where he was found to have Bilateral SDH R>L with midline shift and uncal herniation, L temporal contusion and SAH; Occipital and clival midline fxs extending into R sphenoid with hemosinus and pneumocephalus; as well as L1/2 R TP fxs. Neurosurgery was consulted and took the patient for an emergent craniectomy and subdural evacuation as noted above. Patient was weaned from the vent and extubated on 7/5. He was transferred out of the unit. He developed urinary retention during hospitalization but was able to have foley removed after being placed on urecholine.  Patient worked with therapies who recommended  CIR. He passed for a DYS 3 diet and tolerated. Patients mental status continued to gradually improve during hospitalization. On 7/15, the patient was voiding well, tolerating diet, working well with therapies, pain well controlled, vital signs stable, incisions c/d/i and felt stable for discharge to CIR.     Physical Exam: Gen: Alert, NAD HEENT:Pupils reactive to light. Scalp incision cdi with staples intact and no erythema or drainage.  Neck: C-Collar in place Card:RRR, 2+ DP pulses Pulm: CTAB, no W/R/R, rate and effort normal on room air Abd: Soft, NT/ND, +BS, incision cdi with steri strips in place and no erythema or drainage Ext: no BUE/BLE edema, calves soft and nontender Neuro: Alert, oriented to self, location (, hospital), year, andsituation.Follows commands with BUE/BLE Skin: warm and dry  Allergies as of 09/20/2019   No Known Allergies   Med Rec per CIR     Follow-up Information    Newman Pies, MD. Call.   Specialty: Neurosurgery Why: for follow up Contact information: [address] [place] [address] [phone]        [place]. Call.   Why: As needed Contact information: [address] [id number] [phone]                  Signed: Alferd Apa, [facility] Surgery 09/20/2019, 1:42 PM Please see Amion for pager number during day hours 7:00am-4:30pm

## 2019-09-20 NOTE — Progress Notes (Signed)
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 Pt is found to have B SDH R>L with midline shift and uncal herniation, L temporal contusion and SAH, Occipital and clivial midline fxs extending into R sphenoid with hemosinus and pneumocephalus, respiratory failure, atelectasis, L1-2 TP fxs. Pt was intubated on 6/30. Pt underwent R frontotemporoparietal craniectomy for evacuation of acute subdural hematoma; insertion of right frontal ventriculostomy; insertion of craniotomy flap into the abdominal subcutaneous tissue. Pt extubated on 7/5.    PT Comments    Patient progressing with ambulation distance and more fluid with HHA, and wall rail this session.  Still difficulty keeping his eye open though [name], brother in the room, reports vision is better out of L eye.  Encouraged again to switch eyes with patch, but pt done with it after session removing it completely.  He also had removed c-collar initially so changed pads possibly for improved comfort.  Plans for d/c to CIR this pm.  PT to follow up in next venue.    Follow Up Recommendations  CIR     Equipment Recommendations  Rolling walker with 5" wheels;3in1 (PT)    Recommendations for Other Services       Precautions / Restrictions Precautions Precautions: Fall Precaution Comments: bone flap in abdomen, helmet when OOB Required Braces or Orthoses: Cervical Brace;Other Brace Cervical Brace: Hard collar;At all times    Mobility  Bed Mobility Overal bed mobility: Needs Assistance Bed Mobility: Supine to Sit;Sit to Supine   Sidelying to sit: Min guard   Sit to supine: Supervision   General bed mobility comments: rolling to L with cues for technique; assist for safety with lines to supine; work to replace  pads in c-collar while in supine as pt had removed the brace, educated about why it is important to wear based on his injuries  Transfers Overall transfer level: Needs assistance Equipment used: 1 person hand held assist Transfers: Sit to/from Stand Sit to Stand: Min assist         General transfer comment: assist for balance, cues for visual fixation to aide with dizziness  Ambulation/Gait Ambulation/Gait assistance: Mod assist Gait Distance (Feet): 150 Feet Assistive device: 1 person hand held assist (versus wall rail) Gait Pattern/deviations: Step-to pattern;Step-through pattern;Decreased stride length;Drifts right/left;Trunk flexed     General Gait Details: leaning to R at times max cues throughout for visual targeting and to keep R eye open as patch over L eye for mobility today; walked about 15' x 2 holding wall rail with more min A for ambulation   Stairs             Wheelchair Mobility    Modified Rankin (Stroke Patients Only)       Balance Overall balance assessment: Needs assistance   Sitting balance-Leahy Scale: Good   Postural control: Right lateral lean Standing balance support: Single extremity supported Standing balance-Leahy Scale: Poor Standing balance comment: UE support for balance                            Cognition Arousal/Alertness: Awake/alert Behavior During Therapy: Flat affect Overall Cognitive Status: Impaired/Different from baseline Area of Impairment: Attention;Memory;Following commands;Safety/judgement;Problem solving;Rancho level  Rancho Levels of Cognitive Functioning Rancho Los Amigos Scales of Cognitive Functioning: Confused/appropriate   Current Attention Level: Sustained Memory: Decreased short-term memory Following Commands: Follows one step commands with increased time;Follows multi-step commands with increased time;Follows one step commands consistently Safety/Judgement: Decreased  awareness of safety;Decreased awareness of deficits Awareness: Intellectual Problem Solving: Slow processing;Difficulty sequencing;Requires verbal cues        Exercises      General Comments General comments (skin integrity, edema, etc.): Graciella present for interrpeting.  [name] in the room, questions if a better brace he can wear for his neck, deferred to MD.  Encouraged again for OOB if headache 5/10 or better, deferred today due to imminent d/c to CIR      Pertinent Vitals/Pain Pain Assessment: Faces Pain Score: 5  Pain Location: head Pain Descriptors / Indicators: Headache Pain Intervention(s): Monitored during session;Repositioned    Home Living                      Prior Function            PT Goals (current goals can now be found in the care plan section) Progress towards PT goals: Progressing toward goals    Frequency    Min 5X/week      PT Plan Current plan remains appropriate    Co-evaluation              AM-PAC PT "6 Clicks" Mobility   Outcome Measure  Help needed turning from your back to your side while in a flat bed without using bedrails?: A Little Help needed moving from lying on your back to sitting on the side of a flat bed without using bedrails?: A Little Help needed moving to and from a bed to a chair (including a wheelchair)?: A Little Help needed standing up from a chair using your arms (e.g., wheelchair or bedside chair)?: A Little Help needed to walk in hospital room?: A Lot Help needed climbing 3-5 steps with a railing? : A Lot 6 Click Score: 16    End of Session Equipment Utilized During Treatment: Gait belt;Cervical collar Activity Tolerance: Patient limited by fatigue Patient left: in bed;with call bell/phone within reach;with family/visitor present   PT Visit Diagnosis: Other abnormalities of gait and mobility (R26.89);Ataxic gait (R26.0);Other symptoms and signs involving the nervous system (R29.898)     Time:  3545-6256 PT Time Calculation (min) (ACUTE ONLY): 28 min  Charges:  $Gait Training: 8-22 mins $Self Care/Home Management: [place], PT Acute Rehabilitation Services Pager:[phone] Office:[phone] 09/20/2019    Reginia Naas 09/20/2019, 5:03 PM

## 2019-09-20 NOTE — Progress Notes (Addendum)
Inpatient Rehabilitation Admissions Coordinator  CIR bed is aviaalble to admit patient today. I met with patient, brother at bedside with pt's wife via facetime with Rob Bunting, interpreter. Trauma PA, Legrand Como and [facility] team made aware. Workers compensation case has not confirmed therefore I will proceed with admit.  Danne Baxter, RN, MSN Rehab Admissions Coordinator [phone] 09/20/2019 11:25 AM

## 2019-09-20 NOTE — Progress Notes (Signed)
Report given to 4W RN. All room belongings packed and pt escorted to 4W in bed by RN and NT.   Justice Rocher, RN

## 2019-09-21 ENCOUNTER — Inpatient Hospital Stay (HOSPITAL_COMMUNITY): Payer: Self-pay

## 2019-09-21 ENCOUNTER — Inpatient Hospital Stay (HOSPITAL_COMMUNITY): Payer: Self-pay | Admitting: Speech Pathology

## 2019-09-21 ENCOUNTER — Inpatient Hospital Stay (HOSPITAL_COMMUNITY): Payer: Self-pay | Admitting: Physical Therapy

## 2019-09-21 DIAGNOSIS — S32009S Unspecified fracture of unspecified lumbar vertebra, sequela: Secondary | ICD-10-CM

## 2019-09-21 LAB — CBC WITH DIFFERENTIAL/PLATELET
Abs Immature Granulocytes: 0.04 10*3/uL (ref 0.00–0.07)
Basophils Absolute: 0.1 10*3/uL (ref 0.0–0.1)
Basophils Relative: 1 %
Eosinophils Absolute: 0.2 10*3/uL (ref 0.0–0.5)
Eosinophils Relative: 2 %
HCT: 39.5 % (ref 39.0–52.0)
Hemoglobin: 13.1 g/dL (ref 13.0–17.0)
Immature Granulocytes: 0 %
Lymphocytes Relative: 21 %
Lymphs Abs: 2.5 10*3/uL (ref 0.7–4.0)
MCH: 31.7 pg (ref 26.0–34.0)
MCHC: 33.2 g/dL (ref 30.0–36.0)
MCV: 95.6 fL (ref 80.0–100.0)
Monocytes Absolute: 0.8 10*3/uL (ref 0.1–1.0)
Monocytes Relative: 7 %
Neutro Abs: 8.2 10*3/uL — ABNORMAL HIGH (ref 1.7–7.7)
Neutrophils Relative %: 69 %
Platelets: 729 10*3/uL — ABNORMAL HIGH (ref 150–400)
RBC: 4.13 MIL/uL — ABNORMAL LOW (ref 4.22–5.81)
RDW: 14.9 % (ref 11.5–15.5)
WBC: 11.9 10*3/uL — ABNORMAL HIGH (ref 4.0–10.5)
nRBC: 0 % (ref 0.0–0.2)

## 2019-09-21 LAB — COMPREHENSIVE METABOLIC PANEL
ALT: 66 U/L — ABNORMAL HIGH (ref 0–44)
AST: 33 U/L (ref 15–41)
Albumin: 3.8 g/dL (ref 3.5–5.0)
Alkaline Phosphatase: 71 U/L (ref 38–126)
Anion gap: 11 (ref 5–15)
BUN: 16 mg/dL (ref 6–20)
CO2: 29 mmol/L (ref 22–32)
Calcium: 10 mg/dL (ref 8.9–10.3)
Chloride: 101 mmol/L (ref 98–111)
Creatinine, Ser: 0.74 mg/dL (ref 0.61–1.24)
GFR calc Af Amer: >60 mL/min (ref >60)
GFR calc non Af Amer: >60 mL/min (ref >60)
Glucose, Bld: 105 mg/dL — ABNORMAL HIGH (ref 70–99)
Potassium: 4.2 mmol/L (ref 3.5–5.1)
Sodium: 141 mmol/L (ref 135–145)
Total Bilirubin: 0.9 mg/dL (ref 0.3–1.2)
Total Protein: 7.8 g/dL (ref 6.5–8.1)

## 2019-09-21 MED ORDER — OXYCODONE HCL 5 MG PO TABS
5.0000 mg | ORAL_TABLET | ORAL | Status: DC | PRN
  Administered 2019-09-21 – 2019-09-22 (×2): 5 mg via ORAL
  Administered 2019-09-22 – 2019-09-28 (×19): 10 mg via ORAL
  Administered 2019-09-28: 5 mg via ORAL
  Administered 2019-09-28 – 2019-10-03 (×16): 10 mg via ORAL
  Filled 2019-09-21 (×2): qty 2
  Filled 2019-09-21: qty 1
  Filled 2019-09-21 (×18): qty 2
  Filled 2019-09-21: qty 1
  Filled 2019-09-21 (×9): qty 2
  Filled 2019-09-21: qty 1
  Filled 2019-09-21 (×6): qty 2

## 2019-09-21 NOTE — Evaluation (Signed)
Speech Language Pathology Assessment and Plan  Patient Details  Name: Jason Skinner MRN: [id number] Date of Birth: 01-18-1991  SLP Diagnosis: Cognitive Impairments;Dysphagia  Rehab Potential: Excellent ELOS: 10-14 days    Today's Date: 09/21/2019 SLP Individual Time:  -      Hospital Problem: Principal Problem:   Traumatic subdural hematoma ([place])  Past Medical History: History reviewed. No pertinent past medical history. Past Surgical History:  Past Surgical History:  Procedure Laterality Date  . CRANIOTOMY Right 09/05/2019   Procedure: CRANIOTOMY HEMATOMA EVACUATION SUBDURAL WITH  PLACEMENT OF BONE FLAP IN ABDOMEN.;  Surgeon: Newman Pies, MD;  Location: [place];  Service: Neurosurgery;  Laterality: Right;  right    Assessment / Plan / Recommendation Clinical Impression   WUJ:WJXBJ [name] is a 29 year old right-handed limited English speaking male on no prescription medications. Per report lives with friends. He does not drive. Mobile home with 4 steps to entry. 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 He has been in the [place] for approximately 3 years and has family including a spouse in [place]. He does have a brother and uncle in the local area. 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 Noted positive loss of consciousness. Cranial CT scan showed right more than left cerebral convexity subdural hematoma with leftward midline shift of 2 cm and a right uncal herniation. Left temporal pole contusion with overlying subarachnoid hemorrhage. Occipital and clival midline fractures extending into the right sphenoid sinus with pneumocephalus. CT of the chest abdomen pelvis showed an L1-L2 right transverse process fracture as well as incidental findings of a 2 cm nodular projection from the right thyroid lobe. Admission chemistries potassium 3.1 alcohol negative lactic acid 3.1, SARS  coronavirus negative. Patient underwent right frontal temporal parietal craniectomy for evacuation of acute subdural hematoma and insertion of right frontal ventriculostomy insertion of craniotomy flap into the abdominal subcutaneous tissue 09/05/2019 per Dr. Arnoldo Morale. Latest follow-up cranial CT scan showed decreased mass-effect of 5 mm leftward midline shift as previously 20 mm. Conservative care of L1/2 right transverse process fractures. Patient remained intubated through 09/10/2019. Bilateral lower extremity Dopplers negative for DVT and upper extremities negative for DVT there was an anterior determinant superficial vein thrombosis involving the left cephalic vein. He was cleared to begin Lovenox for DVT prophylaxis 09/09/2019. His diet has been advanced to mechanical soft thin liquids. Bouts of urinary retention placed on Urecholine. Mild leukocytosis 14,300and monitored.Mild hyponatremia 131 improved to 136 sodium chloride tablets discontinued. Therapy evaluations completed patient is wearing a helmet when out of bed . Patient was admitted for a comprehensive rehab program 09/20/19 and SLP evaluations were completed 09/21/19 with results as follows:  Bedside swallow evaluation: Pt presents with very mild oral phase dysphagia, marked by oral residue post-swallow with mechanical soft textures. He was mostly independent with use of liquid washes to clear residue during intake, however at end of meal he required verbal cues for awareness of remaining residue and initiation of oral clearance strategies. Pt also reports head pain when masticating harder solids. No overt s/sx aspiration noted across thin liquids or soft solids. Recommend continue current dysphagia 3 (mechanical soft) texture diet, thin liquids, medications may be administered whole in purees, and full supervision to ensure safety during intake.  Cognitive-linguistic evaluation: Pt presents with mild cognitive impairments and behaviors  consistent with Rancho VI (confused/appropriate) but certainly emerging Rancho VII (automatic/appropriate). Primary deficits marked by severely reduced intellectual awareness of any physical  or cognitive impairments, mildly impaired short term and working memory, selective attention, and problem solving. He was oriented to self, place, and general situation (accident). Cueing required to recall details of accident and orientation to month (initially stated it was June). He verbally recalled hospital safety precautions and some new functional information related to his medical care with only question cues, but his uncle reports difficulty with recall of some daily events and carryover of new skills. He demonstrated appropriate use of call bell to request assistance. 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 Pt's speech was fully intelligible and fluent and expressive/recpetive language skills WFL.   Recommend pt receive skilled ST services to address above listed deficits in order to ensure diet safety and efficiency as well as maximize his functional independence and safety prior to discharge.    Skilled Therapeutic Interventions          Clinical bedside swallow and cognitive-linguistic evaluations were administered and results were reviewed with pt and his uncle - please see above for details regarding results.    SLP Assessment  Patient will need skilled Speech Lanaguage Pathology Services during CIR admission    Recommendations  SLP Diet Recommendations: Dysphagia 3 (Mech soft);Thin Liquid Administration via: Cup;Straw Medication Administration: Whole meds with puree Supervision: Staff to assist with self feeding;Full supervision/cueing for compensatory strategies Compensations: Slow rate;Small sips/bites;Other (Comment) (check for oral residue at end of meal) Postural Changes and/or Swallow Maneuvers: Seated upright 90 degrees Oral Care  Recommendations: Oral care BID Patient destination: Home Follow up Recommendations: 24 hour supervision/assistance (follow up ST TBD depending on progress with in CIR) Equipment Recommended: None recommended by SLP    SLP Frequency 3 to 5 out of 7 days   SLP Duration  SLP Intensity  SLP Treatment/Interventions 10-14 days  Minumum of 1-2 x/day, 30 to 90 minutes  Cognitive remediation/compensation;Cueing hierarchy;Dysphagia/aspiration precaution training;Functional tasks;Patient/family education;Internal/external aids    Pain Pain Assessment Pain Scale: Faces Pain Score: 1  Faces Pain Scale: No hurt Pain Type: Acute pain Pain Location: Abdomen Pain Descriptors / Indicators: Aching Pain Frequency: Intermittent Pain Onset: On-going Pain Intervention(s): Medication (See eMAR)  Prior Functioning Type of Home: Mobile home  Lives With: Friend(s) Available Help at Discharge: Friend(s) Vocation: Full time employment  SLP Evaluation Cognition Overall Cognitive Status: Impaired/Different from baseline Arousal/Alertness: Awake/alert Orientation Level: Oriented to person;Oriented to situation;Oriented to place (oriented to having an "accident" but not details of accident, oriented to summer but not month) Attention: Sustained;Selective Focused Attention: Appears intact Sustained Attention: Appears intact Selective Attention: Impaired Selective Attention Impairment: Functional basic Memory: Impaired Memory Impairment: Decreased short term memory;Other (comment) (decreased working memory) Decreased Short Term Memory: Verbal basic;Functional basic Awareness: Impaired Awareness Impairment: Intellectual impairment Problem Solving: Impaired Problem Solving Impairment: Verbal basic;Functional basic Executive Function: Organizing;Self Monitoring Organizing: Impaired Organizing Impairment: Functional basic Self Monitoring: Impaired Safety/Judgment: Impaired Comments: safety impaired  due to decreased intellectual awareness of physical and cognitive deficits Rancho [employer] Scales of Cognitive Functioning: Confused/appropriate (emerging VII)  Comprehension Auditory Comprehension Overall Auditory Comprehension: Appears within functional limits for tasks assessed Yes/No Questions: Within Functional Limits Commands: Within Functional Limits Conversation: Complex Visual Recognition/Discrimination Discrimination: Not tested Reading Comprehension Reading Status: Not tested Expression Expression Primary Mode of Expression: Verbal Verbal Expression Overall Verbal Expression: Appears within functional limits for tasks assessed Written Expression Written Expression: Within Functional Limits Oral Motor Oral Motor/Sensory Function Overall Oral Motor/Sensory Function: Within functional limits Motor Speech Overall Motor Speech:  Appears within functional limits for tasks assessed Phonation: Normal Intelligibility: Intelligible Motor Speech Errors: Not applicable   Intelligibility: Intelligible  Bedside Swallowing Assessment General Date of Onset: 09/05/19 Diet Prior to this Study: Dysphagia 3 (soft);Thin liquids Temperature Spikes Noted: N/A Respiratory Status: Room air History of Recent Intubation: Yes Length of Intubations (days): 6 days Date extubated: 09/10/19 Behavior/Cognition: Alert;Requires cueing;Distractible Oral Cavity - Dentition: Adequate natural dentition Vision: Impaired for self-feeding Patient Positioning: Upright in bed Baseline Vocal Quality: Normal Volitional Cough: Strong Volitional Swallow: Able to elicit  Oral Care Assessment Does patient have any of the following "high(er) risk" factors?: None of the above Does patient have any of the following "at risk" factors?: Other - dysphagia Patient is LOW RISK: Follow universal precautions (see row information) Ice Chips Ice chips: Not tested Thin Liquid Thin Liquid: Within functional  limits Presentation: Cup;Straw;Self Fed Nectar Thick Nectar Thick Liquid: Not tested Honey Thick Honey Thick Liquid: Not tested Puree Puree: Not tested Solid Solid: Impaired Oral Phase Functional Implications: Oral residue BSE Assessment Risk for Aspiration Impact on safety and function: Mild aspiration risk Other Related Risk Factors: Lethargy;Cognitive impairment;Prolonged intubation;Deconditioning  Short Term Goals: Week 1: SLP Short Term Goal 1 (Week 1): Pt will consume dysphagia 3 solid texture diet and thin liquids with minimal overt s/sx aspiration and Supervision A verbal cues for use of strategies to clear oral residue. SLP Short Term Goal 2 (Week 1): Pt will demonstrate ability to use compensatory memory strategies to recall new and/or daily information with Min A verbal/visual cues. SLP Short Term Goal 3 (Week 1): Pt will verbally identify 1 physical and 1 cognitive impairment with Mod A verbal/visual cues. SLP Short Term Goal 4 (Week 1): Pt will demonstrate ability to problem solve functional situations with Min A verbal/visual cues. SLP Short Term Goal 5 (Week 1): Pt will selectively attend to functional tasks with Min A verbal/visual cues.  Refer to Care Plan for Long Term Goals  Recommendations for other services: None   Discharge Criteria: Patient will be discharged from SLP if patient refuses treatment 3 consecutive times without medical reason, if treatment goals not met, if there is a change in medical status, if patient makes no progress towards goals or if patient is discharged from hospital.  The above assessment, treatment plan, treatment alternatives and goals were discussed and mutually agreed upon: by patient and family  Arbutus Leas 09/21/2019, 12:46 PM

## 2019-09-21 NOTE — Evaluation (Signed)
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 No pertinent past medical history. Past Surgical History:  Past Surgical History:  Procedure Laterality Date   CRANIOTOMY Right 09/05/2019   Procedure: CRANIOTOMY HEMATOMA EVACUATION SUBDURAL WITH  PLACEMENT OF BONE FLAP IN ABDOMEN.;  Surgeon: Newman Pies, MD;  Location: [facility];  Service: Neurosurgery;  Laterality: Right;  right    Assessment & Plan Clinical Impression: 29yo male who was brought into [facility] earlier today as a level 2 trauma after falling about 20 feet from a ladder on construction site. Pt is found to have B SDH R>L with midline shift and uncal herniation, L temporal contusion and SAH, Occipital and clivial midline fxs extending into R sphenoid with hemosinus and pneumocephalus, respiratory failure, atelectasis, L1-2 TP fxs. Pt was intubated on 6/30. Pt underwent R frontotemporoparietal craniectomy for evacuation of acute subdural hematoma; insertion of right frontal ventriculostomy; insertion of craniotomy flap into the abdominal subcutaneous tissue. Pt extubated on 7/5.  Patient currently requires mod with basic self-care skills secondary to muscle weakness, decreased cardiorespiratoy endurance, impaired timing and sequencing, unbalanced muscle activation and decreased coordination, decreased visual perceptual skills and decreased visual motor skills, decreased motor planning, decreased attention, decreased awareness,  decreased problem solving, decreased safety awareness and decreased memory and decreased sitting balance, decreased standing balance, decreased postural control and decreased balance strategies.  Prior to hospitalization, patient could complete BADL/IADL with independent .  Patient will benefit from skilled intervention to decrease level of assist with basic self-care skills and increase independence with basic self-care skills prior to discharge home with care partner.  Anticipate patient will require 24 hour supervision and follow up home health.  OT - End of Session Endurance Deficit: Yes OT Assessment Rehab Potential (ACUTE ONLY): Good OT Barriers to Discharge: Decreased caregiver support;Lack of/limited family support OT Barriers to Discharge Comments: unsure if family will be able to provide 24/7 supervision OT Patient demonstrates impairments in the following area(s): Balance;Behavior;Cognition;Endurance;Motor;Pain;Perception;Safety;Vision OT Basic ADL's Functional Problem(s): Grooming;Bathing;Dressing;Toileting;Eating OT Advanced ADL's Functional Problem(s): Simple Meal Preparation OT Transfers Functional Problem(s): Toilet;Tub/Shower OT Additional Impairment(s): Fuctional Use of Upper Extremity OT Plan OT Intensity: Minimum of 1-2 x/day, 45 to 90 minutes OT Frequency: 5 out of 7 days OT Duration/Estimated Length of Stay: 10-14 OT Treatment/Interventions: Balance/vestibular training;Discharge planning;Pain management;Self Care/advanced ADL retraining;Therapeutic Activities;UE/LE Coordination activities;Visual/perceptual remediation/compensation;Therapeutic Exercise;Skin care/wound managment;Patient/family education;Functional mobility training;Disease mangement/prevention;Cognitive remediation/compensation;Community reintegration;DME/adaptive equipment instruction;Neuromuscular re-education;Psychosocial support;Splinting/orthotics;UE/LE Strength taining/ROM;Wheelchair  propulsion/positioning OT Self Feeding Anticipated Outcome(s): S OT Basic Self-Care Anticipated Outcome(s): S OT Toileting Anticipated Outcome(s): S OT Bathroom Transfers Anticipated Outcome(s): S OT Recommendation Patient destination: Home Follow Up Recommendations: Home health OT Equipment Recommended: 3 in 1 bedside comode;Tub/shower seat;To be determined Equipment Details: TBD based on d/c plan per uncle   Skilled Therapeutic Intervention 1:1. Pt received in bed agreeable to OT reporting 5/10 pain initially and increasing to 8/10 pain in head after ADls and UE assessment. RN delivers pain medication at end of session and uncle present at end of session. Interpreter utilized [employer] for communication. Pt  completes all transfers at [facility] level and BADLs completed in w/c at sink as recorded below. Pt oriented to self and time (except day of the week), but not place or situation. Pt gently reoriented. Pt impulsively attempting to take off C collar with continued education to leave it on d/t injuries. Pt completes 9HPT, dynamometer and dynavision for vision reporting diplopia throughout when R eye open as well as overshooting (especially looking to L field of vision) d/t R eye difficulty tracking medially. Exited session with pt seated in bed, exit alarm on and call light in reach,  OT Evaluation Precautions/Restrictions  Precautions Precautions: Fall Precaution Comments: bone flap in abdomen, helmet when OOB Required Braces or Orthoses: Cervical Brace;Other Brace Cervical Brace: Hard collar;At all times Other Brace: helmet for OOB  Restrictions Weight Bearing Restrictions: No General Chart Reviewed: Yes Family/Caregiver Present: Yes Vital Signs   Pain Pain Assessment Pain Scale: 0-10 Pain Score: 5  Pain Type: Acute pain Pain Location: Head Pain Descriptors / Indicators: Aching Pain Frequency: Constant Pain Onset: On-going Patients Stated Pain Goal: 2 Pain Intervention(s):  Medication (See eMAR) Home Living/Prior Functioning Home Living Family/patient expects to be discharged to:: Private residence Living Arrangements: Alone Available Help at Discharge: Friend(s) Type of Home: Mobile home Home Access: Stairs to enter [employer] of Steps: 4-5 Entrance Stairs-Rails: Left, Right Home Layout: One level Bathroom Shower/Tub: Chiropodist: Standard Bathroom Accessibility: Yes Additional Comments: 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 Brother is here.   Lives With: Friend(s) Prior Function Level of Independence: Independent with basic ADLs  Able to Take Stairs?: Yes Vocation: Full time employment Comments: wife and 2 daughters in [place] ADL ADL Upper Body Bathing: Minimal cueing, Supervision/safety Where Assessed-Upper Body Bathing: Sitting at sink Lower Body Bathing: Maximal assistance Where Assessed-Lower Body Bathing: Sitting at sink, Standing at sink Upper Body Dressing: Minimal assistance Where Assessed-Upper Body Dressing: Sitting at sink Lower Body Dressing: Maximal assistance Where Assessed-Lower Body Dressing: Sitting at sink, Standing at sink Toileting: Minimal assistance Where Assessed-Toileting: Glass blower/designer: Psychiatric nurse Method: Stand pivot Vision Baseline Vision/History: No visual deficits Patient Visual Report: Diplopia Vision Assessment?: Yes Eye Alignment: Impaired (comment) Ocular Range of Motion: Impaired-to be further tested in functional context Alignment/Gaze Preference:  (L exotropia) Tracking/Visual Pursuits: Left eye does not track medially Convergence: Impaired (comment) Depth Perception: Overshoots Perception  Perception: Impaired Praxis Praxis: Intact Cognition Overall  Cognitive Status: Impaired/Different from baseline Arousal/Alertness: Awake/alert Orientation Level: Person;Place;Situation Person: Oriented Place: Disoriented Situation: Disoriented Year: 2021 Month: July Day of Week: Incorrect Memory: Impaired Memory Impairment: Decreased short term memory;Other (comment) Decreased Short Term Memory: Verbal basic;Functional basic Immediate Memory Recall: Sock;Blue;Bed Memory Recall Sock: With Cue Memory Recall Blue: Without Cue Memory Recall Bed: Without Cue Attention: Sustained;Selective Focused Attention: Appears intact Sustained Attention: Appears intact Selective Attention: Impaired Selective Attention Impairment: Functional basic Awareness: Impaired Awareness Impairment: Intellectual impairment Problem Solving: Impaired Problem Solving Impairment: Verbal basic;Functional basic Executive Function: Organizing;Self Monitoring Reasoning: Impaired Reasoning Impairment: Verbal basic Organizing: Impaired Organizing Impairment: Functional basic Self Monitoring: Impaired Safety/Judgment: Impaired Comments: safety impaired due to decreased intellectual awareness of physical and cognitive deficits [employer] Scales of Cognitive Functioning: Confused/appropriate Sensation Sensation Light Touch: Appears Intact Proprioception: Impaired by gross assessment Coordination  Gross Motor Movements are Fluid and Coordinated: No Fine Motor Movements are Fluid and Coordinated: No Coordination and Movement Description: mild dysmetria in BLE R>L Finger Nose Finger Test: impaired BUE, R>L Heel Shin Test: mild dysmetria in BLE, R>L Motor  Motor Motor: Hemiplegia;Abnormal postural alignment and control;Ataxia Motor - Skilled Clinical Observations: mild R hemi, R>L coordination deficits, Mobility  Bed Mobility Bed Mobility: Rolling Right;Rolling Left;Supine to Sit;Sit to Supine Rolling Right: Minimal Assistance - Patient > 75% Rolling Left: Minimal  Assistance - Patient > 75% Supine to Sit: Minimal Assistance - Patient > 75% Sit to Supine: Minimal Assistance - Patient > 75% Transfers Sit to Stand: Minimal Assistance - Patient > 75%  Trunk/Postural Assessment  Cervical Assessment Cervical Assessment:  ([place] C collar) Thoracic Assessment Thoracic Assessment:  (rounded shoulders) Lumbar Assessment Lumbar Assessment: Within Functional Limits Postural Control Postural Control: Deficits on evaluation Righting Reactions: delayed in standing Protective Responses: delayed Postural Limitations: poor awareness of lateral LOB  Balance Balance Balance Assessed: Yes Static Sitting Balance Static Sitting - Balance Support: No upper extremity supported Static Sitting - Level of Assistance: 5: Stand by assistance Dynamic Sitting Balance Dynamic Sitting - Balance Support: Right upper extremity supported Dynamic Sitting - Level of Assistance: 5: Stand by assistance Sitting balance - Comments: donned socks sitting EOB with min A Static Standing Balance Static Standing - Balance Support: No upper extremity supported;During functional activity Static Standing - Level of Assistance: 4: Min assist Dynamic Standing Balance Dynamic Standing - Balance Support: Right upper extremity supported;During functional activity Dynamic Standing - Level of Assistance: 4: Min assist;3: Mod assist Extremity/Trunk Assessment RUE Assessment RUE Assessment: Exceptions to [facility] Active Range of Motion (AROM) Comments: shoudler- 0-40*; full elbow/wrist, decreased oposition; edema in digits General Strength Comments: R grip strength average over 3 trials 47#; 9HPT 35m8 seconds LUE Assessment LUE Assessment: Exceptions to [facility]AROM[facility] Comments: WFL General Strength Comments: grip strength 73# and 9HPT 1 min 2 seconds     Refer to Care Plan for Long Term Goals  Recommendations for other services: None    Discharge Criteria: Patient will be  discharged from OT if patient refuses treatment 3 consecutive times without medical reason, if treatment goals not met, if there is a change in medical status, if patient makes no progress towards goals or if patient is discharged from hospital.  The above assessment, treatment plan, treatment alternatives and goals were discussed and mutually agreed upon: by patient and by family  STonny Branch7/16/2021, 4:58 PM

## 2019-09-21 NOTE — Progress Notes (Addendum)
Patient's family member reported that patient has not had bowel movement in 4-5 days. On-call provider notified. New orders for Sorbitol PRN and scheduled Miralax daily. PRN sorbitol given.

## 2019-09-21 NOTE — Evaluation (Signed)
Physical Therapy Assessment and Plan  Patient Details  Name: Jason Skinner MRN: [id number] Date of Birth: 20-Jun-1990  PT Diagnosis: Abnormal posture, Abnormality of gait, Ataxia, Ataxic gait, Coordination disorder, Dizziness and giddiness, Hemiplegia dominant, Muscle weakness and Pain in neck Rehab Potential: Good ELOS: 10-14 days   Today's Date: 09/21/2019 PT Individual Time: 1105-1205 PT Individual Time Calculation (min): 60 min    Hospital Problem: Principal Problem:   Traumatic subdural hematoma ([place])   Past Medical History: History reviewed. No pertinent past medical history. Past Surgical History:  Past Surgical History:  Procedure Laterality Date  . CRANIOTOMY Right 09/05/2019   Procedure: CRANIOTOMY HEMATOMA EVACUATION SUBDURAL WITH  PLACEMENT OF BONE FLAP IN ABDOMEN.;  Surgeon: Newman Pies, MD;  Location: [facility];  Service: Neurosurgery;  Laterality: Right;  right    Assessment & Plan Clinical Impression: Patient is a 29 year old right-handed limited English speaking male on no prescription medications. Per report lives with friends. He does not drive. Mobile home with 4 steps to entry. 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 He has been in the [place] for approximately 3 years and has family including a spouse in [place]. He does have a brother and uncle in the local area. 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 Noted positive loss of consciousness. Cranial CT scan showed right more than left cerebral convexity subdural hematoma with leftward midline shift of 2 cm and a right uncal herniation. Left temporal pole contusion with overlying subarachnoid hemorrhage. Occipital and clival midline fractures extending into the right sphenoid sinus with pneumocephalus. CT of the chest abdomen pelvis showed an L1-L2 right transverse process fracture as well as incidental findings of a 2  cm nodular projection from the right thyroid lobe. Admission chemistries potassium 3.1 alcohol negative lactic acid 3.1, SARS coronavirus negative. Patient underwent right frontal temporal parietal craniectomy for evacuation of acute subdural hematoma and insertion of right frontal ventriculostomy insertion of craniotomy flap into the abdominal subcutaneous tissue 09/05/2019 per Dr. Arnoldo Morale. Latest follow-up cranial CT scan showed decreased mass-effect of 5 mm leftward midline shift as previously 20 mm. Conservative care of L1/2 right transverse process fractures. Patient remained intubated through 09/10/2019. Bilateral lower extremity Dopplers negative for DVT and upper extremities negative for DVT there was an anterior determinant superficial vein thrombosis involving the left cephalic vein. He was cleared to begin Lovenox for DVT prophylaxis 09/09/2019.  Patient transferred to CIR on 09/20/2019 .   Patient currently requires min with mobility secondary to muscle weakness and muscle joint tightness, decreased cardiorespiratoy endurance, impaired timing and sequencing and decreased coordination, decreased visual acuity, decreased visual perceptual skills, decreased visual motor skills and field cut, decreased attention to right, decreased attention, decreased awareness, decreased problem solving, decreased safety awareness and delayed processing and decreased sitting balance, decreased standing balance, decreased postural control, hemiplegia, decreased balance strategies and difficulty maintaining precautions.  Prior to hospitalization, patient was independent  with mobility and lived with Friend(s) in a Mobile home home.  Home access is 4-5Stairs to enter.  Patient will benefit from skilled PT intervention to maximize safe functional mobility, minimize fall risk and decrease caregiver burden for planned discharge home with 24 hour supervision.  Anticipate patient will benefit from follow up [place] at  discharge.  PT - End of Session Activity Tolerance: Tolerates 10 - 20 min activity with multiple rests Endurance Deficit: Yes PT Assessment Rehab Potential (ACUTE/IP ONLY): Good PT Barriers to Discharge: Inaccessible  home environment;Decreased caregiver support;Home environment access/layout;Lack of/limited family support;Insurance for SNF coverage;Weight bearing restrictions;Behavior PT Patient demonstrates impairments in the following area(s): Balance;Behavior;Edema;Endurance;Motor;Nutrition;Pain;Perception;Safety;Skin Integrity;Sensory PT Transfers Functional Problem(s): Bed Mobility;Bed to Chair;Car;Furniture;Floor PT Locomotion Functional Problem(s): Ambulation;Wheelchair Mobility;Stairs PT Plan PT Intensity: Minimum of 1-2 x/day ,45 to 90 minutes PT Frequency: 5 out of 7 days PT Duration Estimated Length of Stay: 10-14 days PT Treatment/Interventions: Ambulation/gait training;Community reintegration;DME/adaptive equipment instruction;Neuromuscular re-education;Stair training;Psychosocial support;UE/LE Strength taining/ROM;Wheelchair propulsion/positioning;UE/LE Coordination activities;Therapeutic Activities;Skin care/wound management;Pain management;Functional electrical stimulation;Discharge planning;Balance/vestibular training;Cognitive remediation/compensation;Disease management/prevention;Functional mobility training;Patient/family education;Splinting/orthotics PT Transfers Anticipated Outcome(s): Supervision assist with LRAD PT Locomotion Anticipated Outcome(s): AMbulatory with LRAD at supervision assist level PT Recommendation Recommendations for Other Services: Neuropsych consult;Therapeutic Recreation consult Therapeutic Recreation Interventions: Clinical cytogeneticist;Outing/community reintergration Follow Up Recommendations: Home health PT Patient destination: Home Equipment Recommended: Rolling walker with 5" wheels;To be determined  Skilled Therapeutic  Intervention Pt received supine in bed and agreeable to PT. Supine>sit transfer with min assist as listed below. PT instructed patient in PT Evaluation and initiated treatment intervention; see below for results. PT educated patient in [place], rehab potential, rehab goals, and discharge recommendations.  Gait training with HHA 2 x 79f with min assist overall and mod assist x1 in each bout of gait training with lateral LOB to the R due to scissoring gait and narrow BOS. Pt unable to perform stairs without UE support on this day due to coordination deficits. Car transfer with mod assist and moderate cues for safety.  Throughout session pt demonstrated visual deficits in superior field as well as R inattention, but unable to verbalize in testing. Noted to have mild peripheral visual deficits on the L . Stand pivot transfer to return to be with min assist and mild improved step width, but demonstrated some impulsity to return to supine. Pt maintained C-collar and helmet throughout session.      PT Evaluation Precautions/Restrictions Precautions Precautions: Fall Precaution Comments: bone flap in abdomen, helmet when OOB Required Braces or Orthoses: Cervical Brace;Other Brace Cervical Brace: Hard collar;At all times Restrictions Weight Bearing Restrictions: No General   Vital Signs  sitting 113/84, HR 90.  Standing 116/82, HR 111. Mild dizziness in standing reported by pt.  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 He clean clothes at laundry mat, goes shopping to stores, [place] to store because he does not have a car, [place] Brother [place].   Lives With: Friend(s) Prior Function Level of Independence: Independent with basic ADLs  Able to Take Stairs?: Yes Vocation: Full time employment Comments[place]  Vision - Assessment Ocular Range of Motion: Impaired-to be further tested in functional context Additional Comments: ptosis in the R eye, reports diplopia in gaze with R eye closed Perception Perception: Impaired Praxis Praxis: Intact  Cognition Overall Cognitive Status: Impaired/Different from baseline Arousal/Alertness: Awake/alert Attention: Focused;Sustained Focused Attention: Appears intact Memory: Impaired Problem Solving: Impaired Safety/Judgment: Impaired [place]  Amigos Scales of Cognitive Functioning: Automatic/appropriate Sensation Sensation Light Touch: Appears Intact Proprioception: Impaired by gross assessment Coordination Gross Motor Movements are Fluid and Coordinated: No Fine Motor Movements are Fluid and Coordinated: No Coordination and Movement Description: mild dysmetria in BLE R>L Finger Nose Finger Test: impaired BUE, R>L Heel Shin Test: mild dysmetria in BLE, R>L Motor  Motor Motor: Hemiplegia;Abnormal postural alignment and control;Ataxia Motor - Skilled Clinical Observations: mild R hemi, R>L coordination deficits,  Mobility Bed Mobility Bed Mobility: Rolling Right;Rolling Left;Supine to Sit;Sit to Supine Rolling Right: Minimal Assistance - Patient > 75% Rolling Left: Minimal Assistance - Patient > 75% Supine to Sit: Minimal Assistance - Patient > 75% Sit to Supine: Minimal Assistance - Patient > 75% Transfers Transfers: Sit to Stand;Stand  Pivot Transfers Sit to Stand: Minimal Assistance - Patient > 75% Stand Pivot Transfers: Minimal Assistance - Patient > 75% Transfer (Assistive device): None Locomotion  Gait Ambulation: Yes Gait Assistance: Minimal Assistance - Patient > 75%;Moderate Assistance - Patient 50-74% Gait Distance (Feet): 50 Feet Assistive device: None Gait Gait: Yes Gait Pattern: Impaired Gait Pattern: Ataxic;Narrow base of support Gait velocity: decreased Stairs / Additional Locomotion Stairs: No Wheelchair Mobility Wheelchair Mobility: Yes Wheelchair Assistance: Moderate Assistance - Patient 50 - 74% Wheelchair Propulsion: Both upper extremities Wheelchair Parts Management: Needs assistance Distance: 150  Trunk/Postural Assessment  Cervical Assessment Cervical Assessment: Exceptions to [facility] ([place] C collar) Thoracic Assessment Thoracic Assessment: Exceptions to [facility] (rounded shoulders) Lumbar Assessment Lumbar Assessment: Within Functional Limits Postural Control Postural Control: Deficits on evaluation Righting Reactions: delayed in standing Protective Responses: delayed Postural Limitations: poor awareness of lateral LOB  Balance Balance Balance Assessed: Yes Static Sitting Balance Static Sitting - Balance Support: No upper extremity supported Static Sitting - Level of Assistance: 5: Stand by assistance Dynamic Sitting Balance Dynamic Sitting - Balance Support: Right upper extremity supported Dynamic Sitting - Level of Assistance: 5: Stand by assistance Static Standing Balance Static Standing - Balance Support: No upper extremity supported;During functional activity Static Standing - Level of Assistance: 4: Min assist Dynamic Standing Balance Dynamic Standing - Balance Support: Right upper extremity supported;During functional activity Dynamic Standing - Level of Assistance: 4: Min assist;3: Mod assist Extremity Assessment      RLE Assessment RLE Assessment: Exceptions to  [place] General Strength Comments: grossly 4/5 proximal to distal LLE Assessment LLE Assessment: Exceptions to [facility] General Strength Comments: 4+/5 proximal to distal    Refer to Care Plan for Long Term Goals  Recommendations for other services: Neuropsych and Surveyor, mining group, Stress management and Outing/community reintegration  Discharge Criteria: Patient will be discharged from PT if patient refuses treatment 3 consecutive times without medical reason, if treatment goals not met, if there is a change in medical status, if patient makes no progress towards goals or if patient is discharged from hospital.  The above assessment, treatment plan, treatment alternatives and goals were discussed and mutually agreed upon: by patient  [name] 09/21/2019, 12:17 PM

## 2019-09-21 NOTE — Progress Notes (Signed)
PHYSICAL MEDICINE & REHABILITATION PROGRESS NOTE   Subjective/Complaints: Brother with him. No problems over night. appears to have slept  ROS: Limited due to cognitive/behavioral    Objective:   No results found. Recent Labs    09/19/19 0331 09/21/19 0643  WBC 14.3* 11.9*  HGB 12.9* 13.1  HCT 39.4 39.5  PLT 758* 729*   Recent Labs    09/20/19 1720 09/21/19 0643  NA  --  141  K  --  4.2  CL  --  101  CO2  --  29  GLUCOSE  --  105*  BUN  --  16  CREATININE 0.65 0.74  CALCIUM  --  10.0    Intake/Output Summary (Last 24 hours) at 09/21/2019 1117 Last data filed at 09/21/2019 0751 Gross per 24 hour  Intake 674 ml  Output 750 ml  Net -76 ml     Physical Exam: Vital Signs Blood pressure 115/85, pulse 79, temperature 98.2 F (36.8 C), temperature source Oral, resp. rate 16, height 5\' 8"  (1.727 m), weight 65.4 kg, SpO2 100 %.  General: Alert and oriented x 3, No apparent distress HEENT: Head is normocephalic, atraumatic, PERRLA, EOMI, sclera anicteric, oral mucosa pink and moist, dentition intact, ext ear canals clear,  Neck: Supple without JVD or lymphadenopathy Heart: Reg rate and rhythm. No murmurs rubs or gallops Chest: CTA bilaterally without wheezes, rales, or rhonchi; no distress Abdomen: Soft, non-tender, non-distended, bowel sounds positive. Extremities: No clubbing, cyanosis, or edema. Pulses are 2+ Skin: crani and abd sites CDI Neuro: alert. Right CN III weakness. Follows basic commands. Limited insight and awareness. Oriented to self and hospital. Moves all 4's. No resting tone. senses pain in all 4's. Musculoskeletal: Full ROM, No pain with AROM or PROM in the neck, trunk, or extremities. Posture appropriate Psych: flat     Assessment/Plan: 1. 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  Physiatrist is providing close team  supervision and 24 hour management of active medical problems listed below.  Physiatrist and rehab team continue to assess barriers to discharge/monitor patient progress toward functional and medical goals  Care Tool:  Bathing              Bathing assist       Upper Body Dressing/Undressing Upper body dressing Upper body dressing/undressing activity did not occur (including orthotics): N/A What is the patient wearing?: Hospital gown only    Upper body assist      Lower Body Dressing/Undressing Lower body dressing    Lower body dressing activity did not occur: N/A       Lower body assist       Toileting Toileting    Toileting assist Assist for toileting: Moderate Assistance - Patient 50 - 74% (bedpan)     Transfers Chair/bed transfer  Transfers assist  Chair/bed transfer activity did not occur: N/A        Locomotion Ambulation   Ambulation assist              Walk 10 feet activity   Assist           Walk 50 feet activity   Assist           Walk 150 feet activity   Assist           Walk 10 feet on uneven surface  activity   Assist  Wheelchair     Assist               Wheelchair 50 feet with 2 turns activity    Assist            Wheelchair 150 feet activity     Assist          Blood pressure 115/85, pulse 79, temperature 98.2 F (36.8 C), temperature source Oral, resp. rate 16, height 5\' 8"  (1.727 m), weight 65.4 kg, SpO2 100 %.  Medical Problem List and Plan: 1.Decreased functional mobility with altered mental statussecondary to traumatic bilateral SDH/SAH. Status post right frontotemporoparietal craniectomy evacuation of acute subdural hematoma and insertion of right frontal ventriculostomy insertion of craniotomy flap into the abdominal subcutaneous tissue 09/05/2019. Helmet when out of bed. -patient maynot yetshower -ELOS/Goals: 2-3 weeks,  supervision goals  --Patient is beginning CIR therapies today including PT, OT, and SLP   -soft helmet 2. Antithrombotics: -DVT/anticoagulation:Lovenox 09/09/2019. Venous Doppler studies upper lower extremities negative except superficial cephalic vein thrombosis -antiplatelet therapy: N/A 3. Pain Management:Lidoderm patch,Robaxin 500 mg every 8 hours and oxycodone as needed 4. Mood:Provide emotional support -antipsychotic agents: N/A - cont sleep wake chart 5. Neuropsych: This patientis notcapable of making decisions on hisown behalf. 6. Skin/Wound Care:Routine skin checks.  -local care to abdomen and scalp 7. Fluids/Electrolytes/Nutrition:encourage PO  -I personally reviewed the patient's labs today.   8. L1/2 right transverse process fractures. Conservative care 9. Urinary retention.  Improving, pvr's 0-100  -Continue Urecholine.     -Recent urinalysis negative 10. Dysphagia. Dysphagia #3 thin liquids. Follow-up speech therapy 11. Leukocytosis: likely reactive, wbc's falling    LOS: 1 days A FACE TO [place] 09/21/2019, 11:17 AM

## 2019-09-21 NOTE — Progress Notes (Signed)
Inpatient Rehabilitation Care Coordinator Assessment and Plan  Patient Details  Name: Jason Skinner MRN: [id number] Date of Birth: Sep 07, 1990  Today's Date: 09/21/2019  Problem List:  Patient Active Problem List   Diagnosis Date Noted  . Traumatic subdural hematoma ([place]) 09/20/2019  . SDH (subdural hematoma) ([place]) 09/05/2019   Past Medical History: History reviewed. No pertinent past medical history. Past Surgical History:  Past Surgical History:  Procedure Laterality Date  . CRANIOTOMY Right 09/05/2019   Procedure: CRANIOTOMY HEMATOMA EVACUATION SUBDURAL WITH  PLACEMENT OF BONE FLAP IN ABDOMEN.;  Surgeon: Jason Pies, MD;  Location: [facility];  Service: Neurosurgery;  Laterality: Right;  right   Social History:  reports that he has never smoked. He has never used smokeless tobacco. He reports previous alcohol use. He reports previous drug use.  Family / Support Systems Marital Status: Married How Long?: 8 years Patient Roles: Spouse, Parent Spouse/Significant Other: Wife lives in [place] Children: 2 daughters; live in [place] with their mother Other Supports: Jason Skinner and brother Jason Skinner Anticipated Caregiver: See above Ability/Limitations of Caregiver: Both work fulltime but are willing to rotate their ability to provide care. Also working on other family that may be able to assist him. Caregiver Availability: 24/7 Family Dynamics: Pt lives alone in mobile home.  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 SW received updates from pt uncle Jason Skinner who was present Recent Psychosocial Issues: Denies Psychiatric History: Denies Substance Abuse History: Denies  Patient / Family Perceptions, Expectations & Goals Pt/Family understanding of illness & functional limitations: Pt and uncle have Jason general understanding of care needs Premorbid pt/family roles/activities: Independent Anticipated changes in roles/activities/participation: Assistance with ADLs/IADLs  Education officer, environmental Agencies: None Premorbid Home Care/DME Agencies: None Transportation available at discharge: [place] or brother  Discharge Planning Living Arrangements: [place]: Other relatives Type of Residence: Private residence Insurance Resources: Teacher, adult education Resources: Employment, Family Support Financial Screen Referred: Yes (Medicaid Pending) Living Expenses: Rent Money Management: Patient Does the patient have any problems obtaining your medications?: No Care Coordinator Barriers to Discharge: Decreased caregiver support, Lack of/limited family support Care Coordinator Anticipated Follow Up Needs: [place] Additional Notes/Comments: Limited natural supports. Family is working on additional supports that can assist with his care needs at discharge.  Clinical Impression SW met with pt in room. Pt Uncle Jason Skinner arrived during assessment. SW used [employer] IO#[id number]. Pt lives alone with Jason friend. His wife and children are in [place]. Pt is Medicaid pending. His Jason Skinner and brother plan to work on rotating their time to care for him. Pt has no HCPOA. No DME. SW discussed possible DME that pt will need at d/c and discussed charity care for DME. His Uncle Jason Skinner is aware we will continue to provide updates on his care needs after team  conference and potential DME needed.   Jason Skinner Jason Skinner 09/21/2019, 2:26 PM

## 2019-09-21 NOTE — Progress Notes (Signed)
Inpatient Rehabilitation  Patient information reviewed and entered into eRehab system by Kyleigha Markert M. Cyndee Giammarco, M.A., CCC/SLP, PPS Coordinator.  Information including medical coding, functional ability and quality indicators will be reviewed and updated through discharge.    

## 2019-09-21 NOTE — Progress Notes (Addendum)
Patient arrived to unit with [facility]; Received report that patient should wear at all times. Collar placed on patient, and explained importance. Upon rounds, patient removed collar. Patient educated again, but refused collar.

## 2019-09-22 ENCOUNTER — Inpatient Hospital Stay (HOSPITAL_COMMUNITY): Payer: Self-pay | Admitting: Physical Therapy

## 2019-09-22 ENCOUNTER — Inpatient Hospital Stay (HOSPITAL_COMMUNITY): Payer: Self-pay

## 2019-09-22 ENCOUNTER — Inpatient Hospital Stay (HOSPITAL_COMMUNITY): Payer: Self-pay | Admitting: Speech Pathology

## 2019-09-22 NOTE — Progress Notes (Signed)
Physical Therapy Session Note  Patient Details  Name: Jason Skinner MRN: [id number] Date of Birth: 08/14/1990  Today's Date: 09/22/2019 PT Individual Time: 1610-1650 PT Individual Time Calculation (min): 40 min   Short Term Goals: Week 1:  PT Short Term Goal 1 (Week 1): Pt will perform bed mobility with supervision assist PT Short Term Goal 2 (Week 1): Pt will transfer to and from Bed with CGA and LRAD PT Short Term Goal 3 (Week 1): Pt will ambulate 192f with CGA and LRAD PT Short Term Goal 4 (Week 1): Pt will demonstrate awareness of R side 50% of the time with min cues  Skilled Therapeutic Interventions/Progress Updates:    Pt received supine in bed, no cervical collar in place. PT applied Collar while pt in sidelying. . Supine>sit transfer with supervision assist, PT applied helmet seated EOB. [facility] [place],  Gait training in day room with HHA 2 x 628f Dynamic gait to weave through 10 cones x 2 with min-mod assist from PT. Pt noted to have difficulty navigating cones on the floor when turning to the L. Pt questioned pt on visual deficits, and pt reports that he has pre-morbid blurriness on the L and now has double vision on the R, but the image in clear, but struggles to keep eye open 2/2 ptosis in the R eye.   Dynamic balance training to perform reciprocal foot taps to target on floor then perform reciprocal foot taps to 4 inch step. Min-mod assist with mild posterior/R LOB throughout. Which pt able to correct when instructed to stop, regain midline, and then continue. Unless instructed by PT, pt consistently attempted to continue stepping task while losing balance.    Pt returned to room and performed stand pivot transfer to bed with HHA on the L and min assist. Pt have reach for bed rail with greater accuracy on the R than when reaching towards the L. Sit>supine completed with supervision assist and cues for cervial precautions. Pt left supine in bed with collar  in place, call bell in reach and all needs met.           Therapy Documentation Precautions:  Precautions Precautions: Fall Precaution Comments: bone flap in abdomen, helmet when OOB Required Braces or Orthoses: Cervical Brace, Other Brace Cervical Brace: Hard collar, At all times Other Brace: helmet for OOB  Restrictions Weight Bearing Restrictions: No    Vital Signs: Therapy Vitals Temp: 98.1 F (36.7 C) Pulse Rate: 100 Resp: 16 BP: 103/64 Patient Position (if appropriate): Lying Oxygen Therapy SpO2: 100 % O2 Device: Room Air Pain: Pain Assessment Pain Scale: Faces Faces Pain Scale: Hurts whole lot Pain Type: Acute pain Pain Location: Head Pain Orientation: Posterior Pain Descriptors / Indicators: Headache;Aching Pain Frequency: Constant Pain Onset: On-going Patients Stated Pain Goal: 4 Pain Intervention(s): Medication (See eMAR) (tylenol and oxycodone given)   Therapy/Group: Individual Therapy  AuLorie Phenix/17/2021, 5:05 PM

## 2019-09-22 NOTE — Progress Notes (Signed)
[facility] PHYSICAL MEDICINE & REHABILITATION PROGRESS NOTE   Subjective/Complaints: Slept for most of night. Appears comfortable. Doesn't indicate pain  ROS: Limited due to cognitive/behavioral   Objective:   No results found. Recent Labs    09/21/19 0643  WBC 11.9*  HGB 13.1  HCT 39.5  PLT 729*   Recent Labs    09/20/19 1720 09/21/19 0643  NA  --  141  K  --  4.2  CL  --  101  CO2  --  29  GLUCOSE  --  105*  BUN  --  16  CREATININE 0.65 0.74  CALCIUM  --  10.0    Intake/Output Summary (Last 24 hours) at 09/22/2019 1053 Last data filed at 09/22/2019 0756 Gross per 24 hour  Intake 696 ml  Output 500 ml  Net 196 ml     Physical Exam: Vital Signs Blood pressure 122/84, pulse 68, temperature 98.7 F (37.1 C), temperature source Oral, resp. rate 16, height 5\' 8"  (1.727 m), weight 65.4 kg, SpO2 100 %.  Constitutional: No distress . Vital signs reviewed. HEENT: EOMI, oral membranes moist Neck: supple Cardiovascular: RRR without murmur. No JVD    Respiratory/Chest: CTA Bilaterally without wheezes or rales. Normal effort    GI/Abdomen: BS +, non-tender, non-distended Ext: no clubbing, cyanosis, or edema Psych: flat but does cooperate Skin: crani and abd sites CDI Neuro: fairly alert. Right CN III weakness. Follows basic commands. Limited insight and awareness. Oriented to self and hospital. Moves all 4's. No resting tone. senses pain in all 4's. Musculoskeletal: Full ROM, No pain with AROM or PROM in the neck, trunk, or extremities. Posture appropriate       Assessment/Plan: 1. 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  Physiatrist is providing close team supervision and 24 hour management of active medical problems listed below.  Physiatrist and rehab team continue to assess barriers to discharge/monitor patient progress toward functional and medical  goals  Care Tool:  Bathing    Body parts bathed by patient: Right arm, Left arm, Chest, Abdomen, Front perineal area, Right upper leg, Left upper leg, Face   Body parts bathed by helper: Right lower leg, Left lower leg, Buttocks     Bathing assist Assist Level: Moderate Assistance - Patient 50 - 74%     Upper Body Dressing/Undressing Upper body dressing Upper body dressing/undressing activity did not occur (including orthotics): N/A What is the patient wearing?: Pull over shirt    Upper body assist Assist Level: Minimal Assistance - Patient > 75%    Lower Body Dressing/Undressing Lower body dressing    Lower body dressing activity did not occur: N/A What is the patient wearing?: Hospital gown only     Lower body assist Assist for lower body dressing: Maximal Assistance - Patient 25 - 49%     Toileting Toileting    Toileting assist Assist for toileting: Minimal Assistance - Patient > 75%     Transfers Chair/bed transfer  Transfers assist  Chair/bed transfer activity did not occur: N/A  Chair/bed transfer assist level: Minimal Assistance - Patient > 75%     Locomotion Ambulation   Ambulation assist      Assist level: Minimal Assistance - Patient > 75% Assistive device: Hand held assist Max distance: 50   Walk 10 feet activity   Assist     Assist level: Minimal Assistance - Patient > 75% Assistive device:  Hand held assist   Walk 50 feet activity   Assist    Assist level: Minimal Assistance - Patient > 75% Assistive device: Hand held assist    Walk 150 feet activity   Assist Walk 150 feet activity did not occur: Safety/medical concerns         Walk 10 feet on uneven surface  activity   Assist Walk 10 feet on uneven surfaces activity did not occur: Safety/medical concerns         Wheelchair     Assist   Type of Wheelchair: Manual    Wheelchair assist level: Moderate Assistance - Patient 50 - 74% Max wheelchair  distance: 150    Wheelchair 50 feet with 2 turns activity    Assist        Assist Level: Moderate Assistance - Patient 50 - 74%   Wheelchair 150 feet activity     Assist      Assist Level: Moderate Assistance - Patient 50 - 74%   Blood pressure 122/84, pulse 68, temperature 98.7 F (37.1 C), temperature source Oral, resp. rate 16, height 5\' 8"  (1.727 m), weight 65.4 kg, SpO2 100 %.  Medical Problem List and Plan: 1.Decreased functional mobility with altered mental statussecondary to traumatic bilateral SDH/SAH. Status post right frontotemporoparietal craniectomy evacuation of acute subdural hematoma and insertion of right frontal ventriculostomy insertion of craniotomy flap into the abdominal subcutaneous tissue 09/05/2019. Helmet when out of bed. -patient maynot yetshower -ELOS/Goals: 2-3 weeks, supervision goals  --Patient continues CIR therapies with PT, OT, and SLP   -soft helmet when up 2. Antithrombotics: -DVT/anticoagulation:Lovenox 09/09/2019. Venous Doppler studies upper lower extremities negative except superficial cephalic vein thrombosis -antiplatelet therapy: N/A 3. Pain Management:Lidoderm patch,Robaxin 500 mg every 8 hours and oxycodone as needed 4. Mood:Provide emotional support -antipsychotic agents: N/A - cont sleep wake chart 5. Neuropsych: This patientis notcapable of making decisions on hisown behalf. 6. Skin/Wound Care:Routine skin checks.  -local care to abdomen and scalp, both clean 7. Fluids/Electrolytes/Nutrition:encourage PO  -I personally reviewed the patient's labs today.   8. L1/2 right transverse process fractures. Conservative care 9. Urinary retention.  Improving, pvr's 0-100, intermittently req I/O cath  -Continue Urecholine.     -Recent urinalysis negative 10. Dysphagia. Dysphagia #3 thin liquids. Follow-up speech therapy 11. Leukocytosis:  likely reactive, wbc's falling    LOS: 2 days A FACE TO FACE EVALUATION WAS PERFORMED  Meredith Staggers 09/22/2019, 10:53 AM

## 2019-09-22 NOTE — Progress Notes (Signed)
Speech Language Pathology Daily Session Note  Patient Details  Name: Jason Skinner MRN: [id number] Date of Birth: 01/10/1991  Today's Date: 09/22/2019 SLP Individual Time: 9833-8250 SLP Individual Time Calculation (min): 45 min  Short Term Goals: Week 1: SLP Short Term Goal 1 (Week 1): Pt will consume dysphagia 3 solid texture diet and thin liquids with minimal overt s/sx aspiration and Supervision A verbal cues for use of strategies to clear oral residue. SLP Short Term Goal 2 (Week 1): Pt will demonstrate ability to use compensatory memory strategies to recall new and/or daily information with Min A verbal/visual cues. SLP Short Term Goal 3 (Week 1): Pt will verbally identify 1 physical and 1 cognitive impairment with Mod A verbal/visual cues. SLP Short Term Goal 4 (Week 1): Pt will demonstrate ability to problem solve functional situations with Min A verbal/visual cues. SLP Short Term Goal 5 (Week 1): Pt will selectively attend to functional tasks with Min A verbal/visual cues.  Skilled Therapeutic Interventions: Skilled treatment session focused on cognitive goals. Upon arrival, patient was asleep in bed with his c-collar off. SLP re-applied the C-collar and educated both the patient and his brother on the importance of using the C-collar at all times. Both verbalized understanding. SLP facilitated session by providing extra time and Mod A verbal cues for basic problem solving during a money management task. Patient appeared lethargic throughout the session and required Mod verbal and visual cues to self-monitor and correct errors. Patient left upright in bed with alarm on and all needs within reach. Continue with current plan of care.      Pain No/Denies Pain  Therapy/Group: Individual Therapy  Allura Doepke 09/22/2019, 12:23 PM

## 2019-09-22 NOTE — Progress Notes (Signed)
Occupational Therapy Session Note  Patient Details  Name: Jason Skinner MRN: [id number] Date of Birth: 10-Jan-1991  Today's Date: 09/22/2019 OT Individual Time: 6203-5597 OT Individual Time Calculation (min): 43 min    Short Term Goals: Week 1:  OT Short Term Goal 1 (Week 1): Pt will transfer to toilet with CGA and LRAD OT Short Term Goal 2 (Week 1): Pt will thread BLE into pants OT Short Term Goal 3 (Week 1): Pt will complete UB dressing with S OT Short Term Goal 4 (Week 1): Pt will groom standing at sink to demo improved endurance  Skilled Therapeutic Interventions/Progress Updates:    Pt resting in bed in sidelying upon arrival.  Pt was not wearing cervical collar and he was talking on phone.  Brother and interpreter present.  Educated pt and brother on importance of wearing collar and consequences could result in paralysis. Pt and brother verbalized understanding.  Pt did not attempt to remove collar during session. OT intervention with focus on following one step commands, forced use of RUE, and visual assessment.  Pt reports double vision with improvement noted with single eye occluded. Pt also reports better monocular vision with R eye although blurry.  Pt unable to track in superior visual fields. Decreased peripheral vision noted with limited assessment.  Pt completed simple peg board pattern with min verbal cues to use RUE. Pt completed clock face with no errors using RUE. Pt able to dissect horizontal line at midline with no errors. Pt returned to room and transferred back to bed with CGA. Emphasized importance of keeping collar on at all times. Bed alarm activated and all needs within reach. Brother present.    Therapy Documentation Precautions:  Precautions Precautions: Fall Precaution Comments: bone flap in abdomen, helmet when OOB Required Braces or Orthoses: Cervical Brace, Other Brace Cervical Brace: Hard collar, At all times Other Brace: helmet for OOB  Restrictions Weight  Bearing Restrictions: No   Pain: Pt c/o head pain in occipital area; RN Meikedes notifed and repositioned  Therapy/Group: Individual Therapy  Leroy Libman 09/22/2019, 12:07 PM

## 2019-09-22 NOTE — Progress Notes (Signed)
Physical Therapy Session Note  Patient Details  Name: Jason Skinner MRN: [id number] Date of Birth: [birth year]  Today's Date: 09/22/2019 PT Individual Time: 1010-1105 PT Individual Time Calculation (min): 55 min   Short Term Goals: Week 1:  PT Short Term Goal 1 (Week 1): Pt will perform bed mobility with supervision assist PT Short Term Goal 2 (Week 1): Pt will transfer to and from Bed with CGA and LRAD PT Short Term Goal 3 (Week 1): Pt will ambulate 164f with CGA and LRAD PT Short Term Goal 4 (Week 1): Pt will demonstrate awareness of R side 50% of the time with min cues  Skilled Therapeutic Interventions/Progress Updates:   Pt received supine in bed, no cervical collar and in partial sidelying. PT assisted pt to don C-collar with total A and education to pt and brother on importance of maintaining C-collar to protect spinal cord. Supine>sit transfer withsuperivsin assist and cues to prevent cervical rotation in transfer. Sit<>stand from EOB with CGA and UE support on RW to pull pants to waist. Pt able to pull pants up with encouragement and min assist to prevent posterior LOB.   Pt transported to rehab gym Gait training with RW x 1466fwith min-mod assist. Mod-max cues to avoid obstacles on the R and the L on this day with noted scissoring throughout requiring mod assist to prevent lateral R LOB. PT  instructed pt in TUG: 24 sec with HHA on PT. And 30 sec with RW.  (average of 3 trials; >13.5 sec indicates increased fall risk) noted to have improved gait pattern, safety, and postural control without AD.   Pt performed 5xSTS: 28 sec (>15 sec indicates increased fall risk)  HHA throughout   dynicac balance training with fine motor task of push-pin puzzle. Pt completed 75% with the LUE and 25% with the RUE. Cues for improved visual scanning to the R.   Pt returned to room and performed stand pivot transfer to bed with HHA. Sit>supine completed with supervision assist and cues for cervical  precautions.  and left supine in bed with call bell in reach and all needs met.                 Therapy Documentation Precautions:  Precautions Precautions: Fall Precaution Comments: bone flap in abdomen, helmet when OOB Required Braces or Orthoses: Cervical Brace, Other Brace Cervical Brace: Hard collar, At all times Other Brace: helmet for OOB  Restrictions Weight Bearing Restrictions: No    Pain: Pain Assessment Pain Scale: Faces Faces Pain Scale: Hurts even more Pain Type: Acute pain Pain Location: Head Pain Descriptors / Indicators: Headache Pain Frequency: Constant Pain Onset: On-going Patients Stated Pain Goal: 4 Pain Intervention(s): Medication (See eMAR) (tylenol given)   Therapy/Group: Individual Therapy  AuLorie Phenix/17/2021, 11:14 AM

## 2019-09-23 NOTE — IPOC Note (Signed)
Overall Plan of Care [facility]) Patient Details Name: Jason Skinner MRN: [id number] DOB: 13-Mar-1990  Admitting Diagnosis: Traumatic subdural hematoma [facility])  Hospital Problems: Principal Problem:   Traumatic subdural hematoma ([place])     Functional Problem List: Nursing Bladder, Edema, Endurance, Medication Management, Motor, Nutrition, Pain, Perception, Safety, Sensory, Skin Integrity  PT Balance, Behavior, Edema, Endurance, Motor, Nutrition, Pain, Perception, Safety, Skin Integrity, Sensory  OT Balance, Behavior, Cognition, Endurance, Motor, Pain, Perception, Safety, Vision  SLP Cognition, Nutrition  TR         Basic ADL's: OT Grooming, Bathing, Dressing, Toileting, Eating     Advanced  ADL's: OT Simple Meal Preparation     Transfers: PT Bed Mobility, Bed to Chair, Car, Furniture, Floor  OT Toilet, Metallurgist: PT Ambulation, Emergency planning/management officer, Stairs     Additional Impairments: OT Fuctional Use of Upper Extremity  SLP Swallowing, Social Cognition   Problem Solving, Memory, Attention, Awareness  TR      Anticipated Outcomes Item Anticipated Outcome  Self Feeding S  Swallowing  Mod I   Basic self-care  S  Toileting  S   Bathroom Transfers S  Bowel/Bladder  Min I assits with B&B  Transfers  Supervision assist with LRAD  Locomotion  AMbulatory with LRAD at supervision assist level  Communication     Cognition  Supervision A  Pain  Pain goal of 3 on pain scale of 0-10  Safety/Judgment  Min I assist with safety and judgement   Therapy Plan: PT Intensity: Minimum of 1-2 x/day ,45 to 90 minutes PT Frequency: 5 out of 7 days PT Duration Estimated Length of Stay: 10-14 days OT Intensity: Minimum of 1-2 x/day, 45 to 90 minutes OT Frequency: 5 out of 7 days OT Duration/Estimated Length of Stay: 10-14 SLP Intensity: Minumum of 1-2 x/day, 30 to 90 minutes SLP Frequency: 3 to 5 out of 7 days SLP Duration/Estimated Length of Stay: 10-14 days   Due  to the current state of emergency, patients may not be receiving their 3-hours of Medicare-mandated therapy.   Team Interventions: Nursing Interventions Patient/Family Education, Pain Management, Medication Management, Discharge Planning, Bladder Management, Skin Care/Wound Management, Psychosocial Support, Cognitive Remediation/Compensation  PT interventions Ambulation/gait training, Community reintegration, DME/adaptive equipment instruction, Neuromuscular re-education, Stair training, Psychosocial support, UE/LE Strength taining/ROM, Wheelchair propulsion/positioning, UE/LE Coordination activities, Therapeutic Activities, Skin care/wound management, Pain management, Functional electrical stimulation, Discharge planning, Balance/vestibular training, Cognitive remediation/compensation, Disease management/prevention, Functional mobility training, Patient/family education, Splinting/orthotics  OT Interventions Balance/vestibular training, Discharge planning, Pain management, Self Care/advanced ADL retraining, Therapeutic Activities, UE/LE Coordination activities, Visual/perceptual remediation/compensation, Therapeutic Exercise, Skin care/wound managment, Patient/family education, Functional mobility training, Disease mangement/prevention, Cognitive remediation/compensation, Community reintegration, Engineer, drilling, Neuromuscular re-education, Psychosocial support, Splinting/orthotics, UE/LE Strength taining/ROM, Wheelchair propulsion/positioning  SLP Interventions Cognitive remediation/compensation, English as a second language teacher, Dysphagia/aspiration precaution training, Functional tasks, Patient/family education, Internal/external aids  TR Interventions    SW/CM Interventions Discharge Planning, Psychosocial Support, Patient/Family Education   Barriers to Discharge MD  Medical stability  Nursing Decreased caregiver support, Lack of/limited family support little family in town. Wife and children in  [place]  PT Inaccessible home environment, Decreased caregiver support, Home environment access/layout, Lack of/limited family support, Insurance for SNF coverage, Weight bearing restrictions, Behavior    OT Decreased caregiver support, Lack of/limited family support unsure if family will be able to provide 24/7 supervision  SLP      SW Decreased caregiver support, Lack of/limited family support     Team Discharge Planning: Destination: PT-Home ,  OT- Home , SLP-Home Projected Follow-up: PT-Home health PT, OT-  Home health OT, SLP-24 hour supervision/assistance (follow up ST TBD depending on progress with in CIR) Projected Equipment Needs: PT-Rolling walker with 5" wheels, To be determined, OT- 3 in 1 bedside comode, Tub/shower seat, To be determined, SLP-None recommended by SLP Equipment Details: PT- , OT-TBD based on d/c plan per uncle Patient/family involved in discharge planning: PT- Patient,  OT-Family member/caregiver, SLP-Patient, Family member/caregiver  MD ELOS: 10-14 days Medical Rehab Prognosis:  Excellent Assessment: The patient has been admitted for CIR therapies with the diagnosis of TBI s/p craniectomy after fall. The team will be addressing functional mobility, strength, stamina, balance, safety, adaptive techniques and equipment, self-care, bowel and bladder mgt, patient and caregiver education, NMR, cognition, communication, behavior, ego support. Goals have been set at supervision to mod I with basic mobility, self-care and cognition/communication.   Due to the current state of emergency, patients may not be receiving their 3 hours per day of Medicare-mandated therapy.    Meredith Staggers, MD, FAAPMR      See Team Conference Notes for weekly updates to the plan of care

## 2019-09-23 NOTE — Progress Notes (Signed)
[facility] PHYSICAL MEDICINE & REHABILITATION PROGRESS NOTE   Subjective/Complaints: Slept most of night. No problems reported. Pt is comfortable  ROS: Limited due to cognitive/behavioral   Objective:   No results found. Recent Labs    09/21/19 0643  WBC 11.9*  HGB 13.1  HCT 39.5  PLT 729*   Recent Labs    09/20/19 1720 09/21/19 0643  NA  --  141  K  --  4.2  CL  --  101  CO2  --  29  GLUCOSE  --  105*  BUN  --  16  CREATININE 0.65 0.74  CALCIUM  --  10.0    Intake/Output Summary (Last 24 hours) at 09/23/2019 1020 Last data filed at 09/23/2019 0900 Gross per 24 hour  Intake 600 ml  Output 750 ml  Net -150 ml     Physical Exam: Vital Signs Blood pressure 109/74, pulse 73, temperature 98 F (36.7 C), resp. rate 17, height 5\' 8"  (1.727 m), weight 65.4 kg, SpO2 99 %.  Constitutional: No distress . Vital signs reviewed. HEENT: EOMI, oral membranes moist Neck: supple Cardiovascular: RRR without murmur. No JVD    Respiratory/Chest: CTA Bilaterally without wheezes or rales. Normal effort    GI/Abdomen: BS +, non-tender, non-distended Ext: no clubbing, cyanosis, or edema Psych: flat but cooperative Skin: crani and abd sites remain CDI Neuro: fairly alert. Right CN III weakness. Follows basic commands. Limited insight and awareness. Oriented to self and hospital. Moves all 4's. No resting tone. senses pain in all 4's. Musculoskeletal: Full ROM, No pain with AROM or PROM in the neck, trunk, or extremities. Posture appropriate       Assessment/Plan: 1. 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  Physiatrist is providing close team supervision and 24 hour management of active medical problems listed below.  Physiatrist and rehab team continue to assess barriers to discharge/monitor patient progress toward functional and medical goals  Care  Tool:  Bathing    Body parts bathed by patient: Right arm, Left arm, Chest, Abdomen, Front perineal area, Right upper leg, Left upper leg, Face   Body parts bathed by helper: Right lower leg, Left lower leg, Buttocks     Bathing assist Assist Level: Moderate Assistance - Patient 50 - 74%     Upper Body Dressing/Undressing Upper body dressing Upper body dressing/undressing activity did not occur (including orthotics): N/A What is the patient wearing?: Pull over shirt    Upper body assist Assist Level: Minimal Assistance - Patient > 75%    Lower Body Dressing/Undressing Lower body dressing    Lower body dressing activity did not occur: N/A What is the patient wearing?: Incontinence brief, Hospital gown only     Lower body assist Assist for lower body dressing: Maximal Assistance - Patient 25 - 49%     Toileting Toileting    Toileting assist Assist for toileting: Minimal Assistance - Patient > 75%     Transfers Chair/bed transfer  Transfers assist  Chair/bed transfer activity did not occur: N/A  Chair/bed transfer assist level: Minimal Assistance - Patient > 75%     Locomotion Ambulation   Ambulation assist      Assist level: Minimal Assistance - Patient > 75% Assistive device: Hand held assist Max distance: 50   Walk 10 feet activity   Assist     Assist level: Minimal Assistance - Patient > 75% Assistive device: Hand  held assist   Walk 50 feet activity   Assist    Assist level: Minimal Assistance - Patient > 75% Assistive device: Hand held assist    Walk 150 feet activity   Assist Walk 150 feet activity did not occur: Safety/medical concerns         Walk 10 feet on uneven surface  activity   Assist Walk 10 feet on uneven surfaces activity did not occur: Safety/medical concerns         Wheelchair     Assist   Type of Wheelchair: Manual    Wheelchair assist level: Moderate Assistance - Patient 50 - 74% Max wheelchair  distance: 150    Wheelchair 50 feet with 2 turns activity    Assist        Assist Level: Moderate Assistance - Patient 50 - 74%   Wheelchair 150 feet activity     Assist      Assist Level: Moderate Assistance - Patient 50 - 74%   Blood pressure 109/74, pulse 73, temperature 98 F (36.7 C), resp. rate 17, height 5\' 8"  (1.727 m), weight 65.4 kg, SpO2 99 %.  Medical Problem List and Plan: 1.Decreased functional mobility with altered mental statussecondary to traumatic bilateral SDH/SAH. Status post right frontotemporoparietal craniectomy evacuation of acute subdural hematoma and insertion of right frontal ventriculostomy insertion of craniotomy flap into the abdominal subcutaneous tissue 09/05/2019. Helmet when out of bed. -patient maynot yetshower -ELOS/Goals: 2-3 weeks, supervision goals  --Patient continues CIR therapies with PT, OT, and SLP   -soft helmet when OOB 2. Antithrombotics: -DVT/anticoagulation:Lovenox 09/09/2019. Venous Doppler studies upper lower extremities negative except superficial cephalic vein thrombosis -antiplatelet therapy: N/A 3. Pain Management:Lidoderm patch,Robaxin 500 mg every 8 hours and oxycodone as needed 4. Mood:Provide emotional support -antipsychotic agents: N/A - cont sleep wake chart 5. Neuropsych: This patientis notcapable of making decisions on hisown behalf. 6. Skin/Wound Care:Routine skin checks.  -local care to abdomen and scalp, both clean 7. Fluids/Electrolytes/Nutrition:encourage PO  - eating very well  8. L1/2 right transverse process fractures. Conservative care 9. Urinary retention.  Improving, pvr's 0-100, intermittently req I/O cath  -Continue Urecholine--probably can wean soon     -Recent urinalysis negative 10. Dysphagia. Dysphagia #3 thin liquids. Follow-up speech therapy 11. Leukocytosis/thrombocytosis: likely reactive,  wbc's falling   -recheck Monday  LOS: 3 days A FACE TO [place] 09/23/2019, 10:20 AM

## 2019-09-24 ENCOUNTER — Inpatient Hospital Stay (HOSPITAL_COMMUNITY): Payer: Self-pay

## 2019-09-24 ENCOUNTER — Inpatient Hospital Stay (HOSPITAL_COMMUNITY): Payer: Self-pay | Admitting: Speech Pathology

## 2019-09-24 DIAGNOSIS — R339 Retention of urine, unspecified: Secondary | ICD-10-CM

## 2019-09-24 DIAGNOSIS — G8918 Other acute postprocedural pain: Secondary | ICD-10-CM

## 2019-09-24 DIAGNOSIS — R1310 Dysphagia, unspecified: Secondary | ICD-10-CM

## 2019-09-24 DIAGNOSIS — R32 Unspecified urinary incontinence: Secondary | ICD-10-CM

## 2019-09-24 DIAGNOSIS — D473 Essential (hemorrhagic) thrombocythemia: Secondary | ICD-10-CM

## 2019-09-24 DIAGNOSIS — D75839 Thrombocytosis, unspecified: Secondary | ICD-10-CM

## 2019-09-24 LAB — CBC
HCT: 39.4 % (ref 39.0–52.0)
Hemoglobin: 12.9 g/dL — ABNORMAL LOW (ref 13.0–17.0)
MCH: 31.1 pg (ref 26.0–34.0)
MCHC: 32.7 g/dL (ref 30.0–36.0)
MCV: 94.9 fL (ref 80.0–100.0)
Platelets: 696 10*3/uL — ABNORMAL HIGH (ref 150–400)
RBC: 4.15 MIL/uL — ABNORMAL LOW (ref 4.22–5.81)
RDW: 14.9 % (ref 11.5–15.5)
WBC: 10.4 10*3/uL (ref 4.0–10.5)
nRBC: 0 % (ref 0.0–0.2)

## 2019-09-24 MED ORDER — BETHANECHOL CHLORIDE 10 MG PO TABS
10.0000 mg | ORAL_TABLET | Freq: Three times a day (TID) | ORAL | Status: DC
  Administered 2019-09-24 – 2019-10-05 (×33): 10 mg via ORAL
  Filled 2019-09-24 (×33): qty 1

## 2019-09-24 NOTE — Care Management (Signed)
[facility] Individual Statement of Services  Patient Name:  Jason Skinner  Date:  09/24/2019  Welcome to the [facility].  Our goal is to provide you with an individualized program based on your diagnosis and situation, designed to meet your specific needs.  With this comprehensive rehabilitation program, you will be expected to participate in at least 3 hours of rehabilitation therapies Monday-Friday, with modified therapy programming on the weekends.  Your rehabilitation program will include the following services:  Physical Therapy (PT), Occupational Therapy (OT), Speech Therapy (ST), 24 hour per day rehabilitation nursing, Therapeutic Recreaction (TR), Psychology, Neuropsychology, Care Coordinator, Rehabilitation Medicine, Nutrition Services, Pharmacy Services and Other  Weekly team conferences will be held on Tuesdays to discuss your progress.  Your Inpatient Rehabilitation Care Coordinator will talk with you frequently to get your input and to update you on team discussions.  Team conferences with you and your family in attendance may also be held.  Expected length of stay: 10-14 days  Overall anticipated outcome: Supervision  Depending on your progress and recovery, your program may change. Your Inpatient Rehabilitation Care Coordinator will coordinate services and will keep you informed of any changes. Your Inpatient Rehabilitation Care Coordinator's name and contact numbers are listed  below.  The following services may also be recommended but are not provided by the [facility] will be made to provide these services after discharge if needed.  Arrangements include referral to agencies that provide these services.  Your insurance has been verified to be:  Uninsured  Your primary  doctor is:  No PCP  Pertinent information will be shared with your doctor and your insurance company.  Inpatient Rehabilitation Care Coordinator:  Cathleen Corti [phone] or ([phone]  Information discussed with and copy given to patient by: Rana Snare, 09/24/2019, 9:38 AM

## 2019-09-24 NOTE — Progress Notes (Addendum)
Physical Therapy Session Note  Patient Details  Name: Jason Skinner MRN: [id number] Date of Birth: 07/10/1990  Today's Date: 09/24/2019 PT Individual Time: 1110-1140 PT Individual Time Calculation (min): 30 min   Short Term Goals: Week 1:  PT Short Term Goal 1 (Week 1): Pt will perform bed mobility with supervision assist PT Short Term Goal 2 (Week 1): Pt will transfer to and from Bed with CGA and LRAD PT Short Term Goal 3 (Week 1): Pt will ambulate 141ft with CGA and LRAD PT Short Term Goal 4 (Week 1): Pt will demonstrate awareness of R side 50% of the time with min cues  Skilled Therapeutic Interventions/Progress Updates:     Patient in bed asleep with [place] J collar donned upon PT arrival. Patient easily aroused to verbal stimulation and agreeable to PT session. Patient reported 5/10 headache during session, RN made aware. PT provided repositioning, rest breaks, and distraction as pain interventions throughout session. Patient also squinting with both eyes and covering eyes, reported photophobia when asked. Closed blinds, turned off lights, and provided patient with sunglasses to reduce symptoms. Patient initially denied vision changes, but upon further inquiary did report double vision.  Interpreter not present at beginning of session. [employer] Interpretation system obtained and used until in-person interpreter arrived and remained until the end of the session. Patient's brother and friend came in and observed session. Denied questions or concerns at this time.   Therapeutic Activity: Bed Mobility: Patient performed supine to/from sit with supervision with HOB elevated. Patient reported dizziness in sitting, resolved with education on using a focal point (the tip of his finger). No nystagmus noted with change in position, however, difficulty to see due to R ptosis and patient squinting due to photophobia.  Once in sitting, patient initially declined any further mobility due to fatigue. Patient  reports he did not sleep well due to another patient yelling last night. With encouragement, patient was agreeable to ambulating x1 before returning to lying in bed Transfers: Patient performed sit to/from stand x1 with min A using HHA. Provided verbal cues for standing to assess for dizziness before ambulating. Patient denied dizziness in standing.  Gait Training:  Patient ambulated ~175 feet using HHA with min A. Ambulated with ataxic gait with narrow BOS and intermittent scissoring gait with lateral LOB L>R that was worse with turning requiring min A to correct. Provided verbal cues for increased step height and increased knee/hip extension in stance while providing manual facilitation for weight shifting. Following gait training, patient reported that he was dizzy while ambulating, but denied dizziness when asked during ambulation. Educated on informing therapist's about symptoms as they occur. Patient stated understanding.  Vitals: BP 115/76, HR 79, SPO2 98%.   Educated patient on expectations for PT, POC, and PT goals. Patient asking about when he will have cranioplasty to return his skill flap. differed to medical team, however provided education on purpose of craniectomy.    Educated patient and family on having cervical collar donned at all times until medically released and to have soft helmet donned when OOB for safety.   Patient in bed with his brother and friend in the room at end of session with breaks locked, bed alarm set, and all needs within reach.    Therapy Documentation Precautions:  Precautions Precautions: Fall Precaution Comments: bone flap in abdomen, helmet when OOB Required Braces or Orthoses: Cervical Brace, Other Brace Cervical Brace: Hard collar, At all times Other Brace: helmet for OOB  Restrictions Weight Bearing Restrictions:  No General: PT Amount of Missed Time (min): 15 Minutes    Therapy/Group: Individual Therapy  Jason Skinner L Jason Skinner PT,  DPT  09/24/2019, 12:24 PM

## 2019-09-24 NOTE — Progress Notes (Signed)
Patient stated that he takes the [place] off "because it hurts his head and upper back". The brace does rub against the lower part of the craniotomy incision which still has staples in place. Writer spoke with Helyn Numbers. PA about this and he is following up with the surgical team to discover if the brace is still needed or could be changed to an [place] which does not come up as high.

## 2019-09-24 NOTE — Progress Notes (Addendum)
Patient ID: Jason Skinner, male   DOB: January 18, 1991, 29 y.o.   MRN: [id number]   SW met with pt, pt brother [name],and cousin in room to provide statement of service utilizing [employer] Spanish interpreter Jesus ID [phone]. SW informed will provide updates after team conference. Pt was curious to know when he would have post surgery follow-up. SW informed will relay to physician concerns.   Loralee Pacas, MSW, [place] Office: [phone] Cell: [phone] Fax: [phone]

## 2019-09-24 NOTE — Progress Notes (Signed)
Occupational Therapy Session Note  Patient Details  Name: Jason Skinner MRN: [id number] Date of Birth: 09/13/1990  Today's Date: 09/24/2019 OT Individual Time: 1000-1035 OT Individual Time Calculation (min): 35 min    Session 2: OT Individual Time: 3785-8850 OT Individual Time Calculation (min): 60 min  15 min missed 2/2 fatigue   Short Term Goals: Week 1:  OT Short Term Goal 1 (Week 1): Pt will transfer to toilet with CGA and LRAD OT Short Term Goal 2 (Week 1): Pt will thread BLE into pants OT Short Term Goal 3 (Week 1): Pt will complete UB dressing with S OT Short Term Goal 4 (Week 1): Pt will groom standing at sink to demo improved endurance  Skilled Therapeutic Interventions/Progress Updates:    Session 1: Pt received supine with c/o pain 8/10. Interpretor present. Pt fully oriented. Pt completed bed mobility with (S) to EOB. C-collar on but very loose, OT tightened EOB and reinforced previously provided education re need to wear and tightly. Pt also required cueing to don helmet. Stool noticed on bed and pt not wearing brief. Pt donned new brief EOB and stood to complete peri hygiene with min A. Pt also donned pants with CGA. Pt completed 150 ft of functional mobility with CGA using the RW, frequently with L inattention/visual disturbance. Pt completed dynamic standing balance activity, not using AE, and requiring mod A for frequent LOB when attempting to navigate around cones. Frequent cueing for small, slow steps. Completed visual motor activity, reaching near/far for cones with self occlusion of R eye for diplopia. Over/undershooting frequently. Pt returned to his room and was left supine with all needs met.    Session 2:  Pt received supine, NOT wearing C-collar. Pt agreeable to wear c collar once offered. Interpretor arrived and present for session. Pt completed bed mobiltiy to EOB with (S). Sign posted in [place] [facility] [place] for helmet use OOB. Pt donned helmet with min questioning  cues. Pt completed functional mobility with min HHA to the sink. Pt required cueing for initiation of bathing at the sink. Min A to doff shirt, to get over [place] collar. Pt washed UB with set up assist and min cueing for thoroughness. Min A to don new shirt, to pull over head and collar. Pt completed standing level peri area hygiene with CGA for standing balance. Cueing required to don/doff pants while seated instead of standing. Pt donned new pants and brief with CGA.  Pt able to complete oral care with set up assist, and cueing for termination. Pt requested to return to bed 2/2 fatigue and headache (5/10). After rest break supine pt completed functional reaching activity with R and L UE to address under/overshooting and functional grasp strength. Pt took another supine break before being agreeable to completing functional mobility. 150 ft of functional mobility completed with min HHA. Pt was left supine with all needs met, bed alarm set. C-collar on.   BP assessed throughout session to check if OH could be a factor of dizziness- all WFL and no significant change.   Therapy Documentation Precautions:  Precautions Precautions: Fall Precaution Comments: bone flap in abdomen, helmet when OOB Required Braces or Orthoses: Cervical Brace, Other Brace Cervical Brace: Hard collar, At all times Other Brace: helmet for OOB  Restrictions Weight Bearing Restrictions: No   Therapy/Group: Individual Therapy  Curtis Sites 09/24/2019, 6:51 AM

## 2019-09-24 NOTE — Progress Notes (Signed)
Speech Language Pathology Daily Session Note  Patient Details  Name: Jason Skinner MRN: [id number] Date of Birth: Nov 03, 1990  Today's Date: 09/24/2019 SLP Individual Time: 0211-1552 SLP Individual Time Calculation (min): 55 min  Short Term Goals: Week 1: SLP Short Term Goal 1 (Week 1): Pt will consume dysphagia 3 solid texture diet and thin liquids with minimal overt s/sx aspiration and Supervision A verbal cues for use of strategies to clear oral residue. SLP Short Term Goal 2 (Week 1): Pt will demonstrate ability to use compensatory memory strategies to recall new and/or daily information with Min A verbal/visual cues. SLP Short Term Goal 3 (Week 1): Pt will verbally identify 1 physical and 1 cognitive impairment with Mod A verbal/visual cues. SLP Short Term Goal 4 (Week 1): Pt will demonstrate ability to problem solve functional situations with Min A verbal/visual cues. SLP Short Term Goal 5 (Week 1): Pt will selectively attend to functional tasks with Min A verbal/visual cues.  Skilled Therapeutic Interventions: Skilled treatment session focused on cognitive goals. Upon arrival, patient was upright in bed without the C-collar on. SLP provided education to the patient and his brother AGAIN about the importance of keeping the C-collar on at all times. Interpreter present. Both verbalized understanding. Patient had just received breakfast and asked his brother to feed him. SLP provided education on the importance of the patient participating in functional tasks as much as possible to maximize independence. Patient self-fed his breakfast meal with Mod verbal cues for initiation and attention with SLP having to intermittently scoop food onto the utensil. Patient consumed meal without overt s/s of aspiration, recommend patient continue current diet. SLP also provided external aids to maximize recall of date and place which he was able to utilize with extra time and supervision verbal cues for visual  scanning. Patient left upright in bed with alarm on and all needs within reach. Continue with current plan of care.      Pain Discomfort with C-collar, RN aware and addressing  Therapy/Group: Individual Therapy  [name] 09/24/2019, 12:38 PM

## 2019-09-24 NOTE — Plan of Care (Addendum)
Behavioral Plan   Rancho Level: Rancho 6  Behavior to decrease/ eliminate:  maximize independence with self feeding with family; reinforce safety plan   Changes to environment:  - blinds down, lights off - monitor vitals with mobility/monitor dizziness - ensure helmet is on for all mobility (sign above bed) -HOB >30 degrees at all times, per MD orders - reiforce with family patient should self feed all meals   Interventions: - wear sunglasses for out of room experience - sleep chart -may benefit from changing room away from noisy  - provide ear plugs for sound sensitivity   Recommendations for interactions with patient: - use interpreter or video interpreter if person is not present   Attendees:  Tolbert Matheson G. [place] M SLP

## 2019-09-24 NOTE — Progress Notes (Signed)
[facility] PHYSICAL MEDICINE & REHABILITATION PROGRESS NOTE   Subjective/Complaints: Patient seen laying in bed this morning.  He nods in the affirmative to all questions.  No reported issues overnight.  ROS: Limited due to cognition  Objective:   No results found. Recent Labs    09/24/19 0549  WBC 10.4  HGB 12.9*  HCT 39.4  PLT 696*   No results for input(s): NA, K, CL, CO2, GLUCOSE, BUN, CREATININE, CALCIUM in the last 72 hours.  Intake/Output Summary (Last 24 hours) at 09/24/2019 1325 Last data filed at 09/24/2019 0300 Gross per 24 hour  Intake 240 ml  Output 350 ml  Net -110 ml     Physical Exam: Vital Signs Blood pressure 138/77, pulse 72, temperature 98.1 F (36.7 C), resp. rate 16, height 5\' 8"  (1.727 m), weight 65.4 kg, SpO2 100 %. Constitutional: No distress . Vital signs reviewed. HENT: Normocephalic.  Crani site Eyes: EOMI. No discharge. Cardiovascular: No JVD. Respiratory: Normal effort.  No stridor. GI: Non-distended. Skin: Incisions c/d/i Psych: Limited assessment due to mentation Musc: No edema in extremities.  No tenderness in extremities. Neuro: Somnolent Motor, limited due to participation, but moving all extremities   Assessment/Plan: 1. 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  Physiatrist is providing close team supervision and 24 hour management of active medical problems listed below.  Physiatrist and rehab team continue to assess barriers to discharge/monitor patient progress toward functional and medical goals  Care Tool:  Bathing    Body parts bathed by patient: Right arm, Left arm, Chest, Abdomen, Front perineal area, Right upper leg, Left upper leg, Face   Body parts bathed by helper: Right lower leg, Left lower leg, Buttocks     Bathing assist Assist Level: Moderate Assistance - Patient 50 - 74%     Upper Body  Dressing/Undressing Upper body dressing Upper body dressing/undressing activity did not occur (including orthotics): N/A What is the patient wearing?: Pull over shirt    Upper body assist Assist Level: Minimal Assistance - Patient > 75%    Lower Body Dressing/Undressing Lower body dressing    Lower body dressing activity did not occur: N/A What is the patient wearing?: Incontinence brief     Lower body assist Assist for lower body dressing: Moderate Assistance - Patient 50 - 74%     Toileting Toileting    Toileting assist Assist for toileting: Moderate Assistance - Patient 50 - 74%     Transfers Chair/bed transfer  Transfers assist  Chair/bed transfer activity did not occur: N/A  Chair/bed transfer assist level: Minimal Assistance - Patient > 75%     Locomotion Ambulation   Ambulation assist      Assist level: Minimal Assistance - Patient > 75% Assistive device: Hand held assist Max distance: 50   Walk 10 feet activity   Assist     Assist level: Minimal Assistance - Patient > 75% Assistive device: Hand held assist   Walk 50 feet activity   Assist    Assist level: Minimal Assistance - Patient > 75% Assistive device: Hand held assist    Walk 150 feet activity   Assist Walk 150 feet activity did not occur: Safety/medical concerns         Walk 10 feet on uneven surface  activity   Assist Walk 10 feet on uneven surfaces activity did not occur: Safety/medical concerns  Wheelchair     Assist Will patient use wheelchair at discharge?: No (Per PT long term goals ) Type of Wheelchair: Manual    Wheelchair assist level: Moderate Assistance - Patient 50 - 74% Max wheelchair distance: 150    Wheelchair 50 feet with 2 turns activity    Assist        Assist Level: Moderate Assistance - Patient 50 - 74%   Wheelchair 150 feet activity     Assist      Assist Level: Moderate Assistance - Patient 50 - 74%   Blood  pressure 138/77, pulse 72, temperature 98.1 F (36.7 C), resp. rate 16, height 5\' 8"  (1.727 m), weight 65.4 kg, SpO2 100 %.  Medical Problem List and Plan: 1.Decreased functional mobility with altered mental statussecondary to traumatic bilateral SDH/SAH. Status post right frontotemporoparietal craniectomy evacuation of acute subdural hematoma and insertion of right frontal ventriculostomy insertion of craniotomy flap into the abdominal subcutaneous tissue 09/05/2019. Helmet when out of bed.  Continue CIR  -soft helmet when OOB 2. Antithrombotics: -DVT/anticoagulation:Lovenox 09/09/2019. Venous Doppler studies upper lower extremities negative except superficial cephalic vein thrombosis -antiplatelet therapy: N/A 3. Pain Management:Lidoderm patch,Robaxin 500 mg every 8 hours and oxycodone as needed  Appears controlled on 7/19 4. Mood:Provide emotional support -antipsychotic agents: N/A - cont sleep wake chart 5. Neuropsych: This patientis notcapable of making decisions on hisown behalf. 6. Skin/Wound Care:Routine skin checks.  -local care to abdomen and scalp 7. Fluids/Electrolytes/Nutrition:encourage PO 8. L1/2 right transverse process fractures. Conservative care 9. Urinary retention.     intermittently req I/O cath  -Continue Urecholine, decreased to 10 3 times daily on 7/19  Had been fairly continent, however increasing incontinent episodes overnight  -Recent urinalysis negative 10. Dysphagia. Dysphagia #3 pain liquids. Advance diet as tolerated. 11. Leukocytosis: Resolved 12. Thrombocytosis:   Platelets 696 on 7/19, improving  LOS: 4 days A FACE TO FACE EVALUATION WAS PERFORMED  Jason Skinner Jason Skinner 09/24/2019, 1:25 PM

## 2019-09-25 ENCOUNTER — Inpatient Hospital Stay (HOSPITAL_COMMUNITY): Payer: Self-pay

## 2019-09-25 ENCOUNTER — Inpatient Hospital Stay (HOSPITAL_COMMUNITY): Payer: Self-pay | Admitting: Speech Pathology

## 2019-09-25 LAB — URINALYSIS, COMPLETE (UACMP) WITH MICROSCOPIC
Bacteria, UA: NONE SEEN
Bilirubin Urine: NEGATIVE
Glucose, UA: NEGATIVE mg/dL
Hgb urine dipstick: NEGATIVE
Ketones, ur: NEGATIVE mg/dL
Leukocytes,Ua: NEGATIVE
Nitrite: NEGATIVE
Protein, ur: NEGATIVE mg/dL
Specific Gravity, Urine: 1.021 (ref 1.005–1.030)
pH: 6 (ref 5.0–8.0)

## 2019-09-25 NOTE — Progress Notes (Signed)
Speech Language Pathology Daily Session Note  Patient Details  Name: Jason Skinner MRN: [id number] Date of Birth: 08/04/1990  Today's Date: 09/25/2019 SLP Individual Time: 2500-3704 SLP Individual Time Calculation (min): 55 min  Short Term Goals: Week 1: SLP Short Term Goal 1 (Week 1): Pt will consume dysphagia 3 solid texture diet and thin liquids with minimal overt s/sx aspiration and Supervision A verbal cues for use of strategies to clear oral residue. SLP Short Term Goal 2 (Week 1): Pt will demonstrate ability to use compensatory memory strategies to recall new and/or daily information with Min A verbal/visual cues. SLP Short Term Goal 3 (Week 1): Pt will verbally identify 1 physical and 1 cognitive impairment with Mod A verbal/visual cues. SLP Short Term Goal 4 (Week 1): Pt will demonstrate ability to problem solve functional situations with Min A verbal/visual cues. SLP Short Term Goal 5 (Week 1): Pt will selectively attend to functional tasks with Min A verbal/visual cues.  Skilled Therapeutic Interventions: Skilled treatment session focused on cognitive goals. Upon arrival, patient was in bed without the C-collar. SLP provided education to the patient and his brother-in-law in regards to needing to wear it during SLP facilitated session by providing Min A verbal cues for safety with transfer from the bed to the wheelchair. Patient participated in a novel card task with overall supervision level verbal cues needed for mildly complex problem solving throughout task. 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 Patient was transferred back to bed at end of session. Patient left supine in bed with alarm on and all needs within reach. Continue with current plan of care.      Pain Pain Assessment Pain Scale: Faces Pain Score: 5/10 Interventions: pain medications   Therapy/Group: Individual Therapy  Herberth Deharo 09/25/2019,  10:10 AM

## 2019-09-25 NOTE — Progress Notes (Signed)
Jason Skinner PHYSICAL MEDICINE & REHABILITATION PROGRESS NOTE   Subjective/Complaints: Pt c/o ongoing neck and occipital skull pain. Doesn't like c-collar   ROS: no problems breathing, good appetite. Moving bowels and bladder  Objective:   No results found. Recent Labs    09/24/19 0549  WBC 10.4  HGB 12.9*  HCT 39.4  PLT 696*   No results for input(s): NA, K, CL, CO2, GLUCOSE, BUN, CREATININE, CALCIUM in the last 72 hours.  Intake/Output Summary (Last 24 hours) at 09/25/2019 1056 Last data filed at 09/25/2019 0800 Gross per 24 hour  Intake 640 ml  Output 1100 ml  Net -460 ml     Physical Exam: Vital Signs Blood pressure 93/61, pulse 76, temperature 98.3 F (36.8 C), resp. rate 16, height 5\' 8"  (1.727 m), weight 65.4 kg, SpO2 98 %. Constitutional: No distress . Vital signs reviewed. HEENT: EOMI, oral membranes moist Neck: supple Cardiovascular: RRR without murmur. No JVD    Respiratory/Chest: CTA Bilaterally without wheezes or rales. Normal effort    GI/Abdomen: BS +, non-tender, non-distended Ext: no clubbing, cyanosis, or edema Psych: pleasant and cooperative Skin: incisions cdi Musc: No edema in extremities.  No tenderness in extremities. Is tender along upper cervical spine and occiput. No fluctuance or discoloration.  Neuro: Somnolent Motor, limited due to participation, but moving all extremities   Assessment/Plan: 1. 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  Physiatrist is providing close team supervision and 24 hour management of active medical problems listed below.  Physiatrist and rehab team continue to assess barriers to discharge/monitor patient progress toward functional and medical goals  Care Tool:  Bathing    Body parts bathed by patient: Right arm, Left arm, Chest, Abdomen, Front perineal area, Right upper leg, Left upper leg, Face    Body parts bathed by helper: Right lower leg, Left lower leg, Buttocks     Bathing assist Assist Level: Moderate Assistance - Patient 50 - 74%     Upper Body Dressing/Undressing Upper body dressing Upper body dressing/undressing activity did not occur (including orthotics): N/A What is the patient wearing?: Pull over shirt    Upper body assist Assist Level: Minimal Assistance - Patient > 75%    Lower Body Dressing/Undressing Lower body dressing    Lower body dressing activity did not occur: N/A What is the patient wearing?: Incontinence brief     Lower body assist Assist for lower body dressing: Moderate Assistance - Patient 50 - 74%     Toileting Toileting    Toileting assist Assist for toileting: Moderate Assistance - Patient 50 - 74%     Transfers Chair/bed transfer  Transfers assist  Chair/bed transfer activity did not occur: N/A  Chair/bed transfer assist level: Minimal Assistance - Patient > 75%     Locomotion Ambulation   Ambulation assist      Assist level: Minimal Assistance - Patient > 75% Assistive device: Hand held assist Max distance: 50   Walk 10 feet activity   Assist     Assist level: Minimal Assistance - Patient > 75% Assistive device: Hand held assist   Walk 50 feet activity   Assist    Assist level: Minimal Assistance - Patient > 75% Assistive device: Hand held assist    Walk 150 feet activity   Assist Walk 150 feet activity did not occur: Safety/medical concerns         Walk 10 feet on  uneven surface  activity   Assist Walk 10 feet on uneven surfaces activity did not occur: Safety/medical concerns         Wheelchair     Assist Will patient use wheelchair at discharge?: No (Per PT long term goals ) Type of Wheelchair: Manual    Wheelchair assist level: Moderate Assistance - Patient 50 - 74% Max wheelchair distance: 150    Wheelchair 50 feet with 2 turns activity    Assist        Assist  Level: Moderate Assistance - Patient 50 - 74%   Wheelchair 150 feet activity     Assist      Assist Level: Moderate Assistance - Patient 50 - 74%   Blood pressure 93/61, pulse 76, temperature 98.3 F (36.8 C), resp. rate 16, height 5\' 8"  (1.727 m), weight 65.4 kg, SpO2 98 %.  Medical Problem List and Plan: 1.Decreased functional mobility with altered mental statussecondary to traumatic bilateral SDH/SAH. Status post right frontotemporoparietal craniectomy evacuation of acute subdural hematoma and insertion of right frontal ventriculostomy insertion of craniotomy flap into the abdominal subcutaneous tissue 09/05/2019. Helmet when out of bed.  Continue CIR  -soft helmet when OOB  -will dc c-collar 2. Antithrombotics: -DVT/anticoagulation:Lovenox 09/09/2019. Venous Doppler studies upper lower extremities negative except superficial cephalic vein thrombosis -antiplatelet therapy: N/A 3. Pain Management:Lidoderm patch,Robaxin 500 mg every 8 hours and oxycodone as needed  -will add ice/heat to occipital area 4. Mood:Provide emotional support -antipsychotic agents: N/A - cont sleep wake chart 5. Neuropsych: This patientis notcapable of making decisions on hisown behalf. 6. Skin/Wound Care:Routine skin checks.  -local care to abdomen and scalp 7. Fluids/Electrolytes/Nutrition:encourage PO 8. L1/2 right transverse process fractures. Conservative care 9. Urinary retention.     intermittently req I/O cath  -Continue Urecholine, decreased to 10 3 times daily on 7/19  Had been fairly continent, however increasing incontinent episodes overnight  -7/20Recent urinalysis negative but now has low grade temp.    -will re-check ua, ucx 10. Dysphagia. Dysphagia #3 pain liquids. tolerating well. 11. Leukocytosis: Resolved 12. Thrombocytosis:   Platelets 696 on 7/19, improving  LOS: 5 days A FACE TO FACE EVALUATION WAS  PERFORMED  Meredith Staggers 09/25/2019, 10:56 AM

## 2019-09-25 NOTE — Patient Care Conference (Signed)
Inpatient RehabilitationTeam Conference and Plan of Care Update Date: 09/25/2019   Time: 1:32 PM    Patient Name: Jason Skinner      Medical Record Number: [id number]  Date of Birth: 1990/10/16 Sex: Male         Room/Bed: 4W10C/4W10C-01 Payor Info: Payor: MEDICAID PENDING / Plan: MEDICAID PENDING / Product Type: *No Product type* /    Admit Date/Time:  09/20/2019  2:33 PM  Primary Diagnosis:  Traumatic subdural hematoma [facility])  Hospital Problems: Principal Problem:   Traumatic subdural hematoma (HCC) Active Problems:   Thrombocytosis ([place])   Urinary retention   Postoperative pain   Urinary incontinence   Dysphagia    Expected Discharge Date: Expected Discharge Date: 10/05/19  Team Members Present: Physician leading conference: Dr. Alger Simons Care Coodinator Present: Loralee Pacas, LCSWA;Keevan Wolz Creig Hines, RN, BSN, [place] Nurse Present: Rayne Du, LPN PT Present: Apolinar Junes, PT OT Present: Laverle Hobby, OT SLP Present: Weston Anna, SLP PPS Coordinator present : Gunnar Fusi, SLP     Current Status/Progress Goal Weekly Team Focus  Bowel/Bladder   continent of B&B   Will remain continent, toileting   Will assess every shift and PRN   Swallow/Nutrition/ Hydration   Dys. 3 textures with thin liquids, supervision  Mod I  possible trials of regular textures, use of swallowing compensatory strategies   ADL's   Min A ADLs overall, Min A ADL transfers, diplopia, L inattention, poor safety awareness  Supervision, min A cognitive goals  Safety awareness/self awareness, vision, ADL retraining, ADL transfers   Mobility   Min A overall, gait >200 ft HHA  Supervision overall  Safety awareness, recall of precautions, activity tolerance, balance, gait and stair training, communtiy mobility, patient/caregiver education, d/c planning   Communication             Safety/Cognition/ Behavioral Observations  Min-Mod A  Supervision  initiation, problem solving, attention,  recall and awareness   Pain   C/O pain (head)10/10  pain <5  Assess every shift and PRN   Skin   Surgical incision abd and head  Rapid wound healing, timely dressing chang,   Assess every shift and PRN     Team Discussion:  Discharge Planning/Teaching Needs:  Pt to have 24/7 care between brother and uncle who will alternate their schedules  Family education as recommended by therapy   Current Update:   Wife not able to come from [place], [place] has expired.  Current Barriers to Discharge:  Inaccessible home environment, Decreased caregiver support, Incontinence and Lack of/limited family support  Possible Resolutions to Barriers: Increase family education on safety and awareness, increase family education on importance of safety and 24/7 supervision. Time toilet the patient Q 2 hrs.  Patient on target to meet rehab goals: yes, Patient has incontinent episodes, refusing neck brace at times d/t pain, MD aware. Patient has poor safety awareness, family needs lots of education on safety and brain injury in general. Patient has gait imbalance and scissoring. Patient has photophobia, given sunglasses to dim bright lighting.   *See Care Plan and progress notes for long and short-term goals.   Revisions to Treatment Plan:  Continue with family education.    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 l1,l2 TVP fxs, headache, cervical pain. cognitively showing progress Weekly Focus/Goal: pain mgt, wound care, nutrition, day/night regulation  Barriers to Discharge: Medical stability   Possible Resolutions to Barriers: regular labs, wound care, med mgt   Continued Need for Acute Rehabilitation Level of Care: The  patient requires daily medical management by a physician with specialized training in physical medicine and rehabilitation for the following reasons: Direction of a multidisciplinary physical rehabilitation program to maximize functional independence : Yes Medical management of  patient stability for increased activity during participation in an intensive rehabilitation regime.: Yes Analysis of laboratory values and/or radiology reports with any subsequent need for medication adjustment and/or medical intervention. : Yes   I attest that I was present, lead the team conference, and concur with the assessment and plan of the team.   Dorthula Nettles G 09/25/2019, 1:32 PM

## 2019-09-25 NOTE — Progress Notes (Signed)
Patient ID: Lemario Lugo, male   DOB: 06/12/1990, 29 y.o.   MRN: [id number]  Sw met with pt in room at bedside to provide updates from team conference, and d/c date 7/30. Pt cousin Moses present. SW utilized [employer] Interpreter Gabby ID#[id number]. During visit, there appeared to be discrepancies related to whether or not pt has worker's comp. SW informed pt does not have worker's comp, and encouraged follow-up with their uncle Juan. SW discussed pt will required 24/7 care at discharge due to cognition and safety. Pt cousin was concerned because he said pt will not have 24/7 care, and was adamant about pt having worker's comp to have his care needs met (I.e. post rehab). Pt cousin Moses to discuss further with Uncle. SW informed there will continue to be updates on pt care needs.    , MSW, LCSWA Office: [phone] Cell: [phone] Fax: [phone] 

## 2019-09-25 NOTE — Progress Notes (Signed)
Occupational Therapy Session Note  Patient Details  Name: Jason Skinner MRN: [id number] Date of Birth: October 16, 1990  Today's Date: 09/25/2019 OT Individual Time: 1415-1530 OT Individual Time Calculation (min): 75 min    Short Term Goals: Week 1:  OT Short Term Goal 1 (Week 1): Pt will transfer to toilet with CGA and LRAD OT Short Term Goal 2 (Week 1): Pt will thread BLE into pants OT Short Term Goal 3 (Week 1): Pt will complete UB dressing with S OT Short Term Goal 4 (Week 1): Pt will groom standing at sink to demo improved endurance  Skilled Therapeutic Interventions/Progress Updates:    Pt received supine with family member and interpretor present. Pt agreeable to take shower. Clarified MD's c-collar instructions- for comfort. Pt elected to not wear. Pt completed bed mobility to EOB with (S) and donned helmet. Pt transferred into the bathroom with min A using the RW. Pt required cueing for sequencing of steps to undress for maximal safety. Clean urine sample collected in urinal for UA and RN alerted. Pt completed shower with care taken to not get head wet. Pt washed UB and LB with CGA required during standing peri washing. Pt transferred to EOB with min HHA. Pt donned shirt and pants with min A. Pt, interpretor, and family member all came down to the tub room for AE discussion. Pt able to complete 200 ft of functional mobility with HHA, requiring one seated rest break. Pt still c/o diplopia and photosensitivity, thus this clinician occluded the R lens on his sunglasses with pt reporting immediate resolution of diplopia. Reviewed OT recommendation for pt to use TTB and potential use of suction cup grab bars. Pt returned to his room and laid supine. Pt's family member had several questions re equipment and d/c and all were answered with the help of the interpretor Richard. Pt was left supine with all needs met, bed alarm set.   Therapy Documentation Precautions:  Precautions Precautions:  Fall Precaution Comments: bone flap in abdomen, helmet when OOB Required Braces or Orthoses: Cervical Brace, Other Brace Cervical Brace: Hard collar, At all times Other Brace: helmet for OOB  Restrictions Weight Bearing Restrictions: No   Therapy/Group: Individual Therapy  Curtis Sites 09/25/2019, 6:41 AM

## 2019-09-25 NOTE — Progress Notes (Signed)
Physical Therapy Session Note  Patient Details  Name: Jason Skinner MRN: [id number] Date of Birth: [birth year]  Today's Date: 09/25/2019 PT Individual Time: 1103-1200 PT Individual Time Calculation (min): 57 min   Short Term Goals: Week 1:  PT Short Term Goal 1 (Week 1): Pt will perform bed mobility with supervision assist PT Short Term Goal 2 (Week 1): Pt will transfer to and from Bed with CGA and LRAD PT Short Term Goal 3 (Week 1): Pt will ambulate 151ft with CGA and LRAD PT Short Term Goal 4 (Week 1): Pt will demonstrate awareness of R side 50% of the time with min cues Week 2:    Week 3:     Skilled Therapeutic Interventions/Progress Updates:   am session - interpreter present for full session  PAIN Headache, 5/10  Rest breaks as needed, treatment to tolerance  Pt supine to sit w/cues and cga.  Helmet and mask donned w/min assist, cues for dual hand use. STS and spt w/min assist for balance.  Pt transported to gym for continued session  STS w/cga, turn to mat and assumes tall kneeling w/min assist.  Worked on statitic tall kneeling - pt w/mild to moderate increased sway, cga, perfomed x 1-1.48min 7 sets. Rests in sitting Sit to stand/turn to mat, stand to quadraped w/min assist.   In quadruped pt performs alternating arm raises, alternating leg raises x 5 each.  This activity altnernated w/rest in sitting and the following dual UE/balance task:  Standing bouncing ball w/wide base then w/normal base x 1 min, 2 min w/min assist for balance. Pt c/o headach and rested in supine several times as well.  Sit to/from supine on mat w/supervision, cues.  Gait 157ft w/min assist to mod assist, balance loss varies from L to R, cadence exceeds safe limits for current stability/balance. Turn/sit to bed w/min assist.  Sit to supine w/supervision. Pt left supine w/rails up x 4, alarm set, bed in lowest position, and needs in reach.  Therapy Documentation Precautions:  Precautions Precautions:  Fall Precaution Comments: bone flap in abdomen, helmet when OOB Required Braces or Orthoses: Cervical Brace, Other Brace Cervical Brace: Hard collar, At all times Other Brace: helmet for OOB  Restrictions Weight Bearing Restrictions: No    Therapy/Group: Individual Therapy  Callie Fielding, Tony 09/25/2019, 12:21 PM

## 2019-09-26 ENCOUNTER — Inpatient Hospital Stay (HOSPITAL_COMMUNITY): Payer: Self-pay

## 2019-09-26 LAB — URINE CULTURE: Culture: <10000 — AB

## 2019-09-26 NOTE — Progress Notes (Signed)
Physical Therapy Session Note  Patient Details  Name: Jason Skinner MRN: [id number] Date of Birth: 07/01/1990  Today's Date: 09/26/2019 PT Individual Time: 3748-2707 PT Individual Time Calculation: 38 min  Short Term Goals: Week 1:  PT Short Term Goal 1 (Week 1): Pt will perform bed mobility with supervision assist PT Short Term Goal 2 (Week 1): Pt will transfer to and from Bed with CGA and LRAD PT Short Term Goal 3 (Week 1): Pt will ambulate 145ft with CGA and LRAD PT Short Term Goal 4 (Week 1): Pt will demonstrate awareness of R side 50% of the time with min cues  Skilled Therapeutic Interventions/Progress Updates:     Patient in bed asleep with his cousin in the room upon PT arrival. Patient easily aroused to verbal and tactile stimulation and agreeable to PT session. Live interpreter present throughout session. Patient reported 5/10 headache during session, RN made aware and reported providing patient with pain medication prior to session. PT provided repositioning, rest breaks, and distraction as pain interventions throughout session.   Patient sat EOB with supervision with HOB at 30 degrees. Immediately reported increased throbbing headache and dizziness in sitting and requesting to lie back down. Obtained dynamap to take orthostatic vitals.  Lying: BP 109/72, HR 73 Sitting: BP 104/71, HR 96 (increased throbbing headache and dizziness that improved, but did not resolve after 3 min in sitting Standing: BP 117/72, HR 116 (no change in headache or dizziness, but both still present in standing x2 min)  Patient requesting to lie down again due to headache and fatigue. Reports sleeping well last night, but insisting that he needs to sleep now. Patient declined any further mobility at this time, however, was agreeable to education. Provided education on general BI symptoms and recovery, informing him and his cousin that recovery will take time and participation in therapy. Informed patient that  MD would be informed about his dizziness and headache and that the team will work together to improve these symptoms to increase participation with therapy. Patient appreciative. Also educated on POC, visual and vestibular deficits and general treatment, PT goals, 24/7 supervision at d/c, and follow-up care (therapy, vision, and medical/surgical). Patient's cousin reports that patient's family plans to rent a house that is centrally located to allow family to assist with patient's care and provide 24/7 supervision at d/c. Educated on ideal home set up to reduce risk of falls and improve safe mobility at home. Patient asleep at end of discussion. Patient missed 22 min of skilled PT due to fatigue/headache, RN made aware. Will attempt to make-up missed time as able.    Patient in bed with his cousin at end of session with breaks locked, bed alarm set, and all needs within reach.    Therapy Documentation Precautions:  Precautions Precautions: Fall Precaution Comments: bone flap in abdomen, helmet when OOB Required Braces or Orthoses: Cervical Brace, Other Brace Cervical Brace: Hard collar, At all times Other Brace: helmet for OOB  Restrictions Weight Bearing Restrictions: No    Therapy/Group: Individual Therapy  Kaelyn Nauta L Ramatoulaye Pack PT, DPT  09/26/2019, 6:55 PM

## 2019-09-26 NOTE — Progress Notes (Signed)
Occupational Therapy Session Note  Patient Details  Name: Jason Skinner MRN: [id number] Date of Birth: May 22, 1990  Today's Date: 09/26/2019 OT Individual Time: 0900-1000 OT Individual Time Calculation (min): 60 min    Short Term Goals: Week 1:  OT Short Term Goal 1 (Week 1): Pt will transfer to toilet with CGA and LRAD OT Short Term Goal 2 (Week 1): Pt will thread BLE into pants OT Short Term Goal 3 (Week 1): Pt will complete UB dressing with S OT Short Term Goal 4 (Week 1): Pt will groom standing at sink to demo improved endurance  Skilled Therapeutic Interventions/Progress Updates:    1:1. Pt received in bed agreeable to OT with 5/10 HA and pt declines bathing as he showered yesterday afternoon. Pt changes clothing with set up for shirt and A to thread RLE into pants. Pt sit to stand at EOB with CGA and no AD to pull pants past hips. Pt grooms at sink standing with supervision. Pt reporting slight dizziness in standing and BP assessed and all positions within functional limits in seated and standing. Pt transported to tx gym with total A for time management. Pt completes seated EOM activity with pipe tree activity 3x (first trial, seated, second trial-seated alternating with second activity, third trial- standing/ambulating between 2 tasks). Pt requires min question cues throughout trial to maintain alternating patter and subtle cues to notice mistakes/respond to mistakes. During alternating tasks pt requires increased cueing to maintain safe RW management and pt able to complete all tasks without cues for sequencing/order. Exited session with pt sated in bed, exit alarm on and call light in reach. Interpreter present throughout session.   Therapy Documentation Precautions:  Precautions Precautions: Fall Precaution Comments: bone flap in abdomen, helmet when OOB Required Braces or Orthoses: Cervical Brace, Other Brace Cervical Brace: Hard collar, At all times Other Brace: helmet for OOB   Restrictions Weight Bearing Restrictions: No General:   Vital Signs:   Pain: Pain Assessment Pain Score: Asleep ADL: ADL Upper Body Bathing: Minimal cueing, Supervision/safety Where Assessed-Upper Body Bathing: Sitting at sink Lower Body Bathing: Maximal assistance Where Assessed-Lower Body Bathing: Sitting at sink, Standing at sink Upper Body Dressing: Minimal assistance Where Assessed-Upper Body Dressing: Sitting at sink Lower Body Dressing: Maximal assistance Where Assessed-Lower Body Dressing: Sitting at sink, Standing at sink Toileting: Minimal assistance Where Assessed-Toileting: Glass blower/designer: Psychiatric nurse Method: Stand pivot Vision   Perception    Praxis   Exercises:   Other Treatments:     Therapy/Group: Individual Therapy  Tonny Branch 09/26/2019, 10:09 AM

## 2019-09-26 NOTE — Progress Notes (Signed)
[facility] PHYSICAL MEDICINE & REHABILITATION PROGRESS NOTE   Subjective/Complaints: Occipital/neck pain. Otherwise no new issues.   ROS: Limited due to cognitive/behavioral   Objective:   No results found. Recent Labs    09/24/19 0549  WBC 10.4  HGB 12.9*  HCT 39.4  PLT 696*   No results for input(s): NA, K, CL, CO2, GLUCOSE, BUN, CREATININE, CALCIUM in the last 72 hours.  Intake/Output Summary (Last 24 hours) at 09/26/2019 0836 Last data filed at 09/26/2019 0400 Gross per 24 hour  Intake 676 ml  Output 1050 ml  Net -374 ml     Physical Exam: Vital Signs Blood pressure 109/69, pulse 68, temperature 98.2 F (36.8 C), resp. rate 18, height 5\' 8"  (1.727 m), weight 65.4 kg, SpO2 100 %. Constitutional: No distress . Vital signs reviewed. HEENT: EOMI, oral membranes moist Neck: supple Cardiovascular: RRR without murmur. No JVD    Respiratory/Chest: CTA Bilaterally without wheezes or rales. Normal effort    GI/Abdomen: BS +, non-tender, non-distended Ext: no clubbing, cyanosis, or edema Psych: pleasant and cooperative Skin: CDI Musc: No edema in extremities.  No tenderness in extremities. Is tender along upper cervical spine and occiput. No fluctuance or discoloration.  Neuro: Somnolent Motor, limited due to participation, but moving all extremities   Assessment/Plan: 1. 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  Physiatrist is providing close team supervision and 24 hour management of active medical problems listed below.  Physiatrist and rehab team continue to assess barriers to discharge/monitor patient progress toward functional and medical goals  Care Tool:  Bathing    Body parts bathed by patient: Right arm, Left arm, Chest, Abdomen, Front perineal area, Right upper leg, Left upper leg, Face, Buttocks   Body parts bathed by helper: Right lower leg,  Left lower leg, Buttocks     Bathing assist Assist Level: Contact Guard/Touching assist     Upper Body Dressing/Undressing Upper body dressing Upper body dressing/undressing activity did not occur (including orthotics): N/A What is the patient wearing?: Pull over shirt    Upper body assist Assist Level: Minimal Assistance - Patient > 75%    Lower Body Dressing/Undressing Lower body dressing    Lower body dressing activity did not occur: N/A What is the patient wearing?: Pants, Incontinence brief     Lower body assist Assist for lower body dressing: Minimal Assistance - Patient > 75%     Toileting Toileting    Toileting assist Assist for toileting: Minimal Assistance - Patient > 75%     Transfers Chair/bed transfer  Transfers assist  Chair/bed transfer activity did not occur: N/A  Chair/bed transfer assist level: Minimal Assistance - Patient > 75%     Locomotion Ambulation   Ambulation assist      Assist level: Moderate Assistance - Patient 50 - 74% Assistive device: No Device Max distance: 120   Walk 10 feet activity   Assist     Assist level: Minimal Assistance - Patient > 75% Assistive device: No Device   Walk 50 feet activity   Assist    Assist level: Minimal Assistance - Patient > 75% Assistive device: No Device    Walk 150 feet activity   Assist Walk 150 feet activity did not occur: Safety/medical concerns         Walk 10 feet on uneven surface  activity   Assist Walk 10 feet on uneven surfaces activity did not  occur: Safety/medical concerns         Wheelchair     Assist Will patient use wheelchair at discharge?: No (Per PT long term goals ) Type of Wheelchair: Manual    Wheelchair assist level: Moderate Assistance - Patient 50 - 74% Max wheelchair distance: 150    Wheelchair 50 feet with 2 turns activity    Assist        Assist Level: Moderate Assistance - Patient 50 - 74%   Wheelchair 150 feet  activity     Assist      Assist Level: Moderate Assistance - Patient 50 - 74%   Blood pressure 109/69, pulse 68, temperature 98.2 F (36.8 C), resp. rate 18, height 5\' 8"  (1.727 m), weight 65.4 kg, SpO2 100 %.  Medical Problem List and Plan: 1.Decreased functional mobility with altered mental statussecondary to traumatic bilateral SDH/SAH. Status post right frontotemporoparietal craniectomy evacuation of acute subdural hematoma and insertion of right frontal ventriculostomy insertion of craniotomy flap into the abdominal subcutaneous tissue 09/05/2019. Helmet when out of bed.  Continue CIR  -soft helmet when OOB  -dc'ed c-collar 2. Antithrombotics: -DVT/anticoagulation:Lovenox 09/09/2019. Venous Doppler studies upper lower extremities negative except superficial cephalic vein thrombosis -antiplatelet therapy: N/A 3. Pain Management:Lidoderm patch,Robaxin 500 mg every 8 hours and oxycodone as needed  -will add ice/heat to occipital area 4. Mood:Provide emotional support -antipsychotic agents: N/A - cont sleep wake chart 5. Neuropsych: This patientis notcapable of making decisions on hisown behalf. 6. Skin/Wound Care:Routine skin checks.  -local care to abdomen and scalp 7. Fluids/Electrolytes/Nutrition:encourage PO 8. L1/2 right transverse process fractures. Conservative care 9. Urinary retention.     intermittently req I/O cath  -Continue Urecholine, decreased to 10 3 times daily on 7/19  Had been fairly continent, however increasing incontinent episodes overnight  -7/20Recent urinalysis negative but now has low grade temp.   -7/21 ua negative, cx pending 10. Dysphagia. Dysphagia #3 pain liquids. tolerating well. 11. Leukocytosis: Resolved 12. Thrombocytosis:   Platelets 696 on 7/19, improving  LOS: 6 days A FACE TO FACE EVALUATION WAS PERFORMED  Meredith Staggers 09/26/2019, 8:36 AM

## 2019-09-26 NOTE — Progress Notes (Signed)
Occupational Therapy Session Note  Patient Details  Name: Jason Skinner MRN: [id number] Date of Birth: 17-Apr-1990  Today's Date: 09/26/2019 OT Individual Time: 1420-1530 OT Individual Time Calculation (min): 70 min    Short Term Goals: Week 1:  OT Short Term Goal 1 (Week 1): Pt will transfer to toilet with CGA and LRAD OT Short Term Goal 2 (Week 1): Pt will thread BLE into pants OT Short Term Goal 3 (Week 1): Pt will complete UB dressing with S OT Short Term Goal 4 (Week 1): Pt will groom standing at sink to demo improved endurance  Skilled Therapeutic Interventions/Progress Updates:    Pt received supine with HA 5/10. RN entering room to administer medication during session for pain relief. Interpretor present throughout session. Min questioning cues to remember to don helmet. Pt completed ambulatory transfer into the bathroom with min HHA, scissoring and L inattention, cueing provided. Pt with good carryover from edu yesterday re transferring into shower and sequencing undressing for maximal safety. Pt washed UB and LB with CGA for standing. Pt donned shirt with min A to pull overhead and CGA to don pants. Min A to fix socks once donned. Pt able to complete oral care with min cues for termination and realizing toothpaste had fallen off toothbrush. Pt completed 100 ft of functional mobility with min HHA to the therapy gym. Less scissoring observed with pt wearing occluded sunglasses. Pt completed line bisection activity on a standing mirror, two trials, one with and one without his occluded sunglasses. Pt required min cueing to attend to L upper quadrant of mirror. Pt less accurate when R eye was not occluded and pt frequently closing eye, self-occluding. Pt then sat at raised table and completed peg tasks for visual motor skills and working to short term memory- as activity was graded by taking away model. Mod cueing overall required during visual memory task. Pt returned to his room and was left  supine with all needs met. Bed alarm set.   Therapy Documentation Precautions:  Precautions Precautions: Fall Precaution Comments: bone flap in abdomen, helmet when OOB Required Braces or Orthoses: Cervical Brace, Other Brace Cervical Brace: Hard collar, At all times Other Brace: helmet for OOB  Restrictions Weight Bearing Restrictions: No   Therapy/Group: Individual Therapy  Curtis Sites 09/26/2019, 7:28 AM

## 2019-09-27 ENCOUNTER — Inpatient Hospital Stay (HOSPITAL_COMMUNITY): Payer: Self-pay

## 2019-09-27 ENCOUNTER — Inpatient Hospital Stay (HOSPITAL_COMMUNITY): Payer: Self-pay | Admitting: Speech Pathology

## 2019-09-27 MED ORDER — TOPIRAMATE 25 MG PO TABS
25.0000 mg | ORAL_TABLET | Freq: Two times a day (BID) | ORAL | Status: DC
  Administered 2019-09-27 – 2019-10-01 (×9): 25 mg via ORAL
  Filled 2019-09-27 (×10): qty 1

## 2019-09-27 NOTE — Progress Notes (Signed)
Physical Therapy Weekly Progress Note  Patient Details  Name: Jason Skinner MRN: [id number] Date of Birth: Jan 27, 1991  Beginning of progress report period: September 21, 2019 End of progress report period: September 27, 2019  Today's Date: 09/27/2019 PT Individual Time: 0932-6712 PT Individual Time Calculation (min): 53 min   Patient has met 3 of 4 short term goals.  He has made variable progress with mobility, limited by headache and dizziness during morning sessions and decreased participation due to fatigue. Currently requires supervision for bed mobility, CGA for transfers, min-mod A gait >250 ft with and without RW due to scissoring gait with R inattention and poor proprioception on R.   Patient continues to demonstrate the following deficits muscle weakness, decreased cardiorespiratoy endurance, ataxia, decreased coordination and decreased motor planning, decreased visual acuity and decreased visual perceptual skills, decreased attention to right, decreased attention, decreased awareness, decreased problem solving, decreased safety awareness and decreased memory, central origin and decreased sitting balance, decreased standing balance, decreased postural control and decreased balance strategies and therefore will continue to benefit from skilled PT intervention to increase functional independence with mobility.  Patient progressing toward long term goals..  Continue plan of care.  PT Short Term Goals Week 1:  PT Short Term Goal 1 (Week 1): Pt will perform bed mobility with supervision assist PT Short Term Goal 1 - Progress (Week 1): Met PT Short Term Goal 2 (Week 1): Pt will transfer to and from Bed with CGA and LRAD PT Short Term Goal 2 - Progress (Week 1): Met PT Short Term Goal 3 (Week 1): Pt will ambulate 166f with CGA and LRAD PT Short Term Goal 3 - Progress (Week 1): Progressing toward goal PT Short Term Goal 4 (Week 1): Pt will demonstrate awareness of R side 50% of the time with min  cues PT Short Term Goal 4 - Progress (Week 1): Met Week 2:  PT Short Term Goal 1 (Week 2): STG=LTG due to ELOS  Skilled Therapeutic Interventions/Progress Updates:     Patient in bed with his brother in the room upon PT arrival. Interpreter arrived 2 min into session and remained throughout session. Patient initially refused session due to headache and fatigue. Provided encouragement for improved symptoms with mobility. Patient agreeable to gait training and working in a dark, quiet room during session. Patient reported 5/10 headache during session, RN premedicated patient prior to session. PT provided repositioning, rest breaks, and distraction as pain interventions throughout session. Donned soft helmet throughout session.   Patient demonstrated poor incite into his deficits and R side dysmetria and decreased R attention throughout session. Reports intermittent dizziness throughout session with unclear stimulus. Reported in standing, in lying at rest, and while ambulating x1. Some improvement with gaze stabilization with visual target.   Therapeutic Activity: Bed Mobility: Patient performed supine to/from sit with supervision with HOB elevated and on mat table with multiple pillows for elevation. Provided verbal cues for safety awareness to reduce fall risk lying close to EOB/EOM. Transfers: Patient performed sit to/from stand x2 using a RW and x4 without RW with CGA. Provided verbal cues for safety awareness and hand placement on RW.  Gait Training:  Patient ambulated >250 feet x1 using RW with min A and x1 with HHA with min progressing to mod A with fatigue. Ambulated with scissoring gait (increased with fatigue) veering to the R with poor control of RW, variable foot placement R>L, and poor postural control throughout. Provided verbal cues for increased BOS, steering in a straight  line with visual cues, and decreased gait speed for improved consistency in stepping and stability in SLS. Patient  unable to recall directions to return back to his room.   Neuromuscular Re-ed: Patient performed the following reaction time activities in sitting due to fatigue: -Dynavision bottom R with R UE x1 min: 4.2 sec -Dynavision all bottom with R UE x1 min: LLQ: 2.7 sec, LRQ: 4.2 sec -Dynavision all bottom with L UE x1 min : LLQ: 2.2 sec, LRQ 2.4 sec -Step taps to small cones x1 with mod A due to LOB, patient reported increased fatigue and asked to return to the room after first trial  Provided lying rest breaks between trials due to patient frequently requesting to lie down during session due to headache and fatigue. Applied moleskin to glasses during session to reduce increased patient's headache while wearing them.   Patient in bed with his brother and the interpreter in the room at end of session with breaks locked, bed alarm set, and all needs within reach. Patient missed 7 min of skilled PT due to pain/fatigue, RN made aware. Will attempt to make-up missed time as able.    Therapy Documentation Precautions:  Precautions Precautions: Fall Precaution Comments: bone flap in abdomen, helmet when OOB Required Braces or Orthoses: Cervical Brace, Other Brace Cervical Brace: Hard collar, At all times Other Brace: helmet for OOB  Restrictions Weight Bearing Restrictions: No General: PT Amount of Missed Time (min): 7 Minutes PT Missed Treatment Reason: Patient fatigue   Therapy/Group: Individual Therapy  Jason Skinner L Fayelynn Distel PT, DPT  09/27/2019, 5:42 PM

## 2019-09-27 NOTE — Progress Notes (Addendum)
[place] PHYSICAL MEDICINE & REHABILITATION PROGRESS NOTE   Subjective/Complaints: Still having headache in occipital area. Able to work with therapy for the most part  ROS: Limited due to cognitive/behavioral    Objective:   No results found. No results for input(s): WBC, HGB, HCT, PLT in the last 72 hours. No results for input(s): NA, K, CL, CO2, GLUCOSE, BUN, CREATININE, CALCIUM in the last 72 hours.  Intake/Output Summary (Last 24 hours) at 09/27/2019 8250 Last data filed at 09/26/2019 2335 Gross per 24 hour  Intake 600 ml  Output 800 ml  Net -200 ml     Physical Exam: Vital Signs Blood pressure 106/76, pulse 76, temperature 98.2 F (36.8 C), temperature source Oral, resp. rate 18, height 5\' 8"  (1.727 m), weight 65.4 kg, SpO2 98 %. Constitutional: No distress . Vital signs reviewed. HEENT: EOMI, oral membranes moist Neck: supple Cardiovascular: RRR without murmur. No JVD    Respiratory/Chest: CTA Bilaterally without wheezes or rales. Normal effort    GI/Abdomen: BS +, non-tender, non-distended Ext: no clubbing, cyanosis, or edema Psych: pleasant and cooperative Skin: CDI Musc: No edema in extremities.  No tenderness in extremities. Remains tender along upper cervical spine and occiput. No fluctuance or discoloration appreciated  Neuro: awake, alert. Reasonable insight and awareness Moves all 4's. Senses pain.    Assessment/Plan: 1. 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  Physiatrist is providing close team supervision and 24 hour management of active medical problems listed below.  Physiatrist and rehab team continue to assess barriers to discharge/monitor patient progress toward functional and medical goals  Care Tool:  Bathing    Body parts bathed by patient: Right arm, Left arm, Chest, Abdomen, Front perineal area, Right upper leg, Left upper leg,  Face, Buttocks   Body parts bathed by helper: Right lower leg, Left lower leg, Buttocks     Bathing assist Assist Level: Contact Guard/Touching assist     Upper Body Dressing/Undressing Upper body dressing Upper body dressing/undressing activity did not occur (including orthotics): N/A What is the patient wearing?: Pull over shirt    Upper body assist Assist Level: Minimal Assistance - Patient > 75%    Lower Body Dressing/Undressing Lower body dressing    Lower body dressing activity did not occur: N/A What is the patient wearing?: Pants, Incontinence brief     Lower body assist Assist for lower body dressing: Minimal Assistance - Patient > 75%     Toileting Toileting    Toileting assist Assist for toileting: Minimal Assistance - Patient > 75%     Transfers Chair/bed transfer  Transfers assist  Chair/bed transfer activity did not occur: N/A  Chair/bed transfer assist level: Minimal Assistance - Patient > 75%     Locomotion Ambulation   Ambulation assist      Assist level: Moderate Assistance - Patient 50 - 74% Assistive device: No Device Max distance: 120   Walk 10 feet activity   Assist     Assist level: Minimal Assistance - Patient > 75% Assistive device: No Device   Walk 50 feet activity   Assist    Assist level: Minimal Assistance - Patient > 75% Assistive device: No Device    Walk 150 feet activity   Assist Walk 150 feet activity did not occur: Safety/medical concerns         Walk 10 feet on uneven surface  activity   Assist Walk 10  feet on uneven surfaces activity did not occur: Safety/medical concerns         Wheelchair     Assist Will patient use wheelchair at discharge?: No (Per PT long term goals ) Type of Wheelchair: Manual    Wheelchair assist level: Moderate Assistance - Patient 50 - 74% Max wheelchair distance: 150    Wheelchair 50 feet with 2 turns activity    Assist        Assist Level:  Moderate Assistance - Patient 50 - 74%   Wheelchair 150 feet activity     Assist      Assist Level: Moderate Assistance - Patient 50 - 74%   Blood pressure 106/76, pulse 76, temperature 98.2 F (36.8 C), temperature source Oral, resp. rate 18, height 5\' 8"  (1.727 m), weight 65.4 kg, SpO2 98 %.  Medical Problem List and Plan: 1.Decreased functional mobility with altered mental statussecondary to traumatic bilateral SDH/SAH. Status post right frontotemporoparietal craniectomy evacuation of acute subdural hematoma and insertion of right frontal ventriculostomy insertion of craniotomy flap into the abdominal subcutaneous tissue 09/05/2019. Helmet when out of bed.  Continue CIR  -soft helmet when OOB  -dc'ed c-collar 2. Antithrombotics: -DVT/anticoagulation:Lovenox 09/09/2019. Venous Doppler studies upper lower extremities negative except superficial cephalic vein thrombosis -antiplatelet therapy: N/A 3. Pain Management:Lidoderm patch,Robaxin 500 mg every 8 hours and oxycodone as needed  -ice/heat to occipital area  -trial of topamax for headache, 25mg  bid 4. Mood:Provide emotional support -antipsychotic agents: N/A - cont sleep wake chart 5. Neuropsych: This patientis notcapable of making decisions on hisown behalf. 6. Skin/Wound Care:Routine skin checks.  -local care to abdomen and scalp 7. Fluids/Electrolytes/Nutrition:encourage PO 8. L1/2 right transverse process fractures. Conservative care 9. Urinary retention.     intermittently req I/O cath  - 7/21 ua negative, cx negative  7/22, continue urecholine as he still demonstrates retention at times 10. Dysphagia. Dysphagia #3 pain liquids. tolerating well. 11. Leukocytosis: Resolved 12. Thrombocytosis:   Platelets 696 on 7/19, improving  LOS: 7 days A FACE TO FACE EVALUATION WAS PERFORMED  Meredith Staggers 09/27/2019, 9:22 AM

## 2019-09-27 NOTE — Progress Notes (Signed)
Speech Language Pathology Daily Session Note  Patient Details  Name: Jason Skinner MRN: [id number] Date of Birth: 1990/04/25  Today's Date: 09/27/2019 SLP Individual Time: 3794-3276 SLP Individual Time Calculation (min): 55 min  Short Term Goals: Week 1: SLP Short Term Goal 1 (Week 1): Pt will consume dysphagia 3 solid texture diet and thin liquids with minimal overt s/sx aspiration and Supervision A verbal cues for use of strategies to clear oral residue. SLP Short Term Goal 2 (Week 1): Pt will demonstrate ability to use compensatory memory strategies to recall new and/or daily information with Min A verbal/visual cues. SLP Short Term Goal 3 (Week 1): Pt will verbally identify 1 physical and 1 cognitive impairment with Mod A verbal/visual cues. SLP Short Term Goal 4 (Week 1): Pt will demonstrate ability to problem solve functional situations with Min A verbal/visual cues. SLP Short Term Goal 5 (Week 1): Pt will selectively attend to functional tasks with Min A verbal/visual cues.  Skilled Therapeutic Interventions: Skilled treatment session focused on dysphagia and cognitive goals. SLP facilitated session by providing trials of regular textures. Patient demonstrated mildly prolonged mastication with mild oral residue that patient cleared with a liquid wash. Mild decreased attention was also noted with boluses, therefore, recommend trial tray prior to upgrade. Patient requested to use the bathroom and was continent of bladder with supervision verbal cues needed for safety with task. Patient also participated in an alternating attention task which patient required Mod A verbal cues for error awareness and attention. Patient asked clinician if he could pick up his truck and SLP provided education regarding current cognitive and physical deficits impacting safety with driving. Patient verbalized understanding but will need reinforcement. Patient left upright in bed with alarm on and all needs within reach.  Continue with current plan of care.      Pain Pain Assessment Pain Scale: 0-10 Pain Score: 0-No pain  Therapy/Group: Individual Therapy  Emmanuella Mirante 09/27/2019, 11:34 AM

## 2019-09-27 NOTE — Progress Notes (Signed)
Occupational Therapy Weekly Progress Note  Patient Details  Name: Jason Skinner MRN: [id number] Date of Birth: [birth year]  Beginning of progress report period: September 21, 2019 End of progress report period: September 27, 2019  Today's Date: 09/27/2019 OT Individual Time: 1400-1500 OT Individual Time Calculation (min): 60 min    Patient has met 4 of 4 short term goals. Pt has made good progress this reporting period requiring CGA for all mobility with AD or HHA, S for UB ADLS and MIN A for LB ADLs. Pt continues to increased photo/sound sensitivity, diplopia and pain intermittently limiting participation in tx. Pt is currently at rancho level 6 emerging 7.  Patient continues to demonstrate the following deficits: muscle weakness, decreased cardiorespiratoy endurance, impaired timing and sequencing, unbalanced muscle activation, ataxia and decreased coordination, decreased visual perceptual skills, decreased visual motor skills and diplopia, decreased attention to left, decreased attention, decreased awareness, decreased problem solving, decreased safety awareness and decreased memory and decreased sitting balance, decreased standing balance, decreased postural control, decreased balance strategies and difficulty maintaining precautions and therefore will continue to benefit from skilled OT intervention to enhance overall performance with BADL and iADL.  Patient progressing toward long term goals..  Continue plan of care.  OT Short Term Goals Week 1:  OT Short Term Goal 1 (Week 1): Pt will transfer to toilet with CGA and LRAD OT Short Term Goal 1 - Progress (Week 1): Met OT Short Term Goal 2 (Week 1): Pt will thread BLE into pants OT Short Term Goal 2 - Progress (Week 1): Met OT Short Term Goal 3 (Week 1): Pt will complete UB dressing with S OT Short Term Goal 3 - Progress (Week 1): Met OT Short Term Goal 4 (Week 1): Pt will groom standing at sink to demo improved endurance OT Short Term Goal 4 -  Progress (Week 1): Met Week 2:  OT Short Term Goal 1 (Week 2): STG=LTG d/t ELOS  Skilled Therapeutic Interventions/Progress Updates:    1:1. Pt received in bed agreeable to OT. Interpreter utilized throughout session, helmet donned for all mobility and sunglasses with R eye occluded utilized throughout session. Pt with no HA or neck pain this date! Pt declines BADL intervention at this time and is agreeable to outside mobility over uneven surfaces in prep for community integration. Pt completes ambulation in outside courtyard with CGA-MIN A overall with multiple instances of scissoring of LEs with fatigue, steering RW into flower beds on R and VC for stopping to reset for balance/keeping LEs inside RW. Pt educated on photo/audio sensitivity and compensating with sunglasses/hats(once skull returned to head)/earplugs. Pt verbalized understanding. Pt completes seated ball toss in tx gym with demo decrease ROM in shoulders R>L, decreased coordination of BUE and decreased reaction timing. Pt able to recall pattern of tosses 3x30 (chest, bounce and forehead height passes) in seated and standing. Pt completes 2 trials standing ball toss with CGA for standing on standard tile and up to mod A for dynamic balance on compliant surface. Pt demo increased posterior lean on compliant surface, therefore transitioned to compliant wedge to force anterior weight shifting. Pt improved from needing up to MAX A for standing balance on compliant wedge to CGA with improved anterior weight shift. Pt continues to demo poor righting reactions and delayed protective responses in standing. Exited session with pt seated in bed, exit alarm on and call light in reach  Therapy Documentation Precautions:  Precautions Precautions: Fall Precaution Comments: bone flap in abdomen, helmet when OOB Required  Braces or Orthoses: Cervical Brace, Other Brace Cervical Brace: Hard collar, At all times Other Brace: helmet for OOB   Restrictions Weight Bearing Restrictions: No General:   Vital Signs: Therapy Vitals Temp: 98.2 F (36.8 C) Temp Source: Oral Pulse Rate: 76 Resp: 18 BP: 106/76 Patient Position (if appropriate): Lying Oxygen Therapy SpO2: 98 % O2 Device: Room Air Pain:   ADL: ADL Upper Body Bathing: Minimal cueing, Supervision/safety Where Assessed-Upper Body Bathing: Sitting at sink Lower Body Bathing: Maximal assistance Where Assessed-Lower Body Bathing: Sitting at sink, Standing at sink Upper Body Dressing: Minimal assistance Where Assessed-Upper Body Dressing: Sitting at sink Lower Body Dressing: Maximal assistance Where Assessed-Lower Body Dressing: Sitting at sink, Standing at sink Toileting: Minimal assistance Where Assessed-Toileting: Glass blower/designer: Psychiatric nurse Method: Stand pivot Vision   Perception    Praxis   Exercises:   Other Treatments:     Therapy/Group: Individual Therapy  Tonny Branch 09/27/2019, 7:03 AM

## 2019-09-28 ENCOUNTER — Inpatient Hospital Stay (HOSPITAL_COMMUNITY): Payer: Self-pay | Admitting: Speech Pathology

## 2019-09-28 ENCOUNTER — Inpatient Hospital Stay (HOSPITAL_COMMUNITY): Payer: Self-pay

## 2019-09-28 ENCOUNTER — Inpatient Hospital Stay (HOSPITAL_COMMUNITY): Payer: Self-pay | Admitting: Physical Therapy

## 2019-09-28 DIAGNOSIS — D62 Acute posthemorrhagic anemia: Secondary | ICD-10-CM

## 2019-09-28 MED ORDER — ACETAMINOPHEN 500 MG PO TABS
500.0000 mg | ORAL_TABLET | ORAL | Status: DC | PRN
  Administered 2019-09-28 – 2019-10-04 (×15): 500 mg via ORAL
  Filled 2019-09-28 (×15): qty 1

## 2019-09-28 NOTE — Progress Notes (Signed)
Physical Therapy Session Note  Patient Details  Name: Jason Skinner MRN: [id number] Date of Birth: [birth year]  Today's Date: 09/28/2019 PT Individual Time: 0900-1000 PT Individual Time Calculation (min): 60 min   Short Term Goals: Week 2:  PT Short Term Goal 1 (Week 2): STG=LTG due to ELOS  Skilled Therapeutic Interventions/Progress Updates:    Pt received supine in bed, agreeable to PT session. Pt reports a headache at rest that increases slightly during mobility. Bed mobility with Supervision. Assisted pt with donning hard collar, helmet, mask, and sunglasses while seated EOB. Pt is CGA for sit to stand to RW throughout session. Ambulation 2 x 150 ft with RW and min A with ataxic and scissoring gait pattern noted with path deviation to the L. Pt also exhibits decreased attention to R visual field with cues needed to attend to obstacles. Standing alt L/R 4" step-taps initially with RW and CGA progressing to no UE support and min to mod A for balance. Pt exhibits poor awareness of when he is losing his balance and poor ability to correct. Standing lateral 4" step taps with no UE support and min to mod A for balance 2 x 10 reps each. Standing ball toss progressing to ball bounce while engaging in cognitive task naming colors with min A for static standing balance. Seated ball kick progressing to standing ball kick while naming animals with min HHA and cues for safety needed. Pt exhibits decreased speed and coordination of movements while engaging in cognitive tasks. Ambulation with RW through obstacle course weaving through cones with min A and mod cues for attention to objects in R visual field. Progression to obstacle course with increased height of objects for improved ability to visualize them with decreased cueing needed. Pt requests to return to bed at end of session. Assisted pt with doffing collar, helmet, mask, and sunglasses at EOB. Sit to supine Supervision. Pt left supine in bed with needs in  reach, bed alarm in place, family present. Interpreter present during therapy session.  Therapy Documentation Precautions:  Precautions Precautions: Fall Precaution Comments: bone flap in abdomen, helmet when OOB Required Braces or Orthoses: Cervical Brace, Other Brace Cervical Brace: Hard collar, At all times Other Brace: helmet for OOB  Restrictions Weight Bearing Restrictions: No   Therapy/Group: Individual Therapy   Excell Seltzer, PT, DPT  09/28/2019, 12:02 PM

## 2019-09-28 NOTE — Progress Notes (Signed)
Staples removed, per order. Patient tolerated well. Incision is approximated. Incision site has old scabbing present. Site cleaned.

## 2019-09-28 NOTE — Progress Notes (Signed)
[place] PHYSICAL MEDICINE & REHABILITATION PROGRESS NOTE   Subjective/Complaints: Patient seen laying in bed this morning.  He indicates he slept well overnight.  No reported issues overnight.  Slept well per nursing.  He complains of headaches, discussed with nursing.  He has not received his a.m. medications.  ROS: Limited due to cognition/language.  Objective:   No results found. No results for input(s): WBC, HGB, HCT, PLT in the last 72 hours. No results for input(s): NA, K, CL, CO2, GLUCOSE, BUN, CREATININE, CALCIUM in the last 72 hours.  Intake/Output Summary (Last 24 hours) at 09/28/2019 1108 Last data filed at 09/28/2019 0800 Gross per 24 hour  Intake 1060 ml  Output 825 ml  Net 235 ml     Physical Exam: Vital Signs Blood pressure 104/69, pulse 70, temperature 97.9 F (36.6 C), resp. rate 16, height 5\' 8"  (1.727 m), weight 65.4 kg, SpO2 97 %. Constitutional: No distress . Vital signs reviewed. HENT: Craniectomy with staples C/D/I Eyes: EOMI. No discharge. Cardiovascular: No JVD.  RRR. Respiratory: Normal effort.  No stridor.  Bilateral clear to auscultation. GI: Non-distended.  BS +. Skin: Scalp/abdomen C/D/I Psych: Unable to assess due to language Musc: No edema in extremities.  No tenderness in extremities. Neuro: Alert Motor: Grossly 4+/5 throughout  Assessment/Plan: 1. 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  Physiatrist is providing close team supervision and 24 hour management of active medical problems listed below.  Physiatrist and rehab team continue to assess barriers to discharge/monitor patient progress toward functional and medical goals  Care Tool:  Bathing    Body parts bathed by patient: Right arm, Left arm, Chest, Abdomen, Front perineal area, Right upper leg, Left upper leg, Face, Buttocks   Body parts bathed by helper: Right  lower leg, Left lower leg, Buttocks     Bathing assist Assist Level: Contact Guard/Touching assist     Upper Body Dressing/Undressing Upper body dressing Upper body dressing/undressing activity did not occur (including orthotics): N/A What is the patient wearing?: Pull over shirt    Upper body assist Assist Level: Minimal Assistance - Patient > 75%    Lower Body Dressing/Undressing Lower body dressing    Lower body dressing activity did not occur: N/A What is the patient wearing?: Pants, Incontinence brief     Lower body assist Assist for lower body dressing: Minimal Assistance - Patient > 75%     Toileting Toileting    Toileting assist Assist for toileting: Minimal Assistance - Patient > 75%     Transfers Chair/bed transfer  Transfers assist  Chair/bed transfer activity did not occur: N/A  Chair/bed transfer assist level: Minimal Assistance - Patient > 75%     Locomotion Ambulation   Ambulation assist      Assist level: Moderate Assistance - Patient 50 - 74% Assistive device: Hand held assist Max distance: >250   Walk 10 feet activity   Assist     Assist level: Minimal Assistance - Patient > 75% Assistive device: Hand held assist   Walk 50 feet activity   Assist    Assist level: Minimal Assistance - Patient > 75% Assistive device: Hand held assist    Walk 150 feet activity   Assist Walk 150 feet activity did not occur: Safety/medical concerns  Assist level: Moderate Assistance - Patient - 50 - 74% Assistive device: Hand held assist    Walk 10 feet on uneven surface  activity   Assist Walk 10 feet on uneven surfaces activity did not occur: Safety/medical concerns         Wheelchair     Assist Will patient use wheelchair at discharge?: No (Per PT long term goals ) Type of Wheelchair: Manual    Wheelchair assist level: Moderate Assistance - Patient 50 - 74% Max wheelchair distance: 150    Wheelchair 50 feet with 2 turns  activity    Assist        Assist Level: Moderate Assistance - Patient 50 - 74%   Wheelchair 150 feet activity     Assist      Assist Level: Moderate Assistance - Patient 50 - 74%   Blood pressure 104/69, pulse 70, temperature 97.9 F (36.6 C), resp. rate 16, height 5\' 8"  (1.727 m), weight 65.4 kg, SpO2 97 %.  Medical Problem List and Plan: 1.Decreased functional mobility with altered mental statussecondary to traumatic bilateral SDH/SAH. Status post right frontotemporoparietal craniectomy evacuation of acute subdural hematoma and insertion of right frontal ventriculostomy insertion of craniotomy flap into the abdominal subcutaneous tissue 09/05/2019. Helmet when out of bed.  Continue CIR  -soft helmet when OOB  -dc'ed c-collar  DC'd staples 2. Antithrombotics: -DVT/anticoagulation:Lovenox 09/09/2019. Venous Doppler studies upper lower extremities negative except superficial cephalic vein thrombosis -antiplatelet therapy: N/A 3. Pain Management:Lidoderm patch,Robaxin 500 mg every 8 hours and oxycodone as needed  -ice/heat to occipital area  -trial of topamax for headache, started on 7/22, will consider further changes tomorrow if no improvement  Tylenol scheduled 4. Mood:Provide emotional support -antipsychotic agents: N/A - cont sleep wake chart 5. Neuropsych: This patientis notcapable of making decisions on hisown behalf. 6. Skin/Wound Care:Routine skin checks.  -local care to abdomen and scalp 7. Fluids/Electrolytes/Nutrition:encourage PO  BMP ordered for Monday 8. L1/2 right transverse process fractures. Conservative care 9. Urinary retention.     intermittently req I/O cath  - 7/21 ua negative, cx negative  Continue Urecholine 10. Dysphagia. Dysphagia #3 thin liquids. tolerating well. 11. Leukocytosis: Resolved 12. Thrombocytosis:   Platelets 696 on 7/19 13.  Mild acute blood loss  anemia  Hemoglobin 12.9 on 7/19, continue to monitor  LOS: 8 days A FACE TO FACE EVALUATION WAS PERFORMED  Razia Screws [name] 09/28/2019, 11:08 AM

## 2019-09-28 NOTE — Progress Notes (Signed)
Speech Language Pathology Weekly Progress Note  Patient Details  Name: Laureano Hetzer MRN: [id number] Date of Birth: 09-24-1990  Beginning of progress report period: September 21, 2019 End of progress report period: September 28, 2019  Short Term Goals: Week 1: SLP Short Term Goal 1 (Week 1): Pt will consume dysphagia 3 solid texture diet and thin liquids with minimal overt s/sx aspiration and Supervision A verbal cues for use of strategies to clear oral residue. SLP Short Term Goal 1 - Progress (Week 1): Met SLP Short Term Goal 2 (Week 1): Pt will demonstrate ability to use compensatory memory strategies to recall new and/or daily information with Min A verbal/visual cues. SLP Short Term Goal 2 - Progress (Week 1): Not met SLP Short Term Goal 3 (Week 1): Pt will verbally identify 1 physical and 1 cognitive impairment with Mod A verbal/visual cues. SLP Short Term Goal 3 - Progress (Week 1): Met SLP Short Term Goal 4 (Week 1): Pt will demonstrate ability to problem solve functional situations with Min A verbal/visual cues. SLP Short Term Goal 4 - Progress (Week 1): Met SLP Short Term Goal 5 (Week 1): Pt will selectively attend to functional tasks with Min A verbal/visual cues. SLP Short Term Goal 5 - Progress (Week 1): Not met    New Short Term Goals: Week 2: SLP Short Term Goal 1 (Week 2): STGs=LTGs due to ELOS  Weekly Progress Updates: Patient has made functional gains and has met 3 of 5 STGs this reporting period. Currently, patient demonstrates behaviors consistent with a Rancho Level VI-emerging VII and requires overall Min-Mod A verbal and visual cues for functional problem solving, selective attention, recall of daily information and intellectual awareness of deficits. Mod-Max verbal cues are also needed for safety awareness. Patient is consuming Dys. 3 textures with thin liquids with minimal overt s/s of aspiration but requires Min A verbal cues for attention to task and to self-monitor and correct  oral residue. Recommend continued trials prior to upgrade. Patient would benefit from continued skilled SLP intervention to maximize his cognitive and swallowing functioning and overall functional independence prior to discharge.     Intensity: Minumum of 1-2 x/day, 30 to 90 minutes Frequency: 3 to 5 out of 7 days Duration/Length of Stay: 10/05/19 Treatment/Interventions: Cognitive remediation/compensation;Cueing hierarchy;Dysphagia/aspiration precaution training;Functional tasks;Patient/family education;Internal/external aids;Therapeutic Activities;Environmental controls    [place], [place] 09/28/2019, 6:41 AM

## 2019-09-28 NOTE — Progress Notes (Signed)
Patient ID: Jason Skinner, male   DOB: 04/12/1990, 29 y.o.   MRN: [id number]   09/27/2019- SW met with pt and pt cousin Moses in room using [employer] Spanish interpreter Cesar ID#[id number] to discuss pt d/c recommendations for therapies, new patient appointment, medications (I.e. MATCH), and family education. Moses will be here on Monday 8am-12pm for family education. He is aware SW to follow-up with pt brother [name] to discuss further  09/28/219- SW spoke with Dee[facility] ([phone]) to schedule new patient appt and establishing care, Aug 5th at 1:20pm with Crystal King, PA.   Tony/Language services to help with relaying information to pt brother [name] ([phone]). SW discussed d/c plan. Confirms that cousin and two others will be providing 24/7 care. SW discussed family education and being present on Monday for family edu as well. Would like follow-up with d/c recommendations.    , MSW, LCSWA Office: [phone] Cell: [phone] Fax: [phone] 

## 2019-09-28 NOTE — Progress Notes (Signed)
Speech Language Pathology Daily Session Note  Patient Details  Name: Jason Skinner MRN: [id number] Date of Birth: 03/31/1990  Today's Date: 09/28/2019 SLP Individual Time: 1100-1155 SLP Individual Time Calculation (min): 55 min  Short Term Goals: Week 2: SLP Short Term Goal 1 (Week 2): STGs=LTGs due to ELOS  Skilled Therapeutic Interventions:   Patient seen to address cognitive function goals with live Spanish language interpreter present during session. Patient was a little lethargic and c/o 5/10 headache but participated in all structured tasks. Patient only able to independently state "I had an accident" and impairment as "just headaches" but with SLP providing semantic cues and question cues,  patient did state that he fell "15 feet" and that he was having "balance difficulty". Initially when SLP informed patient of surgical intervention regarding his head, he said he had not heard this before, but later on in session, he was able to demonstrate recall of this information and told clinician that his abdominal surgery was to "preserve" the part of his skull and the helmet was "for safety". Patient required moderate frequency of verbal cues to redirect attention and to respond to open-ended questions as he initially would answer with yes/no. Affect was flat, even when talking about family. Patient also required verbal cues to initiate functional tasks such as opening up and pouring drink into cup.  Pain Pain Assessment Pain Scale: 0-10 Pain Score: 5  Pain Type: Acute pain;Chronic pain Pain Location: Head Pain Orientation: Right;Anterior;Posterior Pain Descriptors / Indicators: Headache Pain Onset: On-going Pain Intervention(s): RN made aware Multiple Pain Sites: No  Therapy/Group: Individual Therapy  Sonia Baller, MA, CCC-SLP Speech Therapy

## 2019-09-28 NOTE — Progress Notes (Signed)
Occupational Therapy Session Note  Patient Details  Name: Jason Skinner MRN: [id number] Date of Birth: May 22, 1990  Today's Date: 09/28/2019 OT Individual Time: 1415-1530 OT Individual Time Calculation (min): 75 min    Short Term Goals: Week 2:  OT Short Term Goal 1 (Week 2): STG=LTG d/t ELOS  Skilled Therapeutic Interventions/Progress Updates:    1:1. Pt received in bed agreeable ot OT in person or tele interpretation utilized thorughotu session. Pt completes ambulation CGA HHA into/out of bathroom with helmet donned to transfer into shower for bathing sit to stand from shower seat and dress sit to stand from [facility] with supervision/CGA. Pt demo poor recall of bathed body parts, bathing each one 3x prior to OT cuing to turn off water. Pt completes boxing activity in seated, standing and stepping laterally around boxing bag alternating arm puches and coordinating steps with increased difficulty stepping L/slower speed with CGA-MIN A overall. Pt completes dynavision, 9HPT and dynamometer as follows:  Dynavision seated: 2.8L 2.6 R Dynavision standing 4.8L 3.3 R Dynamometer RUE: 55#  LUE: 80# 9HPT without occlusive glasses RUE: 40 sec  LUE: 44 sec  Exited session with pt seated in bed, exit alarm on and call light in reach  Therapy Documentation Precautions:  Precautions Precautions: Fall Precaution Comments: bone flap in abdomen, helmet when OOB Required Braces or Orthoses: Cervical Brace, Other Brace Cervical Brace: Hard collar, At all times Other Brace: helmet for OOB  Restrictions Weight Bearing Restrictions: No General:   Vital Signs: Therapy Vitals Temp: 98.1 F (36.7 C) Pulse Rate: 79 Resp: 18 BP: 108/75 Patient Position (if appropriate): Lying Oxygen Therapy SpO2: 99 % O2 Device: Room Air Pain: Pain Assessment Pain Scale: 0-10 Pain Score: 5  Pain Type: Acute pain;Chronic pain Pain Location: Head Pain Orientation: Right;Anterior;Posterior Pain Descriptors /  Indicators: Headache Pain Onset: On-going Pain Intervention(s): RN made aware Multiple Pain Sites: No ADL: ADL Upper Body Bathing: Minimal cueing, Supervision/safety Where Assessed-Upper Body Bathing: Sitting at sink Lower Body Bathing: Maximal assistance Where Assessed-Lower Body Bathing: Sitting at sink, Standing at sink Upper Body Dressing: Minimal assistance Where Assessed-Upper Body Dressing: Sitting at sink Lower Body Dressing: Maximal assistance Where Assessed-Lower Body Dressing: Sitting at sink, Standing at sink Toileting: Minimal assistance Where Assessed-Toileting: Glass blower/designer: Psychiatric nurse Method: Stand pivot Vision   Perception    Praxis   Exercises:   Other Treatments:     Therapy/Group: Individual Therapy  Tonny Branch 09/28/2019, 3:29 PM

## 2019-09-29 DIAGNOSIS — S065X3D Traumatic subdural hemorrhage with loss of consciousness of 1 hour to 5 hours 59 minutes, subsequent encounter: Secondary | ICD-10-CM

## 2019-09-29 DIAGNOSIS — G441 Vascular headache, not elsewhere classified: Secondary | ICD-10-CM

## 2019-09-29 NOTE — Progress Notes (Signed)
[facility] PHYSICAL MEDICINE & REHABILITATION PROGRESS NOTE   Subjective/Complaints: Patient seen laying in bed this morning.  He states he slept well overnight.  He indicates improvement in headaches.  ROS: Limited due to cognition/language.  Objective:   No results found. No results for input(s): WBC, HGB, HCT, PLT in the last 72 hours. No results for input(s): NA, K, CL, CO2, GLUCOSE, BUN, CREATININE, CALCIUM in the last 72 hours.  Intake/Output Summary (Last 24 hours) at 09/29/2019 1703 Last data filed at 09/29/2019 1402 Gross per 24 hour  Intake 340 ml  Output 800 ml  Net -460 ml     Physical Exam: Vital Signs Blood pressure 106/75, pulse 77, temperature 98.4 F (36.9 C), resp. rate 18, height 5\' 8"  (1.727 m), weight 65.4 kg, SpO2 100 %. Constitutional: No distress . Vital signs reviewed. HENT: Craniectomy Eyes: EOMI. No discharge. Cardiovascular: No JVD.  Respiratory: Normal effort.  No stridor. GI: Non-distended. Skin: Scalp/abdomen C/D/I Psych: Unable to assess due to language Musc: No edema in extremities.  No tenderness in extremities. Neuro: Alert Motor: Grossly 4+/5 throughout, unchanged  Assessment/Plan: 1. 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  Physiatrist is providing close team supervision and 24 hour management of active medical problems listed below.  Physiatrist and rehab team continue to assess barriers to discharge/monitor patient progress toward functional and medical goals  Care Tool:  Bathing    Body parts bathed by patient: Right arm, Left arm, Chest, Abdomen, Front perineal area, Right upper leg, Left upper leg, Face, Buttocks   Body parts bathed by helper: Right lower leg, Left lower leg, Buttocks     Bathing assist Assist Level: Contact Guard/Touching assist     Upper Body Dressing/Undressing Upper body dressing Upper  body dressing/undressing activity did not occur (including orthotics): N/A What is the patient wearing?: Pull over shirt    Upper body assist Assist Level: Minimal Assistance - Patient > 75%    Lower Body Dressing/Undressing Lower body dressing    Lower body dressing activity did not occur: N/A What is the patient wearing?: Pants, Incontinence brief     Lower body assist Assist for lower body dressing: Minimal Assistance - Patient > 75%     Toileting Toileting    Toileting assist Assist for toileting: Minimal Assistance - Patient > 75%     Transfers Chair/bed transfer  Transfers assist  Chair/bed transfer activity did not occur: N/A  Chair/bed transfer assist level: Contact Guard/Touching assist     Locomotion Ambulation   Ambulation assist      Assist level: Minimal Assistance - Patient > 75% Assistive device: Walker-rolling Max distance: >250   Walk 10 feet activity   Assist     Assist level: Minimal Assistance - Patient > 75% Assistive device: Walker-rolling   Walk 50 feet activity   Assist    Assist level: Minimal Assistance - Patient > 75% Assistive device: Walker-rolling    Walk 150 feet activity   Assist Walk 150 feet activity did not occur: Safety/medical concerns  Assist level: Minimal Assistance - Patient > 75% Assistive device: Walker-rolling    Walk 10 feet on uneven surface  activity   Assist Walk 10 feet on uneven surfaces activity did not occur: Safety/medical concerns         Wheelchair     Assist Will patient use wheelchair at discharge?: No (Per PT long term goals ) Type  of Wheelchair: Manual    Wheelchair assist level: Moderate Assistance - Patient 50 - 74% Max wheelchair distance: 150    Wheelchair 50 feet with 2 turns activity    Assist        Assist Level: Moderate Assistance - Patient 50 - 74%   Wheelchair 150 feet activity     Assist      Assist Level: Moderate Assistance - Patient  50 - 74%   Blood pressure 106/75, pulse 77, temperature 98.4 F (36.9 C), resp. rate 18, height 5\' 8"  (1.727 m), weight 65.4 kg, SpO2 100 %.  Medical Problem List and Plan: 1.Decreased functional mobility with altered mental statussecondary to traumatic bilateral SDH/SAH. Status post right frontotemporoparietal craniectomy evacuation of acute subdural hematoma and insertion of right frontal ventriculostomy insertion of craniotomy flap into the abdominal subcutaneous tissue 09/05/2019. Helmet when out of bed.  Continue CIR  -soft helmet when OOB  -dc'ed c-collar 2. Antithrombotics: -DVT/anticoagulation:Lovenox 09/09/2019. Venous Doppler studies upper lower extremities negative except superficial cephalic vein thrombosis -antiplatelet therapy: N/A 3. Pain Management:Lidoderm patch,Robaxin 500 mg every 8 hours and oxycodone as needed  -ice/heat to occipital area  -trial of topamax for headache, started on 7/22  Tylenol scheduled  Appears to be improving on 7/24 4. Mood:Provide emotional support -antipsychotic agents: N/A - cont sleep wake chart 5. Neuropsych: This patientis notcapable of making decisions on hisown behalf. 6. Skin/Wound Care:Routine skin checks.  -local care to abdomen and scalp 7. Fluids/Electrolytes/Nutrition:encourage PO  BMP ordered for Monday 8. L1/2 right transverse process fractures. Conservative care 9. Urinary retention.     intermittently req I/O cath  - 7/21 ua negative, cx negative  Continue Urecholine  Voiding now 10. Dysphagia. Dysphagia #3 thins liquids. tolerating well. 11. Leukocytosis: Resolved 12. Thrombocytosis:   Platelets 696 on 7/19 13.  Mild acute blood loss anemia  Hemoglobin 12.9 on 7/19, continue to monitor  LOS: 9 days A FACE TO FACE EVALUATION WAS PERFORMED  [name] [name] 09/29/2019, 5:03 PM

## 2019-09-30 ENCOUNTER — Inpatient Hospital Stay (HOSPITAL_COMMUNITY): Payer: Self-pay

## 2019-09-30 NOTE — Progress Notes (Signed)
Physical Therapy Session Note  Patient Details  Name: Jason Skinner MRN: [id number] Date of Birth: 04-16-1990  Today's Date: 09/30/2019 PT Individual Time: 0904-1000 PT Individual Time Calculation (min): 56 min   Short Term Goals: Week 2:  PT Short Term Goal 1 (Week 2): STG=LTG due to ELOS  Skilled Therapeutic Interventions/Progress Updates:     Patient in bed upon PT arrival. Patient alert and agreeable to PT session. Patient reported 5/10 headacd during session, RN premedicated patient prior to session. PT provided repositioning, rest breaks, and distraction as pain interventions throughout session. Live interpreter present for entire session.  Therapeutic Activity: Bed Mobility: Patient performed supine to/from sit with supervision for safety with HOB elevated. Donned face mask, sunglasses, and helmet sitting EOB. Required assistance with helmet strap.  Transfers: Patient performed sit to/from stand x9 with supervision without an AD from the bed and mat table throughout session. Provided verbal cues for forward weight shift x2 due to mild posterior bias.  Gait Training:  Patient ambulated >150 feet x2 without an AD with CGA. Ambulated with improved stepping consistency, increased BOS, without scissoring today, continues to have narrow BOS, decreased R attention, decreased gait speed, step height, and step length, and mild trunk sway. Provided verbal cues for increased BOS, increased step height, and increased arm swing for improved balance. Patient ascended/descended 16 steps using B rails with CGA. Performed step-to gait pattern alternating leading LE throughout. Provided cues for technique and sequencing.   Neuromuscular Re-ed: Patient performed the following dynamic gait and dual task activities with CGA and min A for losses of balance: -weaving through 8 cones x2 1.5 ft apart, 1 LOB -weaving through 8 cones x2 1.5 ft apart while holding a ball, hit 2 cones and 1 LOB -weaving through 8  cones x4 1.5 ft apart while tossing a ball, hit 1 cone and 2 LOB -weaving through 8 cones x2 1.5 ft apart while tossing a ball and answering single digit multiplication problems, hit 1 cone, 2 LOB, and decreased speed -stepping over  8 cones x2 1 ft apart, unable to clear cones, compensated with circumduction  -stepping over 8 staggered cones x2 1 ft apart following demonstration of proper technique, 2 LOB improved clearance of cones throughout -stepping over 8 staggered cones x2 1 ft apart while holding a ball, hit 1 cone, missed 2 cones, and 1 LOB -stepping over 8 staggered cones x2 1 ft apart while tossing a ball, missed 2 cones and 2 LOB -stepping over 8 staggered cones x4 1 ft apart while tossing a ball and counting backwards from 72, missed 5 cones and 2 LOB  Patient tolerated treatment well today with improved participation and activity tolerance. Also, patient was able to progress to dual task activities and improved gait following NMR activities.   Patient in bed with helmet and sunglasses doffed at end of session with breaks locked, bed alarm set, and all needs within reach.    Therapy Documentation Precautions:  Precautions Precautions: Fall Precaution Comments: bone flap in abdomen, helmet when OOB Required Braces or Orthoses: Cervical Brace, Other Brace Cervical Brace: Hard collar, At all times Other Brace: helmet for OOB  Restrictions Weight Bearing Restrictions: No    Therapy/Group: Individual Therapy  Gissele Narducci L Arizbeth Cawthorn PT, DPT  09/30/2019, 4:24 PM

## 2019-09-30 NOTE — Progress Notes (Signed)
[facility] PHYSICAL MEDICINE & REHABILITATION PROGRESS NOTE   Subjective/Complaints: Patient seen laying in bed this morning.  He states he slept well overnight.  No reported issues overnight.  He endorses improvement in headaches.  ROS: Limited due to cognition/language  Objective:   No results found. No results for input(s): WBC, HGB, HCT, PLT in the last 72 hours. No results for input(s): NA, K, CL, CO2, GLUCOSE, BUN, CREATININE, CALCIUM in the last 72 hours.  Intake/Output Summary (Last 24 hours) at 09/30/2019 1443 Last data filed at 09/30/2019 1255 Gross per 24 hour  Intake 820 ml  Output 750 ml  Net 70 ml     Physical Exam: Vital Signs Blood pressure 101/68, pulse 69, temperature 98.7 F (37.1 C), temperature source Oral, resp. rate 18, height 5\' 8"  (1.727 m), weight 65.4 kg, SpO2 100 %. Constitutional: No distress . Vital signs reviewed. HENT: Craniectomy CDI Eyes: EOMI. No discharge. Cardiovascular: No JVD. Respiratory: Normal effort.  No stridor. GI: Non-distended. Skin: Scalp/abdomen CDI Psych: Unable to assess due to language/cognition Musc: No edema in extremities.  No tenderness in extremities. Neuro: Alert Motor: Grossly 4+/5 throughout, appears stable  Assessment/Plan: 1. 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  Physiatrist is providing close team supervision and 24 hour management of active medical problems listed below.  Physiatrist and rehab team continue to assess barriers to discharge/monitor patient progress toward functional and medical goals  Care Tool:  Bathing    Body parts bathed by patient: Right arm, Left arm, Chest, Abdomen, Front perineal area, Right upper leg, Left upper leg, Face, Buttocks   Body parts bathed by helper: Right lower leg, Left lower leg, Buttocks     Bathing assist Assist Level: Contact Guard/Touching  assist     Upper Body Dressing/Undressing Upper body dressing Upper body dressing/undressing activity did not occur (including orthotics): N/A What is the patient wearing?: Pull over shirt    Upper body assist Assist Level: Minimal Assistance - Patient > 75%    Lower Body Dressing/Undressing Lower body dressing    Lower body dressing activity did not occur: N/A What is the patient wearing?: Pants, Incontinence brief     Lower body assist Assist for lower body dressing: Minimal Assistance - Patient > 75%     Toileting Toileting    Toileting assist Assist for toileting: Minimal Assistance - Patient > 75%     Transfers Chair/bed transfer  Transfers assist  Chair/bed transfer activity did not occur: N/A  Chair/bed transfer assist level: Contact Guard/Touching assist     Locomotion Ambulation   Ambulation assist      Assist level: Minimal Assistance - Patient > 75% Assistive device: Walker-rolling Max distance: >250   Walk 10 feet activity   Assist     Assist level: Minimal Assistance - Patient > 75% Assistive device: Walker-rolling   Walk 50 feet activity   Assist    Assist level: Minimal Assistance - Patient > 75% Assistive device: Walker-rolling    Walk 150 feet activity   Assist Walk 150 feet activity did not occur: Safety/medical concerns  Assist level: Minimal Assistance - Patient > 75% Assistive device: Walker-rolling    Walk 10 feet on uneven surface  activity   Assist Walk 10 feet on uneven surfaces activity did not occur: Safety/medical concerns         Wheelchair     Assist Will patient use wheelchair at  discharge?: No (Per PT long term goals ) Type of Wheelchair: Manual    Wheelchair assist level: Moderate Assistance - Patient 50 - 74% Max wheelchair distance: 150    Wheelchair 50 feet with 2 turns activity    Assist        Assist Level: Moderate Assistance - Patient 50 - 74%   Wheelchair 150 feet  activity     Assist      Assist Level: Moderate Assistance - Patient 50 - 74%   Blood pressure 101/68, pulse 69, temperature 98.7 F (37.1 C), temperature source Oral, resp. rate 18, height 5\' 8"  (1.727 m), weight 65.4 kg, SpO2 100 %.  Medical Problem List and Plan: 1.Decreased functional mobility with altered mental statussecondary to traumatic bilateral SDH/SAH. Status post right frontotemporoparietal craniectomy evacuation of acute subdural hematoma and insertion of right frontal ventriculostomy insertion of craniotomy flap into the abdominal subcutaneous tissue 09/05/2019. Helmet when out of bed.  Continue CIR  -soft helmet when OOB  -dc'ed c-collar 2. Antithrombotics: -DVT/anticoagulation:Lovenox 09/09/2019. Venous Doppler studies upper lower extremities negative except superficial cephalic vein thrombosis -antiplatelet therapy: N/A 3. Pain Management:Lidoderm patch,Robaxin 500 mg every 8 hours and oxycodone as needed  -ice/heat to occipital area  -trial of topamax for headache, started on 7/22  Tylenol scheduled  Controlled on 7/25 4. Mood:Provide emotional support -antipsychotic agents: N/A - cont sleep wake chart 5. Neuropsych: This patientis notcapable of making decisions on hisown behalf. 6. Skin/Wound Care:Routine skin checks.  -local care to abdomen and scalp 7. Fluids/Electrolytes/Nutrition:encourage PO  BMP ordered for tomorrow 8. L1/2 right transverse process fractures. Conservative care 9. Urinary retention.     intermittently req I/O cath  - 7/21 ua negative, cx negative  Continue Urecholine  Voiding now 10. Dysphagia. Dysphagia #3 thin liquids.  11. Leukocytosis: Resolved 12. Thrombocytosis:   Platelets 696 on 7/19 13.  Mild acute blood loss anemia  Hemoglobin 12.9 on 7/19, continue to monitor  LOS: 10 days A FACE TO FACE EVALUATION WAS PERFORMED  Keshona Kartes [name] 09/30/2019, 2:43 PM

## 2019-10-01 ENCOUNTER — Ambulatory Visit (HOSPITAL_COMMUNITY): Payer: Self-pay

## 2019-10-01 ENCOUNTER — Encounter (HOSPITAL_COMMUNITY): Payer: Self-pay

## 2019-10-01 ENCOUNTER — Encounter (HOSPITAL_COMMUNITY): Payer: Self-pay | Admitting: Speech Pathology

## 2019-10-01 DIAGNOSIS — I82619 Acute embolism and thrombosis of superficial veins of unspecified upper extremity: Secondary | ICD-10-CM

## 2019-10-01 LAB — BASIC METABOLIC PANEL
Anion gap: 9 (ref 5–15)
BUN: 13 mg/dL (ref 6–20)
CO2: 26 mmol/L (ref 22–32)
Calcium: 9.8 mg/dL (ref 8.9–10.3)
Chloride: 104 mmol/L (ref 98–111)
Creatinine, Ser: 0.79 mg/dL (ref 0.61–1.24)
GFR calc Af Amer: >60 mL/min (ref >60)
GFR calc non Af Amer: >60 mL/min (ref >60)
Glucose, Bld: 94 mg/dL (ref 70–99)
Potassium: 3.6 mmol/L (ref 3.5–5.1)
Sodium: 139 mmol/L (ref 135–145)

## 2019-10-01 MED ORDER — TOPIRAMATE 25 MG PO TABS
50.0000 mg | ORAL_TABLET | Freq: Two times a day (BID) | ORAL | Status: DC
  Administered 2019-10-01 – 2019-10-02 (×2): 50 mg via ORAL
  Filled 2019-10-01 (×2): qty 2

## 2019-10-01 NOTE — Progress Notes (Signed)
Speech Language Pathology Daily Session Note  Patient Details  Name: Jason Skinner MRN: [id number] Date of Birth: [birth year]  Today's Date: 10/01/2019 SLP Individual Time: 7035-0093 SLP Individual Time Calculation (min): 45 min  Short Term Goals: Week 2: SLP Short Term Goal 1 (Week 2): STGs=LTGs due to ELOS  Skilled Therapeutic Interventions: Pt was seen for skilled ST targeting education with pt's family members and working toward cognitive goals. SLP facilitated education session with review of pt's current diet textures and rationale behind soft solids. Also discussed safe swallow strategies and potential to trial regular textures as mastication becomes less painful for pt, but still to check for oral clearance at end of meals. SLP also provided verbal review of current cognitive deficits and potential impact on home life/daily functioning. Review of compensatory strategies for short term memory provided, as well as ways to maximize attention and cognitive processing. Explicit recommendation provided for family to provide pt with assistance with medication and finance management, as well as cooking. Pt's family acknowledged recommendation for 24/7 supervision at home and states a combination of family members will be able to provide it.  Pt also engaged in a basic money management task from the ALFA, using cash/coins. Per ST notes, pt performed this task earlier in stay, and today he demonstrated more independence with this task in comparison to last time it was targeted. Pt required overall Min A verbal cues for awareness of functional errors, but only Supervision A for problem solving how to correct them. His verbal problem solving was more accurate than functional problem solving. Use of strategy to verbalize while displaying his change increased functional accuracy, however he required Min-Mod cues to recall this strategy throughout task. Pt requested to lay down and family with no further questions,  therefore, he missed remaining last 15 minutes of session. Pt left laying in bed with alarm set and needs within reach. Continue per current plan of care.          Pain Pain Assessment Pain Scale: Faces Faces Pain Scale: No hurt   Therapy/Group: Individual Therapy  Jason Skinner 10/01/2019, 9:56 AM

## 2019-10-01 NOTE — Progress Notes (Signed)
Physical Therapy Session Note  Patient Details  Name: Jason Skinner MRN: [id number] Date of Birth: 23-Jun-1990  Today's Date: 10/01/2019 PT Individual Time: 1975-8832 PT Individual Time Calculation (min): 45 min   Short Term Goals: Week 2:  PT Short Term Goal 1 (Week 2): STG=LTG due to ELOS  Skilled Therapeutic Interventions/Progress Updates:     Patient in bed with his brother in the room upon PT arrival. Patient alert and agreeable to PT session. Patient reported 4-5/10 headache during session, reported he was premedicated prior to session. PT provided repositioning, rest breaks, and distraction as pain interventions throughout session. Utilized Stratus system for interpretation throughout session. Patient's cousin arrived at beginning of session, patient's brother and cousin participated in family education throughout session. Helmet donned with mod A for strap management prior to mobility. Educated patient and family on donning helmet prior to getting OOB and maintain helmet when patient is OOB. Educated on purpose of helmet and safety concerns with current craniectomy. Patient and family stated understanding  Therapeutic Activity: Bed Mobility: Patient performed supine to/from sit with supervision on hospital bed and ADL bed. Provided verbal cues for placement of multiple pillows to elevate patient's head in a regular bed, cues for blocking patient during mobility for safety.  Transfers: Patient performed sit to/from stand from hospital bed, ADL couch, ADL bed, and mat table with CGA-close supervision without AD. Provided verbal cues for safe guarding technique for family. Patient performed a simulated sedan height car transfer with CGA using car frame for steadying assist. Provided cues for safe technique including sitting prior to putting his feet in the car versus stepping in for increased safety.  Gait Training:  Patient ambulated >150 feet x2 and 25-50 feet over carpet in ADL apartment with  family providing assist following PT demonstration for guarding technique without an AD with CGA. Ambulated with mild imbalance with gait, mild posterior bias, and narrow BOS. Provided verbal cues for cueing patient to scan to the R and attend the the R while ambulating for safety. Patient ambulated up/down a ramp, over 10 feet of mulch (unlevel surface), and up/down a curb to simulate community ambulation over unlevel surfaces with CGA using intermittent use of 1 rail. Provided cues for technique and safe guarding. Educated and demonstrated guarding for curb/stairs on downhill side to prevent falls.  Educated patient and family on fall risk/prevention, home modifications to prevent falls, and activation of emergency services in the event of a fall during session.   Patient in bed at end of session with breaks locked, bed alarm set, and all needs within reach.    Therapy Documentation Precautions:  Precautions Precautions: Fall Precaution Comments: bone flap in abdomen, helmet when OOB Required Braces or Orthoses: Cervical Brace, Other Brace Cervical Brace: Hard collar, At all times Other Brace: helmet for OOB  Restrictions Weight Bearing Restrictions: No    Therapy/Group: Individual Therapy  Lorenna Lurry L Loic Hobin PT, DPT   10/01/2019, 12:57 PM

## 2019-10-01 NOTE — Progress Notes (Addendum)
[facility] PHYSICAL MEDICINE & REHABILITATION PROGRESS NOTE   Subjective/Complaints: Patient seen laying in bed this morning.  He indicates he slept well overnight.  No reported issues overnight.  Denies headaches.  ROS: Limited due to cognition/language  Objective:   No results found. No results for input(s): WBC, HGB, HCT, PLT in the last 72 hours. Recent Labs    10/01/19 0626  NA 139  K 3.6  CL 104  CO2 26  GLUCOSE 94  BUN 13  CREATININE 0.79  CALCIUM 9.8    Intake/Output Summary (Last 24 hours) at 10/01/2019 1317 Last data filed at 10/01/2019 0800 Gross per 24 hour  Intake 480 ml  Output 1325 ml  Net -845 ml     Physical Exam: Vital Signs Blood pressure 114/82, pulse 80, temperature 97.8 F (36.6 C), resp. rate 17, height 5\' 8"  (1.727 m), weight 65.4 kg, SpO2 100 %. Constitutional: No distress . Vital signs reviewed. HENT: Craniectomy CDI Eyes: Right eye with limitations in left gaze. No discharge. Cardiovascular: No JVD. Respiratory: Normal effort.  No stridor. GI: Non-distended. Skin: Scalp/abdomen CDI Psych: Unable to assess due to language/cognition Musc: No edema in extremities.  No tenderness in extremities. Neuro: Alert Motor: Grossly 4+/5 throughout, appears unchanged  Assessment/Plan: 1. 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  Physiatrist is providing close team supervision and 24 hour management of active medical problems listed below.  Physiatrist and rehab team continue to assess barriers to discharge/monitor patient progress toward functional and medical goals  Care Tool:  Bathing    Body parts bathed by patient: Right arm, Left arm, Chest, Abdomen, Front perineal area, Right upper leg, Left upper leg, Face, Buttocks   Body parts bathed by helper: Right lower leg, Left lower leg, Buttocks     Bathing assist Assist Level:  Contact Guard/Touching assist     Upper Body Dressing/Undressing Upper body dressing Upper body dressing/undressing activity did not occur (including orthotics): N/A What is the patient wearing?: Pull over shirt    Upper body assist Assist Level: Minimal Assistance - Patient > 75%    Lower Body Dressing/Undressing Lower body dressing    Lower body dressing activity did not occur: N/A What is the patient wearing?: Pants, Incontinence brief     Lower body assist Assist for lower body dressing: Minimal Assistance - Patient > 75%     Toileting Toileting    Toileting assist Assist for toileting: Minimal Assistance - Patient > 75%     Transfers Chair/bed transfer  Transfers assist  Chair/bed transfer activity did not occur: N/A  Chair/bed transfer assist level: Supervision/Verbal cueing     Locomotion Ambulation   Ambulation assist      Assist level: Contact Guard/Touching assist Assistive device: No Device Max distance: >150'   Walk 10 feet activity   Assist     Assist level: Contact Guard/Touching assist Assistive device: No Device   Walk 50 feet activity   Assist    Assist level: Contact Guard/Touching assist Assistive device: No Device    Walk 150 feet activity   Assist Walk 150 feet activity did not occur: Safety/medical concerns  Assist level: Contact Guard/Touching assist Assistive device: No Device    Walk 10 feet on uneven surface  activity   Assist Walk 10 feet on uneven surfaces activity did not occur: Safety/medical concerns         Wheelchair  Assist Will patient use wheelchair at discharge?: No (Per PT long term goals ) Type of Wheelchair: Manual    Wheelchair assist level: Moderate Assistance - Patient 50 - 74% Max wheelchair distance: 150    Wheelchair 50 feet with 2 turns activity    Assist        Assist Level: Moderate Assistance - Patient 50 - 74%   Wheelchair 150 feet activity      Assist      Assist Level: Moderate Assistance - Patient 50 - 74%   Blood pressure 114/82, pulse 80, temperature 97.8 F (36.6 C), resp. rate 17, height 5\' 8"  (1.727 m), weight 65.4 kg, SpO2 100 %.  Medical Problem List and Plan: 1.Decreased functional mobility with altered mental statussecondary to traumatic bilateral SDH/SAH. Status post right frontotemporoparietal craniectomy evacuation of acute subdural hematoma and insertion of right frontal ventriculostomy insertion of craniotomy flap into the abdominal subcutaneous tissue 09/05/2019. Helmet when out of bed.  Continue CIR  -soft helmet when OOB  -dc'ed c-collar 2. Antithrombotics: -DVT/anticoagulation:Lovenox 09/09/2019. Venous Doppler studies upper lower extremities negative except superficial cephalic vein thrombosis  CBC ordered for tomorrow -antiplatelet therapy: N/A 3. Pain Management:Lidoderm patch,Robaxin 500 mg every 8 hours and oxycodone as needed  -ice/heat to occipital area  -trial of topamax for headache, started on 7/22, increased on 7/26  Tylenol scheduled  Controlled on 7/26 per discussion with patient, however per nursing, patient endorses headaches 4. Mood:Provide emotional support -antipsychotic agents: N/A - cont sleep wake chart 5. Neuropsych: This patientis notcapable of making decisions on hisown behalf. 6. Skin/Wound Care:Routine skin checks.  -local care to abdomen and scalp 7. Fluids/Electrolytes/Nutrition:encourage PO  BMP within normal limits on 7/26 8. L1/2 right transverse process fractures. Conservative care 9. Urinary retention.     intermittently req I/O cath  - 7/21 ua negative, cx negative  Continue Urecholine  Voiding now 10. Dysphagia. Dysphagia #3 thin liquids.  11. Leukocytosis: Resolved 12. Thrombocytosis:   Platelets 696 on 7/19, continue to monitor 13.  Mild acute blood loss anemia  Hemoglobin 12.9 on 7/19,  labs ordered for tomorrow  LOS: 11 days A FACE TO FACE EVALUATION WAS PERFORMED  Azaliah Carrero [name] 10/01/2019, 1:17 PM

## 2019-10-01 NOTE — Progress Notes (Signed)
Occupational Therapy Session Note  Patient Details  Name: Jason Skinner MRN: [id number] Date of Birth: 1991/01/31  Today's Date: 10/01/2019 OT Individual Time: 1020-1110 OT Individual Time Calculation (min): 50 min  and Today's Date: 10/01/2019 OT Missed Time: 25 Minutes Missed Time Reason: Patient fatigue   Short Term Goals: Week 2:  OT Short Term Goal 1 (Week 2): STG=LTG d/t ELOS  Skilled Therapeutic Interventions/Progress Updates:    Family education session completed with pt and his brother and brother-in-law. Status interpretor used throughout session. Pt completed bed mobility with mod I to EOB. Pt donned helmet with min A to fasten. Pt's brother assisted in providing CGA for 200 ft of functional mobility- big improvement in endurance. Provided cueing for positioning and body mechanics. Pt completed tub transfer using TTB with CGA, following demonstration. Education provided re safety with transfers and ADLs, esp bathing/dressing seated. Reviewed education recommended for pt to obtain. Pt c/o fatigue and requested to return to room. Pt returned supine and closed eyes while family asked a few more questions, all of which were answered with use of interpretor. Reviewed visual deficits and use of occluded sunglasses, as well as overall TBI education and hypersensitivites. Pt was left supine with all needs met, bed alarm set. 25 min missed 2/2 pt fatigue.   Therapy Documentation Precautions:  Precautions Precautions: Fall Precaution Comments: bone flap in abdomen, helmet when OOB Required Braces or Orthoses: Cervical Brace, Other Brace Cervical Brace: Hard collar, At all times Other Brace: helmet for OOB  Restrictions Weight Bearing Restrictions: No   Therapy/Group: Individual Therapy  Curtis Sites 10/01/2019, 6:59 AM

## 2019-10-02 ENCOUNTER — Inpatient Hospital Stay (HOSPITAL_COMMUNITY): Payer: Self-pay | Admitting: Physical Therapy

## 2019-10-02 ENCOUNTER — Inpatient Hospital Stay (HOSPITAL_COMMUNITY): Payer: Self-pay

## 2019-10-02 ENCOUNTER — Inpatient Hospital Stay (HOSPITAL_COMMUNITY): Payer: Self-pay | Admitting: Speech Pathology

## 2019-10-02 LAB — CBC WITH DIFFERENTIAL/PLATELET
Abs Immature Granulocytes: 0.02 10*3/uL (ref 0.00–0.07)
Basophils Absolute: 0.1 10*3/uL (ref 0.0–0.1)
Basophils Relative: 1 %
Eosinophils Absolute: 0.3 10*3/uL (ref 0.0–0.5)
Eosinophils Relative: 4 %
HCT: 41.9 % (ref 39.0–52.0)
Hemoglobin: 13.9 g/dL (ref 13.0–17.0)
Immature Granulocytes: 0 %
Lymphocytes Relative: 43 %
Lymphs Abs: 3.2 10*3/uL (ref 0.7–4.0)
MCH: 31.4 pg (ref 26.0–34.0)
MCHC: 33.2 g/dL (ref 30.0–36.0)
MCV: 94.6 fL (ref 80.0–100.0)
Monocytes Absolute: 0.7 10*3/uL (ref 0.1–1.0)
Monocytes Relative: 9 %
Neutro Abs: 3.2 10*3/uL (ref 1.7–7.7)
Neutrophils Relative %: 43 %
Platelets: 376 10*3/uL (ref 150–400)
RBC: 4.43 MIL/uL (ref 4.22–5.81)
RDW: 14.4 % (ref 11.5–15.5)
WBC: 7.4 10*3/uL (ref 4.0–10.5)
nRBC: 0 % (ref 0.0–0.2)

## 2019-10-02 MED ORDER — TOPIRAMATE 25 MG PO TABS
75.0000 mg | ORAL_TABLET | Freq: Two times a day (BID) | ORAL | Status: DC
  Administered 2019-10-02 – 2019-10-05 (×6): 75 mg via ORAL
  Filled 2019-10-02 (×6): qty 3

## 2019-10-02 NOTE — Patient Care Conference (Signed)
Inpatient RehabilitationTeam Conference and Plan of Care Update Date: 10/02/2019   Time: 10:28 AM    Patient Name: Jason Skinner      Medical Record Number: [id number]  Date of Birth: 06-25-1990 Sex: Male         Room/Bed: 4W10C/4W10C-01 Payor Info: Payor: MEDICAID PENDING / Plan: MEDICAID PENDING / Product Type: *No Product type* /    Admit Date/Time:  09/20/2019  2:33 PM  Primary Diagnosis:  Traumatic subdural hematoma [facility])  Hospital Problems: Principal Problem:   Traumatic subdural hematoma (HCC) Active Problems:   Thrombocytosis ([place])   Urinary retention   Postoperative pain   Urinary incontinence   Dysphagia   Acute blood loss anemia   Vascular headache   Superficial venous thrombosis of arm, unspecified laterality    Expected Discharge Date: Expected Discharge Date: 10/05/19  Team Members Present: Physician leading conference: Dr. Leeroy Cha Care Coodinator Present: Loralee Pacas, LCSWA;Marybella Ethier Creig Hines, RN, BSN, [place] Nurse Present: Dwaine Gale, RN PT Present: Apolinar Junes, PT OT Present: Laverle Hobby, OT SLP Present: Jettie Booze, CF-SLP PPS Coordinator present : Ileana Ladd, Burna Mortimer, SLP     Current Status/Progress Goal Weekly Team Focus  Bowel/Bladder   continenet of bowel & bladder, LBM 09/30/19  remain continent  monitor & assist as needed   Swallow/Nutrition/ Hydration   Dys 3 textures, thin liquids, supervision  Mod I  possible trials of regular, independence with swallow strategies, education with family completed 7/26   ADL's   CGA ADLs overall, CGA transfers, diplopia still affecting mobility, frequent HA's, poor safety awareness, L inattention  Supervision, min A cognitive goals  Safety awareness/self awareness, ADL retraining, ADL transfers, family edu   Mobility   Supervision bed mobility and transfers, CGA-min A gait without AD, CGA 16 steps B rails  CGA-supervision overall  Balance, gait and stair training, functional mobility,  activity tolerance, patient/caregiver education, d/c planning   Communication             Safety/Cognition/ Behavioral Observations  Supervision-Min problem solving, attention, awareness, more Min-Mod recall still  Supervision  problem solving, detecting functional errors, recall strategies and awareness, education completed with family 7/26   Pain   consistent headaches, topamax was increased to 50 mg, has oxycodone, robaxin & tylenol for pain  pain scale <5/10  assess & treat as needed   Skin   abdominal incision from bone flap clean & dry with steristrips still intact, head incision dry, no drainage  no signs of infection  assess q shift     Team Discussion:  Discharge Planning/Teaching Needs:  Pt to have 24/7 care between brother, cousin Gershon Mussel and two roommates.  Family education as recommended by therapy. Fam edu completed on 7/26.   Current Update:  None  Current Barriers to Discharge:  Limited by headaches, fatigue, and diplopia.  Possible Resolutions to Barriers: Continue medication regimen, keep lights dim, and keep noise down.  Patient on target to meet rehab goals: yes, Continent B/B, Helmet OOB, Neck brace discontinued, walks better without the use of a walker. Recommending the use of contact guard to hand held assist.  *See Care Plan and progress notes for long and short-term goals.   Revisions to Treatment Plan:  none    Medical Summary Current Status: headache constant, pain at abdominal incision, fatigue, diplopia, crani and abdominal incisions C/D/I, soft diet Weekly Focus/Goal: Increase Topamax to 75mg  BID, educated patient and his family about prn pain medications, monitor incisions daily, family education  Barriers to  Discharge: Medical stability;Decreased family/caregiver support;Wound care  Barriers to Discharge Comments: Uninsured, headaches, pain at abdominal incision, need for wound care Possible Resolutions to Barriers: Family training of cousin and  brother, charity care for outpatient therapy, daily wound inspection and care   Continued Need for Acute Rehabilitation Level of Care: The patient requires daily medical management by a physician with specialized training in physical medicine and rehabilitation for the following reasons: Direction of a multidisciplinary physical rehabilitation program to maximize functional independence : Yes Medical management of patient stability for increased activity during participation in an intensive rehabilitation regime.: Yes Analysis of laboratory values and/or radiology reports with any subsequent need for medication adjustment and/or medical intervention. : Yes   I attest that I was present, lead the team conference, and concur with the assessment and plan of the team.   Cristi Loron 10/02/2019, 2:01 PM

## 2019-10-02 NOTE — Progress Notes (Signed)
Speech Language Pathology Daily Session Note  Patient Details  Name: Jason Skinner MRN: [id number] Date of Birth: 04/03/1990  Today's Date: 10/02/2019 SLP Individual Time: 0902-1000 SLP Individual Time Calculation (min): 58 min  Short Term Goals: Week 2: SLP Short Term Goal 1 (Week 2): STGs=LTGs due to ELOS  Skilled Therapeutic Interventions: Pt was seen for skilled ST targeting cognitive goals. He recreated PEG board designs ranging some simple to complex with overall Min A verbal cues for error awareness, Supervision A verbal cues for problem solving their corrections. Pt also required Min A verbal and visual cues for recall of familiar locations/items when navigating around the unit. He recalled his room number, however cues required for him to functionally identify is during scavenger hunt. Pt also required cues for safety awareness with use of walker when transferring from bed to chair and chair to bed. Pt left laying in bed with alarm set and needs within reach. Continue per current plan of care.           Pain Pain Assessment Pain Scale: Faces Faces Pain Scale: No hurt   Therapy/Group: Individual Therapy  Arbutus Leas 10/02/2019, 10:14 AM

## 2019-10-02 NOTE — Progress Notes (Signed)
Physical Therapy Session Note  Patient Details  Name: Jason Skinner MRN: [id number] Date of Birth: [birth year]  Today's Date: 10/02/2019 PT Individual Time: 0600-4599 PT Individual Time Calculation (min): 60 min   Short Term Goals: Week 2:  PT Short Term Goal 1 (Week 2): STG=LTG due to ELOS  Skilled Therapeutic Interventions/Progress Updates: Pt presents sidelying in bed, agreeable to therapy, although c/o 8/10 pain.  Pt transfers sup to sit w/ supervision and cues for speed.  Pt educated on need for use of cervical collar and helmet when OOB, through interpreter who was present throughout session.  Pt required assist to don collar and able to place helmet w/ assist for strap.  Pt transferred sit to stand multiple trials from various surfaces w/ supervision w/ verbal cues for hand placement as pt reaches for external support.  Pt c/o need for glasses d/t HA.  Pt amb in room w/ CGA and verbal cues for safety.  Pt educated on need to pause before beginning amb for safety.  Pt demonstrates on subsequent trials.  Pt amb multiple trials w/o AD and CGA up to 120' including turns to return to room while waiting for RN for pain meds.  Pt amb up to 150' including turns and verbal cues for attention to right.  Pt exhibits decreased BOS, scissoring when turning to left.  Pt negotiated multiple obstacle courses around cones, but difficulties d/t vision w/ collar.  Multiple LOB w/ sharper turns, requiring PT assist to maintain balance.  Pt requested return to room and to lie down d/t c/o HA pain.  Pt transfers sit to supine w/ supervision after doffing mask, glasses and helmet and attempting to don cervical collar, min A from PT.  Bed alarm on and needs in reach.  Spoke w/ RN and she will check to see if any meds may be given for pain, as he is not due for anything at this time.     Therapy Documentation Precautions:  Precautions Precautions: Fall Precaution Comments: bone flap in abdomen, helmet when OOB Required  Braces or Orthoses: Cervical Brace, Other Brace Cervical Brace: Hard collar, At all times Other Brace: helmet for OOB  Restrictions Weight Bearing Restrictions: No General: PT Amount of Missed Time (min): 15 Minutes PT Missed Treatment Reason: Pain Vital Signs:  Pain:8/10 HA, already received pain meds, nursing to review for further pain meds. Pain Assessment Pain Scale: 0-10 Pain Score: 7  Pain Type: Acute pain Pain Location: Head Pain Descriptors / Indicators: Aching Pain Onset: On-going Pain Intervention(s): Medication (See eMAR) Mobility:      Therapy/Group: Individual Therapy  [name] 10/02/2019, 3:23 PM

## 2019-10-02 NOTE — Plan of Care (Signed)
  Problem: Consults Goal: RH BRAIN INJURY PATIENT EDUCATION Description: Description: See Patient Education module for eduction specifics Outcome: Progressing Goal: Skin Care Protocol Initiated - if Braden Score 18 or less Description: If consults are not indicated, leave blank or document N/A Outcome: Progressing Goal: Nutrition Consult-if indicated Outcome: Progressing   Problem: RH SKIN INTEGRITY Goal: RH STG SKIN FREE OF INFECTION/BREAKDOWN Description: Min assist to prevent breakdown and reduce risk of infection   Outcome: Progressing Goal: RH STG MAINTAIN SKIN INTEGRITY WITH ASSISTANCE Description: STG Maintain Skin Integrity With min Assistance. Outcome: Progressing   Problem: RH SAFETY Goal: RH STG ADHERE TO SAFETY PRECAUTIONS W/ASSISTANCE/DEVICE Description: STG Adhere to Safety Precautions With cues/reminders Assistance/Device. Outcome: Progressing   Problem: RH PAIN MANAGEMENT Goal: RH STG PAIN MANAGED AT OR BELOW PT'S PAIN GOAL Description: Less than 4  Outcome: Progressing   Problem: RH KNOWLEDGE DEFICIT BRAIN INJURY Goal: RH STG INCREASE KNOWLEDGE OF SELF CARE AFTER BRAIN INJURY Description: Pt/family will be able to verbalize fall precautions and safety ideas at home as well as [place] levels of TBI using handouts/teaching using interpreter with min assist. Outcome: Progressing   Problem: RH Vision Goal: RH LTG Vision (Specify) Outcome: Progressing

## 2019-10-02 NOTE — Progress Notes (Signed)
[place] PHYSICAL MEDICINE & REHABILITATION PROGRESS NOTE   Subjective/Complaints: Continues to have headaches and difficulty sleeping because of these.  Labs perfect today Vitals stable. Moving bowels regularly.   ROS: Limited due to cognition/language  Objective:   No results found. Recent Labs    10/02/19 0452  WBC 7.4  HGB 13.9  HCT 41.9  PLT 376   Recent Labs    10/01/19 0626  NA 139  K 3.6  CL 104  CO2 26  GLUCOSE 94  BUN 13  CREATININE 0.79  CALCIUM 9.8    Intake/Output Summary (Last 24 hours) at 10/02/2019 0843 Last data filed at 10/01/2019 2348 Gross per 24 hour  Intake 480 ml  Output 1250 ml  Net -770 ml     Physical Exam: Vital Signs Blood pressure (!) 110/89, pulse 68, temperature 99.2 F (37.3 C), temperature source Oral, resp. rate 17, height 5\' 8"  (1.727 m), weight 65.4 kg, SpO2 100 %. Constitutional: No distress . Vital signs reviewed. HENT: Craniectomy DCI Eyes: Right eye with limitations in left gaze. No discharge. Cardiovascular: No JVD. Respiratory: Normal effort.  No stridor. GI: Non-distended. Skin: Scalp/abdomen CDI Psych: He is calm. Denies depression Musc: No edema in extremities.  No tenderness in extremities. Neuro: Alert Motor: Grossly 4+/5 throughout, appears unchanged  Assessment/Plan: 1. 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  Physiatrist is providing close team supervision and 24 hour management of active medical problems listed below.  Physiatrist and rehab team continue to assess barriers to discharge/monitor patient progress toward functional and medical goals  Care Tool:  Bathing    Body parts bathed by patient: Right arm, Left arm, Chest, Abdomen, Front perineal area, Right upper leg, Left upper leg, Face, Buttocks   Body parts bathed by helper: Right lower leg, Left lower leg, Buttocks      Bathing assist Assist Level: Contact Guard/Touching assist     Upper Body Dressing/Undressing Upper body dressing Upper body dressing/undressing activity did not occur (including orthotics): N/A What is the patient wearing?: Pull over shirt    Upper body assist Assist Level: Minimal Assistance - Patient > 75%    Lower Body Dressing/Undressing Lower body dressing    Lower body dressing activity did not occur: N/A What is the patient wearing?: Pants, Incontinence brief     Lower body assist Assist for lower body dressing: Minimal Assistance - Patient > 75%     Toileting Toileting    Toileting assist Assist for toileting: Minimal Assistance - Patient > 75%     Transfers Chair/bed transfer  Transfers assist  Chair/bed transfer activity did not occur: N/A  Chair/bed transfer assist level: Supervision/Verbal cueing     Locomotion Ambulation   Ambulation assist      Assist level: Contact Guard/Touching assist Assistive device: No Device Max distance: >150'   Walk 10 feet activity   Assist     Assist level: Contact Guard/Touching assist Assistive device: No Device   Walk 50 feet activity   Assist    Assist level: Contact Guard/Touching assist Assistive device: No Device    Walk 150 feet activity   Assist Walk 150 feet activity did not occur: Safety/medical concerns  Assist level: Contact Guard/Touching assist Assistive device: No Device    Walk 10 feet on uneven surface  activity   Assist Walk 10 feet on uneven surfaces activity did not occur: Safety/medical concerns  Wheelchair     Assist Will patient use wheelchair at discharge?: No (Per PT long term goals ) Type of Wheelchair: Manual    Wheelchair assist level: Moderate Assistance - Patient 50 - 74% Max wheelchair distance: 150    Wheelchair 50 feet with 2 turns activity    Assist        Assist Level: Moderate Assistance - Patient 50 - 74%   Wheelchair  150 feet activity     Assist      Assist Level: Moderate Assistance - Patient 50 - 74%   Blood pressure (!) 110/89, pulse 68, temperature 99.2 F (37.3 C), temperature source Oral, resp. rate 17, height 5\' 8"  (1.727 m), weight 65.4 kg, SpO2 100 %.  Medical Problem List and Plan: 1.Decreased functional mobility with altered mental statussecondary to traumatic bilateral SDH/SAH. Status post right frontotemporoparietal craniectomy evacuation of acute subdural hematoma and insertion of right frontal ventriculostomy insertion of craniotomy flap into the abdominal subcutaneous tissue 09/05/2019. Helmet when out of bed.  Continue CIR  -soft helmet when OOB  -dc'ed c-collar 2. Antithrombotics: -DVT/anticoagulation:Lovenox 09/09/2019. Venous Doppler studies upper lower extremities negative except superficial cephalic vein thrombosis CBC stable. -antiplatelet therapy: N/A 3. Pain Management:Lidoderm patch,Robaxin 500 mg every 8 hours and oxycodone as needed  -ice/heat to occipital area  -trial of topamax for headache, started on 7/22, increased on 7/26  Tylenol scheduled  7/27: increase topamax to 75mg  BID 4. Mood:Provide emotional support -antipsychotic agents: N/A - cont sleep wake chart 5. Neuropsych: This patientis notcapable of making decisions on hisown behalf. 6. Skin/Wound Care:Routine skin checks.  -local care to abdomen and scalp 7. Fluids/Electrolytes/Nutrition:encourage PO  BMP normal 7/27 8. L1/2 right transverse process fractures. Conservative care 9. Urinary retention.     intermittently req I/O cath  - 7/21 ua negative, cx negative  Continue Urecholine  Voiding now 10. Dysphagia. Dysphagia #3 thin liquids.  11. Leukocytosis: Resolved 12. Thrombocytosis:   Platelets 696 on 7/19, stable 7/27 13.  Mild acute blood loss anemia  Hemoglobin 12.9 on 7/19, stable 7/27  LOS: 12 days A FACE TO [place] Sakiya Stepka 10/02/2019, 8:43 AM

## 2019-10-02 NOTE — Progress Notes (Signed)
Occupational Therapy Session Note  Patient Details  Name: Jason Skinner MRN: [id number] Date of Birth: 01-28-1991  Today's Date: 10/02/2019 OT Individual Time: 1100-1145 OT Individual Time Calculation (min): 45 min    Short Term Goals: Week 2:  OT Short Term Goal 1 (Week 2): STG=LTG d/t ELOS  Skilled Therapeutic Interventions/Progress Updates:    Pt received supine with brother present and interpretor present throughout session.Pt c/o 5/10 HA and RN asked about additional pain medication- none available for another hour. Pt initially declining shower but then agreeable after discussion re importance of participation in everyday ADLs for recovery. Pt completed ambulatory transfer with CGA, requiring min cues to don helmet before standing. Min cueing for sequencing steps of undressing sit <> stand. Pt washed UB/LB with (S) and cueing required to use soap and to thoroughly wash. Pt's brother observing and receptive to education throughout. Pt returned to EOB and donned UB clothes with set up assist, (S) for pants. Pt took a supine rest break with eyes closed 2/2 c/o HA. Pt completed 200 ft of functional mobility with CGA. Poor awareness to L side of environment, requiring mod cueing to locate his room. Pt was left supine with all needs met, bed alarm set.   Therapy Documentation Precautions:  Precautions Precautions: Fall Precaution Comments: bone flap in abdomen, helmet when OOB Required Braces or Orthoses: Cervical Brace, Other Brace Cervical Brace: Hard collar, At all times Other Brace: helmet for OOB  Restrictions Weight Bearing Restrictions: No   Therapy/Group: Individual Therapy  Curtis Sites 10/02/2019, 6:34 AM

## 2019-10-03 ENCOUNTER — Inpatient Hospital Stay (HOSPITAL_COMMUNITY): Payer: Self-pay | Admitting: Physical Therapy

## 2019-10-03 ENCOUNTER — Inpatient Hospital Stay (HOSPITAL_COMMUNITY): Payer: Self-pay

## 2019-10-03 MED ORDER — METHOCARBAMOL 500 MG PO TABS
500.0000 mg | ORAL_TABLET | Freq: Three times a day (TID) | ORAL | Status: DC | PRN
  Administered 2019-10-03 – 2019-10-05 (×5): 500 mg via ORAL
  Filled 2019-10-03 (×6): qty 1

## 2019-10-03 NOTE — Progress Notes (Signed)
Physical Therapy Session Note  Patient Details  Name: Jason Skinner MRN: [id number] Date of Birth: 1990/07/20  Today's Date: 10/03/2019 PT Individual Time: 8412-8208 PT Individual Time Calculation (min): 54 min   Short Term Goals: Week 2:  PT Short Term Goal 1 (Week 2): STG=LTG due to ELOS  Skilled Therapeutic Interventions/Progress Updates: Pt presents supine in bed and agreeable to therapy.  Pt states only "little" pain, no interpreter at this time.  Pt transfers sup to sit w/ supervision.  Pt dons glasses, mask and helmet w/ increased time.  Pt amb multiple trials w/o AD and CGA.  Pt w/ improved balance noted, especially through turns.  Pt amb > 150' per trial.  Pt amb through multiple obstacle courses including stepping around cones, over canes, stepping on 6" platform.  Pt performed toe-taps 3 x 10 to 6" platform w/ CGA.  Pt required multiple seated rest breaks c/o fatigue.  Pt amb back to room and transferred sit to supine in bed w/ supervision after doffing helmet and mask.  Bed alarm on and needs in reach.       Therapy Documentation Precautions:  Precautions Precautions: Fall Precaution Comments: bone flap in abdomen, helmet when OOB Required Braces or Orthoses: Cervical Brace, Other Brace Cervical Brace: Hard collar, At all times Other Brace: helmet for OOB  Restrictions Weight Bearing Restrictions: No General:   Vital Signs:   Pain:pt states "poquito" but no quantified number. Pain Assessment Pain Scale: 0-10 Pain Score: 7  Pain Type: Acute pain;Surgical pain Pain Location: Head Pain Orientation: Left;Right Pain Descriptors / Indicators: Headache Pain Frequency: Constant Pain Onset: On-going Patients Stated Pain Goal: 3 Pain Intervention(s): Medication (See eMAR) Mobility:   Other Treatments:      Therapy/Group: Individual Therapy  [name] 10/03/2019, 10:00 AM

## 2019-10-03 NOTE — Discharge Instructions (Signed)
Inpatient Rehab Discharge Instructions  Jason Skinner Discharge date and time: No discharge date for patient encounter.   Activities/Precautions/ Functional Status: Activity: activity as tolerated Diet: soft Wound Care: Keep wound clean and dry Functional status:  ___ No restrictions     ___ Walk up steps independently ___ 24/7 supervision/assistance   ___ Walk up steps with assistance ___ Intermittent supervision/assistance  ___ Bathe/dress independently ___ Walk with walker     _x__ Bathe/dress with assistance ___ Walk Independently    ___ Shower independently ___ Walk with assistance    ___ Shower with assistance ___ No alcohol     ___ Return to work/school ________  COMMUNITY REFERRALS UPON DISCHARGE:     Outpatient: PT   OT    ST             Agency: [facility] Phone: [phone]             Appointment Date/Time: *Please expect follow-up within 7-10 business days to discuss appointment. If you have not received follow-up, be sure to contact site directly.*  Medical Equipment/Items Ordered:                                                  Agency/Supplier: family to purchase  GENERAL COMMUNITY RESOURCES FOR PATIENT/FAMILY:  New patient appointment on Thursday, August 5th at 1:20pm with Dionisio David, Hyrum [place] [address] [place] [address] [phone] *Please be sure to discuss financial assistance and orange card at time of appointment.*  Special Instructions: No driving smoking or alcohol  Follow-up with neurosurgery Dr. Arnoldo Morale [phone] in regards to planned cranioplasty  Continue helmet as directed   My questions have been answered and I understand these instructions. I will adhere to these goals and the provided educational materials after my discharge from the hospital.  Patient/Caregiver Signature _______________________________ Date __________  Clinician Signature _______________________________________ Date  __________  Please bring this form and your medication list with you to all your follow-up doctor's appointments.

## 2019-10-03 NOTE — Progress Notes (Signed)
Occupational Therapy Session Note  Patient Details  Name: Laterrian Hevener MRN: [id number] Date of Birth: Jun 25, 1990  Today's Date: 10/03/2019 OT Individual Time: 1315-1430 OT Individual Time Calculation (min): 75 min    Short Term Goals: Week 1:  OT Short Term Goal 1 (Week 1): Pt will transfer to toilet with CGA and LRAD OT Short Term Goal 1 - Progress (Week 1): Met OT Short Term Goal 2 (Week 1): Pt will thread BLE into pants OT Short Term Goal 2 - Progress (Week 1): Met OT Short Term Goal 3 (Week 1): Pt will complete UB dressing with S OT Short Term Goal 3 - Progress (Week 1): Met OT Short Term Goal 4 (Week 1): Pt will groom standing at sink to demo improved endurance OT Short Term Goal 4 - Progress (Week 1): Met  Skilled Therapeutic Interventions/Progress Updates:    1;1. Pt received in bed agreeable to OT and at end of session reporting HA (RN alerted and respotioned for comfort). Pt completes all mobility with RW and 1 instance of CGA for L LOB. Pt demo poor awareness of L space and requries frequent cuing to navigate RW through environment and pausing for safety when Rw runs into something. Pt doffs clothing seated on BSC in bathroom with no VC, bathes with VC for applying soap to washcloth and terminating bathing after washing 11/11 body parts 3x, and dons clothing with VC for L attention/locating items on L side at sit to stand level. Pt amublates in hallway for visual scanning activity locating numbers 1-6 in reverse order. Pt continues to demo poor visual scanning pattern requring VC for scanning to far L and lower towards floor. Pt able to retrieve item off floor with S and use of rail to steady self. Pt reporting dizziness during activity and requries supine rest break and BP assessed WNL. Pt able to finish scanning activity with increased VC as stated above before terimnating session and returning to room with call light in reach, bed alarm set and all needs met  Therapy  Documentation Precautions:  Precautions Precautions: Fall Precaution Comments: bone flap in abdomen, helmet when OOB Required Braces or Orthoses: Cervical Brace, Other Brace Cervical Brace: Hard collar, At all times Other Brace: helmet for OOB  Restrictions Weight Bearing Restrictions: No General:   Vital Signs: Therapy Vitals Pulse Rate: 73 Resp: 16 BP: (!) 98/64 Patient Position (if appropriate): Lying Oxygen Therapy SpO2: 100 % O2 Device: Room Air Pain:   ADL: ADL Upper Body Bathing: Minimal cueing, Supervision/safety Where Assessed-Upper Body Bathing: Sitting at sink Lower Body Bathing: Maximal assistance Where Assessed-Lower Body Bathing: Sitting at sink, Standing at sink Upper Body Dressing: Minimal assistance Where Assessed-Upper Body Dressing: Sitting at sink Lower Body Dressing: Maximal assistance Where Assessed-Lower Body Dressing: Sitting at sink, Standing at sink Toileting: Minimal assistance Where Assessed-Toileting: Glass blower/designer: Psychiatric nurse Method: Stand pivot Vision   Perception    Praxis   Exercises:   Other Treatments:     Therapy/Group: Individual Therapy  Tonny Branch 10/03/2019, 3:26 PM

## 2019-10-03 NOTE — Progress Notes (Signed)
Speech Language Pathology Discharge Summary  Patient Details  Name: Jason Skinner MRN: [id number] Date of Birth: [birth year]  Today's Date: 10/04/2019 SLP Individual Time: 1100-1145 SLP Individual Time Calculation (min): 45 min   Skilled Therapeutic Interventions:  Pt was seen for skilled ST targeting cognitive goals. Pt's family member present at bedside for session and general questions answered regarding cognitive general neuro rehab process/timeline. He also had questions about medications, which were deferred to RN. SLP re-administered SLUMS cognitive evaluation, which pt received a score of 21/30 - same as day of admission and indicative of mild neurocognitive disorder. Although his score did not change, he did demonstrate improvements on delayed recall task, basic problem solving skills, and overall processing speed with less cues in comparison to day of initial evaluation. Pt with severe headache throughout session and when testing was complete he reported he could no longer participate and needed to rest dye to headache pain. RN aware of pain and reported pt had already received medication. Therefore, session was ended 15 minutes early. Pt left laying in bed with alarm set and needs within reach, family still present and all questions answered to their satisfaction. Continue per current plan of care.   Patient has met 4 of 6 long term goals.  Patient to discharge at overall Supervision;Min level.  Reasons goals not met: requires increased cues for complex problem solving and recall/carryover of new complex daily information   Clinical Impression/Discharge Summary:   Pt made functional gains and met 4 out of 6 long term goals this admission. Pt currently requires Supervision assist for basic familiar cognitive tasks, as well as selective attention and intellectual awareness. However, increased Min A cueing is required for complex problem solving and carryover of safety awareness and other complex  daily information. As a result of cognitive impairments impacting his problem solving, awareness, and most remarkably his short term memory, he will require 24/7 supervision at discharge. Pt is consuming dysphagia 3 (mechanical soft) diet with thin liquids and is Mod I for use of safe swallow strategies and precautions. He was not advanced to regular textures due to pt's reports of pain with mastication of regular textures. Pt has demonstrated improved use of compensatory memory strategies, basic and mildly complex problem solving, awareness of current deficits, and selective attention to tasks. However, given cognitive deficits still present and with an impact on his daily living, recommend pt continue to receive skilled ST services upon discharge. Pt and family education is complete at this time.   Care Partner:  Caregiver Able to Provide Assistance: Yes  Type of Caregiver Assistance: Cognitive  Recommendation:  24 hour supervision/assistance;Home Health SLP  Rationale for SLP Follow Up: Reduce caregiver burden;Maximize cognitive function and independence   Equipment: none   Reasons for discharge: Discharged from hospital   Patient/Family Agrees with Progress Made and Goals Achieved: Yes    Arbutus Leas 10/04/2019, 11:52 AM

## 2019-10-03 NOTE — Discharge Summary (Signed)
Physician Discharge Summary  Patient ID: Jason Skinner MRN: [id number] DOB/AGE: 1990-07-22 29 y.o.  Admit date: 09/20/2019 Discharge date:   Discharge Diagnoses:  Principal Problem:   Traumatic subdural hematoma [facility]) Active Problems:   Thrombocytosis (HCC)   Urinary retention   Postoperative pain   Urinary incontinence   Dysphagia   Acute blood loss anemia   Vascular headache   Superficial venous thrombosis of arm, unspecified laterality DVT prophylaxis  Discharged Condition: Stable  Significant Diagnostic Studies: DG ELBOW COMPLETE RIGHT (3+VIEW)  Result Date: 09/05/2019 CLINICAL DATA:  Pain status post fall EXAM: RIGHT ELBOW - COMPLETE 3+ VIEW COMPARISON:  None. FINDINGS: There is no evidence of fracture, dislocation, or joint effusion. There is no evidence of arthropathy or other focal bone abnormality. Soft tissues are unremarkable. IMPRESSION: Negative. Electronically Signed   By: Constance Holster M.D.   On: 09/05/2019 14:53   DG Abd 1 View  Result Date: 09/05/2019 CLINICAL DATA:  Pain. EXAM: ABDOMEN - 1 VIEW COMPARISON:  CT from the same day FINDINGS: The enteric tube projects over the stomach. The visualized bowel gas pattern is unremarkable. IMPRESSION: Enteric tube projects over the stomach. Electronically Signed   By: Constance Holster M.D.   On: 09/05/2019 14:54   CT HEAD WO CONTRAST  Result Date: 09/06/2019 CLINICAL DATA:  Subdural hematoma. EXAM: CT HEAD WITHOUT CONTRAST TECHNIQUE: Contiguous axial images were obtained from the base of the skull through the vertex without intravenous contrast. COMPARISON:  Head CT 09/05/2019 FINDINGS: Brain: There postoperative changes from interval right-sided craniotomy for right subdural hematoma evacuation. There is decreased mass effect, now measuring 5 mm (previously 20 mm). Persistent although decreased leftward subfalcine herniation. There is persistent hyperdense extra-axial hemorrhage overlying the right cerebral convexity at  the craniotomy site which focally measures up to 2.3 cm in thickness along the course of a right subdural drain (series 5, image 39). There is also a small amount of extra-axial pneumocephalus adjacent to the craniotomy site. Persistent small-bowel subdural hemorrhage along the falx. There has been interval blooming of hemorrhagic contusion within the right greater than left anterior frontal lobes and anterior left temporal lobe, again with overlying acute subarachnoid hemorrhage. A component of the extra-axial hemorrhage overlying the frontal pole may be subdural. There has been interval placement of a right frontal approach ventricular drain which terminates within the anterior left lateral ventricle. Trace hemorrhage within the atrium of the left lateral ventricle. No evidence of hydrocephalus. There is a small focus of pneumocephalus within the left lateral ventricle frontal horn. Persistent although decreased effacement of the right lateral ventral Vascular: No hyperdense vessel. Skull: Right-sided craniotomy. Redemonstrated midline fracture involving the occipital calvarium and clivus with extension into the right sphenoid sinus. Sinuses/Orbits: Visualized orbits show no acute finding. Near complete opacification of the sphenoid sinuses. Air-fluid level within the right maxillary sinus. No significant mastoid effusion. Partially visualized support tubes. Other: Scalp edema overlying the right-sided craniotomy site with associated scalp staples. IMPRESSION: IMPRESSION 1. Postoperative changes from interval right-sided craniotomy for right subdural hematoma evacuation. 2. Decreased mass effect with now 5 mm leftward midline shift (previously 20 mm). Persistent although decreased leftward subfalcine herniation. 3. There is some persistent extra-axial hemorrhage overlying the right cerebral convexity at the craniotomy site which focally measures up to 2.3 cm in thickness along the course of a subdural drain.  Additionally, there is persistent small volume subdural hemorrhage along the falx. 4. Interval blooming of hemorrhagic contusion involving the right greater than left anterior frontal  lobes and left temporal lobe with redemonstrated overlying acute subarachnoid hemorrhage. 5. Interval placement of a right frontal approach ventricular drain terminating within the anterior left lateral ventricle. No evidence of hydrocephalus. Trace hemorrhage within the atrium of the left lateral ventricle. Electronically Signed   By: Kellie Simmering DO   On: 09/06/2019 07:39   CT HEAD WO CONTRAST  Result Date: 09/05/2019 CLINICAL DATA:  Level 1 trauma EXAM: CT HEAD WITHOUT CONTRAST CT MAXILLOFACIAL WITHOUT CONTRAST CT CERVICAL SPINE WITHOUT CONTRAST TECHNIQUE: Multidetector CT imaging of the head, cervical spine, and maxillofacial structures were performed using the standard protocol without intravenous contrast. Multiplanar CT image reconstructions of the cervical spine and maxillofacial structures were also generated. COMPARISON:  None. FINDINGS: CT HEAD FINDINGS Brain: Mixed density subdural hematoma along the right cerebral convexity and left frontal pole. Mixed density could be from a continued and un clotted bleeding, especially on the right. The right-sided hematoma measures up to 15 mm in thickness. Midline shift measures up to 2 cm. Pneumocephalus with intraventricular extension. Right uncal herniation. Mix of contusion and subarachnoid hemorrhage over the left temporal pole which is edematous. No visible infarct. Vascular: Negative Skull: Midline skull fracture involving the occiput and clivus. There is extension into the right sphenoid sinus, which accounts for pneumocephalus and sinus opacification. Posterior scalp contusion CT MAXILLOFACIAL FINDINGS Osseous: Intact and located mandible. Midline clivus fracture extending to the right sphenoid sinus. Orbits: No evidence of injury. Sinuses: Right sphenoid and maxillary  hemosinus Soft tissues: Negative CT CERVICAL SPINE FINDINGS Alignment: Normal Skull base and vertebrae: Clival fracture. No cervical spine fracture. Soft tissues and spinal canal: Gas present around the dens. The tectorial membrane is continuous and visible. No visible canal hematoma Disc levels:  No degenerative changes or visible impingement. Upper chest: Reported separately Critical Value/emergent results were discussed in person on 09/05/2019 at 11:35 am with Dr Bobbye Morton. IMPRESSION: Head CT: 1. Right more than left cerebral convexity subdural hematoma with leftward midline shift of 2 cm and right uncal herniation. 2. Left temporal pole contusion with overlying subarachnoid hemorrhage. 3. Occipital and clival midline fractures extending into the right sphenoid sinus with pneumocephalus. Cervical spine CT: Gas around the dens presumably related to the skull base fracture. Craniocervical alignment is normal and the tectorial membrane is visible. Alar or other ligamentous injury is still conceivable. Maxillofacial CT: Clival fracture extending into the right sphenoid sinus with hemosinus and pneumocephalus. Electronically Signed   By: Monte Fantasia M.D.   On: 09/05/2019 11:40   CT CHEST W CONTRAST  Result Date: 09/05/2019 CLINICAL DATA:  Moderate to severe chest trauma EXAM: CT CHEST, ABDOMEN, AND PELVIS WITH CONTRAST TECHNIQUE: Multidetector CT imaging of the chest, abdomen and pelvis was performed following the standard protocol during bolus administration of intravenous contrast. CONTRAST:  Dose is currently not available COMPARISON:  None. FINDINGS: CT CHEST FINDINGS Cardiovascular: Normal heart size. No pericardial effusion. No evidence of great vessel injury. Mediastinum/Nodes: No hematoma or pneumomediastinum. There is a coarse calcification in the right lobe of the thyroid which is asymmetrically larger with a 2 cm nodular projection extending inferiorly that is isodense to the remaining gland.  Lungs/Pleura: No hemothorax, pneumothorax, or lung contusion. Dependent opacity with volume loss is likely atelectasis and aspiration. Musculoskeletal: No acute finding CT ABDOMEN PELVIS FINDINGS Hepatobiliary: No hepatic injury or perihepatic hematoma. Gallbladder is unremarkable Pancreas: Negative Spleen: Left upper quadrant poly splenia or localize splenosis. No acute injury. Adrenals/Urinary Tract: No adrenal hemorrhage or renal injury identified.  Bladder is unremarkable. Stomach/Bowel: No evidence of injury Vascular/Lymphatic: No acute vascular finding Reproductive: Negative Other: No ascites or pneumoperitoneum. Musculoskeletal: L1 and L2 right transverse process fractures. Vacuum phenomenon around the sacroiliac joints without diastasis IMPRESSION: 1. L1 and L2 right transverse process fractures. 2. Bilateral dependent atelectasis and or aspiration. 3. 2 cm nodular projection from the right thyroid lobe. After convalescence, recommend thyroid US(Ref: J Am Coll Radiol. 2015 Feb;12(2): 143-50). Electronically Signed   By: Monte Fantasia M.D.   On: 09/05/2019 11:45   CT CERVICAL SPINE WO CONTRAST  Result Date: 09/05/2019 CLINICAL DATA:  Level 1 trauma EXAM: CT HEAD WITHOUT CONTRAST CT MAXILLOFACIAL WITHOUT CONTRAST CT CERVICAL SPINE WITHOUT CONTRAST TECHNIQUE: Multidetector CT imaging of the head, cervical spine, and maxillofacial structures were performed using the standard protocol without intravenous contrast. Multiplanar CT image reconstructions of the cervical spine and maxillofacial structures were also generated. COMPARISON:  None. FINDINGS: CT HEAD FINDINGS Brain: Mixed density subdural hematoma along the right cerebral convexity and left frontal pole. Mixed density could be from a continued and un clotted bleeding, especially on the right. The right-sided hematoma measures up to 15 mm in thickness. Midline shift measures up to 2 cm. Pneumocephalus with intraventricular extension. Right uncal  herniation. Mix of contusion and subarachnoid hemorrhage over the left temporal pole which is edematous. No visible infarct. Vascular: Negative Skull: Midline skull fracture involving the occiput and clivus. There is extension into the right sphenoid sinus, which accounts for pneumocephalus and sinus opacification. Posterior scalp contusion CT MAXILLOFACIAL FINDINGS Osseous: Intact and located mandible. Midline clivus fracture extending to the right sphenoid sinus. Orbits: No evidence of injury. Sinuses: Right sphenoid and maxillary hemosinus Soft tissues: Negative CT CERVICAL SPINE FINDINGS Alignment: Normal Skull base and vertebrae: Clival fracture. No cervical spine fracture. Soft tissues and spinal canal: Gas present around the dens. The tectorial membrane is continuous and visible. No visible canal hematoma Disc levels:  No degenerative changes or visible impingement. Upper chest: Reported separately Critical Value/emergent results were discussed in person on 09/05/2019 at 11:35 am with Dr Bobbye Morton. IMPRESSION: Head CT: 1. Right more than left cerebral convexity subdural hematoma with leftward midline shift of 2 cm and right uncal herniation. 2. Left temporal pole contusion with overlying subarachnoid hemorrhage. 3. Occipital and clival midline fractures extending into the right sphenoid sinus with pneumocephalus. Cervical spine CT: Gas around the dens presumably related to the skull base fracture. Craniocervical alignment is normal and the tectorial membrane is visible. Alar or other ligamentous injury is still conceivable. Maxillofacial CT: Clival fracture extending into the right sphenoid sinus with hemosinus and pneumocephalus. Electronically Signed   By: Monte Fantasia M.D.   On: 09/05/2019 11:40   CT ABDOMEN PELVIS W CONTRAST  Result Date: 09/05/2019 CLINICAL DATA:  Moderate to severe chest trauma EXAM: CT CHEST, ABDOMEN, AND PELVIS WITH CONTRAST TECHNIQUE: Multidetector CT imaging of the chest, abdomen  and pelvis was performed following the standard protocol during bolus administration of intravenous contrast. CONTRAST:  Dose is currently not available COMPARISON:  None. FINDINGS: CT CHEST FINDINGS Cardiovascular: Normal heart size. No pericardial effusion. No evidence of great vessel injury. Mediastinum/Nodes: No hematoma or pneumomediastinum. There is a coarse calcification in the right lobe of the thyroid which is asymmetrically larger with a 2 cm nodular projection extending inferiorly that is isodense to the remaining gland. Lungs/Pleura: No hemothorax, pneumothorax, or lung contusion. Dependent opacity with volume loss is likely atelectasis and aspiration. Musculoskeletal: No acute finding CT ABDOMEN PELVIS  FINDINGS Hepatobiliary: No hepatic injury or perihepatic hematoma. Gallbladder is unremarkable Pancreas: Negative Spleen: Left upper quadrant poly splenia or localize splenosis. No acute injury. Adrenals/Urinary Tract: No adrenal hemorrhage or renal injury identified. Bladder is unremarkable. Stomach/Bowel: No evidence of injury Vascular/Lymphatic: No acute vascular finding Reproductive: Negative Other: No ascites or pneumoperitoneum. Musculoskeletal: L1 and L2 right transverse process fractures. Vacuum phenomenon around the sacroiliac joints without diastasis IMPRESSION: 1. L1 and L2 right transverse process fractures. 2. Bilateral dependent atelectasis and or aspiration. 3. 2 cm nodular projection from the right thyroid lobe. After convalescence, recommend thyroid US(Ref: J Am Coll Radiol. 2015 Feb;12(2): 143-50). Electronically Signed   By: Monte Fantasia M.D.   On: 09/05/2019 11:45   DG Pelvis Portable  Result Date: 09/05/2019 CLINICAL DATA:  Level 1 trauma. Fell 20 feet. EXAM: PORTABLE PELVIS 1-2 VIEWS COMPARISON:  None. FINDINGS: Both hips are normally located. No hip fracture. The pubic symphysis and SI joints are intact. No definite pelvic fractures. IMPRESSION: No acute bony findings.  Electronically Signed   By: Marijo Sanes M.D.   On: 09/05/2019 11:22   DG CHEST PORT 1 VIEW  Result Date: 09/14/2019 CLINICAL DATA:  Leukocytosis EXAM: PORTABLE CHEST 1 VIEW COMPARISON:  Portable exam 0957 hours compared to 09/11/2019 FINDINGS: Normal heart size, mediastinal contours, and pulmonary vascularity. Subsegmental atelectasis RIGHT base. Remaining lungs clear. No pulmonary infiltrate, pleural effusion or pneumothorax. Old healed fracture mid LEFT clavicle. No acute osseous findings. IMPRESSION: Subsegmental atelectasis RIGHT base. Electronically Signed   By: Lavonia Dana M.D.   On: 09/14/2019 11:41   DG Chest Port 1 View  Result Date: 09/11/2019 CLINICAL DATA:  Respiratory failure, fall, head injury EXAM: PORTABLE CHEST 1 VIEW COMPARISON:  09/06/2019 FINDINGS: The heart size and mediastinal contours are within normal limits. Subtle heterogeneous airspace opacities of the lung bases. The visualized skeletal structures are unremarkable. IMPRESSION: Subtle heterogeneous airspace opacities of the lung bases, suspicious for infection or aspiration. Electronically Signed   By: Eddie Candle M.D.   On: 09/11/2019 10:57   DG Chest Port 1 View  Result Date: 09/06/2019 CLINICAL DATA:  Fall. EXAM: PORTABLE CHEST 1 VIEW COMPARISON:  Chest CT and chest x-ray 09/05/2019. FINDINGS: Endotracheal tube noted stable position. NG tube noted with tip coiled in stomach. Mediastinum hilar structures normal. Heart size normal. Lungs scratched it mild right mid lung subsegmental atelectasis. No focal alveolar infiltrate. No pleural effusion or pneumothorax. No acute bony abnormality identified IMPRESSION: 1. Endotracheal tube in stable position. NG tube noted with tip coiled in stomach. 2. Mild right base subsegmental atelectasis. No acute cardiopulmonary disease otherwise noted. Electronically Signed   By: Marcello Moores  Register   On: 09/06/2019 06:48   DG Chest Port 1 View  Result Date: 09/05/2019 CLINICAL DATA:  Level 1  trauma. Fell 20 feet. EXAM: PORTABLE CHEST 1 VIEW COMPARISON:  None. FINDINGS: The endotracheal tube is in good position, 3.8 cm above the carina. The cardiac silhouette, mediastinal and hilar contours are within normal limits. The lungs are clear. No pleural effusion or pneumothorax. The bony thorax is grossly intact. No obvious acute rib fractures. Remote healed mid left clavicle fracture is noted. IMPRESSION: 1. Endotracheal tube in good position, 3.8 cm above the carina. 2. No acute cardiopulmonary findings. Electronically Signed   By: Marijo Sanes M.D.   On: 09/05/2019 11:22   VAS [place] LOWER EXTREMITY VENOUS (DVT)  Result Date: 09/17/2019  Lower Venous DVTStudy Indications: Leukocytosis.  Comparison Study: No prior study Performing Technologist:  Michelle Simonetti MHA, RDMS, RVT, RDCS  Examination Guidelines: A complete evaluation includes B-mode imaging, spectral Doppler, color Doppler, and power Doppler as needed of all accessible portions of each vessel. Bilateral testing is considered an integral part of a complete examination. Limited examinations for reoccurring indications may be performed as noted. The reflux portion of the exam is performed with the patient in reverse Trendelenburg.  +---------+---------------+---------+-----------+----------+--------------+  RIGHT     Compressibility Phasicity Spontaneity Properties Thrombus Aging  +---------+---------------+---------+-----------+----------+--------------+  CFV       Full            Yes       Yes                                    +---------+---------------+---------+-----------+----------+--------------+  SFJ       Full                                                             +---------+---------------+---------+-----------+----------+--------------+  FV Prox   Full                                                             +---------+---------------+---------+-----------+----------+--------------+  FV Mid    Full                                                              +---------+---------------+---------+-----------+----------+--------------+  FV Distal Full                                                             +---------+---------------+---------+-----------+----------+--------------+  PFV       Full                                                             +---------+---------------+---------+-----------+----------+--------------+  POP       Full            Yes       Yes                                    +---------+---------------+---------+-----------+----------+--------------+  PTV       Full                                                             +---------+---------------+---------+-----------+----------+--------------+  PERO      Full                                                             +---------+---------------+---------+-----------+----------+--------------+   +---------+---------------+---------+-----------+----------+--------------+  LEFT      Compressibility Phasicity Spontaneity Properties Thrombus Aging  +---------+---------------+---------+-----------+----------+--------------+  CFV       Full            Yes       Yes                                    +---------+---------------+---------+-----------+----------+--------------+  SFJ       Full                                                             +---------+---------------+---------+-----------+----------+--------------+  FV Prox   Full                                                             +---------+---------------+---------+-----------+----------+--------------+  FV Mid    Full                                                             +---------+---------------+---------+-----------+----------+--------------+  FV Distal Full                                                             +---------+---------------+---------+-----------+----------+--------------+  PFV       Full                                                              +---------+---------------+---------+-----------+----------+--------------+  POP       Full            Yes       Yes                                    +---------+---------------+---------+-----------+----------+--------------+  PTV       Full                                                             +---------+---------------+---------+-----------+----------+--------------+  PERO      Full                                                             +---------+---------------+---------+-----------+----------+--------------+     Summary: RIGHT: - There is no evidence of deep vein thrombosis in the lower extremity.  - No cystic structure found in the popliteal fossa.  LEFT: - There is no evidence of deep vein thrombosis in the lower extremity.  - No cystic structure found in the popliteal fossa.  *See table(s) above for measurements and observations. Electronically signed by Harold Barban MD on 09/17/2019 at 6:33:31 AM.    Final    VAS [place] UPPER EXTREMITY VENOUS DUPLEX  Result Date: 09/17/2019 UPPER VENOUS STUDY  Indications: leukocytosis Limitations: Cervical collar. Comparison Study: No prior study Performing Technologist: Maudry Mayhew MHA, RDMS, RVT, RDCS  Examination Guidelines: A complete evaluation includes B-mode imaging, spectral Doppler, color Doppler, and power Doppler as needed of all accessible portions of each vessel. Bilateral testing is considered an integral part of a complete examination. Limited examinations for reoccurring indications may be performed as noted.  Right Findings: +----------+------------+---------+-----------+----------+--------------+  RIGHT      Compressible Phasicity Spontaneous Properties    Summary      +----------+------------+---------+-----------+----------+--------------+  IJV                                                      Not visualized  +----------+------------+---------+-----------+----------+--------------+  Subclavian     Full        Yes        Yes                                 +----------+------------+---------+-----------+----------+--------------+  Axillary       Full        Yes        Yes                                +----------+------------+---------+-----------+----------+--------------+  Brachial       Full        Yes        Yes                                +----------+------------+---------+-----------+----------+--------------+  Radial         Full                                                      +----------+------------+---------+-----------+----------+--------------+  Ulnar          Full                                                      +----------+------------+---------+-----------+----------+--------------+  Cephalic       Full                                                      +----------+------------+---------+-----------+----------+--------------+  Basilic        Full                                                      +----------+------------+---------+-----------+----------+--------------+  Left Findings: +----------+------------+---------+-----------+--------------+-----------------+  LEFT       Compressible Phasicity Spontaneous   Properties        Summary       +----------+------------+---------+-----------+--------------+-----------------+  IJV                                                           Not visualized    +----------+------------+---------+-----------+--------------+-----------------+  Subclavian     Full        Yes        Yes                                       +----------+------------+---------+-----------+--------------+-----------------+  Axillary       Full        Yes        Yes                                       +----------+------------+---------+-----------+--------------+-----------------+  Brachial       Full        Yes        Yes                                       +----------+------------+---------+-----------+--------------+-----------------+  Radial         Full                                                              +----------+------------+---------+-----------+--------------+-----------------+  Ulnar          Full                                                             +----------+------------+---------+-----------+--------------+-----------------+  Cephalic       None  spongy     Age Indeterminate                                                 w/compression                     +----------+------------+---------+-----------+--------------+-----------------+  Basilic                                                       Not visualized    +----------+------------+---------+-----------+--------------+-----------------+  Summary:  Right: No evidence of deep vein thrombosis involving the visualized veins of the upper extremity. No evidence of superficial vein thrombosis in the upper extremity.  Left: No evidence of deep vein thrombosis involving the visualized veins of the upper extremity. 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  *See table(s) above for measurements and observations.  Diagnosing physician: Harold Barban MD Electronically signed by Harold Barban MD on 09/17/2019 at 6:33:37 AM.    Final    CT MAXILLOFACIAL WO CONTRAST  Result Date: 09/05/2019 CLINICAL DATA:  Level 1 trauma EXAM: CT HEAD WITHOUT CONTRAST CT MAXILLOFACIAL WITHOUT CONTRAST CT CERVICAL SPINE WITHOUT CONTRAST TECHNIQUE: Multidetector CT imaging of the head, cervical spine, and maxillofacial structures were performed using the standard protocol without intravenous contrast. Multiplanar CT image reconstructions of the cervical spine and maxillofacial structures were also generated. COMPARISON:  None. FINDINGS: CT HEAD FINDINGS Brain: Mixed density subdural hematoma along the right cerebral convexity and left frontal pole. Mixed density could be from a continued and un clotted bleeding, especially on the right. The right-sided  hematoma measures up to 15 mm in thickness. Midline shift measures up to 2 cm. Pneumocephalus with intraventricular extension. Right uncal herniation. Mix of contusion and subarachnoid hemorrhage over the left temporal pole which is edematous. No visible infarct. Vascular: Negative Skull: Midline skull fracture involving the occiput and clivus. There is extension into the right sphenoid sinus, which accounts for pneumocephalus and sinus opacification. Posterior scalp contusion CT MAXILLOFACIAL FINDINGS Osseous: Intact and located mandible. Midline clivus fracture extending to the right sphenoid sinus. Orbits: No evidence of injury. Sinuses: Right sphenoid and maxillary hemosinus Soft tissues: Negative CT CERVICAL SPINE FINDINGS Alignment: Normal Skull base and vertebrae: Clival fracture. No cervical spine fracture. Soft tissues and spinal canal: Gas present around the dens. The tectorial membrane is continuous and visible. No visible canal hematoma Disc levels:  No degenerative changes or visible impingement. Upper chest: Reported separately Critical Value/emergent results were discussed in person on 09/05/2019 at 11:35 am with Dr Bobbye Morton. IMPRESSION: Head CT: 1. Right more than left cerebral convexity subdural hematoma with leftward midline shift of 2 cm and right uncal herniation. 2. Left temporal pole contusion with overlying subarachnoid hemorrhage. 3. Occipital and clival midline fractures extending into the right sphenoid sinus with pneumocephalus. Cervical spine CT: Gas around the dens presumably related to the skull base fracture. Craniocervical alignment is normal and the tectorial membrane is visible. Alar or other ligamentous injury is still conceivable. Maxillofacial CT: Clival fracture extending into the right sphenoid sinus with hemosinus and pneumocephalus. Electronically Signed   By: Monte Fantasia M.D.   On: 09/05/2019 11:40  Labs:  Basic Metabolic Panel: Recent Labs  Lab 10/01/19 0626  NA  139  K 3.6  CL 104  CO2 26  GLUCOSE 94  BUN 13  CREATININE 0.79  CALCIUM 9.8    CBC: Recent Labs  Lab 10/02/19 0452  WBC 7.4  NEUTROABS 3.2  HGB 13.9  HCT 41.9  MCV 94.6  PLT 376    CBG: No results for input(s): GLUCAP in the last 168 hours.  Family history.  Unobtainable  Brief HPI:   Jason Skinner is a 29 y.o. right-handed limited English speaking male on no prescription medications.  Lives with friends.  He does not drive.  Mobile home 4 steps to entry.  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  He has been in the [place] for approximately 3 years and has family including a spouse in [place].  He does have a brother and uncle in the local area.  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  Noted positive loss of consciousness.  Cranial CT scan showed right more than left cerebral convexity subdural hematoma with leftward midline shift of 2 cm and a right uncal herniation.  Left temporal pole contusion with overlying subarachnoid hemorrhage.  Occipital and clival midline fractures extending into the right sphenoid sinus with pneumocephalus.  CT of the chest abdomen pelvis showed an L1-L2 right transverse process fracture as well as incidental findings of a 2 cm nodular projection from the right thyroid lobe.  Admission chemistries potassium 3.1 alcohol negative lactic acid 3.1 SARS coronavirus negative.  Patient underwent right frontal temporal parietal craniectomy for evacuation of acute subdural hematoma and insertion of right frontal ventriculostomy insertion of craniotomy flap into the abdominal subcutaneous tissue 09/05/2019 per Dr. Arnoldo Morale.  Latest follow-up cranial CT scan showed decreased mass-effect of 5 mm leftward midline shift as previously 20 mm.  Conservative care of L1-2 right transverse process fractures.  Patient remained intubated through 09/10/2019.  Bilateral lower extremity Dopplers  negative for DVT and upper extremity negative there was an anterior determinant superficial vein thrombosis involving the left cephalic vein.  He was cleared to begin Lovenox for DVT prophylaxis.  His diet was advanced to mechanical soft.  Mild leukocytosis 14,300 and monitored.  Mild hyponatremia 131 improved to 136.  Therapy evaluations completed and patient was admitted for a comprehensive rehab program   Hospital Course: Jason Skinner was admitted to rehab 09/20/2019 for inpatient therapies to consist of PT, ST and OT at least three hours five days a week. Past admission physiatrist, therapy team and rehab RN have worked together to provide customized collaborative inpatient rehab.  Pertaining to patient's traumatic bilateral subdural hematoma subarachnoid hemorrhage she had undergone right frontal temporal parietal craniectomy evacuation of acute subdural hematoma and insertion of right frontal ventriculostomy insertion of craniotomy flap into the abdominal subcutaneous tissue 09/05/2019.  He did have a soft helmet in place for protection.  He would follow-up neurosurgery for planned cranioplasty.  Subcutaneous Lovenox for DVT prophylaxis no bleeding episodes.  Pain managed with use of Lidoderm patch as well as Robaxin oxycodone as needed trial of Topamax for headaches.  Bouts of urinary retention improved he was weaned from Urecholine.  Tolerating mechanical soft diet.   Blood pressures were monitored on TID basis and controlled  .  Rehab course: During patient's stay in rehab weekly team conferences were held to monitor patient's progress, set goals and discuss barriers to discharge. At admission, patient  required max assist ambulate 12 feet to person hand-held assist minimal assist sit to stand moderate assist supine to sit.  Max assist upper body bathing total assist lower body bathing max is upper body dressing total assist lower body dressing  Physical exam.  Blood pressure 115/76 pulse 77  temperature 98.6 respirations 18 oxygen saturation 97% room air Constitutional.  Patient alert no acute distress HEENT Head.  Craniectomy site clean and dry Eyes.  Pupils round and reactive to light no discharge without nystagmus Neck.  Supple nontender no JVD without thyromegaly Cardiac regular rate rhythm without any extra sounds or murmur heard Respiratory effort normal no respiratory distress without wheeze Abdomen.  Soft nontender positive bowel sounds without rebound Skin.  Abdominal surgical site clean and dry staples have been removed craniectomy site clean and dry Neurological.  Resting comfortably oriented to person and hospital follow basic commands.  Mild central nerve III weakness on right.  Moves both arms and legs spontaneously.  Senses pain in all fours  He/  has had improvement in activity tolerance, balance, postural control as well as ability to compensate for deficits. He/ has had improvement in functional use RUE/LUE  and RLE/LLE as well as improvement in awareness.  Ongoing family teaching.  Transferred sit to stand multiple trials from various surfaces supervision.  Ambulates 150 feet some verbal cues for attention to the right and contact-guard assist.  Negotiated multiple obstacle courses around cones.  A bed alarm was used for patient safety.  Completed amatory transfers for ADLs with contact-guard assist requiring some minimal cues.  Washed upper lower body supervision.  He can ambulate up to 250 feet with therapies .  Speech therapy follow-up cognition he recreated pegboard designs ranging from simple to complex with overall minimal assist.  Supervision assist verbal cues for problem solving of any corrections.  Full family teaching was completed discussed the need for supervision for safety and discharged home      Disposition: Discharged home    Diet: Soft  Special Instructions: No driving smoking or alcohol  Follow-up neurosurgery for planned  cranioplasty  Continue helmet as directed  Medications at discharge 1.  Tylenol as needed 2 Urecholine 10 mg 3 times daily taper as directed 3.  Lidoderm patch as directed 4.  Robaxin 500 mg every 8 hours as needed 5.  Oxycodone 5  mg every 6 hours as needed moderate pain 6.  MiraLAX hold for loose stools 7.  Topamax 75 mg p.o. twice daily 8.Meclizine 12.5 mg TID PRN dizziness    30-35 minutes were spent completing discharge summary and discharge planning  Discharge Instructions    Ambulatory referral to Physical Medicine Rehab   Complete by: As directed    Moderate complexity follow-up 1 to 2 weeks TBI       Follow-up Information    Meredith Staggers, MD Follow up.   Specialty: Physical Medicine and Rehabilitation Why: Office to call for appointment Contact information: [address] [place] [address] [phone]        Newman Pies, MD Follow up.   Specialty: Neurosurgery Why: Call for appointment Contact information: [address] [place] [address] [phone]               Signed: Lavon Paganini [place] 10/05/2019, 5:03 AM

## 2019-10-03 NOTE — Progress Notes (Signed)
Physical Therapy Note  Patient Details  Name: Jason Skinner MRN: [id number] Date of Birth: 15-Jun-1990 Today's Date: 10/03/2019    Patient in bed with his brother and interpreter in the room upon PT entry. Patient reported 5/10 throbbing headache and intermittent dizziness while lying in the bed. RN made aware and bringing medication when he is due in 30 min. Patient stated that he did not sleep well last night due to a constant headache and asked to sleep now. Stated he could not participate in therapy at this time. Agreeable for PT to come back in 1 hour, after medication is administered, and stated he will work with therapy at that time. Patient missed 60 min of skilled PT due to headache/fatigue, RN made aware. Will attempt to make-up missed time as able.    Vitals: BP 102/77, HR 65    Jason Skinner PT, DPT  10/03/2019, 11:13 AM

## 2019-10-03 NOTE — Progress Notes (Signed)
Physical Therapy Note  Patient Details  Name: Jason Skinner MRN: [id number] Date of Birth: 1991/02/23 Today's Date: 10/03/2019    Returned to patient's room to re-attempt therapy session. Patient set up to eat lunch with his brother assisting at bedside upon PT entry. Patient requesting to finish lunch at this time and declined participating in functional mobility or TBI education during lunch when offered. Unable to make up time missed with patient at this time.   Nidya Bouyer L Kyley Laurel PT, DPT  10/03/2019, 12:22 PM

## 2019-10-03 NOTE — Progress Notes (Signed)
[facility] PHYSICAL MEDICINE & REHABILITATION PROGRESS NOTE   Subjective/Complaints: Headaches much improved today. Cousin is at bedside. Moving bowels regularly. Slept well last night.   ROS: Limited due to cognition/language  Objective:   No results found. Recent Labs    10/02/19 0452  WBC 7.4  HGB 13.9  HCT 41.9  PLT 376   Recent Labs    10/01/19 0626  NA 139  K 3.6  CL 104  CO2 26  GLUCOSE 94  BUN 13  CREATININE 0.79  CALCIUM 9.8    Intake/Output Summary (Last 24 hours) at 10/03/2019 1548 Last data filed at 10/03/2019 0730 Gross per 24 hour  Intake 120 ml  Output 1050 ml  Net -930 ml     Physical Exam: Vital Signs Blood pressure (!) 98/64, pulse 73, temperature 98.7 F (37.1 C), resp. rate 16, height 5\' 8"  (1.727 m), weight 65.4 kg, SpO2 100 %.  General: Alert and oriented x 3, No apparent distress. Headaches improved HENT: Craniectomy DCI Eyes: Right eye with limitations in left gaze. No discharge. Cardiovascular: No JVD. Respiratory: Normal effort.  No stridor. GI: Non-distended. Skin: Scalp/abdomen CDI Psych: He is calm. Denies depression Musc: No edema in extremities.  No tenderness in extremities. Neuro: Alert Motor: Grossly 4+/5 throughout, appears unchanged    Assessment/Plan: 1. 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  Physiatrist is providing close team supervision and 24 hour management of active medical problems listed below.  Physiatrist and rehab team continue to assess barriers to discharge/monitor patient progress toward functional and medical goals  Care Tool:  Bathing    Body parts bathed by patient: Right arm, Left arm, Chest, Abdomen, Front perineal area, Right upper leg, Left upper leg, Face, Buttocks, Right lower leg, Left lower leg   Body parts bathed by helper: Right lower leg, Left lower leg, Buttocks      Bathing assist Assist Level: Supervision/Verbal cueing     Upper Body Dressing/Undressing Upper body dressing Upper body dressing/undressing activity did not occur (including orthotics): N/A What is the patient wearing?: Pull over shirt    Upper body assist Assist Level: Supervision/Verbal cueing    Lower Body Dressing/Undressing Lower body dressing    Lower body dressing activity did not occur: N/A What is the patient wearing?: Pants     Lower body assist Assist for lower body dressing: Supervision/Verbal cueing     Toileting Toileting    Toileting assist Assist for toileting: Supervision/Verbal cueing     Transfers Chair/bed transfer  Transfers assist  Chair/bed transfer activity did not occur: N/A  Chair/bed transfer assist level: Supervision/Verbal cueing     Locomotion Ambulation   Ambulation assist      Assist level: Contact Guard/Touching assist Assistive device: No Device Max distance: >150   Walk 10 feet activity   Assist     Assist level: Contact Guard/Touching assist Assistive device: No Device   Walk 50 feet activity   Assist    Assist level: Contact Guard/Touching assist Assistive device: No Device    Walk 150 feet activity   Assist Walk 150 feet activity did not occur: Safety/medical concerns  Assist level: Contact Guard/Touching assist Assistive device: No Device    Walk 10 feet on uneven surface  activity   Assist Walk 10 feet on uneven surfaces activity did not occur: Safety/medical concerns   Assist level: Minimal Assistance - Patient > 75%  Wheelchair     Assist Will patient use wheelchair at discharge?: No (Per PT long term goals ) Type of Wheelchair: Manual    Wheelchair assist level: Moderate Assistance - Patient 50 - 74% Max wheelchair distance: 150    Wheelchair 50 feet with 2 turns activity    Assist        Assist Level: Moderate Assistance - Patient 50 - 74%   Wheelchair 150  feet activity     Assist      Assist Level: Moderate Assistance - Patient 50 - 74%   Blood pressure (!) 98/64, pulse 73, temperature 98.7 F (37.1 C), resp. rate 16, height 5\' 8"  (1.727 m), weight 65.4 kg, SpO2 100 %.  Medical Problem List and Plan: 1.Decreased functional mobility with altered mental statussecondary to traumatic bilateral SDH/SAH. Status post right frontotemporoparietal craniectomy evacuation of acute subdural hematoma and insertion of right frontal ventriculostomy insertion of craniotomy flap into the abdominal subcutaneous tissue 09/05/2019. Helmet when out of bed.  Continue CIR  -soft helmet when OOB  -dc'ed c-collar 2. Antithrombotics: -DVT/anticoagulation:Lovenox 09/09/2019. Venous Doppler studies upper lower extremities negative except superficial cephalic vein thrombosis CBC stable. -antiplatelet therapy: N/A 3. Pain Management:Lidoderm patch,Robaxin 500 mg every 8 hours and oxycodone as needed  -ice/heat to occipital area, topamax 75mg  BID 4. Mood:Provide emotional support -antipsychotic agents: N/A - cont sleep wake chart 5. Neuropsych: This patientis notcapable of making decisions on hisown behalf. 6. Skin/Wound Care:Routine skin checks.  -local care to abdomen and scalp, healing well 7. Fluids/Electrolytes/Nutrition:encourage PO  BMP normal 7/27 8. L1/2 right transverse process fractures. Conservative care 9. Urinary retention.     intermittently req I/O cath  - 7/21 ua negative, cx negative  Continue Urecholine  Voiding  10. Dysphagia. Dysphagia #3 thin liquids.  11. Leukocytosis: Resolved 12. Thrombocytosis:   Platelets 696 on 7/19, stable 7/27 13.  Mild acute blood loss anemia  Hemoglobin 12.9 on 7/19, stable 7/27  LOS: 13 days A FACE TO [place] 10/03/2019, 3:48 PM

## 2019-10-04 ENCOUNTER — Inpatient Hospital Stay (HOSPITAL_COMMUNITY): Payer: Self-pay | Admitting: Speech Pathology

## 2019-10-04 ENCOUNTER — Inpatient Hospital Stay (HOSPITAL_COMMUNITY): Payer: Self-pay

## 2019-10-04 MED ORDER — METHOCARBAMOL 500 MG PO TABS: 500.0000 mg | ORAL_TABLET | Freq: Three times a day (TID) | ORAL | 0 refills | Status: DC | PRN

## 2019-10-04 MED ORDER — TOPIRAMATE 25 MG PO TABS: 75.0000 mg | ORAL_TABLET | Freq: Two times a day (BID) | ORAL | 0 refills | Status: DC

## 2019-10-04 MED ORDER — MECLIZINE HCL 25 MG PO TABS
12.5000 mg | ORAL_TABLET | Freq: Three times a day (TID) | ORAL | Status: DC | PRN
  Administered 2019-10-04 – 2019-10-05 (×2): 12.5 mg via ORAL
  Filled 2019-10-04 (×2): qty 1

## 2019-10-04 MED ORDER — LIDOCAINE 5 % EX PTCH: 1.0000 | MEDICATED_PATCH | CUTANEOUS | 0 refills | Status: DC

## 2019-10-04 MED ORDER — MECLIZINE HCL 12.5 MG PO TABS: 12.5000 mg | ORAL_TABLET | Freq: Three times a day (TID) | ORAL | 0 refills | Status: DC | PRN

## 2019-10-04 MED ORDER — OXYCODONE HCL 5 MG PO TABS: 5.0000 mg | ORAL_TABLET | Freq: Four times a day (QID) | ORAL | 0 refills | Status: DC | PRN

## 2019-10-04 MED ORDER — OXYCODONE HCL 5 MG PO TABS
5.0000 mg | ORAL_TABLET | Freq: Four times a day (QID) | ORAL | Status: DC | PRN
  Administered 2019-10-04 – 2019-10-05 (×3): 5 mg via ORAL
  Filled 2019-10-04 (×3): qty 1

## 2019-10-04 MED ORDER — OXYCODONE HCL 5 MG PO TABS: 5.0000 mg | ORAL_TABLET | ORAL | 0 refills | Status: DC | PRN

## 2019-10-04 MED ORDER — BETHANECHOL CHLORIDE 10 MG PO TABS: 10.0000 mg | ORAL_TABLET | Freq: Three times a day (TID) | ORAL | 0 refills | Status: DC

## 2019-10-04 MED ORDER — POLYETHYLENE GLYCOL 3350 17 G PO PACK: 17.0000 g | PACK | Freq: Every day | ORAL | 0 refills | Status: AC

## 2019-10-04 MED FILL — METHOCARBAMOL 500 MG TABS: 500 | 20 days supply | Qty: 60 | Fill #0

## 2019-10-04 MED FILL — BETHANECHOL 10 MG TABLET: 10 | 5 days supply | Qty: 15 | Fill #0

## 2019-10-04 MED FILL — TOPIRAMATE 25 MG TABLET: 25 | 30 days supply | Qty: 180 | Fill #0

## 2019-10-04 MED FILL — LIDOCAINE PATCH 5%: 5 | 30 days supply | Qty: 30 | Fill #0

## 2019-10-04 MED FILL — oxyCODONE HCL 5 MG TABS: 5 | 7 days supply | Qty: 30 | Fill #0

## 2019-10-04 MED FILL — POLYETHYLENE GLYCOL 3350 PO: 17 | 14 days supply | Qty: 238 | Fill #0

## 2019-10-04 MED FILL — MECLIZINE 12.5 MG CAPLET: 12.5 | 10 days supply | Qty: 30 | Fill #0

## 2019-10-04 NOTE — Progress Notes (Signed)
Occupational Therapy Discharge Summary  Patient Details  Name: Jason Skinner MRN: [id number] Date of Birth: [birth year]  Today's Date: 10/04/2019 OT Individual Time:  -     and Today's Date: 10/04/2019 OT Missed Time:   Missed Time Reason:      1:1. Interpreter present. Pt received in bed agreeable to OT and bathing at shower level. Pt completes all mobility with no AD and close S with VC for slowing approach to uphill bathroom slant. Pt able to complete BADL routine toileting, bathing, dressing and grooming with no AD and supervision. Pt demo increased postural sway in stance with no RW in front of patient. Pt demo improved termination and recall of use of soap in shower. Pt continues to bathing/dry body parts 4-5x prior to termination of task. Pt completes UB assessments as follows: 9HPT: RUE 40s LUE 50s Dynamometer: RUE 55# LUE 65# Dynavision RUE (seated no sunglasses): 3.5 sec rxn time LUE (standing w/ sunglasses): 3.1 sec rxn time Pt missed 15 min skilled OT d/t HA and RN alerted to deliver medicaiton. Exited session with pt seated in bed, exit alarm on and call light tin reach   Patient has met 13 of 13 long term goals due to improved activity tolerance, improved balance, postural control, ability to compensate for deficits, functional use of  RIGHT upper, RIGHT lower, LEFT upper and LEFT lower extremity, improved attention, improved awareness and improved coordination.  Patient to discharge at overall Supervision level.  Patient's care partner is independent to provide the necessary physical and cognitive assistance at discharge.  Pt family was present for family education and has confirmed pt will have 24/7 supervision at home d/t physical and cognitive deficits.  Reasons goals not met: n/a  Recommendation:  Patient will benefit from ongoing skilled OT services in outpatient setting to continue to advance functional skills in the area of BADL, iADL, Vocation and Reduce care partner  burden.  Equipment: No equipment provided  Reasons for discharge: treatment goals met and discharge from hospital  Patient/family agrees with progress made and goals achieved: Yes  OT Discharge Precautions/Restrictions  Precautions Precaution Comments: Craniectomy Required Braces or Orthoses: Cervical Brace;Other Brace (C collar for comfort) Other Brace: soft helmet when OOB Restrictions Weight Bearing Restrictions: No General PT Missed Treatment Reason: Patient fatigue;Pain Vital Signs Therapy Vitals Pulse Rate: 76 Resp: 16 BP: 111/78 Patient Position (if appropriate): Lying Oxygen Therapy SpO2: 99 % O2 Device: Room Air Pain Pain Assessment Pain Scale: Faces Faces Pain Scale: Hurts whole lot Pain Type: Acute pain Pain Location: Head Pain Descriptors / Indicators: Headache Pain Onset: On-going Pain Intervention(s): RN made aware Multiple Pain Sites: No ADL ADL Eating: Supervision/safety Grooming: Supervision/safety Upper Body Bathing: Supervision/safety Where Assessed-Upper Body Bathing: Sitting at sink Lower Body Bathing: Supervision/safety Where Assessed-Lower Body Bathing: Sitting at sink, Standing at sink Upper Body Dressing: Supervision/safety Where Assessed-Upper Body Dressing: Sitting at sink Lower Body Dressing: Supervision/safety Where Assessed-Lower Body Dressing: Sitting at sink, Standing at sink Toileting: Supervision/safety Where Assessed-Toileting: Glass blower/designer: Close supervision Toilet Transfer Method: Stand pivot Tub/Shower Transfer: Close supervison Social research officer, government: Close supervision Vision Baseline Vision/History: No visual deficits Patient Visual Report: Diplopia;Eye fatigue/eye pain/headache Vision Assessment?: Yes Eye Alignment: Impaired (comment) Ocular Range of Motion: Impaired-to be further tested in functional context Alignment/Gaze Preference: Chin down Tracking/Visual Pursuits: Left eye does not track  medially Saccades: Additional eye shifts occurred during testing Diplopia Assessment: Disappears with one eye closed;Objects split side to side;Present all the time/all directions;Present in near  gaze;Present in far gaze Depth Perception: Undershoots Perception  Perception: Impaired Inattention/Neglect: Does not attend to left visual field Praxis Praxis: Intact Cognition Overall Cognitive Status: Impaired/Different from baseline Arousal/Alertness: Awake/alert Attention: Selective Focused Attention: Appears intact Sustained Attention: Appears intact Selective Attention: Impaired Selective Attention Impairment: Verbal basic;Functional complex Memory: Impaired Memory Impairment: Decreased short term memory;Other (comment) Decreased Short Term Memory: Verbal basic;Functional basic Immediate Memory Recall: Sock;Blue;Bed Memory Recall Sock: With Cue Memory Recall Blue: Without Cue Memory Recall Bed: Without Cue Awareness: Impaired Problem Solving: Impaired Problem Solving Impairment: Verbal basic;Functional basic Executive Function: Organizing;Self Monitoring Reasoning: Impaired Reasoning Impairment: Verbal basic Organizing: Impaired Organizing Impairment: Functional basic Initiating: Impaired Initiating Impairment: Verbal basic;Functional basic Self Monitoring: Impaired Self Monitoring Impairment: Verbal basic;Functional basic Safety/Judgment: Impaired Comments: safety impaired due to decreased intellectual awareness of physical and cognitive deficits [employer] Scales of Cognitive Functioning: Automatic/appropriate Sensation Sensation Light Touch: Appears Intact Proprioception: Impaired by gross assessment Coordination Gross Motor Movements are Fluid and Coordinated: No Fine Motor Movements are Fluid and Coordinated: No Coordination and Movement Description: mild dysmetria in BLE R>L Finger Nose Finger Test: impaired BUE, R>L Heel Shin Test: mild dysmetria in BLE,  R>L Motor  Motor Motor: Hemiplegia;Abnormal postural alignment and control;Ataxia Motor - Skilled Clinical Observations: mild R hemi, R>L coordination deficits, Mobility  Bed Mobility Bed Mobility: Rolling Right;Rolling Left;Supine to Sit;Sit to Supine Rolling Right: Independent Rolling Left: Independent Supine to Sit: Independent Sit to Supine: Independent Transfers Sit to Stand: Supervision/Verbal cueing Stand to Sit: Supervision/Verbal cueing  Trunk/Postural Assessment  Cervical Assessment Cervical Assessment:  (mild forward head) Thoracic Assessment Thoracic Assessment:  (rounded shoulders) Lumbar Assessment Lumbar Assessment: Within Functional Limits Postural Control Postural Control: Deficits on evaluation Righting Reactions: decreased/delayed Protective Responses: decreased/delayed Postural Limitations: poor awareness of lateral LOB  Balance Static Sitting Balance Static Sitting - Level of Assistance: 7: Independent Dynamic Sitting Balance Dynamic Sitting - Level of Assistance: 7: Independent Static Standing Balance Static Standing - Level of Assistance: 5: Stand by assistance Dynamic Standing Balance Dynamic Standing - Level of Assistance: 5: Stand by assistance Extremity/Trunk Assessment RUE Assessment RUE Assessment: Exceptions to [facility] Active Range of Motion (AROM) Comments: shoudler- 0-90*; full elbow/wrist/digit LUE Assessment Active Range of Motion (AROM) Comments: Darryll Capers   Lowella Dell  10/04/2019, 2:14 PM

## 2019-10-04 NOTE — Progress Notes (Signed)
Physical Therapy Discharge Summary  Patient Details  Name: Jason Skinner MRN: [id number] Date of Birth: 1990-12-15  Today's Date: 10/04/2019 PT Individual Time: 0900-0950 PT Individual Time Calculation (min): 50 min    Patient has met 4 of 8 long term goals due to improved activity tolerance, improved balance, improved postural control, increased strength, increased range of motion, decreased pain, ability to compensate for deficits, improved attention, improved awareness and improved coordination.  Patient to discharge at an ambulatory level [place].  Patient's care partner is independent to provide the necessary physical and cognitive assistance at discharge.  Reasons goals not met: Patient had goals for supervision with dynamic balance and gait, however, patient has been limited by headache and dizziness with mobility and demonstrates need for CGA with standing, ambulation, and stairs for safety to prevent falls. Patient's family has demonstrated that they can provide this level of assistance at d/c.  Recommendation:  Patient will benefit from ongoing skilled PT services in outpatient setting to continue to advance safe functional mobility, address ongoing impairments in balance, strength, coordination, functional mobility, activity tolerance, gait and stair training, community integration, patient/caregiver education, and minimize fall risk.  Equipment: No equipment provided  Reasons for discharge: treatment goals met  Patient/family agrees with progress made and goals achieved: Yes  Skilled Therapeutic Intervention: Patient in bed with his brother in the room upon PT arrival. Patient alert and agreeable to PT session. Patient reported 5/10 headache with intermittent dizziness during session, RN made aware. PT provided repositioning, rest breaks, and distraction as pain interventions throughout session. Dizziness onset would occur at rest or with mobility, improved with gaze  stabilization with focus on a still object (patient's thumb).  Interpreter was present throughout session. Soft helmet donned with all mobility. Patient's brother able to don helmet independently.   Therapeutic Activity: Bed Mobility: Patient performed supine to/from sit with mod I for increased time.  Transfers: Patient performed sit to/from stand x6 with supervision for safety due to imbalance and intermittent dizziness.   Gait Training:  Patient ambulated >200 feet x2 without an AD with CGA for safety and steadying support due to ataxic gait and intermittent dizziness during session. Ambulated as described below. Patient's brother provided assist during second trial with safe guarding technique. Patient ambulated up/down a ramp, over 10 feet of mulch (unlevel surface), and up/down a curb to simulate community ambulation over unlevel surfaces with CGA-min A using HHA. Provided cues for technique and use of AD. Performed a second trial with his brother providing safe assist. Patient ascended/descended 12 steps using R rail only with B UE support to simulate home set-up with CGA. Performed step-to gait pattern. Provided cues for technique and sequencing. PT demonstrated guarding technique on first 4 steps, then his brother provided safe guarding technique on the remaining 8 steps.   Neuromuscular Re-ed: Patient performed the following [place] activities from [employer] handout provided with spanish translation: -mini squats x10 -tandem stance with single UE support x30 sec -SLS with single UE support x30 sec -ambulating backwards x10 ft -side-stepping R/L x10 feet -sit to stand x10 without UE support -stairs (as above) Educated on performing HEP 2x/day 7 days/week with someone providing close supervision, within arms reach, throughout exercises for safety.   Reviewed education with patient and his brother on fall risk/prevention, home modifications to prevent falls, and activation of emergency  services in the event of a fall during session.   Patient in bed complaining of headache and fatigue with his brother  in the room at end of session with breaks locked, bed alarm set, and all needs within reach. Patient missed 10 min of skilled PT due to pain/fatigue, RN and MD made aware. Will attempt to make-up missed time as able.     PT Discharge Precautions/Restrictions Precautions Precautions: Fall Precaution Comments: Craniectomy Other Brace: soft helmet when OOB Restrictions Weight Bearing Restrictions: No Vision/Perception  Vision - Assessment Alignment/Gaze Preference: Chin down (L exotropia) Tracking/Visual Pursuits: Left eye does not track medially Saccades: Additional eye shifts occurred during testing Convergence: Impaired - to be further tested in functional context Diplopia Assessment: Disappears with one eye closed;Objects split side to side;Present all the time/all directions;Present in near gaze;Present in far gaze Perception Perception: Impaired Inattention/Neglect: Does not attend to right visual field Praxis Praxis: Intact  Cognition Overall Cognitive Status: Impaired/Different from baseline Arousal/Alertness: Awake/alert Attention: Selective Sustained Attention: Appears intact Selective Attention: Impaired Selective Attention Impairment: Verbal basic;Functional complex Memory: Impaired Memory Impairment: Decreased short term memory;Other (comment) (decreased working memory) Decreased Short Term Memory: Verbal basic;Functional basic Awareness: Impaired Awareness Impairment: Intellectual impairment;Emergent impairment Problem Solving: Impaired Problem Solving Impairment: Verbal basic;Functional basic Executive Function: Organizing;Self Monitoring Reasoning: Impaired Reasoning Impairment: Verbal basic Organizing: Impaired Self Monitoring: Impaired Self Monitoring Impairment: Verbal basic;Functional basic Safety/Judgment: Impaired [employer]  Scales of Cognitive Functioning: Automatic/appropriate Sensation Sensation Light Touch: Appears Intact Proprioception: Impaired by gross assessment Coordination Gross Motor Movements are Fluid and Coordinated: No Fine Motor Movements are Fluid and Coordinated: No Coordination and Movement Description: mild dysmetria in BLE R>L Finger Nose Finger Test: impaired BUE, R>L Heel Shin Test: mild dysmetria in BLE, R>L Motor  Motor Motor: Hemiplegia;Abnormal postural alignment and control;Ataxia Motor - Skilled Clinical Observations: mild R hemi, R>L coordination deficits,  Mobility Bed Mobility Bed Mobility: Rolling Right;Rolling Left;Supine to Sit;Sit to Supine Rolling Right: Independent Rolling Left: Independent Supine to Sit: Independent Sit to Supine: Independent Transfers Transfers: Sit to Stand;Stand to Sit;Stand Pivot Transfers Sit to Stand: Supervision/Verbal cueing Stand to Sit: Supervision/Verbal cueing Stand Pivot Transfers: Supervision/Verbal cueing Transfer (Assistive device): None Locomotion  Gait Ambulation: Yes Gait Assistance: Contact Guard/Touching assist Gait Distance (Feet): 200 Feet Assistive device: None Gait Assistance Details: CGA for steadying assist due to ataxic gait and dizziness with mobility Gait Gait: Yes Gait Pattern: Ataxic;Scissoring;Decreased stride length;Decreased hip/knee flexion - right;Decreased hip/knee flexion - left;Decreased trunk rotation;Narrow base of support Gait velocity: decreased Stairs / Additional Locomotion Stairs: Yes Stairs Assistance: Contact Guard/Touching assist Stair Management Technique: One rail Right (B UE support) Number of Stairs: 12 Height of Stairs: 6 Ramp: Contact Guard/touching assist Curb: Nurse, mental health 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, petroleum)  Trunk/Postural Assessment  Cervical Assessment Cervical Assessment: Exceptions to [facility] (mild forward  head) Thoracic Assessment Thoracic Assessment: Exceptions to [facility] (rounded shoulders) Lumbar Assessment Lumbar Assessment: Within Functional Limits Postural Control Postural Control: Deficits on evaluation Righting Reactions: decreased/delayed Protective Responses: decreased/delayed  Balance Static Sitting Balance Static Sitting - Level of Assistance: 7: Independent Dynamic Sitting Balance Dynamic Sitting - Level of Assistance: 7: Independent Static Standing Balance Static Standing - Level of Assistance: 5: Stand by assistance Dynamic Standing Balance Dynamic Standing - Level of Assistance: 5: Stand by assistance Extremity Assessment  RLE Assessment RLE Assessment: Within Functional Limits General Strength Comments: Grossly 5/5 throughut in sitting LLE Assessment LLE Assessment: Within Functional Limits General Strength Comments: Grossly 5/5 throughout in sitting    Raven Furnas L Bacilio Abascal PT, DPT  10/04/2019, 1:02 PM

## 2019-10-04 NOTE — Progress Notes (Signed)
PHYSICAL MEDICINE & REHABILITATION PROGRESS NOTE   Subjective/Complaints: Headaches better I heard from Cherie this morning that he was having abdominal pain- he says it has since improved since he had a BM today. Working with [place] now  ROS: Limited due to cognition/language  Objective:   No results found. Recent Labs    10/02/19 0452  WBC 7.4  HGB 13.9  HCT 41.9  PLT 376   No results for input(s): NA, K, CL, CO2, GLUCOSE, BUN, CREATININE, CALCIUM in the last 72 hours.  Intake/Output Summary (Last 24 hours) at 10/04/2019 1443 Last data filed at 10/04/2019 1310 Gross per 24 hour  Intake 840 ml  Output 1275 ml  Net -435 ml     Physical Exam: Vital Signs Blood pressure 111/78, pulse 76, temperature 97.9 F (36.6 C), temperature source Oral, resp. rate 16, height 5\' 8"  (1.727 m), weight 65.4 kg, SpO2 99 %. Gen: no distress, normal appearing HENT: Craniectomy DCI Eyes: Right eye with limitations in left gaze. No discharge. Cardiovascular: No JVD. Respiratory: Normal effort.  No stridor. GI: Non-distended. Skin: Scalp/abdomen CDI Psych: He is calm. Denies depression Musc: No edema in extremities.  No tenderness in extremities. Neuro: Alert Motor: Grossly 4+/5 throughout, appears unchanged. Donning socks independently  Assessment/Plan: 1. 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  Physiatrist is providing close team supervision and 24 hour management of active medical problems listed below.  Physiatrist and rehab team continue to assess barriers to discharge/monitor patient progress toward functional and medical goals  Care Tool:  Bathing    Body parts bathed by patient: Right arm, Left arm, Chest, Abdomen, Front perineal area, Right upper leg, Left upper leg, Face, Buttocks, Right lower leg, Left lower leg   Body parts bathed by helper:  Right lower leg, Left lower leg, Buttocks     Bathing assist Assist Level: Supervision/Verbal cueing     Upper Body Dressing/Undressing Upper body dressing Upper body dressing/undressing activity did not occur (including orthotics): N/A What is the patient wearing?: Pull over shirt    Upper body assist Assist Level: Supervision/Verbal cueing    Lower Body Dressing/Undressing Lower body dressing    Lower body dressing activity did not occur: N/A What is the patient wearing?: Pants     Lower body assist Assist for lower body dressing: Supervision/Verbal cueing     Toileting Toileting    Toileting assist Assist for toileting: Supervision/Verbal cueing     Transfers Chair/bed transfer  Transfers assist  Chair/bed transfer activity did not occur: N/A  Chair/bed transfer assist level: Supervision/Verbal cueing     Locomotion Ambulation   Ambulation assist      Assist level: Contact Guard/Touching assist Assistive device: No Device Max distance: >150   Walk 10 feet activity   Assist     Assist level: Contact Guard/Touching assist Assistive device: No Device   Walk 50 feet activity   Assist    Assist level: Contact Guard/Touching assist Assistive device: No Device    Walk 150 feet activity   Assist Walk 150 feet activity did not occur: Safety/medical concerns  Assist level: Contact Guard/Touching assist Assistive device: No Device    Walk 10 feet on uneven surface  activity   Assist Walk 10 feet on uneven surfaces activity did not occur: Safety/medical concerns   Assist level: Minimal Assistance - Patient > 75%     Wheelchair  Assist Will patient use wheelchair at discharge?: No (Per PT long term goals ) Type of Wheelchair: Manual    Wheelchair assist level: Moderate Assistance - Patient 50 - 74% Max wheelchair distance: 150    Wheelchair 50 feet with 2 turns activity    Assist        Assist Level: Moderate  Assistance - Patient 50 - 74%   Wheelchair 150 feet activity     Assist      Assist Level: Moderate Assistance - Patient 50 - 74%   Blood pressure 111/78, pulse 76, temperature 97.9 F (36.6 C), temperature source Oral, resp. rate 16, height 5\' 8"  (1.727 m), weight 65.4 kg, SpO2 99 %.  Medical Problem List and Plan: 1.Decreased functional mobility with altered mental statussecondary to traumatic bilateral SDH/SAH. Status post right frontotemporoparietal craniectomy evacuation of acute subdural hematoma and insertion of right frontal ventriculostomy insertion of craniotomy flap into the abdominal subcutaneous tissue 09/05/2019. Helmet when out of bed.  Continue CIR  -soft helmet when OOB  -dc'ed c-collar 2. Antithrombotics: -DVT/anticoagulation:Lovenox 09/09/2019. Venous Doppler studies upper lower extremities negative except superficial cephalic vein thrombosis CBC stable. -antiplatelet therapy: N/A 3. Pain Management:Lidoderm patch,Robaxin 500 mg every 8 hours and oxycodone as needed  -ice/heat to occipital area, topamax 75mg  BID, better controlled with this regimen 4. Mood:Provide emotional support -antipsychotic agents: N/A - cont sleep wake chart 5. Neuropsych: This patientis notcapable of making decisions on hisown behalf. 6. Skin/Wound Care:Routine skin checks.  -local care to abdomen and scalp, healing well 7. Fluids/Electrolytes/Nutrition:encourage PO  BMP normal 7/27 8. L1/2 right transverse process fractures. Conservative care 9. Urinary retention.     intermittently req I/O cath  - 7/21 ua negative, cx negative  Continue Urecholine  Voiding  10. Dysphagia. Continue Dysphagia #3 thin liquids.  11. Leukocytosis: Resolved 12. Thrombocytosis:   Platelets 696 on 7/19, stable 7/27 13.  Mild acute blood loss anemia  Hemoglobin 12.9 on 7/19, stable 7/27  LOS: 14 days A FACE TO Melvina 10/04/2019, 2:43 PM

## 2019-10-05 MED ORDER — TOPIRAMATE 100 MG PO TABS: 100.0000 mg | ORAL_TABLET | Freq: Two times a day (BID) | ORAL | Status: DC

## 2019-10-05 NOTE — Progress Notes (Signed)
[place] PHYSICAL MEDICINE & REHABILITATION PROGRESS NOTE   Subjective/Complaints: Resting as cousin says he did not sleep well at night.  He was having severe headache last night. Discussed increasing Topamax dose.  Cousin asks about cost of medications at home.   ROS: Limited due to cognition/language  Objective:   No results found. No results for input(s): WBC, HGB, HCT, PLT in the last 72 hours. No results for input(s): NA, K, CL, CO2, GLUCOSE, BUN, CREATININE, CALCIUM in the last 72 hours.  Intake/Output Summary (Last 24 hours) at 10/05/2019 1026 Last data filed at 10/05/2019 0817 Gross per 24 hour  Intake 840 ml  Output 1150 ml  Net -310 ml     Physical Exam: Vital Signs Blood pressure 110/76, pulse 73, temperature 99.5 F (37.5 C), temperature source Oral, resp. rate 16, height 5\' 8"  (1.727 m), weight 65.4 kg, SpO2 99 %. Gen: no distress, normal appearing, sleeping, cousin and brother are at bedside.  HENT: Craniectomy DCI Eyes: Right eye with limitations in left gaze. No discharge. Cardiovascular: No JVD. Respiratory: Normal effort.  No stridor. GI: Non-distended. Skin: Scalp/abdomen CDI Psych: He is calm. Denies depression Musc: No edema in extremities.  No tenderness in extremities. Neuro: Alert Motor: Grossly 4+/5 throughout, appears unchanged. Donning socks independently  Assessment/Plan: 1. 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  Physiatrist is providing close team supervision and 24 hour management of active medical problems listed below.  Physiatrist and rehab team continue to assess barriers to discharge/monitor patient progress toward functional and medical goals  Care Tool:  Bathing    Body parts bathed by patient: Right arm, Left arm, Chest, Abdomen, Front perineal area, Right upper leg, Left upper leg, Face, Buttocks, Right lower leg,  Left lower leg   Body parts bathed by helper: Right lower leg, Left lower leg, Buttocks     Bathing assist Assist Level: Supervision/Verbal cueing     Upper Body Dressing/Undressing Upper body dressing Upper body dressing/undressing activity did not occur (including orthotics): N/A What is the patient wearing?: Pull over shirt    Upper body assist Assist Level: Supervision/Verbal cueing    Lower Body Dressing/Undressing Lower body dressing    Lower body dressing activity did not occur: N/A What is the patient wearing?: Pants     Lower body assist Assist for lower body dressing: Supervision/Verbal cueing     Toileting Toileting    Toileting assist Assist for toileting: Set up assist     Transfers Chair/bed transfer  Transfers assist  Chair/bed transfer activity did not occur: N/A  Chair/bed transfer assist level: Supervision/Verbal cueing     Locomotion Ambulation   Ambulation assist      Assist level: Contact Guard/Touching assist Assistive device: No Device Max distance: >200 ft   Walk 10 feet activity   Assist     Assist level: Contact Guard/Touching assist Assistive device: No Device   Walk 50 feet activity   Assist    Assist level: Contact Guard/Touching assist Assistive device: No Device    Walk 150 feet activity   Assist Walk 150 feet activity did not occur: Safety/medical concerns  Assist level: Contact Guard/Touching assist Assistive device: No Device    Walk 10 feet on uneven surface  activity   Assist Walk 10 feet on uneven surfaces activity did not occur: Safety/medical concerns   Assist level: Minimal Assistance - Patient > 75%  Wheelchair     Assist Will patient use wheelchair at discharge?: No (patient is a functional ambulator) Type of Wheelchair: Manual Wheelchair activity did not occur: N/A  Wheelchair assist level: Moderate Assistance - Patient 50 - 74% Max wheelchair distance: 150    Wheelchair  50 feet with 2 turns activity    Assist    Wheelchair 50 feet with 2 turns activity did not occur: N/A   Assist Level: Moderate Assistance - Patient 50 - 74%   Wheelchair 150 feet activity     Assist  Wheelchair 150 feet activity did not occur: N/A   Assist Level: Moderate Assistance - Patient 50 - 74%   Blood pressure 110/76, pulse 73, temperature 99.5 F (37.5 C), temperature source Oral, resp. rate 16, height 5\' 8"  (1.727 m), weight 65.4 kg, SpO2 99 %.  Medical Problem List and Plan: 1.Decreased functional mobility with altered mental statussecondary to traumatic bilateral SDH/SAH. Status post right frontotemporoparietal craniectomy evacuation of acute subdural hematoma and insertion of right frontal ventriculostomy insertion of craniotomy flap into the abdominal subcutaneous tissue 09/05/2019. Helmet when out of bed.  DC home today  -soft helmet when OOB  -dc'ed c-collar 2. Antithrombotics: -DVT/anticoagulation:Lovenox 09/09/2019. Venous Doppler studies upper lower extremities negative except superficial cephalic vein thrombosis CBC stable. -antiplatelet therapy: N/A 3. Pain Management:Lidoderm patch,Robaxin 500 mg every 8 hours and oxycodone as needed  -ice/heat to occipital area, increase Topamax to 100mg  BID, better controlled with this regimen 4. Mood:Provide emotional support -antipsychotic agents: N/A 5. Neuropsych: This patientis notcapable of making decisions on hisown behalf. 6. Skin/Wound Care:Routine skin checks.  -local care to abdomen and scalp, healing well 7. Fluids/Electrolytes/Nutrition:encourage PO  BMP normal 7/27 8. L1/2 right transverse process fractures. Conservative care 9. Urinary retention.     intermittently req I/O cath  - 7/21 ua negative, cx negative  Continue Urecholine  Voiding 10. Dysphagia. Continue Dysphagia #3 thin liquids.  11. Leukocytosis: Resolved 12. Thrombocytosis:    Platelets 696 on 7/19, stable 7/27 13.  Mild acute blood loss anemia  Hemoglobin 12.9 on 7/19, stable 7/27   >30 minutes spent in discharge of patient including review of medications and follow-up appointments, physical examination, discussing Lovenox, cost of medications, and in answering all patient's questions  LOS: 15 days A FACE TO FACE EVALUATION WAS [place] 10/05/2019, 10:26 AM

## 2019-10-05 NOTE — Progress Notes (Signed)
Patient discharged off of unit with all belongings. Discharge papers/instructions explained by physician assistant to family. Patient and family have no further questions at time of discharge. No complications noted at this time.  Darion Juhasz L Jaiceon Collister  

## 2019-10-05 NOTE — Plan of Care (Signed)
  Problem: Consults Goal: RH BRAIN INJURY PATIENT EDUCATION Description: Description: See Patient Education module for eduction specifics Outcome: Completed/Met Goal: Skin Care Protocol Initiated - if Braden Score 18 or less Description: If consults are not indicated, leave blank or document N/A Outcome: Completed/Met Goal: Nutrition Consult-if indicated Outcome: Completed/Met   Problem: RH SKIN INTEGRITY Goal: RH STG SKIN FREE OF INFECTION/BREAKDOWN Description: Min assist to prevent breakdown and reduce risk of infection   Outcome: Completed/Met Goal: RH STG MAINTAIN SKIN INTEGRITY WITH ASSISTANCE Description: STG Maintain Skin Integrity With min Assistance. Outcome: Completed/Met   Problem: RH SAFETY Goal: RH STG ADHERE TO SAFETY PRECAUTIONS W/ASSISTANCE/DEVICE Description: STG Adhere to Safety Precautions With cues/reminders Assistance/Device. Outcome: Completed/Met   Problem: RH PAIN MANAGEMENT Goal: RH STG PAIN MANAGED AT OR BELOW PT'S PAIN GOAL Description: Less than 4  Outcome: Completed/Met   Problem: RH KNOWLEDGE DEFICIT BRAIN INJURY Goal: RH STG INCREASE KNOWLEDGE OF SELF CARE AFTER BRAIN INJURY Description: Pt/family will be able to verbalize fall precautions and safety ideas at home as well as rancho levels of TBI using handouts/teaching using interpreter with min assist. Outcome: Completed/Met

## 2019-10-08 NOTE — Progress Notes (Signed)
Inpatient Rehabilitation Care Coordinator  Discharge Note  The overall goal for the admission was met for:   Discharge location: Yes. D/c to home with his cousin Jason Skinner and two roommates.   Length of Stay: Yes. 14 days.   Discharge activity level: Yes. Supervision.  Home/community participation: Yes. Limited.   Services provided included: MD, RD, PT, OT, SLP, RN, CM, TR, Pharmacy, Neuropsych and SW  Financial Services: Other: Uninsured  Follow-up services arranged: Outpatient: [facility] for PT/OT/SLP (self pay)  Comments (or additional information):contact pt brother Jason Skinner [phone] ([place] interpreter required)  Patient/Family verbalized understanding of follow-up arrangements: Yes  Individual responsible for coordination of the follow-up plan: Pt to have assistance with coordinating care needs.   Confirmed correct DME delivered: Rana Snare 10/08/2019    Rana Snare

## 2019-10-11 ENCOUNTER — Ambulatory Visit (INDEPENDENT_AMBULATORY_CARE_PROVIDER_SITE_OTHER): Payer: Self-pay | Admitting: Nurse Practitioner

## 2019-10-11 ENCOUNTER — Other Ambulatory Visit: Payer: Self-pay

## 2019-10-11 ENCOUNTER — Encounter: Payer: Self-pay | Admitting: Nurse Practitioner

## 2019-10-11 VITALS — BP 103/66 | HR 75 | Temp 98.0°F | Resp 16 | Ht 67.0 in | Wt 144.6 lb

## 2019-10-11 DIAGNOSIS — S065XAA Traumatic subdural hemorrhage with loss of consciousness status unknown, initial encounter: Secondary | ICD-10-CM

## 2019-10-11 DIAGNOSIS — S065X9A Traumatic subdural hemorrhage with loss of consciousness of unspecified duration, initial encounter: Secondary | ICD-10-CM

## 2019-10-11 DIAGNOSIS — R339 Retention of urine, unspecified: Secondary | ICD-10-CM

## 2019-10-11 DIAGNOSIS — Z09 Encounter for follow-up examination after completed treatment for conditions other than malignant neoplasm: Secondary | ICD-10-CM

## 2019-10-11 DIAGNOSIS — G441 Vascular headache, not elsewhere classified: Secondary | ICD-10-CM

## 2019-10-11 NOTE — Patient Instructions (Signed)
Craneotoma, cuidados posteriores Craniectomy, Care After Esta hoja le brinda informacin sobre cmo cuidarse despus del procedimiento. Su mdico tambin podr darle instrucciones ms especficas. Comunquese con el mdico si tiene problemas o preguntas. Qu puedo esperar despus del procedimiento? Despus del procedimiento, es normal [employer] siguientes sntomas:  Dolores de [place].  Adormecimiento o sensibilidad en el cuero cabelludo alrededor del lugar de la incisin. Es probable que Production designer, theatre/television/film un casco para proteger el cerebro de otras lesiones. Siga estas instrucciones en su casa: Si tiene un casco:  Use el casco como se lo haya indicado el mdico. Quteselas solamente como se lo haya indicado el mdico.  Cuando se ponga el casco: ? Asegrese de Systems analyst correa del mentn por delante de las orejas. ? Sujete el casco con firmeza. Debe haber espacio para poder deslizar [place] dedos debajo del mentn. ? Colquese el borde inferior del casco justo por encima de las cejas.  No retire las almohadillas del interior del casco.  Stacy Gardner el casco segn sea necesario con agua caliente y [place]. Cuidado de la incisin      Siga las instrucciones del mdico acerca del cuidado de la incisin. Asegrese de hacer lo siguiente: ? Lvese las manos con agua y [place] antes y despus de cambiar una venda (vendaje). Use desinfectante para manos si no dispone de [place] y [place]. ? Cambie el vendaje como se lo haya indicado el mdico. ? No retire los puntos (suturas), la goma para cerrar la piel o las tiras [place]. Es posible que estos cierres cutneos deban quedar puestos en la piel durante 2semanas o ms tiempo. Si los bordes de las tiras adhesivas empiezan a despegarse y Therapist, sports, puede recortar los que estn sueltos. No retire las tiras [facility] por completo a menos que el mdico se lo indique.  [place] zona de la incisin para detectar signos de infeccin. Est atento a  los siguientes signos: ? Aumento del enrojecimiento, la hinchazn o Conservation officer, historic buildings. ? Lquido o sangre. ? Calor. ? Pus o mal olor.  No tome baos de inmersin, no nade ni use el jacuzzi hasta que el mdico lo autorice. Pregntele al mdico si puede ducharse. Thurston Pounds solo le permitan darse baos de [place]. Actividad   Haga reposo como se lo haya indicado el mdico.  Pregntele al mdico cundo es seguro volver a Forensic psychologist.  No levante ningn objeto que pese ms de 10libras (4.5kg) o que supere el lmite de peso que le hayan indicado, hasta que el mdico le diga que puede [place].  Evite las [employer] en las que podra golpearse la cabeza hasta que el mdico lo autorice. Estas actividades incluyen los deportes, andar en bicicleta o en motocicleta, o trabajar en altura.  Retome sus actividades normales como se lo haya indicado el mdico. Pregntele al mdico qu actividades son seguras para usted. Medicamentos  Use los medicamentos de venta libre y los recetados solamente como se lo haya indicado el mdico.  Si le recetaron un antibitico, tmelo como se lo haya indicado el mdico. No deje de usar el antibitico aunque comience a Sports administrator.  Pregntele al mdico si el medicamento recetado: ? Hace necesario que evite conducir o usar maquinaria pesada. ? Puede causarle estreimiento. Es posible que tenga que tomar estas medidas para prevenir o tratar el estreimiento:  Electronics engineer suficiente lquido como para Theatre manager la orina de color amarillo plido.  Tomar medicamentos recetados o de [employer].  Consumir alimentos ricos en [place], como  frijoles, cereales integrales, y frutas y verduras frescas.  Limitar el consumo de alimentos ricos en grasa y azcares procesados, como los alimentos fritos o dulces. Instrucciones generales  Siga las instrucciones del mdico respecto de las restricciones en las comidas o las bebidas.  No beba alcohol ni consuma drogas.  No consuma ningn producto  que contenga nicotina o tabaco, como cigarrillos, cigarrillos electrnicos y tabaco de Higher education careers adviser. Estos pueden retrasar la cicatrizacin de la incisin despus de la ciruga. Si necesita ayuda para dejar de fumar, consulte al mdico.  Concurra a todas las visitas de seguimiento como se lo haya indicado el mdico. Esto es importante. Comunquese con un mdico si:  Tiene fiebre.  Tiene cualquiera de estos signos de infeccin: ? Aumento del enrojecimiento, la hinchazn o el dolor alrededor de la incisin. ? Lquido o sangre que salen de la incisin. ? Calor que proviene de la incisin. ? Pus o mal olor en TEFL teacher de la incisin. Solicite ayuda de inmediato si:  Tiene sntomas de un accidente cerebrovascular. "BE FAST" es una manera fcil de recordar los principales signos de advertencia de un accidente cerebrovascular: ? B - Balance (equilibrio). Los signos son mareos, dificultad repentina para caminar o prdida del equilibrio. ? E - Eyes (ojos). Los signos son problemas para ver o un cambio repentino en la visin. ? F - Face (rostro). Los signos son debilidad repentina o adormecimiento del rostro, o el rostro o el prpado que se caen hacia un lado. ? A - Arms (brazos). Los signos son debilidad o adormecimiento en un brazo. Esto sucede de repente y generalmente en un lado del cuerpo. ? S - Speech (habla). Los signos son dificultad para hablar, hablar arrastrando las palabras o dificultad para comprender lo que la gente dice. ? T - Time (tiempo). Es tiempo de llamar al servicio de Multimedia programmer. Anote la hora a la que [employer] sntomas.  Presenta otros signos de un accidente cerebrovascular, como los siguientes: ? Dolor de cabeza sbito e intenso que no tiene causa aparente. ? Nuseas o vmitos. ? Convulsiones.  Se desmaya.  Se golpea la cabeza. Estos sntomas pueden representar un problema grave que constituye Engineer, maintenance (IT). No espere a ver si los sntomas desaparecen. Solicite atencin  mdica de inmediato. Comunquese con el servicio de emergencias de su localidad (911 en los [place]). No conduzca por sus propios medios [employer] Financial. Resumen  Despus de una craneotoma, es comn usar un casco para proteger el cerebro.  Si tiene un casco, selo como se lo haya indicado el mdico. Quteselas solamente como se lo haya indicado el mdico.  Retome sus actividades normales como se lo haya indicado el mdico. Pregntele al mdico qu actividades son seguras para usted.  Use los medicamentos de venta libre y los recetados solamente como se lo haya indicado el mdico.  Consulting civil engineer a todas las visitas de seguimiento como se lo haya indicado el mdico. Esto es importante. Esta informacin no tiene Marine scientist el consejo del mdico. Asegrese de hacerle al mdico cualquier pregunta que tenga. Document Revised: 09/12/2018 Document Reviewed: 09/12/2018 Elsevier Patient Education  2020 [place] and [place] Neurological [facility] (pp. [phone]). [place], [place]. Elsevier."> Neurosurgery, 80(1), 6-15. Retrieved on August 27, 2018.[email or web address]"> Primary Care (5th ed., pp. 218-221). [place], [place]: Elsevier."> Brain Injury, 29(6), [phone]. [email or web address]"> Rosen's Emergency Medicine: Concepts and Clinical Practice (9th ed., pp. 301-329). [place], [place]: Elsevier.">  Lesin en la cabeza, en adultos Head Injury, Adult Hay muchos tipos de  lesiones en la cabeza. Pueden ser tan leves como un chichn o pueden ser un problema mdico grave. Algunas de las lesiones graves en la cabeza son:  Robbi Garter que provoque un impacto en el cerebro (conmocin cerebral).  Moretn (contusin) en el cerebro. Esto significa que hay un sangrado en el cerebro que puede causar hinchazn.  Fisura en el crneo (fractura de crneo).  Hemorragia en el cerebro que se acumula, se coagula y forma una protuberancia (hematoma). Despus de  una lesin en la cabeza, la mayora de los problemas ocurren dentro de las primeras 24horas, pero los efectos secundarios pueden aparecer entre 7 y 10das despus de la lesin. Es importante controlar su afeccin para ver si hay cambios. Es posible que deban observarlo en el departamento de emergencias o en el servicio de atencin urgente, o tambin puede ser que deban hospitalizarlo. Cules son las causas? Hay muchas causas posibles de una lesin en la cabeza. Una lesin grave en la cabeza puede deberse a un accidente automovilstico, a accidentes en bicicleta o motocicleta, a lesiones deportivas y a cadas. Cules son los sntomas? Los sntomas de lesin en la cabeza incluyen una contusin, un chichn o sangrado en el lugar de la lesin. Otros sntomas fsicos pueden ser:  Dolor de [place].  Nuseas o vmitos.  Mareos.  Cansancio.  Sentir incomodidad cerca de luces brillantes o ruidos fuertes.  Convulsiones.  Dificultad para despertarse.  Desmayos. Los sntomas mentales o emocionales pueden incluir los siguientes:  Irritabilidad.  Confusin y problemas de memoria.  Falta de atencin y Estate manager/land agent.  Cambios en los hbitos de alimentacin o en el sueo.  Sentir ansiedad o depresin. Cmo se diagnostica? Esta afeccin suele diagnosticarse en funcin de los sntomas, de una descripcin de la lesin y de un examen fsico. Tambin pueden hacerle estudios de diagnstico por imgenes, como una resonancia magntica (RM) o una exploracin por tomografa computarizada (TC). Cmo se trata? El tratamiento de esta afeccin depende de la gravedad y el tipo de lesin que sufri. [place] principal del tratamiento es prevenir complicaciones y darle tiempo al cerebro para que se recupere. Lesin de cabeza leve Si usted sufre lesin leve en la cabeza, es posible que lo enven a casa, y el tratamiento puede incluir lo siguiente:  Observacin. Un adulto responsable debe quedarse con  usted durante 24horas despus de producida la lesin y controlarlo con frecuencia.  Descanso fsico.  Descanso cerebral.  Analgsicos. Lesin de [place] grave Si tiene una lesin grave en la cabeza, el tratamiento puede incluir lo siguiente:  Observacin minuciosa. Esto incluye hospitalizacin con exmenes fsicos frecuentes.  Medicamentos para Best boy, prevenir las convulsiones y disminuir la hinchazn del cerebro.  Asistencia respiratoria. Esto puede incluir un respirador.  Tratamientos para controlar la hinchazn dentro del cerebro.  Ciruga de cerebro. Esta puede ser [employer] siguientes casos: ? Extraer un cogulo de [place]. ? Interrumpir el sangrado. ? Retirar una parte del crneo para dejar espacio a fin de que el cerebro se hinche. Siga estas instrucciones en su casa: Actividad  Descanse y evite actividades que sean fsicamente difciles o agotadoras.  Asegrese de dormir lo suficiente.  Limite las actividades que requieran pensar mucho o prestar atencin, como las siguientes: ? Psychologist, counselling televisin. ? Jugar juegos de memoria y armar rompecabezas. ? Tareas para el hogar o trabajos relacionados con el empleo. ? Nurse, adult computadora, usar las redes sociales y enviar mensajes de texto.  Evite actividades que puedan causar otra lesin en la  cabeza, como practicar deportes, hasta que el mdico lo autorice. Puede ser peligroso tener otra lesin en la cabeza antes de que se haya recuperado de la primera.  Pregntele al mdico cundo puede retomar sus actividades habituales, incluidos el trabajo o [place]. Pdale al mdico un plan escalonado para retomar sus actividades de forma gradual.  Consulte a su mdico sobre cundo puede conducir, Catering manager o usar maquinaria pesada. La capacidad para reaccionar puede ser ms lenta luego de una lesin cerebral. No realice estas actividades si se siente mareado. Estilo de vida   No beba alcohol hasta que  el mdico lo autorice. No consuma drogas. El alcohol y ciertas drogas pueden demorar su recuperacin y ponerlo en riesgo de nuevas lesiones.  Si le resulta ms difcil que lo habitual recordar las cosas, escrbalas.  Trate de hacer una cosa por vez si se distrae con facilidad.  Consulte con familiares y amigos si debe tomar decisiones importantes.  [place] sobre su lesin, los sntomas y las restricciones a sus amigos, a su familia, a un colega de confianza y a su gerente en el trabajo. Pdales que observen si aparecen nuevos problemas o empeoran los existentes. Instrucciones generales  [employer] de venta libre y los recetados solamente como se lo haya indicado el mdico.  Pdale a alguien que lo acompae durante 24horas despus de la lesin en la cabeza. Esta persona debe observar cambios en los sntomas y estar preparada para obtener ayuda mdica.  Concurra a todas las visitas de seguimiento como se lo haya indicado el mdico. Esto es importante. Cmo se evita?  Trabaje para mejorar el equilibrio y la fuerza a fin de Product/process development scientist cadas.  Use el cinturn de seguridad cuando se encuentre en un vehculo en movimiento.  Use un casco al andar en bicicleta, esquiar o practicar cualquier otro deporte o actividad que tenga riesgo de lesiones.  Si bebe alcohol: ? Limite la cantidad que bebe:  De 0 a 1 medida por da para las mujeres.  De 0 a 2 medidas por da para los hombres. ? Est atento a la cantidad de alcohol que hay en las bebidas que toma. En los [place], una medida equivale a una botella de cerveza de 12oz (34ml), un vaso de vino de 5oz (182ml) o un vaso de una bebida alcohlica de alta graduacin de 1oz (90ml).  Tome medidas de seguridad en su hogar, por ejemplo: ? Mantenga los pisos y las escaleras en orden. ? Coloque barras para sostn en los baos y [employer] en las escaleras. ? Ponga alfombras antideslizantes en pisos y baeras. ? Mejore la  iluminacin en zonas de penumbra. Solicite ayuda inmediatamente si:  Tiene lo siguiente: ? Dolor de cabeza intenso que no desaparece con los medicamentos. ? Dificultad para caminar o debilidad en los brazos o las piernas. ? Secrecin transparente o con sangre que proviene de la nariz o de los odos. ? Cambios en la visin. ? Una convulsin.  Pierde el equilibrio.  Vomita.  Las pupilas [place] de [place].  Arrastra las palabras.  Su mareo empeora.  Se desmaya.  Est ms somnoliento de lo normal o tiene dificultad para mantenerse despierto.  Sus sntomas empeoran. Estos sntomas pueden representar un problema grave que constituye Engineer, maintenance (IT). No espere a ver si los sntomas desaparecen. Solicite atencin mdica de inmediato. Comunquese con el servicio de emergencias de su localidad (911 en los [place]). No conduzca por sus propios medios [place]  lesiones en la cabeza pueden ser leves o pueden ser un problema mdico grave que requiere atencin inmediata.  El tratamiento de esta afeccin depende de la gravedad y el tipo de lesin que sufri.  Pregntele al mdico cundo puede retomar sus actividades habituales, incluidos el trabajo o [place].  La prevencin de lesiones en la cabeza incluye el uso de cinturn de seguridad en un vehculo motorizado, el uso de casco en bicicleta, limitar el consumo de alcohol y tomar medidas de seguridad Financial planner. Esta informacin no tiene Marine scientist el consejo del mdico. Asegrese de hacerle al mdico cualquier pregunta que tenga. Document Revised: 04/17/2018 Document Reviewed: 04/17/2018 Elsevier Patient Education  [place].

## 2019-10-11 NOTE — Progress Notes (Signed)
[place], [place]  [address] Phone:  [phone]   Fax:  [phone]   New Patient Office Visit  Subjective:  Patient ID: Jason Skinner, male    DOB: Nov 07, 1990  Age: 29 y.o. MRN: [id number]  CC:  Chief Complaint  Patient presents with   Follow-up    Pt states he is here for his f/u Pt states he has concerns about his R eye .Pt wife wants to know how his health is doing.   Headache    Pt was released from hospital on 10/05/19    HPI [name] presents to established care. 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 He suffered loss of concussion. His Cranial CT scan indicated a right cerebral hematoma with leftward midline shift of 2 cn and a right uncal herniation. Left temporal pole contusion with overlying subarachnoid hemorrhage. Occipital and clival midline fractures extending into the right sphenoid sinus with pneumocephalus. He is SP right frontal temporal parietal craniectomy and insertion of craniotomy flap into the abdominal subq tissue. He was extubated on 09/10/2019. He was admitted to [facility] on 09/20/2019 for inpt therapy PT, OT and ST. He did have some urinary retention which has resolved. He was on Urecholine however he is no longer taking. He was discharged on 10/05/2019.  He has not been able to undergo outpt therapy due to his lack of insurance. He currently has a lawsuit to get insurance coverage so that he can get his need treatment. He is in today with Kizzie Furnish brother-in-law. He is wife is on via facetime and the spanish interrupter is present. Mrs [name] is concern about his eye and overall health.  He suffered an episode of emesis this afternoon while downstairs. His caregiver denies any problems with emesis. He does continue to experience headaches and right eye heaviness. He is eating and hydrating without difficulty. Mr [name] questioned whether or not Mr [name] still needs  assistance with toleiting and bathing. He tries to do things on his own however does stumble etc.   He is following up with neurology in a few weeks.   History reviewed. No pertinent past medical history.  Past Surgical History:  Procedure Laterality Date   CRANIOTOMY Right 09/05/2019   Procedure: CRANIOTOMY HEMATOMA EVACUATION SUBDURAL WITH  PLACEMENT OF BONE FLAP IN ABDOMEN.;  Surgeon: Newman Pies, MD;  Location: [facility];  Service: Neurosurgery;  Laterality: Right;  right    History reviewed. No pertinent family history.  Social History   Socioeconomic History   Marital status: Married    Spouse name: Not on file   Number of children: Not on file   Years of education: Not on file   Highest education level: Not on file  Occupational History   Not on file  Tobacco Use   Smoking status: Never Smoker   Smokeless tobacco: Never Used  Vaping Use   Vaping Use: Never used  Substance and Sexual Activity   Alcohol use: Not Currently   Drug use: Not Currently   Sexual activity: Not Currently  Other Topics Concern   Not on file  Social History Narrative   Wife and 2 children live in [place].   Social Determinants of Health   Financial Resource Strain:    Difficulty of Paying Living Expenses:   Food Insecurity:    Worried About Charity fundraiser in the Last Year:  Ran Out of Food in the Last Year:   Transportation Needs:    Film/video editor (Medical):    Lack of Transportation (Non-Medical):   Physical Activity:    Days of Exercise per Week:    Minutes of Exercise per Session:   Stress:    Feeling of Stress :   Social Connections:    Frequency of Communication with Friends and Family:    Frequency of Social Gatherings with Friends and Family:    Attends Religious Services:    Active Member of Clubs or Organizations:    Attends Music therapist:    Marital Status:   Intimate Partner Violence:    Fear of Current or  Ex-Partner:    Emotionally Abused:    Physically Abused:    Sexually Abused:     ROS Review of Systems  Objective:   Today's Vitals: BP 103/66 (BP Location: Left Arm, Patient Position: Sitting, Cuff Size: Normal)    Pulse 75    Temp 98 F (36.7 C)    Resp 16    Ht 5\' 7"  (1.702 m)    Wt 144 lb 9.6 oz (65.6 kg)    SpO2 100%    BMI 22.65 kg/m   Physical Exam HENT:     Head:     Comments: Right frontal crainectomy    Mouth/Throat:     Mouth: Mucous membranes are moist.  Eyes:     General: Visual field deficit present.  Cardiovascular:     Rate and Rhythm: Normal rate and regular rhythm.     Heart sounds: Normal heart sounds.  Pulmonary:     Effort: Pulmonary effort is normal.     Breath sounds: Normal breath sounds.  Abdominal:     Palpations: Abdomen is soft.     Tenderness: There is abdominal tenderness.     Comments: Hypoactive Right upper quadrant surgical incision well healed.   Musculoskeletal:        General: Normal range of motion.  Neurological:     Mental Status: He is alert.     Cranial Nerves: Cranial nerve deficit and facial asymmetry present.     Sensory: Sensation is intact.     Motor: Motor function is intact.     Gait: Gait abnormal.     Comments: Impaired EOM left     Assessment & Plan:   Problem List Items Addressed This Visit      Cardiovascular and Mediastinum   Vascular headache      Nervous and Auditory   SDH (subdural hematoma) (HCC) - Primary Follow up with neurology      Genitourinary   Urinary retention Resolved continue to monitor if any changes will restart  Urecholine    Other Visit Diagnoses    Hospital discharge follow-up     Encouraged caregiver to perform home therapy that is possible Continue to assist patient as needed to prevent falls related to his cranial nerve deficits Social worker to contact patient next week to assist with additional needs      Outpatient Encounter Medications as of 10/11/2019  Medication  Sig   acetaminophen (TYLENOL) 500 MG tablet Take 500 mg by mouth every 6 (six) hours as needed for headache.    bethanechol (URECHOLINE) 10 MG tablet Take 1 tablet (10 mg total) by mouth 3 (three) times daily.   lidocaine (LIDODERM) 5 % Place 1 patch onto the skin daily. Remove & Discard patch within 12 hours or as directed by MD  meclizine (ANTIVERT) 12.5 MG tablet Take 1 tablet (12.5 mg total) by mouth 3 (three) times daily as needed for dizziness.   methocarbamol (ROBAXIN) 500 MG tablet Take 1 tablet (500 mg total) by mouth every 8 (eight) hours as needed for muscle spasms.   oxyCODONE (OXY IR/ROXICODONE) 5 MG immediate release tablet Take 1 tablet (5 mg total) by mouth every 6 (six) hours as needed for moderate pain.   polyethylene glycol (MIRALAX / GLYCOLAX) 17 g packet Take 17 g by mouth daily.   topiramate (TOPAMAX) 25 MG tablet Take 3 tablets (75 mg total) by mouth 2 (two) times daily.   No facility-administered encounter medications on file as of 10/11/2019.    Follow-up: Return in about 2 months (around 12/11/2019).   Vevelyn Francois, NP

## 2019-10-16 ENCOUNTER — Telehealth: Payer: Self-pay | Admitting: Clinical

## 2019-10-16 NOTE — Telephone Encounter (Signed)
Integrated Behavioral Health Case Management Referral Note  10/16/2019 Name: Jason Skinner MRN: [id number] DOB: 11-24-1990 Jason Skinner is a 29 y.o. year old male who sees Patient, No Pcp Per for primary care. LCSW was consulted to assess patient's needs and assist the patient with Financial Difficulties related to no health coverage.  Interpreter: Yes.     Interpreter Name & Language: Johnsie Cancel [phone], Spanish  Assessment: Patient experiencing Financial constraints related to no health coverage. Recently discharged from inpatient rehab. CSW received referral that patient needs to apply for [employer]. Consulted with inpatient rehab social worker who indicated patient Medicaid pending, likely started while patient admitted in the hospital before rehab. CSW called patient's brother, Jason Skinner, with interpreter and discussed options. Patient cannot apply for [employer] and [facility] financial assistance until [place] determination is made. Jason Skinner expressed understanding. CSW will continue to follow and assist as needed.  Review of patient status, including review of consultants reports, relevant laboratory and other test results, and collaboration with appropriate care team members and the patient's provider was performed as part of comprehensive patient evaluation and provision of services.    SDOH (Social Determinants of Health) assessments performed: No    Outpatient Encounter Medications as of 10/16/2019  Medication Sig Note  . acetaminophen (TYLENOL) 500 MG tablet Take 500 mg by mouth every 6 (six) hours as needed for headache.    . bethanechol (URECHOLINE) 10 MG tablet Take 1 tablet (10 mg total) by mouth 3 (three) times daily.   Marland Kitchen lidocaine (LIDODERM) 5 % Place 1 patch onto the skin daily. Remove & Discard patch within 12 hours or as directed by MD   . meclizine (ANTIVERT) 12.5 MG tablet Take 1 tablet (12.5 mg total) by mouth 3 (three) times daily as needed for dizziness.   . methocarbamol (ROBAXIN) 500 MG  tablet Take 1 tablet (500 mg total) by mouth every 8 (eight) hours as needed for muscle spasms. 10/13/2019: completed  . oxyCODONE (OXY IR/ROXICODONE) 5 MG immediate release tablet Take 1 tablet (5 mg total) by mouth every 6 (six) hours as needed for moderate pain.   . polyethylene glycol (MIRALAX / GLYCOLAX) 17 g packet Take 17 g by mouth daily.   Marland Kitchen topiramate (TOPAMAX) 25 MG tablet Take 3 tablets (75 mg total) by mouth 2 (two) times daily.    No facility-administered encounter medications on file as of 10/16/2019.    Goals Addressed   None      Follow up Plan: 1. Confirming patient's Medicaid pending status 2. CSW available to assist with [employer] and [facility] financial assistance appointment if denied for Medicaid  Estanislado Emms, [facility] [phone]

## 2019-10-18 ENCOUNTER — Telehealth: Payer: Self-pay | Admitting: Clinical

## 2019-10-18 ENCOUNTER — Other Ambulatory Visit: Payer: Self-pay | Admitting: Family Medicine

## 2019-10-18 ENCOUNTER — Telehealth: Payer: Self-pay

## 2019-10-18 MED ORDER — IBUPROFEN 800 MG PO TABS
800.0000 mg | ORAL_TABLET | Freq: Three times a day (TID) | ORAL | 0 refills | Status: DC | PRN
Start: 2019-10-18 — End: 2019-10-18

## 2019-10-18 MED ORDER — ACETAMINOPHEN 500 MG PO TABS: 1000.0000 mg | ORAL_TABLET | Freq: Four times a day (QID) | ORAL | 0 refills | Status: DC | PRN

## 2019-10-18 MED ORDER — TRAMADOL HCL 50 MG PO TABS: 50.0000 mg | ORAL_TABLET | Freq: Three times a day (TID) | ORAL | 0 refills | Status: AC | PRN

## 2019-10-18 NOTE — Telephone Encounter (Signed)
Integrated Behavioral Health General Follow Up Note  10/18/2019 Name: Maris Bena MRN: [id number] DOB: 10-24-1990 [name] is a 29 y.o. year old male who sees Patient, No Pcp Per for primary care. LCSW was initially consulted to assess patient's needs and assist the patient with financial difficulties related to no health coverage  Interpreter: Yes.     Interpreter Name & Language: Thayer Jew #[phone], Spanish  Ongoing Intervention: Patient experiencing financial difficulties related to no health coverage. Today CSW called patient's brother [name] again to update on [place] application information. Financial counselors in hospital assisted patient with this application while admitted. [name] also had questions about medication refill for patient; referred to LPN/CMA.   Review of patient status, including review of consultants reports, relevant laboratory and other test results, and collaboration with appropriate care team members and the patient's provider was performed as part of comprehensive patient evaluation and provision of services.     Follow up Plan: 1. CSW available to assist with [employer] [facility] financial assistance application if denied for Medicaid   Estanislado Emms, [facility] [phone]

## 2019-10-18 NOTE — Progress Notes (Signed)
Jason Skinner is a 29 year old male that was discharged from inpatient services on 10/04/2019 with a diagnosis of vascular headache. For pain control, oxycodone 5 mg #30 was sent to patient's pharmacy. Patient is requesting refill on oxycodone. Patient's PCP is out of clinic, will send tramadol 50 mg every 6 hours #15. Schedule first available follow-up. Also, patient is scheduled to follow-up with neurosurgery. Recommend Tylenol 1000 mg every 6 hours as needed for mild to moderate pain.  Meds ordered this encounter  Medications  . DISCONTD: ibuprofen (ADVIL) 800 MG tablet    Sig: Take 1 tablet (800 mg total) by mouth every 8 (eight) hours as needed.    Dispense:  30 tablet    Refill:  0    Order Specific Question:   Supervising Provider    Answer:   Tresa Garter [id number]  . acetaminophen (TYLENOL) 500 MG tablet    Sig: Take 2 tablets (1,000 mg total) by mouth every 6 (six) hours as needed for headache.    Dispense:  30 tablet    Refill:  0    Order Specific Question:   Supervising Provider    Answer:   Tresa Garter [id number]  . traMADol (ULTRAM) 50 MG tablet    Sig: Take 1 tablet (50 mg total) by mouth every 8 (eight) hours as needed for up to 5 days.    Dispense:  15 tablet    Refill:  0    Order Specific Question:   Supervising Provider    Answer:   Tresa Garter [[id number]]    Donia Pounds  APRN, MSN, FNP-C Patient [facility] [address] [place], [place] [address] [phone]

## 2019-10-18 NOTE — Telephone Encounter (Signed)
-----   Message from Estanislado Emms, LCSW sent at 10/18/2019 11:27 AM EDT ----- Aileen Pilot, I was just on the phone with this patient's brother regarding a Medicaid application. He then asked me about a medication refill (I believe for pain meds). I don't think [facility] filled it, I believe it was given to him when he was discharged from the hospital recently. But I wasn't sure if there's anything we can do or where to direct him. Would you be able to call the brother back about this? The patient had a TBI and the social worker at inpatient rehab said he gets confused, so it's best to talk to brother. I've been talking to brother Sid Falcon and he needs a Spanish interpreter. Let me know, thanks!

## 2019-10-18 NOTE — Telephone Encounter (Signed)
Patient asking for a refill on medication for pain. Recently established with [facility]. Please advise.

## 2019-10-18 NOTE — Telephone Encounter (Signed)
Called and made aware that tramadol has been sent in for pt. Thanks!

## 2019-10-24 ENCOUNTER — Ambulatory Visit: Payer: Self-pay | Attending: Physician Assistant | Admitting: Physical Therapy

## 2019-10-24 ENCOUNTER — Other Ambulatory Visit: Payer: Self-pay

## 2019-10-24 ENCOUNTER — Encounter: Payer: Self-pay | Admitting: Speech Pathology

## 2019-10-24 ENCOUNTER — Ambulatory Visit: Payer: Self-pay | Admitting: Speech Pathology

## 2019-10-24 DIAGNOSIS — R42 Dizziness and giddiness: Secondary | ICD-10-CM | POA: Insufficient documentation

## 2019-10-24 DIAGNOSIS — R2681 Unsteadiness on feet: Secondary | ICD-10-CM | POA: Insufficient documentation

## 2019-10-24 DIAGNOSIS — M6281 Muscle weakness (generalized): Secondary | ICD-10-CM | POA: Insufficient documentation

## 2019-10-24 DIAGNOSIS — R41841 Cognitive communication deficit: Secondary | ICD-10-CM | POA: Insufficient documentation

## 2019-10-24 DIAGNOSIS — R2689 Other abnormalities of gait and mobility: Secondary | ICD-10-CM | POA: Insufficient documentation

## 2019-10-24 DIAGNOSIS — R29818 Other symptoms and signs involving the nervous system: Secondary | ICD-10-CM | POA: Insufficient documentation

## 2019-10-24 DIAGNOSIS — R293 Abnormal posture: Secondary | ICD-10-CM | POA: Insufficient documentation

## 2019-10-24 NOTE — Therapy (Signed)
[facility] [address] [place], [place], [address] Phone: [phone]   Fax:  [phone]  Physical Therapy Evaluation  Patient Details  Name: Jason Skinner MRN: [id number] Date of Birth: October 13, 1990 Referring Provider (PT): Angiulli, Lavon Paganini, PA-C (will be followed by Dr. Ranell Patrick)   Encounter Date: 10/24/2019   PT End of Session - 10/24/19 1143    Visit Number 1    Number of Visits 17    Date for PT Re-Evaluation 01/22/20    Authorization Type Medicaid pending? Will also be reaching out to [employer]    PT Start Time [phone]   therapist running late   PT Stop Time [phone]    PT Time Calculation (min) 41 min    Equipment Utilized During Treatment Gait belt    Activity Tolerance Patient tolerated treatment well   dizziness with head motions during gait   Behavior During Therapy [facility] for tasks assessed/performed           No past medical history on file.  Past Surgical History:  Procedure Laterality Date  . CRANIOTOMY Right 09/05/2019   Procedure: CRANIOTOMY HEMATOMA EVACUATION SUBDURAL WITH  PLACEMENT OF BONE FLAP IN ABDOMEN.;  Surgeon: Newman Pies, MD;  Location: [place];  Service: Neurosurgery;  Laterality: Right;  right    There were no vitals filed for this visit.    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 Per report +LOC after fall, then patient was alert and oriented. He decompensated with EMS and was upgraded to a level 1, intubated en route. Found to have Right subdural hematoma, occipital skull fracture, clivus fracture, L1/2 R tranverse process fx, traumatic brain injury. Underwent  Right frontotemporoparietal craniectomy for evacuation of acute subdural hematoma; insertion of right frontal ventriculostomy; insertion of craniotomy flap into the abdominal subcutaneous  tissue. Extubated 7/5.  Pt hospitalized 09/05/19 to 10/05/19 including CIR.   Helmet when out of bed. Had increased dizziness when at home in the hospital, still having some, but it has gotten better.    Patient is accompained by: Family member;Interpreter   spanish, with 2 brothers   Limitations Walking    Currently in Pain? Yes    Pain Score 5     Pain Location Head   back of head   Pain Orientation Right    Pain Descriptors / Indicators Headache    Aggravating Factors  a cold environment    Pain Relieving Factors medication              [facility] PT Assessment - 10/24/19 0817      Assessment   Medical Diagnosis traumatic SDH    Referring Provider (PT) Cathlyn Parsons, PA-C   will be followed by Dr. Ranell Patrick   Onset Date/Surgical Date 09/05/19    Hand Dominance Right    Prior Therapy CIR      Precautions   Precautions Fall    Precaution Comments soft helmet OOB      Balance Screen   Has the patient fallen in the past 6 months No    Has the patient had a decrease in activity level because of a fear of falling?  Yes    Is the patient reluctant to leave their home because of a fear of falling?  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    Leisure likes walking and going to the gym      Cognition   Overall Cognitive Status Impaired/Different from baseline      Sensation   Light Touch Appears Intact      Coordination    Gross Motor Movements are Fluid and Coordinated No    Fine Motor Movements are Fluid and Coordinated No    Finger Nose Finger Test impaired BUE, R>L    Heel Shin Test more slowed movement      Posture/Postural Control   Posture/Postural Control Postural limitations    Posture Comments leans posterior and to the left when testing strength and coordination and at end of session with incr sitting      ROM / Strength   AROM / PROM / Strength Strength      Strength   Strength Assessment Site Hip;Knee;Ankle    Right/Left Hip Right;Left    Right Hip Flexion 4/5    Left Hip Flexion 4+/5    Right/Left Knee Right;Left    Right Knee Flexion 4/5    Right Knee Extension 4+/5    Left Knee Flexion 4+/5    Left Knee Extension 4+/5    Right/Left Ankle Right;Left    Right Ankle Dorsiflexion 4+/5    Left Ankle Dorsiflexion 5/5      Transfers   Transfers Stand to Sit;Sit to Stand    Sit to Stand 5: Supervision;Without upper extremity assist;From bed    Five time sit to stand comments  17.56 seconds    Stand to Sit 5: Supervision;Without upper extremity assist;To bed      Ambulation/Gait   Ambulation/Gait Yes    Ambulation/Gait Assistance 4: Min guard;5: Supervision    Ambulation/Gait Assistance Details min guard at times to avoid bumping into objects on L, pt reporting dizziness during head motions during DGI    Ambulation Distance (Feet) --   clinic distances throughout session   Assistive device None    Gait Pattern Step-through pattern;Decreased arm swing - right;Decreased arm swing - left;Narrow base of support;Ataxic   wavering to L at times   Ambulation Surface Level;Indoor    Gait velocity 8.66 seconds in 20 ft = 2.31 ft/sec     Stairs Yes    Stairs Assistance 4: Min guard;5: Supervision    Stairs Assistance Details (indicate cue type and reason) ascending stairs with reciprocal pattern and single handrail, descending stairs with B handrails and step to pattern    Number of Stairs 4      Height of Stairs 6    Pre-Gait Activities with static standing pt standing with a wider BOS for balance    Gait Comments --      Standardized Balance Assessment   Standardized Balance Assessment Dynamic Gait Index      Dynamic Gait Index   Level Surface Mild Impairment    Change in Gait  Speed Mild Impairment    Gait with Horizontal Head Turns Moderate Impairment   incr dizziness   Gait with Vertical Head Turns Severe Impairment   incr dizziness   Gait and Pivot Turn Mild Impairment    Step Over Obstacle Moderate Impairment    Step Around Obstacles Mild Impairment    Steps Moderate Impairment    Total Score 11    DGI comment: 11/24   no AD                     Objective measurements completed on examination: See above findings.               PT Education - 10/24/19 1141    Education Details POC, clinical findings (fall risk), answered pt's brothers questions about needing to call their PCP for medication questions and re-fills    Person(s) Educated Patient   brothers   Methods Explanation   spanish interpreter needed   Comprehension Verbalized understanding;Need further instruction            PT Short Term Goals - 10/24/19 1150      PT SHORT TERM GOAL #1   Title Pt will be independent with initial HEP in order to build upon functional gains made in therapy. ALL STGS DUE 12/05/19    Time 6   due to delay in scheduling   Period Weeks    Status New    Target Date 12/05/19      PT SHORT TERM GOAL #2   Title Pt will improve DGI score to at least a 14/24 in order to demo decr fall risk.    Baseline 11/24    Time 6    Period Weeks    Status New      PT SHORT TERM GOAL #3   Title Pt will ambulate at least 300' over level indoor surfaces with supervision in order to demo improved mobility.    Baseline min guard at times due to almost running into objects on L    Time 6    Period Weeks    Status New      PT SHORT TERM GOAL #4   Title Pt will  improve gait speed to at least 2.7 ft/sec in order to demo improved community mobility.    Baseline 2.31 ft/sec    Time 6    Period Weeks    Status New             PT Long Term Goals - 10/24/19 1150      PT LONG TERM GOAL #1   Title Pt will be independent with final HEP in order to build upon functional gains made in therapy. ALL LTGS DUE 01/02/20    Time 10   due to delay in scheduling   Period Weeks    Status New    Target Date 01/02/20      PT LONG TERM GOAL #2   Title Pt will improve gait speed to at least 3.1 ft/sec in order to demo improved community mobility.    Baseline 2.31 ft/sec    Time 10    Period Weeks    Status New      PT LONG TERM GOAL #3   Title Pt will improve DGI score to at least a 18/24 in order to demo decr fall risk.    Baseline 11/24    Time 10    Period Weeks    Status New  PT LONG TERM GOAL #4   Title Pt will ambulate at least 300' over unlevel outdoor surfaces with supervision in order to demo improved community mobility.    Time 10    Period Weeks    Status New      PT LONG TERM GOAL #5   Title Pt will ascend 4 stairs with reciprocal pattern and no handrail and descend 4 stairs with single handrail vs. no handrail with reciprocal pattern in order to demo improved functional BLE strength.    Time 10    Period Weeks    Status New                  Plan - 10/24/19 1208    Clinical Impression Statement Patient is a 29 year old male referred to Neuro OPPT. 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 Per report +LOC after fall, then patient was alert and oriented. He decompensated with EMS and was upgraded to a level 1, intubated en route. Found to have Right subdural hematoma, occipital skull fracture, clivus fracture, L1/2 R tranverse process fx, traumatic brain injury. Underwent  Right frontotemporoparietal craniectomy for evacuation of acute subdural  hematoma; insertion of right frontal ventriculostomy; insertion of craniotomy flap into the abdominal subcutaneous tissue.  The following deficits were present during the exam:  impaired balance, postural abnormalities, gait abnormalities, decr BUE and BLE coordination, decr BLE strength, dizziness with head motions, decr attention to L side during gait. Based on DGI pt is at a high risk for falls and pt's gait speed indicates that pt is a limited community ambulator. 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   Examination-Activity Limitations Transfers;Stairs;Stand;Locomotion Level    Examination-Participation Restrictions Community Activity;Occupation    Stability/Clinical Decision Making Stable/Uncomplicated    Clinical Decision Making Low    Rehab Potential Good    PT Frequency --   1-2 week   PT Duration 8 weeks    PT Treatment/Interventions ADLs/Self Care Home Management;Therapeutic activities;Functional mobility training;Stair training;Gait training;Therapeutic exercise;Balance training;Neuromuscular re-education;Patient/family education;Vestibular;Visual/perceptual remediation/compensation    PT Next Visit Plan initial HEP for balance and functional BLE strength, further assess vestibular system.    Consulted and Agree with Plan of Care Patient;Family member/caregiver           Patient will benefit from skilled therapeutic intervention in order to improve the following deficits and impairments:  Abnormal gait, Decreased activity tolerance, Decreased coordination, Decreased balance, Decreased safety awareness, Decreased strength, Difficulty walking, Dizziness, Postural dysfunction, Impaired vision/preception  Visit  Diagnosis: Unsteadiness on feet  Dizziness and giddiness  Other abnormalities of gait and mobility  Abnormal posture  Other symptoms and signs involving the nervous system  Muscle weakness (generalized)     Problem List Patient Active Problem List   Diagnosis Date Noted  . Superficial venous thrombosis of arm, unspecified laterality   . Vascular headache   . Acute blood loss anemia   . Thrombocytosis ([place])   . Urinary retention   . Postoperative pain   . Urinary incontinence   . Dysphagia   . Traumatic subdural hematoma ([place]) 09/20/2019  . SDH (subdural hematoma) ([place]) 09/05/2019    Arliss Journey, PT, DPT  10/24/2019, 12:21 PM   [facility] [address] [place], [place], [address] Phone: [phone]   Fax:  [phone]  Name: Abir Craine MRN: [id number] Date of Birth: 1990-04-09

## 2019-10-24 NOTE — Therapy (Signed)
[facility] [address] [place], [place], [address] Phone: [phone]   Fax:  [phone]  Speech Language Pathology Evaluation  Patient Details  Name: Jason Skinner MRN: [id number] Date of Birth: 1990-11-24 Referring Provider (SLP): Dr. Alger Simons   Encounter Date: 10/24/2019   End of Session - 10/24/19 1009    Visit Number 1    Number of Visits 17    Date for SLP Re-Evaluation 12/19/19    Authorization Type await WC vs cone financial aid    SLP Start Time 0849    SLP Stop Time  0940    SLP Time Calculation (min) 51 min    Activity Tolerance Other (comment)   increase in h/a and reduced balance after cognitive eval          History reviewed. No pertinent past medical history.  Past Surgical History:  Procedure Laterality Date  . CRANIOTOMY Right 09/05/2019   Procedure: CRANIOTOMY HEMATOMA EVACUATION SUBDURAL WITH  PLACEMENT OF BONE FLAP IN ABDOMEN.;  Surgeon: Newman Pies, MD;  Location: [facility];  Service: Neurosurgery;  Laterality: Right;  right    There were no vitals filed for this visit.   Subjective Assessment - 10/24/19 0958    Subjective "Jason Skinner"    Patient is accompained by: Family member;Interpreter   brother, Jason Skinner; boss Jason Skinner [employer]; interpreter, Juliann Pulse   Currently in Pain? Yes    Pain Score 4     Pain Location Head    Pain Orientation Right    Pain Descriptors / Indicators Headache    Pain Type Chronic pain    Pain Onset More than a month ago    Pain Frequency Constant    Aggravating Factors  light, noise    Pain Relieving Factors rest, meds    Effect of Pain on Daily Activities yes; pain increased to 7 after cognitive evaluation              SLP Evaluation [facility] - 10/24/19 0846      SLP Visit Information   SLP Received On 10/24/19    Referring Provider (SLP) Dr. 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  Per report +LOC after fall, then patient was alert and oriented. He decompensated with EMS and was upgraded to a level 1, intubated en route. Found to have Right subdural hematoma, occipital skull fracture, clivus fracture, L1/2 R tranverse process fx, traumatic brain injury. Underwent  Right frontotemporoparietal craniectomy for evacuation of acute subdural hematoma; insertion of right frontal ventriculostomy; insertion of craniotomy flap into the abdominal subcutaneous tissue. Extubated 7/5.  Pt hospitalized 09/05/19 to 10/05/19 including CIR.     Mobility Status walks independently - PT eval today      Prior Functional Status   Cognitive/Linguistic Baseline Within functional limits    Type of Home Mobile home     Lives With Friend(s)    Vocation Full time employment      Cognition   Overall Cognitive Status Impaired/Different from baseline    Area of Impairment Attention;Memory;Safety/judgement;Awareness;Problem solving    Attention Sustained    Sustained Attention Impaired    Sustained Attention Impairment Verbal basic;Functional basic    Memory Impaired    Memory Impairment  Decreased recall of new information;Decreased short term memory;Storage deficit    Decreased Short Term Memory Verbal basic;Functional basic    Awareness Impaired    Awareness Impairment Intellectual impairment    Problem Solving Impaired    Problem Solving Impairment Verbal basic;Functional basic    Corporate treasurer;Sequencing;Organizing;Decision Making;Initiating;Self Monitoring;Self Correcting    Reasoning Impaired    Sequencing Impaired    Organizing Impaired    Decision Making Impaired    Initiating Impaired    Self Monitoring Impaired    Self Correcting Impaired      Auditory Comprehension   Overall Auditory Comprehension Appears within functional limits for  tasks assessed    Yes/No Questions Within Functional Limits    Commands Within Functional Limits      Visual Recognition/Discrimination   Discrimination Exceptions to [facility]    Other Visual Recogniton/Discrimination Comments diplioplia right      Reading Comprehension   Reading Status Not tested      Verbal Expression   Overall Verbal Expression Impaired    Initiation No impairment    Level of Generative/Spontaneous Verbalization Conversation    Repetition No impairment    Naming Impairment    Responsive Not tested    Confrontation 75-100% accurate    Convergent Not tested    Divergent 50-74% accurate    Pragmatics No impairment    Interfering Components Attention    Effective Techniques Open ended questions      Written Expression   Dominant Hand Right    Written Expression Not tested      Oral Motor/Sensory Function   Overall Oral Motor/Sensory Function Appears within functional limits for tasks assessed      Motor Speech   Overall Motor Speech Appears within functional limits for tasks assessed      Standardized Assessments   Standardized Assessments  Cognitive Linguistic Quick Test      Cognitive Linguistic Quick Test (Ages 18-69)   Attention Mild    Memory WNL    Executive Function Severe    Language WNL    Visuospatial Skills Mild    Severity Rating Total 15    Composite Severity Rating 12.6                           SLP Education - 10/24/19 1008    Education Details areas of impairment, family to A with legal, insurance, medical etc due to cognitive impairments.    Person(s) Educated Patient;Caregiver(s);Other (comment)   brother and boss   Methods Explanation;Verbal cues;Handout    Comprehension Verbal cues required;Need further instruction            SLP Short Term Goals - 10/24/19 1121      SLP SHORT TERM GOAL #1   Title Pt will utilize external aids to recall appointments, schedule, and information/conversations with family with  rare min A over 2 sessions    Time 4    Period Weeks    Status New      SLP SHORT TERM GOAL #2   Title Pt will verbalize 3 cognitive impairments and how they affect his safety with mod I over 2 sessions    Time 4    Period Weeks    Status New      SLP SHORT TERM GOAL #3   Title Pt will carryover 2 strategies to compensate for attention and processing when receiving information from his doctor, boss or WC    Time 4  Period Weeks    Status New      SLP SHORT TERM GOAL #4   Title Pt will carryover strategies to reduced headache frequency and severity to 3/10 or less over 2 sessions    Time 4    Period Weeks    Status New      SLP SHORT TERM GOAL #5   Title Pt will sustain attention to cognitive linguistic task for 20 minutes with rare min A    Time 4    Period Weeks    Status New            SLP Long Term Goals - 10/24/19 1129      SLP LONG TERM GOAL #1   Title Pt will demonstrate emergent awareness by IDing and correcting 80% of  errors on cognitive tasks with rare min A    Time 8    Period Weeks    Status New      SLP LONG TERM GOAL #2   Title Pt will carryover compensatory strategies for attention, executive function to complete IADL's with occasional min A from brother    Time 8    Period Weeks    Status New      SLP LONG TERM GOAL #3   Title Pt will carryover 4 compensatory strategies to attend to and recall information from MD, family, and work with rare min A over 2 sessions    Time [place] - 10/24/19 1010    Clinical Impression Statement Jason Skinner is referred for outpt ST due to cognitive linguistic impairments s/p brain injury at work. He is accompnaied by his brother, Jason Skinner and his boss, Jason Skinner [employer]. Jason Skinner reports that Jason Skinner asks him repeated questions, forgetting what he told him. Jason Skinner denies any changes in his memory or attention, demonstrating poor intellectual awareness of cognitive impairments. Today he  presents with moderate cognitive linguistic impairment. The CLQT revealed severe executive function deficit, with memory WNL. I believe memory is affected by attention and slow processing. As evaluation progressed Jason Skinner's response time increased and his problem solving decreased. He affirmed cognitive fatigue. His headahce increased as evaluation progressed. By the end of the cognitive evaluatoin, Jason Skinner demonstrated loss of balance and required A for ambulation.  Jason Skinner reports sensory hypersensitivity to light and noises. Due to cogntive impairments, Jason Skinner should have family present for all medical, legal, insurance and financial decisions. I recommend skilled ST to maximize cognition for safety,    Speech Therapy Frequency 2x / week    Duration --   8 weeks or 17 visits   Treatment/Interventions Cognitive reorganization;Functional tasks;Compensatory strategies;Compensatory techniques;Cueing hierarchy;Environmental controls;SLP instruction and feedback;Patient/family education;Multimodal communcation approach;Internal/external aids    Potential to Achieve Goals Good           Patient will benefit from skilled therapeutic intervention in order to improve the following deficits and impairments:   Cognitive communication deficit    Problem List Patient Active Problem List   Diagnosis Date Noted  . Superficial venous thrombosis of arm, unspecified laterality   . Vascular headache   . Acute blood loss anemia   . Thrombocytosis (Jason Skinner)   . Urinary retention   . Postoperative pain   . Urinary incontinence   . Dysphagia   . Traumatic subdural hematoma ([place]) 09/20/2019  . SDH (subdural hematoma) (Jason Skinner) 09/05/2019    Jason Skinner, Annye Rusk MS, CCC-SLP 10/24/2019,  11:37 AM  [facility] [address] [place], [place], [address] Phone: [phone]   Fax:  [phone]  Name: Jason Skinner MRN: [id number] Date of Birth: Mar 26, 1990

## 2019-10-24 NOTE — Patient Instructions (Addendum)
  Jason Skinner is having memory difficulty. Someone should be with him when he is at the doctor  Jason Skinner should not sign anything without family being aware of what he is signing or agreeing to  Jason Skinner has had a brain injury and should not return to work as he has visual, physical and cognitive impairments  He will benefit from taking 10-15 minute breaks in a dark quiet space in between jobs/chores  When he is trying to focus, eliminate background noises and bright lights  Sunglasses will help, even in a store     Cognitive Activities you can do at home:   - [place] (easy level)  - [place] saw puzzles

## 2019-10-24 NOTE — Addendum Note (Signed)
Addended by: Georgiann Hahn A on: 10/24/2019 11:40 AM   Modules accepted: Orders

## 2019-10-30 ENCOUNTER — Encounter: Payer: Self-pay | Attending: Physical Medicine and Rehabilitation | Admitting: Physical Medicine and Rehabilitation

## 2019-10-30 ENCOUNTER — Encounter: Payer: Self-pay | Admitting: Physical Medicine and Rehabilitation

## 2019-10-30 ENCOUNTER — Other Ambulatory Visit: Payer: Self-pay

## 2019-10-30 VITALS — BP 113/77 | HR 73 | Temp 98.4°F | Ht 68.0 in | Wt 151.0 lb

## 2019-10-30 DIAGNOSIS — G43809 Other migraine, not intractable, without status migrainosus: Secondary | ICD-10-CM | POA: Insufficient documentation

## 2019-10-30 DIAGNOSIS — S065X9A Traumatic subdural hemorrhage with loss of consciousness of unspecified duration, initial encounter: Secondary | ICD-10-CM | POA: Insufficient documentation

## 2019-10-30 DIAGNOSIS — S065X3D Traumatic subdural hemorrhage with loss of consciousness of 1 hour to 5 hours 59 minutes, subsequent encounter: Secondary | ICD-10-CM | POA: Insufficient documentation

## 2019-10-30 DIAGNOSIS — S065XAA Traumatic subdural hemorrhage with loss of consciousness status unknown, initial encounter: Secondary | ICD-10-CM

## 2019-10-30 MED ORDER — METHOCARBAMOL 500 MG PO TABS: 500.0000 mg | ORAL_TABLET | Freq: Three times a day (TID) | ORAL | 0 refills | Status: DC | PRN

## 2019-10-30 MED ORDER — TOPIRAMATE 25 MG PO TABS: 75.0000 mg | ORAL_TABLET | Freq: Two times a day (BID) | ORAL | 0 refills | Status: DC

## 2019-10-30 NOTE — Progress Notes (Signed)
Subjective:    Patient ID: Jason Skinner, male    DOB: [birth year], 29 y.o.   MRN: [id number]  HPI  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  His chief complaint today is headache in the occipital lobe. It is present all the time. It is 5/10. He takes the Tylenol twice per day. It helps a little. He does have Topamax 75mg  twice per day. This provides minimal relief. He got oxycodone in the hospital which did help. The pain is aggravated by light and noise and relieved by rest and Tylenol.   No other complaints.  Outpatient rehab is going well as per patient. Notes reviewed. He was complaining of headache at outpatient appointments as well. He continues to have impaired executive functioning and unsteadiness,  Crani incision healing well.   Pain Inventory Average Pain 5 Pain Right Now 5 My pain is constant  LOCATION OF PAIN  head  BOWEL Number of stools per week: n/a  Oral laxative use Yes  Type of laxative miralax Enema or suppository use No  History of colostomy No  Incontinent No   BLADDER Normal In and out cath, frequency n/a Able to self cath n/a Bladder incontinence No  Frequent urination No  Leakage with coughing No  Difficulty starting stream No  Incomplete bladder emptying No    Mobility walk without assistance how many minutes can you walk? 10 ability to climb steps?  yes do you drive?  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 No pertinent family history. Social History   Socioeconomic History  . Marital status: Married    Spouse name: Not on file  . Number of children: Not on file  . Years of education: Not on file  . Highest education level: Not on file    Occupational History  . Not on file  Tobacco Use  . Smoking status: Never Smoker  . Smokeless tobacco: Never Used  Vaping Use  . Vaping Use: Never used  Substance and Sexual Activity  . Alcohol use: Not Currently  . Drug use: Not Currently  . Sexual activity: Not Currently  Other Topics Concern  . Not on file  Social History Narrative   Wife and 2 children live in [place].   Social Determinants of Health   Financial Resource Strain:   . Difficulty of Paying Living Expenses: Not on file  Food Insecurity:   . Worried About Charity fundraiser in the Last Year: Not on file  . Ran Out of Food in the Last Year: Not on file  Transportation Needs:   . Lack of Transportation (Medical): Not on file  . Lack of Transportation (Non-Medical): Not on file  Physical Activity:   . Days of Exercise per Week: Not on file  . Minutes of Exercise per Session: Not on file  Stress:   . Feeling of Stress : Not on file  Social Connections:   . Frequency of Communication with Friends and Family: Not on file  . Frequency of Social Gatherings with Friends and Family: Not on file  . Attends Religious Services: Not on file  . Active Member of Clubs or Organizations: Not on file  . Attends Archivist Meetings: Not on file  .  Marital Status: Not on file   Past Surgical History:  Procedure Laterality Date  . CRANIOTOMY Right 09/05/2019   Procedure: CRANIOTOMY HEMATOMA EVACUATION SUBDURAL WITH  PLACEMENT OF BONE FLAP IN ABDOMEN.;  Surgeon: Newman Pies, MD;  Location: [facility];  Service: Neurosurgery;  Laterality: Right;  right   History reviewed. No pertinent past medical history. BP 113/77   Pulse 73   Temp 98.4 F (36.9 C)   Ht 5\' 8"  (1.727 m)   Wt 151 lb (68.5 kg)   SpO2 98%   BMI 22.96 kg/m   Opioid Risk Score:   Fall Risk Score:  `1  Depression screen PHQ 2/9  Depression screen PHQ 2/9 10/11/2019  Decreased Interest 1  Down, Depressed, Hopeless 1  PHQ - 2 Score 2     Review of Systems  Constitutional: Positive for chills and fever.       Temp normal today     HENT: Negative.   Eyes: Negative.   Respiratory: Negative.   Cardiovascular: Negative.   Gastrointestinal: Negative.   Endocrine: Negative.   Genitourinary: Negative.   Musculoskeletal: Negative.   Skin: Negative.   Allergic/Immunologic: Negative.   Neurological: Positive for weakness and headaches.  Hematological: Negative.   Psychiatric/Behavioral: Negative.   All other systems reviewed and are negative.      Objective:   Physical Exam Gen: no distress, normal appearing HEENT: impaired left sided EOM, NCAT Cardio: Reg rate Chest: normal effort, normal rate of breathing Abd: soft, non-distended Ext: no edema Skin: crani incision c/d/i. Hair is growing back well. RUQ surgical incision well healed Neuro: Alert and able to answer questions.  Musculoskeletal: Psych: flat affect.      Assessment & Plan:  1.Decreased functional mobility with altered mental statussecondary to traumatic bilateral SDH/SAH. Status post right frontotemporoparietal craniectomy evacuation of acute subdural hematoma and insertion of right frontal ventriculostomy insertion of craniotomy flap into the abdominal subcutaneous tissue 09/05/2019. Helmet when out of bed.   -He brings helmet to appointment and dons it following examination.   -He has been receiving outpatient SLP and PT (notes reviewed) with noted impairments in executive functioning and unsteadiness of gait.  2. Headache    -He does have relief from Topamax, Robaxin, and Tylenol. I have refilled these medications and advised him that he may increase his frequency of Tylenol use from two per day to four per day if needed for pain.   -He asks about oxycodone for headache and I advised that we can consider this if needed but it would be safer to increase Tylenol frequency since this medication is helping and since he is still below max dose.    -Apply cold pack to occipital region three time per day for 15 minutes per day  3. S/p craniotomy and abdominal flap -Incisions examined and is healing well.   4) Visual field deficit- affecting gait and may also be contributing to headache. Will refer to neuro-ophthalmology.  5) General health -Will refer to PCP.  All questions answered. RTC in 1 month. Advised that we may provide refills until he establishes care with PCP.

## 2019-10-31 ENCOUNTER — Telehealth: Payer: Self-pay

## 2019-10-31 NOTE — Telephone Encounter (Signed)
-----   Message from Estanislado Emms, LCSW sent at 10/18/2019 11:27 AM EDT ----- Aileen Pilot, I was just on the phone with this patient's brother regarding a Medicaid application. He then asked me about a medication refill (I believe for pain meds). I don't think [facility] filled it, I believe it was given to him when he was discharged from the hospital recently. But I wasn't sure if there's anything we can do or where to direct him. Would you be able to call the brother back about this? The patient had a TBI and the social worker at inpatient rehab said he gets confused, so it's best to talk to brother. I've been talking to brother Sid Falcon and he needs a Spanish interpreter. Let me know, thanks!

## 2019-11-15 ENCOUNTER — Telehealth: Payer: Self-pay

## 2019-11-15 ENCOUNTER — Other Ambulatory Visit: Payer: Self-pay | Admitting: Physical Medicine and Rehabilitation

## 2019-11-15 MED ORDER — ONDANSETRON HCL 4 MG PO TABS: 4.0000 mg | ORAL_TABLET | Freq: Every day | ORAL | 1 refills | Status: DC | PRN

## 2019-11-15 MED ORDER — TOPIRAMATE 25 MG PO TABS: 75.0000 mg | ORAL_TABLET | Freq: Two times a day (BID) | ORAL | 0 refills | Status: DC

## 2019-11-15 NOTE — Telephone Encounter (Signed)
Interpreter ID # [id number] Deana: Rx for nausea & pain sent to the pharmacy today. Patient voiced understanding.

## 2019-11-23 ENCOUNTER — Inpatient Hospital Stay (HOSPITAL_COMMUNITY)
Admission: EM | Admit: 2019-11-23 | Discharge: 2019-12-04 | DRG: 026 | Disposition: A | Discharge status: Rehabilitation Facility | Payer: Self-pay | Attending: Neurosurgery | Admitting: Neurosurgery

## 2019-11-23 ENCOUNTER — Emergency Department (HOSPITAL_COMMUNITY): Payer: Self-pay

## 2019-11-23 ENCOUNTER — Inpatient Hospital Stay (HOSPITAL_COMMUNITY): Payer: Self-pay

## 2019-11-23 ENCOUNTER — Other Ambulatory Visit: Payer: Self-pay

## 2019-11-23 ENCOUNTER — Encounter (HOSPITAL_COMMUNITY): Payer: Self-pay | Admitting: Emergency Medicine

## 2019-11-23 DIAGNOSIS — G9389 Other specified disorders of brain: Secondary | ICD-10-CM | POA: Diagnosis present

## 2019-11-23 DIAGNOSIS — S065X9D Traumatic subdural hemorrhage with loss of consciousness of unspecified duration, subsequent encounter: Secondary | ICD-10-CM

## 2019-11-23 DIAGNOSIS — R4182 Altered mental status, unspecified: Secondary | ICD-10-CM

## 2019-11-23 DIAGNOSIS — Z9889 Other specified postprocedural states: Secondary | ICD-10-CM

## 2019-11-23 DIAGNOSIS — R339 Retention of urine, unspecified: Secondary | ICD-10-CM | POA: Diagnosis present

## 2019-11-23 DIAGNOSIS — R471 Dysarthria and anarthria: Secondary | ICD-10-CM | POA: Diagnosis present

## 2019-11-23 DIAGNOSIS — R531 Weakness: Secondary | ICD-10-CM

## 2019-11-23 DIAGNOSIS — S069X1D Unspecified intracranial injury with loss of consciousness of 30 minutes or less, subsequent encounter: Secondary | ICD-10-CM

## 2019-11-23 DIAGNOSIS — W11XXXD Fall on and from ladder, subsequent encounter: Secondary | ICD-10-CM | POA: Diagnosis present

## 2019-11-23 DIAGNOSIS — G9349 Other encephalopathy: Secondary | ICD-10-CM | POA: Diagnosis present

## 2019-11-23 DIAGNOSIS — K5900 Constipation, unspecified: Secondary | ICD-10-CM | POA: Diagnosis present

## 2019-11-23 DIAGNOSIS — Z20822 Contact with and (suspected) exposure to covid-19: Secondary | ICD-10-CM | POA: Diagnosis present

## 2019-11-23 DIAGNOSIS — G9782 Other postprocedural complications and disorders of nervous system: Principal | ICD-10-CM | POA: Diagnosis present

## 2019-11-23 DIAGNOSIS — D72829 Elevated white blood cell count, unspecified: Secondary | ICD-10-CM | POA: Diagnosis present

## 2019-11-23 DIAGNOSIS — Y838 Other surgical procedures as the cause of abnormal reaction of the patient, or of later complication, without mention of misadventure at the time of the procedure: Secondary | ICD-10-CM | POA: Diagnosis present

## 2019-11-23 DIAGNOSIS — G9340 Encephalopathy, unspecified: Secondary | ICD-10-CM

## 2019-11-23 LAB — CBC WITH DIFFERENTIAL/PLATELET
Abs Immature Granulocytes: 0.06 10*3/uL (ref 0.00–0.07)
Basophils Absolute: 0.1 10*3/uL (ref 0.0–0.1)
Basophils Relative: 1 %
Eosinophils Absolute: 0 10*3/uL (ref 0.0–0.5)
Eosinophils Relative: 0 %
HCT: 46 % (ref 39.0–52.0)
Hemoglobin: 15.6 g/dL (ref 13.0–17.0)
Immature Granulocytes: 0 %
Lymphocytes Relative: 15 %
Lymphs Abs: 2.2 10*3/uL (ref 0.7–4.0)
MCH: 30.8 pg (ref 26.0–34.0)
MCHC: 33.9 g/dL (ref 30.0–36.0)
MCV: 90.7 fL (ref 80.0–100.0)
Monocytes Absolute: 1 10*3/uL (ref 0.1–1.0)
Monocytes Relative: 7 %
Neutro Abs: 11.2 10*3/uL — ABNORMAL HIGH (ref 1.7–7.7)
Neutrophils Relative %: 77 %
Platelets: 485 10*3/uL — ABNORMAL HIGH (ref 150–400)
RBC: 5.07 MIL/uL (ref 4.22–5.81)
RDW: 13.8 % (ref 11.5–15.5)
WBC: 14.5 10*3/uL — ABNORMAL HIGH (ref 4.0–10.5)
nRBC: 0 % (ref 0.0–0.2)

## 2019-11-23 LAB — SALICYLATE LEVEL: Salicylate Lvl: <7 mg/dL — ABNORMAL LOW (ref 7.0–30.0)

## 2019-11-23 LAB — COMPREHENSIVE METABOLIC PANEL
ALT: 25 U/L (ref 0–44)
AST: 29 U/L (ref 15–41)
Albumin: 4.6 g/dL (ref 3.5–5.0)
Alkaline Phosphatase: 68 U/L (ref 38–126)
Anion gap: 13 (ref 5–15)
BUN: <5 mg/dL — ABNORMAL LOW (ref 6–20)
CO2: 19 mmol/L — ABNORMAL LOW (ref 22–32)
Calcium: 9.7 mg/dL (ref 8.9–10.3)
Chloride: 109 mmol/L (ref 98–111)
Creatinine, Ser: 0.77 mg/dL (ref 0.61–1.24)
GFR calc Af Amer: >60 mL/min (ref >60)
GFR calc non Af Amer: >60 mL/min (ref >60)
Glucose, Bld: 109 mg/dL — ABNORMAL HIGH (ref 70–99)
Potassium: 3.2 mmol/L — ABNORMAL LOW (ref 3.5–5.1)
Sodium: 141 mmol/L (ref 135–145)
Total Bilirubin: 1.1 mg/dL (ref 0.3–1.2)
Total Protein: 8.2 g/dL — ABNORMAL HIGH (ref 6.5–8.1)

## 2019-11-23 LAB — RAPID URINE DRUG SCREEN, HOSP PERFORMED
Amphetamines: NOT DETECTED
Barbiturates: NOT DETECTED
Benzodiazepines: NOT DETECTED
Cocaine: NOT DETECTED
Opiates: NOT DETECTED
Tetrahydrocannabinol: NOT DETECTED

## 2019-11-23 LAB — SARS CORONAVIRUS 2 BY RT PCR (HOSPITAL ORDER, PERFORMED IN ~~LOC~~ HOSPITAL LAB): SARS Coronavirus 2: NEGATIVE

## 2019-11-23 LAB — URINALYSIS, ROUTINE W REFLEX MICROSCOPIC
Bilirubin Urine: NEGATIVE
Glucose, UA: NEGATIVE mg/dL
Hgb urine dipstick: NEGATIVE
Ketones, ur: NEGATIVE mg/dL
Leukocytes,Ua: NEGATIVE
Nitrite: NEGATIVE
Protein, ur: NEGATIVE mg/dL
Specific Gravity, Urine: 1.012 (ref 1.005–1.030)
pH: 5 (ref 5.0–8.0)

## 2019-11-23 LAB — ETHANOL: Alcohol, Ethyl (B): <10 mg/dL (ref <10)

## 2019-11-23 LAB — MRSA PCR SCREENING: MRSA by PCR: NEGATIVE

## 2019-11-23 LAB — AMMONIA: Ammonia: 34 umol/L (ref 9–35)

## 2019-11-23 LAB — LACTIC ACID, PLASMA: Lactic Acid, Venous: 1.2 mmol/L (ref 0.5–1.9)

## 2019-11-23 MED ORDER — ONDANSETRON HCL 4 MG/2ML IJ SOLN: 4.0000 mg | Freq: Four times a day (QID) | INTRAMUSCULAR | Status: DC | PRN

## 2019-11-23 MED ORDER — OXYCODONE HCL 5 MG PO TABS
5.0000 mg | ORAL_TABLET | ORAL | Status: DC | PRN
  Administered 2019-11-23 – 2019-11-30 (×17): 5 mg via ORAL
  Filled 2019-11-23 (×17): qty 1

## 2019-11-23 MED ORDER — METHOCARBAMOL 500 MG PO TABS
500.0000 mg | ORAL_TABLET | Freq: Three times a day (TID) | ORAL | Status: DC | PRN
  Administered 2019-11-24 – 2019-11-29 (×3): 500 mg via ORAL
  Filled 2019-11-23 (×4): qty 1

## 2019-11-23 MED ORDER — FLEET ENEMA 7-19 GM/118ML RE ENEM: 1.0000 | ENEMA | Freq: Once | RECTAL | Status: DC | PRN

## 2019-11-23 MED ORDER — TOPIRAMATE 25 MG PO TABS
75.0000 mg | ORAL_TABLET | Freq: Two times a day (BID) | ORAL | Status: DC
  Administered 2019-11-23 – 2019-12-04 (×21): 75 mg via ORAL
  Filled 2019-11-23 (×21): qty 3

## 2019-11-23 MED ORDER — POLYETHYLENE GLYCOL 3350 17 G PO PACK
17.0000 g | PACK | Freq: Every day | ORAL | Status: DC
  Administered 2019-11-23 – 2019-12-04 (×10): 17 g via ORAL
  Filled 2019-11-23 (×9): qty 1

## 2019-11-23 MED ORDER — BISACODYL 5 MG PO TBEC: 5.0000 mg | DELAYED_RELEASE_TABLET | Freq: Every day | ORAL | Status: DC | PRN

## 2019-11-23 MED ORDER — ACETAMINOPHEN 500 MG PO TABS
1000.0000 mg | ORAL_TABLET | Freq: Four times a day (QID) | ORAL | Status: DC | PRN
  Administered 2019-11-23 – 2019-11-27 (×4): 1000 mg via ORAL
  Filled 2019-11-23 (×4): qty 2

## 2019-11-23 MED ORDER — SODIUM CHLORIDE 0.9 % IV SOLN: INTRAVENOUS | Status: DC

## 2019-11-23 MED ORDER — FENTANYL CITRATE (PF) 100 MCG/2ML IJ SOLN
12.5000 ug | INTRAMUSCULAR | Status: DC | PRN
  Administered 2019-11-27: 12.5 ug via INTRAVENOUS
  Administered 2019-11-28: 50 ug via INTRAVENOUS
  Administered 2019-11-28: 12.5 ug via INTRAVENOUS
  Administered 2019-11-29 – 2019-11-30 (×6): 50 ug via INTRAVENOUS
  Filled 2019-11-23 (×9): qty 2

## 2019-11-23 MED ORDER — BETHANECHOL CHLORIDE 10 MG PO TABS
10.0000 mg | ORAL_TABLET | Freq: Three times a day (TID) | ORAL | Status: DC
  Administered 2019-11-23 – 2019-12-04 (×31): 10 mg via ORAL
  Filled 2019-11-23 (×33): qty 1

## 2019-11-23 MED ORDER — MECLIZINE HCL 12.5 MG PO TABS
12.5000 mg | ORAL_TABLET | Freq: Three times a day (TID) | ORAL | Status: DC | PRN
  Filled 2019-11-23: qty 1

## 2019-11-23 MED ORDER — ONDANSETRON HCL 4 MG PO TABS: 4.0000 mg | ORAL_TABLET | Freq: Four times a day (QID) | ORAL | Status: DC | PRN

## 2019-11-23 NOTE — ED Triage Notes (Signed)
Arrived via EMS from home. Doctor assessed patient in triage. 2 months ago head trauma and and part of skull removed. Family reported patient increased weakness for 1 week increase more past 24 hours with 1 word answers.

## 2019-11-23 NOTE — ED Triage Notes (Signed)
Emergency Medicine Provider Triage Evaluation Note  Jason Skinner , a 29 y.o. male  was evaluated in triage.  Pt complains of weakness and altered mental status.  Patient has a significant history of traumatic subdural hemorrhage status post craniotomy 2 months ago with chronic disability since the accident.  Patient has chronic headaches and some difficulty with ambulating that has required family assistance but patient is able to stand and walk with assistance and is able to carry on conversations.  Family reports that he was placed on Topamax 2 weeks ago but has had no other medication changes.  In the last 1 week they have noticed he started to have some change in his mental status and more difficulty walking.  In the last 24 hours he is now only speaking 1-2 words and he cannot get up and walk on his own.  He complains of a headache but reports that is more chronic.  EMS reports he was unable to walk down the stairs and needed significant assistance just to get out of bed.  They did not find any localizing symptoms.  He has not had vomiting, abdominal pain, chest pain, shortness of breath.  He denies any new head trauma.  Review of Systems  Positive: Headache, generalized weakness Negative: Otherwise 12 point ROS systems are neg  Physical Exam  BP 119/88 (BP Location: Right Arm)   Pulse 94   Temp 98.8 F (37.1 C) (Oral)   Resp 16   SpO2 99%  Gen:   Awake, no distress slightly sluggish but able to answer questions in spanish HEENT:  Large defect in the right side of skull from prior craniotomy with well healed scar Resp:  Normal effort, no crackles or wheezing Cardiac:  Normal rate, no murmurs Abd:   Nondistended, nontender  MSK:   Moves extremities without difficulty, no swelling or signs of trauma Neuro:  Speech is appropriate to questions.  No Aphasia.  No notable pronator drift for upper or lower ext.  5/5 strength in all ext.  PERRLA and EOMI.  Medical Decision Making   Medically screening exam initiated at 10:39 AM.  Appropriate orders placed.  Jason Skinner was informed that the remainder of the evaluation will be completed by another provider, this initial triage assessment does not replace that evaluation, and the importance of remaining in the ED until their evaluation is complete.  Clinical Impression  29 year old male with recent traumatic brain injury status post craniotomy who is been living at home and followed up with neurology 2 weeks ago with ongoing disability from the accident.  Now being brought in by EMS for recent change in mental status and decline at home.  Patient is taking Topamax and is complaining of a headache but denies any new trauma.  Concern for repeat bleed causing his symptoms of generalized weakness versus infection versus new SIADH or other electrolyte abnormality.  Labs and imaging are pending.  Patient appears stable at this time.   Jason Dessert, MD 11/23/19 1045

## 2019-11-23 NOTE — Consult Note (Signed)
Date: 11/23/2019               Patient Name:  Jason Skinner MRN: [id number]  DOB: October 10, 1990 Age / Sex: 29 y.o., male   PCP: Patient, No Pcp Per         Requesting Physician: Dr. Newman Pies, MD    Consulting Reason:  Leukocytosis     Chief Complaint: Altered Mental Status  History of Present Illness:  29 year old Hispanic male with past history of traumatic sudural hemorrhage status post craniotomy 2 months ago who presents today for altered mental status and weakness. Patient states that for the past 4 days he has been feeling weakness in the his legs and worsening headache with associated dizziness. Yesterday he states he fell while putting on his shoes due to the dizziness and headache. States he landed on the couch and denies any injury, pain, or LOC from the fall. Patient's friend was at bedside and states that the patient had previously been able to walk and talk but since 4 days ago he has needed assistance with walking, has not been eating, and talking less. He also notes that patient was recently started on a new medication for headaches but cannot provide more details. History is limited as patient is unable to provide detailed history and only speaks in short 1-2 word phrases. He denies fever, chills, chest pain, dyspnea, nausea, vomiting, numbness, tingling, bowel or bladder incontinence. He denies tobacco, alcohol, or recreational drug use.  From previous records. The patient feel off a a ladder approximately 28ft height while at work on 09/05/2019.  Initial head CT showed R>L cerebral convexity subdural hematoma with leftward midline shift of 2cm and R uncal herniation. Pt underwent right frontal temporal parietal craniectomy for evacuation of acute subdural hematoma and insertion of right frontal ventriculostomy insertion of craniotomy flap into the abdominal subcutaneous tissue 09/05/2019 by Dr. Arnoldo Morale.     Meds: Current Facility-Administered Medications  Medication Dose  Route Frequency Provider Last Rate Last Admin   0.9 %  sodium chloride infusion   Intravenous Continuous Viona Gilmore D, NP 100 mL/hr at 11/23/19 1630 New Bag at 11/23/19 1630   acetaminophen (TYLENOL) tablet 1,000 mg  1,000 mg Oral Q6H PRN Viona Gilmore D, NP   1,000 mg at 11/23/19 1721   bethanechol (URECHOLINE) tablet 10 mg  10 mg Oral TID Viona Gilmore D, NP   10 mg at 11/23/19 1721   bisacodyl (DULCOLAX) EC tablet 5 mg  5 mg Oral Daily PRN Viona Gilmore D, NP       fentaNYL (SUBLIMAZE) injection 12.5-50 mcg  12.5-50 mcg Intravenous Q2H PRN Bergman, Meghan D, NP       meclizine (ANTIVERT) tablet 12.5 mg  12.5 mg Oral TID PRN Viona Gilmore D, NP       methocarbamol (ROBAXIN) tablet 500 mg  500 mg Oral Q8H PRN Bergman, Meghan D, NP       ondansetron (ZOFRAN) tablet 4 mg  4 mg Oral Q6H PRN Viona Gilmore D, NP       Or   ondansetron (ZOFRAN) injection 4 mg  4 mg Intravenous Q6H PRN Bergman, Meghan D, NP       oxyCODONE (Oxy IR/ROXICODONE) immediate release tablet 5 mg  5 mg Oral Q4H PRN Viona Gilmore D, NP   5 mg at 11/23/19 1721   polyethylene glycol (MIRALAX / GLYCOLAX) packet 17 g  17 g Oral Daily Viona Gilmore D, NP   17 g at 11/23/19  1627   sodium phosphate (FLEET) 7-19 GM/118ML enema 1 enema  1 enema Rectal Once PRN Bergman, Meghan D, NP       topiramate (TOPAMAX) tablet 75 mg  75 mg Oral BID Viona Gilmore D, NP   75 mg at 11/23/19 1627    Allergies: Allergies as of 11/23/2019   (No Known Allergies)   History reviewed. No pertinent past medical history. Past Surgical History:  Procedure Laterality Date   CRANIOTOMY Right 09/05/2019   Procedure: CRANIOTOMY HEMATOMA EVACUATION SUBDURAL WITH  PLACEMENT OF BONE FLAP IN ABDOMEN.;  Surgeon: Newman Pies, MD;  Location: [facility];  Service: Neurosurgery;  Laterality: Right;  right   No family history on file. Social History   Socioeconomic History   Marital status: Married    Spouse name: Not on file     Number of children: Not on file   Years of education: Not on file   Highest education level: Not on file  Occupational History   Not on file  Tobacco Use   Smoking status: Never Smoker   Smokeless tobacco: Never Used  Vaping Use   Vaping Use: Never used  Substance and Sexual Activity   Alcohol use: Not Currently   Drug use: Not Currently   Sexual activity: Not Currently  Other Topics Concern   Not on file  Social History Narrative   Wife and 2 children live in [place].   Social Determinants of Health   Financial Resource Strain:    Difficulty of Paying Living Expenses: Not on file  Food Insecurity:    Worried About Charity fundraiser in the Last Year: Not on file   [employer] of Food in the Last Year: Not on file  Transportation Needs:    Lack of Transportation (Medical): Not on file   Lack of Transportation (Non-Medical): Not on file  Physical Activity:    Days of Exercise per Week: Not on file   Minutes of Exercise per Session: Not on file  Stress:    Feeling of Stress : Not on file  Social Connections:    Frequency of Communication with Friends and Family: Not on file   Frequency of Social Gatherings with Friends and Family: Not on file   Attends Religious Services: Not on file   Active Member of Clubs or Organizations: Not on file   Attends Archivist Meetings: Not on file   Marital Status: Not on file  Intimate Partner Violence:    Fear of Current or Ex-Partner: Not on file   Emotionally Abused: Not on file   Physically Abused: Not on file   Sexually Abused: Not on file    Review of Systems: Pertinent items are noted in HPI.  Physical Exam: Blood pressure 120/87, pulse 79, temperature 98.6 F (37 C), temperature source Oral, resp. rate 12, height 5\' 7"  (1.702 m), weight 65.6 kg, SpO2 98 %.  Physical Exam Constitutional:      Comments: Lethargic, lays with head to the left  HENT:     Head:     Comments: Right  craniotomy changes    Right Ear: External ear normal.     Left Ear: External ear normal.     Nose: Nose normal.     Mouth/Throat:     Mouth: Mucous membranes are moist.     Pharynx: Oropharynx is clear.  Eyes:     Extraocular Movements: Extraocular movements intact.     Pupils: Pupils are equal, round, and reactive to  light.  Neck:     Comments: No tenderness to palpation, but pain with rotating to the right Cardiovascular:     Rate and Rhythm: Normal rate and regular rhythm.     Pulses: Normal pulses.     Heart sounds: Normal heart sounds.  Pulmonary:     Effort: Pulmonary effort is normal.     Breath sounds: Normal breath sounds.  Abdominal:     General: Bowel sounds are normal.     Comments: Palpable bone flap in RUQ  Musculoskeletal:     Right lower leg: No edema.     Left lower leg: No edema.  Skin:    Capillary Refill: Capillary refill takes less than 2 seconds.  Neurological:     Mental Status: He is alert.     Comments: Oriented to place and time, lethargic slow to respond to questions, only able to speak in short sentences, 4/5 strength of LLE 5/5 strength RLE, normal sensation to face and legs, 5/5 bilateral grip strength    Lab results: CBC Latest Ref Rng & Units 11/23/2019 10/02/2019 09/24/2019  WBC 4.0 - 10.5 K/uL 14.5(H) 7.4 10.4  Hemoglobin 13.0 - 17.0 g/dL 15.6 13.9 12.9(L)  Hematocrit 39 - 52 % 46.0 41.9 39.4  Platelets 150 - 400 K/uL 485(H) 376 696(H)   CMP Latest Ref Rng & Units 11/23/2019 10/01/2019 09/21/2019  Glucose 70 - 99 mg/dL 109(H) 94 105(H)  BUN 6 - 20 mg/dL <5(L) 13 16  Creatinine 0.61 - 1.24 mg/dL 0.77 0.79 0.74  Sodium 135 - 145 mmol/L 141 139 141  Potassium 3.5 - 5.1 mmol/L 3.2(L) 3.6 4.2  Chloride 98 - 111 mmol/L 109 104 101  CO2 22 - 32 mmol/L 19(L) 26 29  Calcium 8.9 - 10.3 mg/dL 9.7 9.8 10.0  Total Protein 6.5 - 8.1 g/dL 8.2(H) - 7.8  Total Bilirubin 0.3 - 1.2 mg/dL 1.1 - 0.9  Alkaline Phos 38 - 126 U/L 68 - 71  AST 15 - 41 U/L 29 - 33   ALT 0 - 44 U/L 25 - 66(H)     Imaging results:  CT HEAD WO CONTRAST   1. Interval increase in mass effect. Despite the large craniectomy defect on the right, the right cerebrum, principally the right frontal lobe, is flattened/compressed with significant midline shift to the left of 1.5 cm. Mild enlargement of the left lateral ventricle suggest partial entrapment. No transtentorial herniation. 2. No new/acute intracranial hemorrhage and no evidence of an ischemic infarct.  DG Chest Port 1 View   Heart size within normal limits. There is no appreciable airspace consolidation. No evidence of pleural effusion or pneumothorax. No acute bony abnormality identified. IMPRESSION: No evidence of active cardiopulmonary disease. Electronically Signed   By: Kellie Simmering DO   On: 11/23/2019 14:41   EKG: normal sinus rhythm, HR 86, rightward axis    Assessment, Plan, & Recommendations by Problem: Active Problems:   Status post craniectomy  29 year old Hispanic male with past history of traumatic sudural hemorrhage status post craniotomy 2 months ago who presents today for altered mental status and weakness found to have interval increase in mass effect with left midline shift admitted by neurosurgery   #Altered mental status #Weakness #Leukocytosis Patient presenting for 4 days of worsening headache with leg weakness particularly in the left leg and worsening mental status. Patient previously able to ambulate and speak without difficult but increasingly is requiring more assistance. He was newly started on topiramate 75 mg twice  daily on 11/15/2019. Patient has been afebrile, normotensive. On exam patient with difficulty speaking, unable to answer complicated questions, neck pain with movement, and weakness in LLE. On admission patient with WBC of 14.5, K 3.2, bicarb of 19. Lactic acid of 1.2, ammonia 34, ethanol and urine drug screen negative. Ct head with interval increase in mass effect.  Right  cerebrum compression with significant midline shiftto the left of 1.5 cm. Mild enlargement of the left lateral ventricle suggest partial entrapment. Neurosurgery admitting the patient due to mass effect on CT with plan for replacement of bone flap and internal medicine consulted for leukocytosis. Internal medicine team consulted for mild leukocytosis. - CX shows no findings concerning for CAP/aspiration - UA shows no evidence of infection, Culture pending  - Mild leukocytosis on blood count, blood cultures pending - Patient does have some neck soreness, but unable to perform LP due to acute worsening of brain herniation/mass effect.  - Follow up Salicylate level - repeat CMP and CBC  Signed: Iona Beard, MD 11/23/2019, 6:41 PM

## 2019-11-23 NOTE — H&P (Signed)
Jason Skinner is a 29 y.o. male.   Chief Complaint: Altered mental status HPI: Patient is a 29 year old Spanish-speaking male who was admitted 09/05/2019 following a fall from a 20' ladder. He became unresponsive in the emergency department. Patient underwent an emergent craniectomy for evacuation of a large right subdural hematoma by Dr. Arnoldo Morale. His bone flap was placed in his abdomen. He went to [facility] inpatient rehab and was discharged home on 10/05/2019. His nephew is at the bedside. There is a Optometrist at the bedside as well. Patient's nephew relates that since Monday, patient has been declining. He was previously ambulatory and able to communicate without difficulty. He presently is unable to ambulate due to significant left-sided weakness and only able to say 1-2 words at a time. Patient's nephew also reports patient has complained of increased headache over the past several days. He was recently prescribed ibuprofen 800 mg, acetaminophen 1000mg , tramadol 50 mg, and ondansetron from an internal medicine provide at [facility].  History reviewed. No pertinent past medical history.  Past Surgical History:  Procedure Laterality Date  . CRANIOTOMY Right 09/05/2019   Procedure: CRANIOTOMY HEMATOMA EVACUATION SUBDURAL WITH  PLACEMENT OF BONE FLAP IN ABDOMEN.;  Surgeon: Newman Pies, MD;  Location: [facility];  Service: Neurosurgery;  Laterality: Right;  right    No family history on file. Social History:  reports that he has never smoked. He has never used smokeless tobacco. He reports previous alcohol use. He reports previous drug use.  Allergies: No Known Allergies  (Not in a hospital admission)   Results for orders placed or performed during the hospital encounter of 11/23/19 (from the past 48 hour(s))  CBC with Differential/Platelet     Status: Abnormal   Collection Time: 11/23/19 10:42 AM  Result Value Ref Range   WBC 14.5 (H) 4.0 - 10.5 K/uL   RBC 5.07 4.22 -  5.81 MIL/uL   Hemoglobin 15.6 13.0 - 17.0 g/dL   HCT 46.0 39 - 52 %   MCV 90.7 80.0 - 100.0 fL   MCH 30.8 26.0 - 34.0 pg   MCHC 33.9 30.0 - 36.0 g/dL   RDW 13.8 11.5 - 15.5 %   Platelets 485 (H) 150 - 400 K/uL   nRBC 0.0 0.0 - 0.2 %   Neutrophils Relative % 77 %   Neutro Abs 11.2 (H) 1.7 - 7.7 K/uL   Lymphocytes Relative 15 %   Lymphs Abs 2.2 0.7 - 4.0 K/uL   Monocytes Relative 7 %   Monocytes Absolute 1.0 0 - 1 K/uL   Eosinophils Relative 0 %   Eosinophils Absolute 0.0 0 - 0 K/uL   Basophils Relative 1 %   Basophils Absolute 0.1 0 - 0 K/uL   Immature Granulocytes 0 %   Abs Immature Granulocytes 0.06 0.00 - 0.07 K/uL    Comment: Performed at [facility], [address] [place], [place] [address]  Comprehensive metabolic panel     Status: Abnormal   Collection Time: 11/23/19 10:42 AM  Result Value Ref Range   Sodium 141 135 - 145 mmol/L   Potassium 3.2 (L) 3.5 - 5.1 mmol/L   Chloride 109 98 - 111 mmol/L   CO2 19 (L) 22 - 32 mmol/L   Glucose, Bld 109 (H) 70 - 99 mg/dL    Comment: Glucose reference range applies only to samples taken after fasting for at least 8 hours.   BUN <5 (L) 6 - 20 mg/dL  Creatinine, Ser 0.77 0.61 - 1.24 mg/dL   Calcium 9.7 8.9 - 10.3 mg/dL   Total Protein 8.2 (H) 6.5 - 8.1 g/dL   Albumin 4.6 3.5 - 5.0 g/dL   AST 29 15 - 41 U/L   ALT 25 0 - 44 U/L   Alkaline Phosphatase 68 38 - 126 U/L   Total Bilirubin 1.1 0.3 - 1.2 mg/dL   GFR calc non Af Amer >60 >60 mL/min   GFR calc Af Amer >60 >60 mL/min   Anion gap 13 5 - 15    Comment: Performed at [facility] [address] [place], [place] [address]  Ethanol     Status: None   Collection Time: 11/23/19 10:42 AM  Result Value Ref Range   Alcohol, Ethyl (B) <10 <10 mg/dL    Comment: (NOTE) Lowest detectable limit for serum alcohol is 10 mg/dL.  For medical purposes only. Performed at [facility], [place] [address], [place], [place] [address]   Ammonia     Status: None    Collection Time: 11/23/19 10:42 AM  Result Value Ref Range   Ammonia 34 9 - 35 umol/L    Comment: Performed at [facility], [place] [address] [place], [place] [address]  Rapid urine drug screen (hospital performed)     Status: None   Collection Time: 11/23/19 11:50 AM  Result Value Ref Range   Opiates NONE DETECTED NONE DETECTED   Cocaine NONE DETECTED NONE DETECTED   Benzodiazepines NONE DETECTED NONE DETECTED   Amphetamines NONE DETECTED NONE DETECTED   Tetrahydrocannabinol NONE DETECTED NONE DETECTED   Barbiturates NONE DETECTED NONE DETECTED    Comment: (NOTE) DRUG SCREEN FOR MEDICAL PURPOSES ONLY.  IF CONFIRMATION IS NEEDED FOR ANY PURPOSE, NOTIFY LAB WITHIN 5 DAYS.  LOWEST DETECTABLE LIMITS FOR URINE DRUG SCREEN Drug Class                     Cutoff (ng/mL) Amphetamine and metabolites    1000 Barbiturate and metabolites    200 Benzodiazepine                 629 Tricyclics and metabolites     300 Opiates and metabolites        300 Cocaine and metabolites        300 THC                            50 Performed at [facility], [place] [address], [place], [place] [address]   Urinalysis, Routine w reflex microscopic Urine, Clean Catch     Status: None   Collection Time: 11/23/19 11:55 AM  Result Value Ref Range   Color, Urine YELLOW YELLOW   APPearance CLEAR CLEAR   Specific Gravity, Urine 1.012 1.005 - 1.030   pH 5.0 5.0 - 8.0   Glucose, UA NEGATIVE NEGATIVE mg/dL   Hgb urine dipstick NEGATIVE NEGATIVE   Bilirubin Urine NEGATIVE NEGATIVE   Ketones, ur NEGATIVE NEGATIVE mg/dL   Protein, ur NEGATIVE NEGATIVE mg/dL   Nitrite NEGATIVE NEGATIVE   Leukocytes,Ua NEGATIVE NEGATIVE    Comment: Performed at [facility] [address] [place], [place] [address]   CT HEAD WO CONTRAST  Result Date: 11/23/2019 CLINICAL DATA:  Mental status change of unknown cause. EXAM: CT HEAD WITHOUT CONTRAST TECHNIQUE: Contiguous axial images were obtained from the base of  the skull through the  vertex without intravenous contrast. COMPARISON:  09/06/2019 FINDINGS: Brain: Large right sided craniectomy unchanged from the most recent prior CT. The subdural drain has been removed. There is significant mass effect with flattening of the right frontal lobe and midline shift to the left of 1.5 cm. The mass effect and midline shift have significantly increased from the prior study. No evidence of an ischemic infarct. No new/acute intracranial hemorrhage. Mild enlargement of the left lateral ventricle compared to the prior CT, suggesting partial entrapment. Vascular: Unremarkable. Skull: No lesion.  Stable from the prior CT. Sinuses/Orbits: Globes and orbits are unremarkable. Visualized sinuses and mastoid air cells are clear. Other: Right-sided soft tissue air, hemorrhage and edema noted on the prior CT has resolved. IMPRESSION: 1. Interval increase in mass effect. Despite the large craniectomy defect on the right, the right cerebrum, principally the right frontal lobe, is flattened/compressed with significant midline shift to the left of 1.5 cm. Mild enlargement of the left lateral ventricle suggest partial entrapment. No transtentorial herniation. 2. No new/acute intracranial hemorrhage and no evidence of an ischemic infarct. Electronically Signed   By: Lajean Manes M.D.   On: 11/23/2019 11:20    Review of Systems  Unable to perform ROS: Mental status change    Blood pressure 107/74, pulse 83, temperature 98.8 F (37.1 C), temperature source Oral, resp. rate 13, weight 63.5 kg, SpO2 98 %. Physical Exam Vitals reviewed.  Constitutional:      Appearance: He is normal weight. He is ill-appearing.  HENT:     Head:      Nose: Nose normal.     Mouth/Throat:     Mouth: Mucous membranes are moist.     Pharynx: Oropharynx is clear.  Eyes:     Extraocular Movements: Extraocular movements intact.     Conjunctiva/sclera: Conjunctivae normal.     Pupils: Pupils are equal, round,  and reactive to light.  Cardiovascular:     Rate and Rhythm: Normal rate and regular rhythm.     Pulses: Normal pulses.  Pulmonary:     Effort: Pulmonary effort is normal. No respiratory distress.  Abdominal:     Comments: Bone flap palpable in RUQ  Musculoskeletal:     Cervical back: Normal range of motion and neck supple.  Skin:    General: Skin is warm and dry.     Capillary Refill: Capillary refill takes less than 2 seconds.  Neurological:     Mental Status: He is lethargic.     GCS: GCS eye subscore is 3. GCS verbal subscore is 4. GCS motor subscore is 6.     Cranial Nerves: Cranial nerves are intact.     Sensory: Sensation is intact.     Motor: Weakness present.  Psychiatric:        Speech: Speech is delayed.      Assessment/Plan Patient with increased mass effect noted on CT scan today. No new intracranial hemhorrage noted. Mass effect increase likely related to craniectomy defect. Will admit patient for monitoring and plan for replacement of bone flap.  Patricia Nettle, NP 11/23/2019, 1:26 PM

## 2019-11-23 NOTE — ED Provider Notes (Addendum)
[facility] EMERGENCY DEPARTMENT Provider Note   CSN: [id number] Arrival date & time: 11/23/19  1027     History Chief Complaint  Patient presents with  . Altered Mental Status  . Weakness    Fortino Haag Lyn Records is a 29 y.o. male.  The history is provided by medical records and the EMS personnel. No language interpreter was used.  Altered Mental Status Associated symptoms: weakness   Weakness    29 year old Hispanic male with significant history of traumatic subdural hemorrhage status post craniotomy 2 months ago with chronic disability since the injury who presents to the ED today with complaints of weakness and altered mental status.  Patient report he has had recurrent headache and difficulty walking symptoms requiring assistance since injury.  Over the past week patient noticed increased trouble walking and having difficulty thinking.  Since yesterday he is having trouble communication and walking.  EMS report patient was unable to ambulate requiring significant assistance to get up.  No report of any nausea or vomiting no chest pain abdominal pain and no trouble breathing.  No new injuries.  Patient was started on Topamax 2 weeks ago.  Patient now only able to speak 1-2 words and cannot assist himself.  Hx limited due to his underlying injury.  Per prior notes: Pt initially fell off a ladder approximately 22ft height while at work on 09/05/2019.  Initial head CT showed R>L cerebral convexity subdural hematoma with leftward midline shift of 2cm and R uncal herniation. Pt underwent right frontal temporal parietal craniectomy for evacuation of acute subdural hematoma and insertion of right frontal ventriculostomy insertion of craniotomy flap into the abdominal subcutaneous tissue 09/05/2019 by Dr. Arnoldo Morale.     History reviewed. No pertinent past medical history.  Patient Active Problem List   Diagnosis Date Noted  . Superficial venous thrombosis of arm, unspecified  laterality   . Vascular headache   . Acute blood loss anemia   . Thrombocytosis ([facility])   . Urinary retention   . Postoperative pain   . Urinary incontinence   . Dysphagia   . Traumatic subdural hematoma ([facility]) 09/20/2019  . SDH (subdural hematoma) ([place]) 09/05/2019    Past Surgical History:  Procedure Laterality Date  . CRANIOTOMY Right 09/05/2019   Procedure: CRANIOTOMY HEMATOMA EVACUATION SUBDURAL WITH  PLACEMENT OF BONE FLAP IN ABDOMEN.;  Surgeon: Newman Pies, MD;  Location: [facility];  Service: Neurosurgery;  Laterality: Right;  right       No family history on file.  Social History   Tobacco Use  . Smoking status: Never Smoker  . Smokeless tobacco: Never Used  Vaping Use  . Vaping Use: Never used  Substance Use Topics  . Alcohol use: Not Currently  . Drug use: Not Currently    Home Medications Prior to Admission medications   Medication Sig Start Date End Date Taking? Authorizing Provider  acetaminophen (TYLENOL) 500 MG tablet Take 2 tablets (1,000 mg total) by mouth every 6 (six) hours as needed for headache. 10/18/19   Dorena Dew, FNP  bethanechol (URECHOLINE) 10 MG tablet Take 1 tablet (10 mg total) by mouth 3 (three) times daily. 10/04/19   Angiulli, Lavon Paganini, PA-C  lidocaine (LIDODERM) 5 % Place 1 patch onto the skin daily. Remove & Discard patch within 12 hours or as directed by MD 10/04/19   Angiulli, Lavon Paganini, PA-C  meclizine (ANTIVERT) 12.5 MG tablet Take 1 tablet (12.5 mg total) by mouth 3 (three) times daily as needed for  dizziness. 10/04/19   Angiulli, Lavon Paganini, PA-C  methocarbamol (ROBAXIN) 500 MG tablet Take 1 tablet (500 mg total) by mouth every 8 (eight) hours as needed for muscle spasms. 10/30/19   Raulkar, Clide Deutscher, MD  ondansetron (ZOFRAN) 4 MG tablet Take 1 tablet (4 mg total) by mouth daily as needed for nausea or vomiting. 11/15/19 11/14/20  Raulkar, Clide Deutscher, MD  polyethylene glycol (MIRALAX / GLYCOLAX) 17 g packet Take 17 g by mouth daily. 10/04/19    Angiulli, Lavon Paganini, PA-C  topiramate (TOPAMAX) 25 MG tablet Take 3 tablets (75 mg total) by mouth 2 (two) times daily. 11/15/19   Raulkar, Clide Deutscher, MD    Allergies    Patient has no known allergies.  Review of Systems   Review of Systems  Unable to perform ROS: Mental status change  Neurological: Positive for weakness.    Physical Exam Updated Vital Signs BP 119/88 (BP Location: Right Arm)   Pulse 94   Temp 98.8 F (37.1 C) (Oral)   Resp 16   Wt 63.5 kg   SpO2 98%   BMI 21.29 kg/m   Physical Exam Vitals and nursing note reviewed.  Constitutional:      Appearance: He is well-developed.  HENT:     Head:     Comments: Large surgical defect to right frontoparietal occipital scalp without any obvious signs of infection or any significant tenderness to palpation. Eyes:     Conjunctiva/sclera: Conjunctivae normal.     Pupils: Pupils are equal, round, and reactive to light.  Cardiovascular:     Rate and Rhythm: Normal rate and regular rhythm.     Pulses: Normal pulses.     Heart sounds: Normal heart sounds.  Pulmonary:     Effort: Pulmonary effort is normal.     Breath sounds: Normal breath sounds.  Abdominal:     Palpations: Abdomen is soft.     Tenderness: There is no abdominal tenderness.  Musculoskeletal:     Cervical back: Neck supple.  Skin:    Findings: No rash.  Neurological:     Mental Status: He is alert.     GCS: GCS eye subscore is 4. GCS verbal subscore is 2. GCS motor subscore is 6.     Cranial Nerves: Dysarthria present. No facial asymmetry.     Motor: Weakness present.     Comments: No pronator drift     ED Results / Procedures / Treatments   Labs (all labs ordered are listed, but only abnormal results are displayed) Labs Reviewed  CBC WITH DIFFERENTIAL/PLATELET - Abnormal; Notable for the following components:      Result Value   WBC 14.5 (*)    Platelets 485 (*)    Neutro Abs 11.2 (*)    All other components within normal limits    COMPREHENSIVE METABOLIC PANEL - Abnormal; Notable for the following components:   Potassium 3.2 (*)    CO2 19 (*)    Glucose, Bld 109 (*)    BUN <5 (*)    Total Protein 8.2 (*)    All other components within normal limits  SARS CORONAVIRUS 2 BY RT PCR (HOSPITAL ORDER, [facility])  ETHANOL  RAPID URINE DRUG SCREEN, HOSP PERFORMED  AMMONIA  URINALYSIS, ROUTINE W REFLEX MICROSCOPIC  LACTIC ACID, PLASMA  LACTIC ACID, PLASMA    EKG EKG Interpretation  Date/Time:  Friday November 23 2019 10:29:04 EDT Ventricular Rate:  86 PR Interval:  142 QRS Duration: 92  QT Interval:  360 QTC Calculation: 430 R Axis:   90 Text Interpretation: Normal sinus rhythm with sinus arrhythmia Rightward axis No significant change since last tracing Confirmed by Blanchie Dessert [phone]) on 11/23/2019 10:44:18 AM   Radiology CT HEAD WO CONTRAST  Result Date: 11/23/2019 CLINICAL DATA:  Mental status change of unknown cause. EXAM: CT HEAD WITHOUT CONTRAST TECHNIQUE: Contiguous axial images were obtained from the base of the skull through the vertex without intravenous contrast. COMPARISON:  09/06/2019 FINDINGS: Brain: Large right sided craniectomy unchanged from the most recent prior CT. The subdural drain has been removed. There is significant mass effect with flattening of the right frontal lobe and midline shift to the left of 1.5 cm. The mass effect and midline shift have significantly increased from the prior study. No evidence of an ischemic infarct. No new/acute intracranial hemorrhage. Mild enlargement of the left lateral ventricle compared to the prior CT, suggesting partial entrapment. Vascular: Unremarkable. Skull: No lesion.  Stable from the prior CT. Sinuses/Orbits: Globes and orbits are unremarkable. Visualized sinuses and mastoid air cells are clear. Other: Right-sided soft tissue air, hemorrhage and edema noted on the prior CT has resolved. IMPRESSION: 1. Interval increase  in mass effect. Despite the large craniectomy defect on the right, the right cerebrum, principally the right frontal lobe, is flattened/compressed with significant midline shift to the left of 1.5 cm. Mild enlargement of the left lateral ventricle suggest partial entrapment. No transtentorial herniation. 2. No new/acute intracranial hemorrhage and no evidence of an ischemic infarct. Electronically Signed   By: Lajean Manes M.D.   On: 11/23/2019 11:20    Procedures Procedures (including critical care time)  Medications Ordered in ED Medications  acetaminophen (TYLENOL) tablet 1,000 mg (has no administration in time range)  meclizine (ANTIVERT) tablet 12.5 mg (has no administration in time range)  polyethylene glycol (MIRALAX / GLYCOLAX) packet 17 g (has no administration in time range)  bethanechol (URECHOLINE) tablet 10 mg (has no administration in time range)  methocarbamol (ROBAXIN) tablet 500 mg (has no administration in time range)  topiramate (TOPAMAX) tablet 75 mg (has no administration in time range)  oxyCODONE (Oxy IR/ROXICODONE) immediate release tablet 5 mg (has no administration in time range)  fentaNYL (SUBLIMAZE) injection 12.5-50 mcg (has no administration in time range)  ondansetron (ZOFRAN) tablet 4 mg (has no administration in time range)    Or  ondansetron (ZOFRAN) injection 4 mg (has no administration in time range)  sodium phosphate (FLEET) 7-19 GM/118ML enema 1 enema (has no administration in time range)  bisacodyl (DULCOLAX) EC tablet 5 mg (has no administration in time range)  0.9 %  sodium chloride infusion (has no administration in time range)    ED Course  I have reviewed the triage vital signs and the nursing notes.  Pertinent labs & imaging results that were available during my care of the patient were reviewed by me and considered in my medical decision making (see chart for details).    MDM Rules/Calculators/A&P                          BP 112/85    Pulse 94   Temp 98.8 F (37.1 C) (Oral)   Resp 13   Wt 63.5 kg   SpO2 98%   BMI 21.29 kg/m   Final Clinical Impression(s) / ED Diagnoses Final diagnoses:  Altered mental status, unspecified altered mental status type  Weakness  Traumatic brain injury, with loss of  consciousness of 30 minutes or less, subsequent encounter  Status post craniectomy    Rx / DC Orders ED Discharge Orders    None     This is a 29 year old male previously suffered traumatic brain injury status post craniotomy presenting with change in mental status as well as increased weakness.  Head CT scan obtained demonstrate interval increase in mass-effect with significant midline shift to the left of 1.5 cm with mild lateral to the left lateral ventricle suggestive of partial entrapment.  12:29 PM Given finding of worsening midline shift and decrease in mental status as well as inability to ambulate or care for himself, I appreciate consultation from on-call neurosurgeon Dr. Sherley Bounds who agrees to see patient and will determine disposition.   1:38 PM Neurosurgeon Dr. Ronnald Ramp has evaluate the CT scan.  Initial plan was to admit the patient to neurosurgery floor however he felt the patient new onset of altered mental status could possibly be due to other source such as infectious source since patient does have evidence of leukocytosis.  He felt CT scan of the brain shows expected changes and without evidence of ischemic infarction or new or acute intracranial hemorrhage, it is less likely to be contributing to to patient's change in mental status.  Patient does not have a fever at this time, will obtain rectal temperature, check lactic acid, check COVID-19 as well as obtain chest x-ray for further work-up.  He will need to be admitted likely by medicine.  3:14 PM I was able to obtain additional history from family member who is now at bedside.  Since the injury, patient was making steady improvement was able to  ambulate and communicate fine however within the past 5 days he has become increasingly weak, is eating less, still complaining of headache and now unable to get up to perform any daily activity.  He was started on Topamax 2 weeks ago but family report no improvement of his symptoms.  I wonder if Topamax may have contributed to some of his current symptoms.  COVID-19 test currently is negative, UA showed no signs of infection, chest x-ray unremarkable, UDS unremarkable, white count is elevated at 14.5However I did not identified any source of infection.  No nuchal rigidity concerning for meningitis.  We will consult medicine for admission.  3:56 PM No source of infection identified during this ER visit.  Appreciate consultation from internal medicine resident who agrees to see admit patient for further care.  Ziyan Schoon was evaluated in Emergency Department on 11/23/2019 for the symptoms described in the history of present illness. He was evaluated in the context of the global COVID-19 pandemic, which necessitated consideration that the patient might be at risk for infection with the SARS-CoV-2 virus that causes COVID-19. Institutional protocols and algorithms that pertain to the evaluation of patients at risk for COVID-19 are in a state of rapid change based on information released by regulatory bodies including the CDC and federal and state organizations. These policies and algorithms were followed during the patient's care in the ED.    Domenic Moras, PA-C 11/23/19 1557    Fredia Sorrow, MD 12/14/19 [phone]

## 2019-11-24 DIAGNOSIS — R531 Weakness: Secondary | ICD-10-CM

## 2019-11-24 DIAGNOSIS — R4182 Altered mental status, unspecified: Secondary | ICD-10-CM

## 2019-11-24 DIAGNOSIS — G9340 Encephalopathy, unspecified: Secondary | ICD-10-CM

## 2019-11-24 DIAGNOSIS — Z87820 Personal history of traumatic brain injury: Secondary | ICD-10-CM

## 2019-11-24 DIAGNOSIS — D72829 Elevated white blood cell count, unspecified: Secondary | ICD-10-CM

## 2019-11-24 LAB — COMPREHENSIVE METABOLIC PANEL
ALT: 23 U/L (ref 0–44)
AST: 29 U/L (ref 15–41)
Albumin: 3.9 g/dL (ref 3.5–5.0)
Alkaline Phosphatase: 61 U/L (ref 38–126)
Anion gap: 10 (ref 5–15)
BUN: 8 mg/dL (ref 6–20)
CO2: 20 mmol/L — ABNORMAL LOW (ref 22–32)
Calcium: 9.7 mg/dL (ref 8.9–10.3)
Chloride: 112 mmol/L — ABNORMAL HIGH (ref 98–111)
Creatinine, Ser: 0.76 mg/dL (ref 0.61–1.24)
GFR calc Af Amer: >60 mL/min (ref >60)
GFR calc non Af Amer: >60 mL/min (ref >60)
Glucose, Bld: 98 mg/dL (ref 70–99)
Potassium: 3.3 mmol/L — ABNORMAL LOW (ref 3.5–5.1)
Sodium: 142 mmol/L (ref 135–145)
Total Bilirubin: 1.6 mg/dL — ABNORMAL HIGH (ref 0.3–1.2)
Total Protein: 7 g/dL (ref 6.5–8.1)

## 2019-11-24 LAB — CBC
HCT: 42.1 % (ref 39.0–52.0)
Hemoglobin: 14.1 g/dL (ref 13.0–17.0)
MCH: 30.1 pg (ref 26.0–34.0)
MCHC: 33.5 g/dL (ref 30.0–36.0)
MCV: 90 fL (ref 80.0–100.0)
Platelets: 464 10*3/uL — ABNORMAL HIGH (ref 150–400)
RBC: 4.68 MIL/uL (ref 4.22–5.81)
RDW: 13.7 % (ref 11.5–15.5)
WBC: 9.9 10*3/uL (ref 4.0–10.5)
nRBC: 0 % (ref 0.0–0.2)

## 2019-11-24 MED ORDER — CHLORHEXIDINE GLUCONATE CLOTH 2 % EX PADS
6.0000 | MEDICATED_PAD | Freq: Every day | CUTANEOUS | Status: DC
  Administered 2019-11-24 – 2019-11-30 (×6): 6 via TOPICAL

## 2019-11-24 NOTE — Progress Notes (Signed)
Patient ID: Jason Skinner, male   DOB: [birth year], 29 y.o.   MRN: [id number] BP (!) 122/101    Pulse (!) 106    Temp 98.9 F (37.2 C) (Oral)    Resp 15    Ht 5\' 7"  (1.702 m)    Wt 65.6 kg    SpO2 97%    BMI 22.65 kg/m  Alert and oriented, following commands Moving all extremities Complains of head ache Anorexic Stable, doing well perrl

## 2019-11-24 NOTE — Progress Notes (Signed)
HD#1 Subjective:  Overnight Events: none   Patient was laying in bed and appears sleepy. He was able to answer questions in 1-2 word sentences. He states that he continues to have significant pain in his head and neck. Otherwise, he denies chest pain, SHOB, dysuria, or abdominal pain.   Objective:  Vital signs in last 24 hours: Vitals:   11/24/19 0800 11/24/19 0900 11/24/19 1000 11/24/19 1100  BP: 108/79 (!) 89/60 118/86 (!) 122/101  Pulse: 89 85 99 (!) 106  Resp: 10 11 14 15   Temp: 98.9 F (37.2 C)     TempSrc: Oral     SpO2: 96% 95% 97% 97%  Weight:      Height:       Supplemental O2: Room Air SpO2: 97 %   Physical Exam:  Physical Exam Constitutional:      General: He is not in acute distress.    Appearance: Normal appearance.     Comments: appeared drowsy, but able to answer questions and maintain attention.  HENT:     Head: Normocephalic and atraumatic.  Eyes:     Extraocular Movements: Extraocular movements intact.  Cardiovascular:     Rate and Rhythm: Normal rate and regular rhythm.     Pulses: Normal pulses.     Heart sounds: Normal heart sounds.  Pulmonary:     Effort: Pulmonary effort is normal.     Breath sounds: Normal breath sounds.  Abdominal:     General: Bowel sounds are normal. There is no distension.     Palpations: Abdomen is soft. There is mass (ride side).     Tenderness: There is no abdominal tenderness.  Musculoskeletal:        General: Normal range of motion.     Cervical back: Normal range of motion.     Right lower leg: No edema.     Left lower leg: No edema.  Skin:    General: Skin is warm and dry.  Neurological:     Mental Status: He is alert and oriented to person, place, and time. Mental status is at baseline.  Psychiatric:        Mood and Affect: Mood normal.     Filed Weights   11/23/19 1040 11/23/19 1650  Weight: 63.5 kg 65.6 kg     Intake/Output Summary (Last 24 hours) at 11/24/2019 1219 Last data filed at  11/24/2019 0600 Gross per 24 hour  Intake 1000 ml  Output 950 ml  Net 50 ml   Net IO Since Admission: 50 mL [11/24/19 1219]  Pertinent Labs: CBC Latest Ref Rng & Units 11/24/2019 11/23/2019 10/02/2019  WBC 4.0 - 10.5 K/uL 9.9 14.5(H) 7.4  Hemoglobin 13.0 - 17.0 g/dL 14.1 15.6 13.9  Hematocrit 39 - 52 % 42.1 46.0 41.9  Platelets 150 - 400 K/uL 464(H) 485(H) 376    CMP Latest Ref Rng & Units 11/24/2019 11/23/2019 10/01/2019  Glucose 70 - 99 mg/dL 98 109(H) 94  BUN 6 - 20 mg/dL 8 <5(L) 13  Creatinine 0.61 - 1.24 mg/dL 0.76 0.77 0.79  Sodium 135 - 145 mmol/L 142 141 139  Potassium 3.5 - 5.1 mmol/L 3.3(L) 3.2(L) 3.6  Chloride 98 - 111 mmol/L 112(H) 109 104  CO2 22 - 32 mmol/L 20(L) 19(L) 26  Calcium 8.9 - 10.3 mg/dL 9.7 9.7 9.8  Total Protein 6.5 - 8.1 g/dL 7.0 8.2(H) -  Total Bilirubin 0.3 - 1.2 mg/dL 1.6(H) 1.1 -  Alkaline Phos 38 - 126 U/L 61 68 -  AST 15 - 41 U/L 29 29 -  ALT 0 - 44 U/L 23 25 -    Imaging: No new imaging today.    Assessment/Plan:   Active Problems:   Status post craniectomy   Encephalopathy   Patient Summary: Jason Skinner is a 29 y.o. 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   Acute Encephalopathy of unknown etiology: All infectious work up has been unremarkable. He is normotensive, saturating well on room air. CXR and urinalysis was within normal limits. Blood cultures and urine cultures are pending, but I suspect that these will be normal. UDS did not show any signs of toxic encephalopathy and he does not have any significant electrolyte abnormalities on metabolic panel.  Patient is on a significant amount of centrally acting medications which could be contributing to his acute encephalopathy. Specifically, he is  taking meclizine, methocarbamol, topiramate and bethanechol. I would recommend that we hold all of these medication to see if his mentation improves.  - Hold centrally acting medication and re-evaluate his mentation tomorrow.  - Continue to treat his pain with oxycodone 5 mg q 4 hours prn.   Marianna Payment, D.O. [facility], PGY-2 Pager: [phone], Phone: [phone] Date 11/24/2019 Time 12:19 PM  Please contact the on call pager after 5 pm and on weekends at [phone].

## 2019-11-25 LAB — COMPREHENSIVE METABOLIC PANEL
ALT: 23 U/L (ref 0–44)
AST: 20 U/L (ref 15–41)
Albumin: 3.6 g/dL (ref 3.5–5.0)
Alkaline Phosphatase: 55 U/L (ref 38–126)
Anion gap: 10 (ref 5–15)
BUN: 6 mg/dL (ref 6–20)
CO2: 19 mmol/L — ABNORMAL LOW (ref 22–32)
Calcium: 9.3 mg/dL (ref 8.9–10.3)
Chloride: 111 mmol/L (ref 98–111)
Creatinine, Ser: 0.69 mg/dL (ref 0.61–1.24)
GFR calc Af Amer: >60 mL/min (ref >60)
GFR calc non Af Amer: >60 mL/min (ref >60)
Glucose, Bld: 96 mg/dL (ref 70–99)
Potassium: 3.4 mmol/L — ABNORMAL LOW (ref 3.5–5.1)
Sodium: 140 mmol/L (ref 135–145)
Total Bilirubin: 1.3 mg/dL — ABNORMAL HIGH (ref 0.3–1.2)
Total Protein: 6.8 g/dL (ref 6.5–8.1)

## 2019-11-25 LAB — URINE CULTURE: Culture: <10000 — AB

## 2019-11-25 LAB — CBC
HCT: 39.6 % (ref 39.0–52.0)
Hemoglobin: 13.2 g/dL (ref 13.0–17.0)
MCH: 30.5 pg (ref 26.0–34.0)
MCHC: 33.3 g/dL (ref 30.0–36.0)
MCV: 91.5 fL (ref 80.0–100.0)
Platelets: 406 10*3/uL — ABNORMAL HIGH (ref 150–400)
RBC: 4.33 MIL/uL (ref 4.22–5.81)
RDW: 13.6 % (ref 11.5–15.5)
WBC: 8.7 10*3/uL (ref 4.0–10.5)
nRBC: 0 % (ref 0.0–0.2)

## 2019-11-25 NOTE — Progress Notes (Signed)
NEUROSURGERY PROGRESS NOTE  Doing well. According to nurse he has had some headaches but is alert oriented and MAE. Plan is for cranioplasty at some point this week hopefully by Dr. Arnoldo Morale.   Temp:  [98.4 F (36.9 C)-99.7 F (37.6 C)] 98.9 F (37.2 C) (09/19 0800) Pulse Rate:  [66-106] 78 (09/19 0800) Resp:  [10-15] 12 (09/19 0800) BP: (85-126)/(58-101) 85/58 (09/19 0800) SpO2:  [96 %-100 %] 98 % (09/19 0800) Weight:  [71.1 kg] 71.1 kg (09/19 0600)   Eleonore Chiquito, NP 11/25/2019 9:13 AM

## 2019-11-25 NOTE — Progress Notes (Addendum)
HD#2 Subjective:  Overnight Events: no overnight events   Patient seems to be improved today. He continues to have significant head pain, but his strength is back to normal. Patient's brother was in the room and confirmed that he had comparable strength form before the admission. Patient was answering questions appropriately and denies any weakness or pain anywhere else. He deenied fever, shortness of breath, chest pain, chills, dysuria, abdominal pain.  Objective:  Vital signs in last 24 hours: Vitals:   11/25/19 0300 11/25/19 0400 11/25/19 0500 11/25/19 0600  BP: (!) 123/92 117/86 99/69 97/75   Pulse: (!) 105 98 72 71  Resp: 13 15 10 14   Temp:  98.4 F (36.9 C)    TempSrc:  Oral    SpO2: 99% 98% 98% 99%  Weight:    71.1 kg  Height:       Supplemental O2: Room Air SpO2: 99 %   Physical Exam:  Physical Exam HENT:     Head: Normocephalic and atraumatic.  Eyes:     Extraocular Movements: Extraocular movements intact.     Pupils: Pupils are equal, round, and reactive to light.  Cardiovascular:     Rate and Rhythm: Normal rate and regular rhythm.     Pulses: Normal pulses.     Heart sounds: Normal heart sounds.  Pulmonary:     Effort: Pulmonary effort is normal. No respiratory distress.     Breath sounds: Normal breath sounds. No wheezing or rales.  Chest:     Chest wall: No tenderness.  Abdominal:     General: Bowel sounds are normal. There is no distension.     Palpations: Abdomen is soft.     Tenderness: There is no abdominal tenderness.  Musculoskeletal:        General: No swelling or tenderness. Normal range of motion.     Cervical back: Normal range of motion. No rigidity. Tenderness: on the right side of his neck mucles felt tight and TTP.     Right lower leg: No edema.     Left lower leg: No edema.  Skin:    General: Skin is warm and dry.  Neurological:     Mental Status: He is alert and oriented to person, place, and time. Mental status is at baseline.      Sensory: No sensory deficit.     Motor: No weakness.     Coordination: Coordination normal.     Comments: headache  Psychiatric:        Mood and Affect: Mood normal.     Filed Weights   11/23/19 1040 11/23/19 1650 11/25/19 0600  Weight: 63.5 kg 65.6 kg 71.1 kg     Intake/Output Summary (Last 24 hours) at 11/25/2019 0703 Last data filed at 11/25/2019 0600 Gross per 24 hour  Intake 2400 ml  Output 1300 ml  Net 1100 ml   Net IO Since Admission: 1,150 mL [11/25/19 0703]  Pertinent Labs: CBC Latest Ref Rng & Units 11/24/2019 11/23/2019 10/02/2019  WBC 4.0 - 10.5 K/uL 9.9 14.5(H) 7.4  Hemoglobin 13.0 - 17.0 g/dL 14.1 15.6 13.9  Hematocrit 39 - 52 % 42.1 46.0 41.9  Platelets 150 - 400 K/uL 464(H) 485(H) 376    CMP Latest Ref Rng & Units 11/24/2019 11/23/2019 10/01/2019  Glucose 70 - 99 mg/dL 98 109(H) 94  BUN 6 - 20 mg/dL 8 <5(L) 13  Creatinine 0.61 - 1.24 mg/dL 0.76 0.77 0.79  Sodium 135 - 145 mmol/L 142 141 139  Potassium 3.5 -  5.1 mmol/L 3.3(L) 3.2(L) 3.6  Chloride 98 - 111 mmol/L 112(H) 109 104  CO2 22 - 32 mmol/L 20(L) 19(L) 26  Calcium 8.9 - 10.3 mg/dL 9.7 9.7 9.8  Total Protein 6.5 - 8.1 g/dL 7.0 8.2(H) -  Total Bilirubin 0.3 - 1.2 mg/dL 1.6(H) 1.1 -  Alkaline Phos 38 - 126 U/L 61 68 -  AST 15 - 41 U/L 29 29 -  ALT 0 - 44 U/L 23 25 -    Imaging: No results found.  Assessment/Plan:   Active Problems:   Status post craniectomy   Encephalopathy   Patient Summary: Jason Skinner is a 29 y.o. 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   Acute Encephalopathy of unknown etiology: Patient is improved today and does not appear encephalopathic. Patient is still less active than at baseline, but he attributes this to his headache that  is worse with movement. Neurosurgery plan to take him to the operating room sometime this week . We appreciate this consult and feel free to reach out to the IMTS team if additional questions or concerns arise.  - Continue to limit centrally acting medication when possible - Continue to treat his pain with oxycodone 5 mg q 4 hours prn.  Marianna Payment, D.O. [facility], PGY-2 Pager: [phone], Phone: [phone] Date 11/25/2019 Time 5:31 PM  Please contact the on call pager after 5 pm and on weekends at [phone].

## 2019-11-25 NOTE — Plan of Care (Signed)
  Problem: Nutrition: Goal: Adequate nutrition will be maintained Outcome: Progressing   

## 2019-11-25 NOTE — Plan of Care (Signed)
°  Problem: Coping: °Goal: Level of anxiety will decrease °Outcome: Progressing °  °

## 2019-11-26 NOTE — Progress Notes (Signed)
Subjective: The patient is alert and attentive.  We have a language barrier.  The patient is using his cell phone.  I communicated with his cousin who was at the bedside via the video translator.  Objective: Vital signs in last 24 hours: Temp:  [98.1 F (36.7 C)-98.9 F (37.2 C)] 98.4 F (36.9 C) (09/20 0800) Pulse Rate:  [70-121] 82 (09/20 1100) Resp:  [9-24] 12 (09/20 1100) BP: (100-143)/(70-92) 105/71 (09/20 1100) SpO2:  [94 %-99 %] 98 % (09/20 1100) Estimated body mass index is 24.55 kg/m as calculated from the following:   Height as of this encounter: 5\' 7"  (1.702 m).   Weight as of this encounter: 71.1 kg.   Intake/Output from previous day: 09/19 0701 - 09/20 0700 In: 1000 [I.V.:1000] Out: 3400 [Urine:3400] Intake/Output this shift: Total I/O In: 500 [I.V.:500] Out: 750 [Urine:750]  Physical exam the patient is alert and attentive.  His craniectomy site is quite sunken.  Lab Results: Recent Labs    11/24/19 0307 11/25/19 0721  WBC 9.9 8.7  HGB 14.1 13.2  HCT 42.1 39.6  PLT 464* 406*   BMET Recent Labs    11/24/19 0307 11/25/19 0721  NA 142 140  K 3.3* 3.4*  CL 112* 111  CO2 20* 19*  GLUCOSE 98 96  BUN 8 6  CREATININE 0.76 0.69  CALCIUM 9.7 9.3    Studies/Results: No results found.  Assessment/Plan: Syndrome of the trephined: I have discussed the situation with the patient's cousin.  We are going to attempt to contact his wife, who is the next of kin to get consent as I am not sure he is competent to give consent presently.  Perhaps we can do the surgery tomorrow evening.  LOS: 3 days     Ophelia Charter 11/26/2019, 11:55 AM     Patient ID: Jason Skinner, male   DOB: 1991/01/01, 29 y.o.   MRN: [id number]

## 2019-11-26 NOTE — Progress Notes (Signed)
Pt's wife, [name], called from [place] with the help of a translator.  She requested a letter stating her husband will need assistance once he is discharged home.  This letter is necessary for her to be able to leave the country for a short period of time.  It was emailed to her address: [email or web address]

## 2019-11-26 NOTE — Progress Notes (Signed)
Pt.s wife, Dessie Coma, lives in [place].  Her phone number is [phone]. Dr. Arnoldo Morale explained to the patient and his cousin via the interpreter that she will be called to give consent for his upcoming surgery.

## 2019-11-27 ENCOUNTER — Other Ambulatory Visit: Payer: Self-pay | Admitting: Neurosurgery

## 2019-11-27 NOTE — Progress Notes (Signed)
Subjective: The patient is alert and attentive.  He answers simple questions.  He is in no apparent distress.  Objective: Vital signs in last 24 hours: Temp:  [98.4 F (36.9 C)-99.6 F (37.6 C)] 98.8 F (37.1 C) (09/21 0400) Pulse Rate:  [66-107] 72 (09/21 0700) Resp:  [10-25] 12 (09/21 0700) BP: (94-124)/(66-92) 101/66 (09/21 0700) SpO2:  [94 %-98 %] 96 % (09/21 0700) Estimated body mass index is 24.55 kg/m as calculated from the following:   Height as of this encounter: 5\' 7"  (1.702 m).   Weight as of this encounter: 71.1 kg.   Intake/Output from previous day: 09/20 0701 - 09/21 0700 In: 1400 [I.V.:1400] Out: 4100 [Urine:4100] Intake/Output this shift: No intake/output data recorded.  Physical exam the patient is alert and attentive.  He will answer simple questions.  He is moving all 4 extremities well.  His craniectomy flap is sunken.  His craniotomy wound has healed well.  His abdominal incision is healed well.  Lab Results: Recent Labs    11/25/19 0721  WBC 8.7  HGB 13.2  HCT 39.6  PLT 406*   BMET Recent Labs    11/25/19 0721  NA 140  K 3.4*  CL 111  CO2 19*  GLUCOSE 96  BUN 6  CREATININE 0.69  CALCIUM 9.3    Studies/Results: No results found.  Assessment/Plan: Syndrome of the trephined: We will plan to replace his craniotomy flap tomorrow evening.  LOS: 4 days     Ophelia Charter 11/27/2019, 7:40 AM     Patient ID: Jason Skinner, male   DOB: 1990-07-30, 29 y.o.   MRN: [id number]

## 2019-11-28 ENCOUNTER — Encounter (HOSPITAL_COMMUNITY)
Admission: EM | Disposition: A | Discharge status: Rehabilitation Facility | Payer: Self-pay | Source: Home / Self Care | Attending: Neurosurgery

## 2019-11-28 ENCOUNTER — Inpatient Hospital Stay (HOSPITAL_COMMUNITY): Payer: Self-pay | Admitting: Certified Registered Nurse Anesthetist

## 2019-11-28 ENCOUNTER — Encounter (HOSPITAL_COMMUNITY): Payer: Self-pay | Admitting: Neurosurgery

## 2019-11-28 HISTORY — PX: CRANIOTOMY: SHX93

## 2019-11-28 LAB — CULTURE, BLOOD (ROUTINE X 2)
Culture: NO GROWTH
Culture: NO GROWTH
Special Requests: ADEQUATE
Special Requests: ADEQUATE

## 2019-11-28 SURGERY — CRANIOTOMY BONE FLAP/PROSTHETIC PLATE
Anesthesia: General | Site: Head

## 2019-11-28 MED ORDER — ONDANSETRON HCL 4 MG/2ML IJ SOLN
INTRAMUSCULAR | Status: AC
  Filled 2019-11-28: qty 2

## 2019-11-28 MED ORDER — FENTANYL CITRATE (PF) 250 MCG/5ML IJ SOLN
INTRAMUSCULAR | Status: AC
  Filled 2019-11-28: qty 5

## 2019-11-28 MED ORDER — ONDANSETRON HCL 4 MG PO TABS: 4.0000 mg | ORAL_TABLET | ORAL | Status: DC | PRN

## 2019-11-28 MED ORDER — ONDANSETRON HCL 4 MG/2ML IJ SOLN: 4.0000 mg | INTRAMUSCULAR | Status: DC | PRN

## 2019-11-28 MED ORDER — CEFAZOLIN SODIUM-DEXTROSE 2-4 GM/100ML-% IV SOLN
2.0000 g | Freq: Three times a day (TID) | INTRAVENOUS | Status: DC
  Administered 2019-11-29 – 2019-12-02 (×11): 2 g via INTRAVENOUS
  Filled 2019-11-28 (×11): qty 100

## 2019-11-28 MED ORDER — 0.9 % SODIUM CHLORIDE (POUR BTL) OPTIME
TOPICAL | Status: DC | PRN
  Administered 2019-11-28 (×3): 1000 mL

## 2019-11-28 MED ORDER — ACETAMINOPHEN 325 MG PO TABS
650.0000 mg | ORAL_TABLET | ORAL | Status: DC | PRN
  Administered 2019-11-30: 650 mg via ORAL
  Filled 2019-11-28: qty 2

## 2019-11-28 MED ORDER — PROPOFOL 10 MG/ML IV BOLUS
INTRAVENOUS | Status: AC
  Filled 2019-11-28: qty 20

## 2019-11-28 MED ORDER — LACTATED RINGERS IV SOLN: INTRAVENOUS | Status: DC | PRN

## 2019-11-28 MED ORDER — MIDAZOLAM HCL 2 MG/2ML IJ SOLN
INTRAMUSCULAR | Status: AC
  Filled 2019-11-28: qty 2

## 2019-11-28 MED ORDER — LIDOCAINE HCL (CARDIAC) PF 100 MG/5ML IV SOSY
PREFILLED_SYRINGE | INTRAVENOUS | Status: DC | PRN
  Administered 2019-11-28: 60 mg via INTRAVENOUS

## 2019-11-28 MED ORDER — DEXAMETHASONE SODIUM PHOSPHATE 10 MG/ML IJ SOLN
INTRAMUSCULAR | Status: DC | PRN
  Administered 2019-11-28: 4 mg via INTRAVENOUS

## 2019-11-28 MED ORDER — SUGAMMADEX SODIUM 200 MG/2ML IV SOLN
INTRAVENOUS | Status: DC | PRN
  Administered 2019-11-28: 200 mg via INTRAVENOUS

## 2019-11-28 MED ORDER — PANTOPRAZOLE SODIUM 40 MG IV SOLR
40.0000 mg | Freq: Every day | INTRAVENOUS | Status: DC
  Administered 2019-11-28 – 2019-11-29 (×2): 40 mg via INTRAVENOUS
  Filled 2019-11-28 (×2): qty 40

## 2019-11-28 MED ORDER — LABETALOL HCL 5 MG/ML IV SOLN: 10.0000 mg | INTRAVENOUS | Status: DC | PRN

## 2019-11-28 MED ORDER — OXYCODONE HCL 5 MG PO TABS: 5.0000 mg | ORAL_TABLET | Freq: Once | ORAL | Status: DC | PRN

## 2019-11-28 MED ORDER — FENTANYL CITRATE (PF) 100 MCG/2ML IJ SOLN
INTRAMUSCULAR | Status: AC
Start: 2019-11-28 — End: 2019-11-29
  Filled 2019-11-28: qty 2

## 2019-11-28 MED ORDER — ONDANSETRON HCL 4 MG/2ML IJ SOLN: 4.0000 mg | Freq: Four times a day (QID) | INTRAMUSCULAR | Status: DC | PRN

## 2019-11-28 MED ORDER — OXYCODONE HCL 5 MG/5ML PO SOLN: 5.0000 mg | Freq: Once | ORAL | Status: DC | PRN

## 2019-11-28 MED ORDER — LIDOCAINE 2% (20 MG/ML) 5 ML SYRINGE
INTRAMUSCULAR | Status: AC
  Filled 2019-11-28: qty 5

## 2019-11-28 MED ORDER — ESMOLOL HCL 100 MG/10ML IV SOLN
INTRAVENOUS | Status: AC
  Filled 2019-11-28: qty 10

## 2019-11-28 MED ORDER — PHENYLEPHRINE 40 MCG/ML (10ML) SYRINGE FOR IV PUSH (FOR BLOOD PRESSURE SUPPORT)
PREFILLED_SYRINGE | INTRAVENOUS | Status: AC
  Filled 2019-11-28: qty 10

## 2019-11-28 MED ORDER — THROMBIN 5000 UNITS EX SOLR: OROMUCOSAL | Status: DC | PRN

## 2019-11-28 MED ORDER — HYDROMORPHONE HCL 1 MG/ML IJ SOLN
INTRAMUSCULAR | Status: AC
  Filled 2019-11-28: qty 0.5

## 2019-11-28 MED ORDER — POTASSIUM CHLORIDE IN NACL 20-0.9 MEQ/L-% IV SOLN
INTRAVENOUS | Status: DC
  Filled 2019-11-28 (×4): qty 1000

## 2019-11-28 MED ORDER — HYDROCODONE-ACETAMINOPHEN 5-325 MG PO TABS
1.0000 | ORAL_TABLET | ORAL | Status: DC | PRN
  Administered 2019-11-29 – 2019-12-04 (×8): 1 via ORAL
  Filled 2019-11-28 (×8): qty 1

## 2019-11-28 MED ORDER — LEVETIRACETAM IN NACL 500 MG/100ML IV SOLN
500.0000 mg | Freq: Once | INTRAVENOUS | Status: AC
  Administered 2019-11-28: 500 mg via INTRAVENOUS
  Filled 2019-11-28: qty 100

## 2019-11-28 MED ORDER — BUPIVACAINE-EPINEPHRINE 0.5% -1:200000 IJ SOLN
INTRAMUSCULAR | Status: AC
  Filled 2019-11-28: qty 1

## 2019-11-28 MED ORDER — SODIUM CHLORIDE 0.9 % IV SOLN: INTRAVENOUS | Status: DC

## 2019-11-28 MED ORDER — LEVETIRACETAM IN NACL 500 MG/100ML IV SOLN
500.0000 mg | Freq: Two times a day (BID) | INTRAVENOUS | Status: DC
  Administered 2019-11-29 – 2019-11-30 (×3): 500 mg via INTRAVENOUS
  Filled 2019-11-28 (×3): qty 100

## 2019-11-28 MED ORDER — BACITRACIN ZINC 500 UNIT/GM EX OINT
TOPICAL_OINTMENT | CUTANEOUS | Status: DC | PRN
  Administered 2019-11-28: 1 via TOPICAL

## 2019-11-28 MED ORDER — CHLORHEXIDINE GLUCONATE 0.12 % MT SOLN
15.0000 mL | OROMUCOSAL | Status: AC
  Filled 2019-11-28: qty 15

## 2019-11-28 MED ORDER — FENTANYL CITRATE (PF) 100 MCG/2ML IJ SOLN
25.0000 ug | INTRAMUSCULAR | Status: DC | PRN
  Administered 2019-11-28 (×2): 25 ug via INTRAVENOUS

## 2019-11-28 MED ORDER — ROCURONIUM BROMIDE 10 MG/ML (PF) SYRINGE
PREFILLED_SYRINGE | INTRAVENOUS | Status: AC
  Filled 2019-11-28: qty 30

## 2019-11-28 MED ORDER — PROPOFOL 10 MG/ML IV BOLUS
INTRAVENOUS | Status: DC | PRN
  Administered 2019-11-28: 150 mg via INTRAVENOUS

## 2019-11-28 MED ORDER — PROMETHAZINE HCL 12.5 MG PO TABS
12.5000 mg | ORAL_TABLET | ORAL | Status: DC | PRN
  Filled 2019-11-28: qty 2

## 2019-11-28 MED ORDER — THROMBIN 5000 UNITS EX SOLR
CUTANEOUS | Status: AC
  Filled 2019-11-28: qty 5000

## 2019-11-28 MED ORDER — PHENYLEPHRINE HCL (PRESSORS) 10 MG/ML IV SOLN
INTRAVENOUS | Status: DC | PRN
  Administered 2019-11-28: 80 ug via INTRAVENOUS
  Administered 2019-11-28: 160 ug via INTRAVENOUS
  Administered 2019-11-28: 80 ug via INTRAVENOUS

## 2019-11-28 MED ORDER — DEXAMETHASONE SODIUM PHOSPHATE 10 MG/ML IJ SOLN
INTRAMUSCULAR | Status: AC
  Filled 2019-11-28: qty 1

## 2019-11-28 MED ORDER — ONDANSETRON HCL 4 MG/2ML IJ SOLN
INTRAMUSCULAR | Status: DC | PRN
  Administered 2019-11-28: 4 mg via INTRAVENOUS

## 2019-11-28 MED ORDER — ROCURONIUM BROMIDE 100 MG/10ML IV SOLN
INTRAVENOUS | Status: DC | PRN
  Administered 2019-11-28: 70 mg via INTRAVENOUS
  Administered 2019-11-28: 20 mg via INTRAVENOUS

## 2019-11-28 MED ORDER — BACITRACIN ZINC 500 UNIT/GM EX OINT
TOPICAL_OINTMENT | CUTANEOUS | Status: AC
  Filled 2019-11-28: qty 28.35

## 2019-11-28 MED ORDER — METOPROLOL TARTRATE 5 MG/5ML IV SOLN
INTRAVENOUS | Status: AC
  Filled 2019-11-28: qty 5

## 2019-11-28 MED ORDER — ESMOLOL HCL 100 MG/10ML IV SOLN
INTRAVENOUS | Status: DC | PRN
  Administered 2019-11-28: 20 mg via INTRAVENOUS

## 2019-11-28 MED ORDER — METOPROLOL TARTRATE 5 MG/5ML IV SOLN
2.0000 mg | Freq: Once | INTRAVENOUS | Status: AC
  Administered 2019-11-28: 2 mg via INTRAVENOUS

## 2019-11-28 MED ORDER — FENTANYL CITRATE (PF) 100 MCG/2ML IJ SOLN
INTRAMUSCULAR | Status: DC | PRN
Start: 2019-11-28 — End: 2019-11-28
  Administered 2019-11-28 (×2): 50 ug via INTRAVENOUS
  Administered 2019-11-28: 100 ug via INTRAVENOUS
  Administered 2019-11-28: 50 ug via INTRAVENOUS

## 2019-11-28 MED ORDER — DOCUSATE SODIUM 100 MG PO CAPS
100.0000 mg | ORAL_CAPSULE | Freq: Two times a day (BID) | ORAL | Status: DC
  Administered 2019-11-29 – 2019-12-04 (×10): 100 mg via ORAL
  Filled 2019-11-28 (×11): qty 1

## 2019-11-28 MED ORDER — HYDROMORPHONE HCL 1 MG/ML IJ SOLN
INTRAMUSCULAR | Status: DC | PRN
  Administered 2019-11-28: .5 mg via INTRAVENOUS

## 2019-11-28 MED ORDER — WHITE PETROLATUM EX OINT
TOPICAL_OINTMENT | CUTANEOUS | Status: AC
  Filled 2019-11-28: qty 28.35

## 2019-11-28 MED ORDER — DEXMEDETOMIDINE (PRECEDEX) IN NS 20 MCG/5ML (4 MCG/ML) IV SYRINGE
PREFILLED_SYRINGE | INTRAVENOUS | Status: AC
  Filled 2019-11-28: qty 5

## 2019-11-28 MED ORDER — ACETAMINOPHEN 650 MG RE SUPP: 650.0000 mg | RECTAL | Status: DC | PRN

## 2019-11-28 MED ORDER — CEFAZOLIN SODIUM-DEXTROSE 2-3 GM-%(50ML) IV SOLR
INTRAVENOUS | Status: DC | PRN
  Administered 2019-11-28: 2 g via INTRAVENOUS

## 2019-11-28 MED ORDER — MORPHINE SULFATE (PF) 2 MG/ML IV SOLN
2.0000 mg | INTRAVENOUS | Status: DC | PRN
  Administered 2019-11-28: 4 mg via INTRAVENOUS
  Administered 2019-11-30: 2 mg via INTRAVENOUS
  Filled 2019-11-28: qty 1
  Filled 2019-11-28: qty 2

## 2019-11-28 SURGICAL SUPPLY — 41 items
BAG DECANTER FOR FLEXI CONT (MISCELLANEOUS) IMPLANT
BLADE CLIPPER SPEC (BLADE) ×2 IMPLANT
BUR PRECISION FLUTE 6.0 (BURR) ×2 IMPLANT
CANISTER SUCT 3000ML PPV (MISCELLANEOUS) ×6 IMPLANT
CARTRIDGE OIL MAESTRO DRILL (MISCELLANEOUS) ×1 IMPLANT
DIFFUSER DRILL AIR PNEUMATIC (MISCELLANEOUS) ×2 IMPLANT
DRAPE INCISE IOBAN 66X45 STRL (DRAPES) ×2 IMPLANT
DRAPE NEUROLOGICAL W/INCISE (DRAPES) ×2 IMPLANT
DRAPE SURG 17X23 STRL (DRAPES) ×8 IMPLANT
DRAPE WARM FLUID 44X44 (DRAPES) ×2 IMPLANT
ELECT REM PT RETURN 9FT ADLT (ELECTROSURGICAL) ×2
ELECTRODE REM PT RTRN 9FT ADLT (ELECTROSURGICAL) ×1 IMPLANT
GAUZE 4X4 16PLY RFD (DISPOSABLE) ×4 IMPLANT
GAUZE SPONGE 4X4 12PLY STRL (GAUZE/BANDAGES/DRESSINGS) ×4 IMPLANT
GLOVE BIO SURGEON STRL SZ8 (GLOVE) ×4 IMPLANT
GLOVE BIO SURGEON STRL SZ8.5 (GLOVE) ×4 IMPLANT
GOWN STRL REUS W/ TWL LRG LVL3 (GOWN DISPOSABLE) ×2 IMPLANT
GOWN STRL REUS W/ TWL XL LVL3 (GOWN DISPOSABLE) ×3 IMPLANT
GOWN STRL REUS W/TWL LRG LVL3 (GOWN DISPOSABLE) ×4
GOWN STRL REUS W/TWL XL LVL3 (GOWN DISPOSABLE) ×6
HEMOSTAT POWDER KIT SURGIFOAM (HEMOSTASIS) ×2 IMPLANT
KIT BASIN OR (CUSTOM PROCEDURE TRAY) ×2 IMPLANT
KIT TURNOVER KIT B (KITS) ×2 IMPLANT
MARKER SKIN DUAL TIP RULER LAB (MISCELLANEOUS) ×2 IMPLANT
NEEDLE HYPO 22GX1.5 SAFETY (NEEDLE) ×2 IMPLANT
NS IRRIG 1000ML POUR BTL (IV SOLUTION) ×6 IMPLANT
OIL CARTRIDGE MAESTRO DRILL (MISCELLANEOUS) ×2
PACK CRANIOTOMY CUSTOM (CUSTOM PROCEDURE TRAY) ×2 IMPLANT
PIN MAYFIELD SKULL DISP (PIN) ×2 IMPLANT
PLATE BONE 12 2H TARGET XL (Plate) ×8 IMPLANT
SCREW UNIII AXS SD 1.5X4 (Screw) ×16 IMPLANT
STAPLER SKIN PROX WIDE 3.9 (STAPLE) ×2 IMPLANT
SUT ETHILON 3 0 FSL (SUTURE) IMPLANT
SUT NURALON 4 0 TR CR/8 (SUTURE) ×2 IMPLANT
SUT VIC AB 2-0 CP2 18 (SUTURE) ×2 IMPLANT
TAPE CLOTH SURG 4X10 WHT LF (GAUZE/BANDAGES/DRESSINGS) ×4 IMPLANT
TOWEL GREEN STERILE (TOWEL DISPOSABLE) ×2 IMPLANT
TOWEL GREEN STERILE FF (TOWEL DISPOSABLE) ×2 IMPLANT
TRAY FOLEY MTR SLVR 16FR STAT (SET/KITS/TRAYS/PACK) IMPLANT
UNDERPAD 30X36 HEAVY ABSORB (UNDERPADS AND DIAPERS) IMPLANT
WATER STERILE IRR 1000ML POUR (IV SOLUTION) ×2 IMPLANT

## 2019-11-28 NOTE — Op Note (Signed)
Brief history: The patient is a 29 year old Hispanic male who fell off a ladder a few months ago suffering a severe traumatic brain injury.  I performed a craniectomy for evacuation of subdural hematoma at that time.  The patient made a very good recovery but has presented with the syndrome of the trephined.  I discussed the situation with his wife.  I recommended replacement of his craniectomy flap.  She consented after weighing the risk, benefits and alternatives.  Pre-op diagnosis: Syndrome of the trephined  Postop diagnosis: The same  Procedure: Retrieval of craniectomy flap from the abdomen; cranioplasty  Surgeon: Dr. Earle Gell  Assistant: Arnetha Massy, NP  Anesthesia: General endotracheal  Estimated blood loss: 150 cc  Specimens: None  Drains: 1 epidural Jackson-Pratt drain  Complications: None  Description of procedure: The patient was brought to the operating room by the anesthesia team.  General endotracheal anesthesia was induced.  He remained in the supine position.  I applied the [place] three-point headrest to his calvarium.  A roll was placed on his right shoulder and his head was turned to the left exposing his right scalp.  His right scalp  was then shaved with clippers and prepared with Betadine scrub and Betadine solution.  His right abdominal region was also prepared with Betadine scrub and Betadine solution.  Sterile drapes were applied.  I then used a scalpel to incise through the patient's abdominal scar.  I used electrocautery to expose the craniectomy flap which was previously placed in the subcutaneous fat.  I retrieved the craniectomy flap.  We then closed the patient's abdominal wound with interrupted 2-0 Vicryl sutures in the subcutaneous tissue and stainless steel staples in the skin.  I then used a scalpel to incise through the patient's craniectomy scar.  We used Raney clips for wound edge hemostasis.  I selected cautery to elevate the patient's  craniectomy scalp flap.  Expose the edges of the craniectomy.  We placed a 10 mm flat Jackson-Pratt drain in the epidural space and tunneled it out through a separate bur hole and stab wound.  We then replaced the patient's craniectomy flap with titanium mini plates and screws.  We then approximated the patient's galea with interrupted 2-0 Vicryl suture.  We reapproximate the skin with stainless steel staples.  The wounds were then coated with bacitracin ointment.  Sterile dressings were applied.  The drapes were removed.  I then remove the [place] three-point headrest from the patient's calvarium.  By report all sponge, instrument, and needle counts were correct at the end of this case.

## 2019-11-28 NOTE — Transfer of Care (Signed)
Immediate Anesthesia Transfer of Care Note  Patient: Jason Skinner  Procedure(s) Performed: REMOVAL OF CRANIAL BONE FLAP FROM ABDOMEN WITH CRANIOTOMY FOR IMPLANTATION OF BONE FLAP (N/A Head)  Patient Location: PACU  Anesthesia Type:General  Level of Consciousness: drowsy  Airway & Oxygen Therapy: Patient Spontanous Breathing and Patient connected to face mask oxygen  Post-op Assessment: Report given to RN and Post -op Vital signs reviewed and stable  Post vital signs: Reviewed and stable  Last Vitals:  Vitals Value Taken Time  BP 122/87 11/28/19 1910  Temp    Pulse 91 11/28/19 1914  Resp 7 11/28/19 1914  SpO2 100 % 11/28/19 1914  Vitals shown include unvalidated device data.  Last Pain:  Vitals:   11/28/19 1200  TempSrc: Oral  PainSc: 0-No pain      Patients Stated Pain Goal: 0 (15/87/27 6184)  Complications: No complications documented.

## 2019-11-28 NOTE — Progress Notes (Signed)
   Providing Compassionate, Quality Care - Together     I spoke with the patient's wife, Dessie Coma, via telephone with translator, Rob Bunting, and registered nurse, Delcie Roch, present in order to obtain consent for surgery later today. The procedure of replacing the patient's right-sided craniectomy bone flap that was stored in his abdomen following emergent surgery in July was explained to Mrs. Marlowe Sax. I explained the risks of surgery including the risk of infection, worsening of the patient's condition, risks associated with anesthesia, and possible lack of viability of the patient's bone flap. All of Mrs. Jannifer Franklin questions were answered. She voiced understanding regarding the procedure and provided consent on behalf of the patient. Nurse Delcie Roch served as witness of the consent. The appropriate documentation was signed and placed in the patient's chart.    Viona Gilmore, DNP, AGNP-C Nurse Practitioner 11/28/2019, 8:59 am  [facility] [address], [place], [place] [address] P: [phone]    F: [phone]

## 2019-11-28 NOTE — Anesthesia Preprocedure Evaluation (Addendum)
Anesthesia Evaluation  Patient identified by MRN, date of birth, ID band Patient awake    Reviewed: Allergy & Precautions, H&P , NPO status , Patient's Chart, lab work & pertinent test results  Airway Mallampati: II  TM Distance: >3 FB Neck ROM: Full    Dental no notable dental hx. (+) Teeth Intact, Dental Advisory Given   Pulmonary neg pulmonary ROS,    Pulmonary exam normal breath sounds clear to auscultation       Cardiovascular negative cardio ROS   Rhythm:Regular Rate:Normal     Neuro/Psych  Headaches, negative psych ROS   GI/Hepatic negative GI ROS, Neg liver ROS,   Endo/Other  negative endocrine ROS  Renal/GU negative Renal ROS  negative genitourinary   Musculoskeletal   Abdominal   Peds  Hematology  (+) Blood dyscrasia, anemia ,   Anesthesia Other Findings   Reproductive/Obstetrics negative OB ROS                            Anesthesia Physical Anesthesia Plan  ASA: II  Anesthesia Plan: General   Post-op Pain Management:    Induction: Intravenous  PONV Risk Score and Plan: 3 and Ondansetron, Dexamethasone and Midazolam  Airway Management Planned: Oral ETT  Additional Equipment:   Intra-op Plan:   Post-operative Plan: Extubation in OR  Informed Consent: I have reviewed the patients History and Physical, chart, labs and discussed the procedure including the risks, benefits and alternatives for the proposed anesthesia with the patient or authorized representative who has indicated his/her understanding and acceptance.     Dental advisory given and Interpreter used for interveiw  Plan Discussed with: CRNA  Anesthesia Plan Comments:        Anesthesia Quick Evaluation

## 2019-11-28 NOTE — Anesthesia Procedure Notes (Signed)
Procedure Name: Intubation Date/Time: 11/28/2019 4:51 PM Performed by: Macarius Ruark T, CRNA Pre-anesthesia Checklist: Patient identified, Emergency Drugs available, Suction available and Patient being monitored Patient Re-evaluated:Patient Re-evaluated prior to induction Oxygen Delivery Method: Circle system utilized Preoxygenation: Pre-oxygenation with 100% oxygen Induction Type: IV induction Ventilation: Mask ventilation without difficulty Laryngoscope Size: Miller and 3 Grade View: Grade I Tube type: Oral Tube size: 7.5 mm Number of attempts: 1 Airway Equipment and Method: Stylet and Oral airway Placement Confirmation: ETT inserted through vocal cords under direct vision,  positive ETCO2 and breath sounds checked- equal and bilateral Secured at: 22 cm Tube secured with: Tape Dental Injury: Teeth and Oropharynx as per pre-operative assessment

## 2019-11-29 ENCOUNTER — Encounter (HOSPITAL_COMMUNITY): Payer: Self-pay | Admitting: Neurosurgery

## 2019-11-29 ENCOUNTER — Encounter: Payer: Self-pay | Attending: Physical Medicine and Rehabilitation | Admitting: Physical Medicine and Rehabilitation

## 2019-11-29 DIAGNOSIS — S065X9A Traumatic subdural hemorrhage with loss of consciousness of unspecified duration, initial encounter: Secondary | ICD-10-CM | POA: Insufficient documentation

## 2019-11-29 DIAGNOSIS — G43809 Other migraine, not intractable, without status migrainosus: Secondary | ICD-10-CM | POA: Insufficient documentation

## 2019-11-29 DIAGNOSIS — S065X3D Traumatic subdural hemorrhage with loss of consciousness of 1 hour to 5 hours 59 minutes, subsequent encounter: Secondary | ICD-10-CM | POA: Insufficient documentation

## 2019-11-29 NOTE — Progress Notes (Signed)
   Providing Compassionate, Quality Care - Together   Subjective: Exam performed with the aid of Delcie Roch, the bedside nurse, translating from [place]. Patient's uncle is at the bedside. Patient denies pain. He reports that he feels pretty good.  Objective: Vital signs in last 24 hours: Temp:  [97 F (36.1 C)-98.9 F (37.2 C)] 98.5 F (36.9 C) (09/23 0800) Pulse Rate:  [75-136] 106 (09/23 1000) Resp:  [8-16] 14 (09/23 1000) BP: (103-143)/(65-104) 121/78 (09/23 1000) SpO2:  [89 %-100 %] 97 % (09/23 1000) Weight:  [71.1 kg] 71.1 kg (09/22 1516)  Intake/Output from previous day: 09/22 0701 - 09/23 0700 In: 3035.8 [I.V.:2750.8; IV Piggyback:200] Out: 2110 [Urine:1800; Drains:160; Blood:150] Intake/Output this shift: Total I/O In: 325 [I.V.:225; IV Piggyback:100] Out: 20 [Drains:20]  Alert, oriented x 4 PERRLA Speech clear, delayed responses MAE, RUE slightly weaker than left Abdominal and right cranioplasty surgical sites covered with gauze, dressings are clean, dry, and intact Right epidural JP drain with 160 mL out overnight  Lab Results: No results for input(s): WBC, HGB, HCT, PLT in the last 72 hours. BMET No results for input(s): NA, K, CL, CO2, GLUCOSE, BUN, CREATININE, CALCIUM in the last 72 hours.  Studies/Results: No results found.  Assessment/Plan: Patient is one day status post right-sided cranioplasty by Dr. Arnoldo Morale. He is doing well following surgery. No seizure activity noted. Patient's wife in [place] was updated via telephone with the aid of Registered Nurse Delcie Roch translating.   LOS: 6 days    -Maintain epidural JP drain for one more day -PT, OT, and SLP ordered -Start to mobilize patient -Will transfer out of the ICU today   Viona Gilmore, DNP, AGNP-C Nurse Practitioner  [facility] [place]. [address], [address], [place], [place] [address] P: [phone]    F: [phone]  11/29/2019, 10:06 AM

## 2019-11-30 LAB — BASIC METABOLIC PANEL
Anion gap: 10 (ref 5–15)
BUN: 7 mg/dL (ref 6–20)
CO2: 23 mmol/L (ref 22–32)
Calcium: 8.9 mg/dL (ref 8.9–10.3)
Chloride: 104 mmol/L (ref 98–111)
Creatinine, Ser: 0.72 mg/dL (ref 0.61–1.24)
GFR calc Af Amer: >60 mL/min (ref >60)
GFR calc non Af Amer: >60 mL/min (ref >60)
Glucose, Bld: 109 mg/dL — ABNORMAL HIGH (ref 70–99)
Potassium: 3.4 mmol/L — ABNORMAL LOW (ref 3.5–5.1)
Sodium: 137 mmol/L (ref 135–145)

## 2019-11-30 MED ORDER — LEVETIRACETAM 500 MG PO TABS
500.0000 mg | ORAL_TABLET | Freq: Two times a day (BID) | ORAL | Status: DC
  Administered 2019-11-30 – 2019-12-04 (×9): 500 mg via ORAL
  Filled 2019-11-30 (×10): qty 1

## 2019-11-30 MED ORDER — PANTOPRAZOLE SODIUM 40 MG PO TBEC
40.0000 mg | DELAYED_RELEASE_TABLET | Freq: Every day | ORAL | Status: DC
  Administered 2019-11-30 – 2019-12-04 (×5): 40 mg via ORAL
  Filled 2019-11-30 (×6): qty 1

## 2019-11-30 NOTE — Evaluation (Signed)
Speech Language Pathology Evaluation Patient Details Name: Jason Skinner MRN: [id number] DOB: August 21, 1990 Today's Date: 11/30/2019 Time:  -     Problem List:  Patient Active Problem List   Diagnosis Date Noted  . Encephalopathy 11/24/2019  . 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 Superficial venous thrombosis of arm, unspecified laterality   . Vascular headache   . Acute blood loss anemia   . Thrombocytosis ([place])   . Urinary retention   . Postoperative pain   . Urinary incontinence   . Dysphagia   . Traumatic subdural hematoma ([place]) 09/20/2019  . SDH (subdural hematoma) ([place]) 09/05/2019   Past Medical History: History reviewed. No pertinent past medical history. Past Surgical History:  Past Surgical History:  Procedure Laterality Date  . CRANIOTOMY Right 09/05/2019   Procedure: CRANIOTOMY HEMATOMA EVACUATION SUBDURAL WITH  PLACEMENT OF BONE FLAP IN ABDOMEN.;  Surgeon: Newman Pies, MD;  Location: [facility];  Service: Neurosurgery;  Laterality: Right;  right  . CRANIOTOMY N/A 11/28/2019   Procedure: REMOVAL OF CRANIAL BONE FLAP FROM ABDOMEN WITH CRANIOTOMY FOR IMPLANTATION OF BONE FLAP;  Surgeon: Newman Pies, MD;  Location: [facility];  Service: Neurosurgery;  Laterality: N/A;   HPI:  Pt has a history of TBI with craniectomy due to fall from a ladder on 6/30. 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 He was previously ambulatory and able to communicate without difficulty. He presently is unable to ambulate due to significant left-sided weakness and only able to say 1-2 words at a time. He underwent cranioplasty on 11/28/19.  In prior admission he was found to have Right subdural hematoma, occipital skull fracture, clivus fracture, L1/2 R tranverse process fx, traumatic brain injury. Underwent  Right frontotemporoparietal craniectomy for evacuation of acute subdural  hematoma; insertion of right frontal ventriculostomy; insertion of craniotomy flap into the abdominal subcutaneous tissue.  Pt hospitalized 09/05/19 to 10/05/19 including CIR.  Pt has had one visit to OP Neurorehabilition on 8/18. The CLQT revealed severe executive function deficit, with memory WNL. SLP reported  memory is affected by attention and slow processing given that family report repetitive questions from the pt.    Assessment / Plan / Recommendation Clinical Impression  Pt demonstrates with a baseline cognitive impairment since TBI in June though currently his baseline impairments are exacerbated by increased weakness and fatigue. He is oriented x4 can follow complex verbal commands and attempt memory and reasoning tasks with moderate impairment identified. His affect is flat, he make little eye contact and does not smile or initiate questions or comments often. His family at bedside reports that since his injury he was able to complete most basic self care tasks other than bathing independently. He spent time on social media, and went for short trips shopping with physical assist. His short term memory has been impaired since the injury, but today, though he was attentive, pt struggled with working memory and short term memory to a more severe degree, impeding his ability complete a basic task. Pt may beneit from a second visit to CIR given acute decline so that he can at least function at supervision level after return home. Will visit pt acutely. When he returns home he will need to resume OP SLP therapy (only attended one session).     SLP Assessment  SLP Recommendation/Assessment: Patient needs continued Speech Lanaguage Pathology Services    Follow Up Recommendations  Inpatient Rehab    Frequency and Duration min 2x/week  2 weeks      SLP Evaluation Cognition  Overall Cognitive Status: Impaired/Different from baseline Arousal/Alertness: Awake/alert Orientation Level: Oriented  X4 Attention: Focused;Sustained;Selective Focused Attention: Appears intact Sustained Attention: Appears intact Selective Attention: Appears intact Memory: Impaired Memory Impairment: Retrieval deficit;Decreased short term memory Decreased Short Term Memory: Verbal basic Awareness: Impaired Awareness Impairment: Intellectual impairment Problem Solving: Impaired Problem Solving Impairment: Verbal basic Safety/Judgment: Appears intact       Comprehension  Auditory Comprehension Overall Auditory Comprehension: Appears within functional limits for tasks assessed Reading Comprehension Reading Status: Not tested    Expression Verbal Expression Overall Verbal Expression: Appears within functional limits for tasks assessed Pragmatics: Impairment Impairments: Eye contact;Abnormal affect;Monotone Written Expression Dominant Hand: Right   Oral / Motor  Oral Motor/Sensory Function Overall Oral Motor/Sensory Function: Within functional limits Motor Speech Overall Motor Speech: Appears within functional limits for tasks assessed   GO                   Herbie Baltimore, MA CCC-SLP  Acute Rehabilitation Services Pager [phone] Office [phone]  Lynann Beaver 11/30/2019, 12:54 PM

## 2019-11-30 NOTE — Evaluation (Signed)
Physical Therapy Evaluation Patient Details Name: Jason Skinner MRN: [id number] DOB: 12-16-1990 Today's Date: 11/30/2019   History of Present Illness  Pt is a 29 yo male who feel from ladder back in the summer and had TBI with craniectomy for subdural at that time.  Pt has done very well since but developed syndrome of trephined.  Pt to OR to replace crani flap that was in abdomen.  Pt now on floor with JP drain.  Clinical Impression   Pt presents with flat affect, mild difficulty mobilizing, decreased activity tolerance vs baseline, and moderate headache-type pain post-operatively. Pt to benefit from acute PT to address deficits. Pt tolerated transfer from recliner to bed only this session, limited by headache pain and fatigue. Pt's uncle in room, who pt lives with. PT anticipates pt will progress well with mobility, will be able to d/c home with assist of family. PT to progress mobility as tolerated, and will continue to follow acutely.      Follow Up Recommendations Supervision for mobility/OOB    Equipment Recommendations  None recommended by PT    Recommendations for Other Services       Precautions / Restrictions Precautions Precautions: Fall Restrictions Weight Bearing Restrictions: No      Mobility  Bed Mobility Overal bed mobility: Needs Assistance Bed Mobility: Sit to Supine     Supine to sit: Min assist Sit to supine: Min assist;HOB elevated   General bed mobility comments: Min assist for lifting LEs into bed, pt's uncle offering to help as needed.  Transfers Overall transfer level: Needs assistance Equipment used: 1 person hand held assist Transfers: Sit to/from [employer] Sit to Stand: Min assist Stand pivot transfers: Min assist       General transfer comment: Min assist to power up from recliner, steady, and guide back to bed. PT managing lines/leads, pt with little attention to them.  Ambulation/Gait                 Stairs            Wheelchair Mobility    Modified Rankin (Stroke Patients Only)       Balance Overall balance assessment: Needs assistance Sitting-balance support: Feet supported;Bilateral upper extremity supported Sitting balance-Leahy Scale: Good     Standing balance support: During functional activity;Single extremity supported Standing balance-Leahy Scale: Poor Standing balance comment: reliant on external assist                             Pertinent Vitals/Pain Pain Assessment: Faces Pain Score: 4  Faces Pain Scale: Hurts little more Pain Location: head Pain Descriptors / Indicators: Aching;Discomfort;Headache Pain Intervention(s): Limited activity within patient's tolerance;Monitored during session;Repositioned    Home Living Family/patient expects to be discharged to:: Private residence Living Arrangements: Other relatives Available Help at Discharge: Friend(s);Available 24 hours/day Type of Home: Mobile home Home Access: Stairs to enter Entrance Stairs-Rails: Chemical engineer of Steps: 4-5 Home Layout: One level   Additional Comments: 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 Brother is here.     Prior Function Level of Independence: Needs assistance   Gait / Transfers Assistance Needed: family member in room states pt was walking indoors without assist after last surgery  and before this surgery.  ADL's / Homemaking Assistance Needed: Pt could do own self care.  Had assist for home tasks.  Comments: wife and 2 daughters in [place]     Hand Dominance   Dominant Hand: Right    Extremity/Trunk Assessment   Upper Extremity Assessment Upper Extremity Assessment: Defer to OT evaluation    Lower Extremity Assessment Lower Extremity Assessment:  Overall WFL for tasks assessed    Cervical / Trunk Assessment Cervical / Trunk Assessment: Normal  Communication   Communication: Prefers language other than Cleophus Molt Laurita Quint [id number], [place])  Cognition Arousal/Alertness: Awake/alert Behavior During Therapy: Flat affect Overall Cognitive Status: Impaired/Different from baseline Area of Impairment: Attention;Following commands;Awareness;Problem solving                   Current Attention Level: Sustained   Following Commands: Follows one step commands consistently;Follows multi-step commands inconsistently   Awareness: Emergent Problem Solving: Slow processing;Difficulty sequencing;Requires verbal cues General Comments: Very flat affect, responds yes/no only. PT asked pt's uncle in room if this is pt baseline, and pt's uncle states yes. Pt oriented to location, year, self when tested.      General Comments General comments (skin integrity, edema, etc.): Pt very flat during assessment. Difficult to assess cognition but did answer all questions appropriately.    Exercises     Assessment/Plan    PT Assessment Patient needs continued PT services  PT Problem List Decreased strength;Decreased mobility;Decreased activity tolerance;Decreased balance;Decreased cognition;Pain;Decreased knowledge of precautions;Decreased safety awareness       PT Treatment Interventions DME instruction;Therapeutic activities;Gait training;Therapeutic exercise;Patient/family education;Balance training;Stair training;Functional mobility training;Neuromuscular re-education    PT Goals (Current goals can be found in the Care Plan section)  Acute Rehab PT Goals Patient Stated Goal: to get better PT Goal Formulation: With patient Time For Goal Achievement: 12/14/19 Potential to Achieve Goals: Good    Frequency Min 3X/week   Barriers to discharge        Co-evaluation               AM-PAC PT "6 Clicks" Mobility  Outcome Measure  Help needed turning from your back to your side while in a flat bed without using bedrails?: A Little Help needed moving from lying on your back to sitting on the side of a flat bed without using bedrails?: A Little Help needed moving to and from a bed to a chair (including a wheelchair)?: A Little Help needed standing up from a chair using your arms (e.g., wheelchair or bedside chair)?: A Little Help needed to walk in hospital room?: A Little Help needed climbing 3-5 steps with a railing? : A Lot 6 Click Score: 17    End of Session   Activity Tolerance: Patient limited by fatigue;Patient limited by pain Patient left: in bed;with call bell/phone within reach;with bed alarm set;with family/visitor present Nurse Communication: Mobility status (spoke to NT) PT Visit Diagnosis: Other abnormalities of gait and mobility (R26.89);Difficulty in walking, not elsewhere classified (R26.2)    Time: 7356-7014 PT Time Calculation (min) (ACUTE ONLY): 14 min   Charges:   PT Evaluation $PT Eval Low Complexity: 1 Low         Kaydance Bowie E, PT Acute Rehabilitation Services Pager [phone]  Office [phone]    Gar Glance D Chioke Noxon 11/30/2019, 1:15 PM

## 2019-11-30 NOTE — Evaluation (Signed)
Occupational Therapy Evaluation Patient Details Name: Jason Skinner MRN: [id number] DOB: Dec 17, 1990 Today's Date: 11/30/2019    History of Present Illness Pt is a 29 yo male who feel from ladder back in the summer and had TBI with craniectomy for subdural at that time.  Pt has done very well since but developed syndrome of trephined.  Pt to OR to replace crani flap that was in abdomen.  Pt now on floor with JP drain.   Clinical Impression   Pt admitted for the above diagnosis and has the deficits listed below. Pt would benefit from cont OT to increase independence with basic adls and adl transfers to mod I level so he can be safe returning home with his family and friends here in [place]. Pt will need 24 hour supervision upon d/c to ensure his safety with mobility and cognitive tasks after this most recent surgery.  Will continue to follow with focus on safety with adls and cognitive tasks.      Follow Up Recommendations  Home health OT;Supervision/Assistance - 24 hour    Equipment Recommendations  Other (comment) (tbd)    Recommendations for Other Services       Precautions / Restrictions Precautions Precautions: Fall Restrictions Weight Bearing Restrictions: No      Mobility Bed Mobility Overal bed mobility: Needs Assistance Bed Mobility: Supine to Sit     Supine to sit: Min assist     General bed mobility comments: HOB at 30 degrees. Assist to get all the way to side of bed.  Transfers Overall transfer level: Needs assistance   Transfers: Sit to/from Stand;Stand Pivot Transfers Sit to Stand: Min assist Stand pivot transfers: Mod assist       General transfer comment: hand held assist.  Unsteady on feet.    Balance Overall balance assessment: Needs assistance Sitting-balance support: Feet supported;Bilateral upper extremity supported Sitting balance-Leahy Scale: Good     Standing balance support: Bilateral upper extremity supported;During  functional activity Standing balance-Leahy Scale: Poor Standing balance comment: Pt required outside assist to stand.                           ADL either performed or assessed with clinical judgement   ADL Overall ADL's : Needs assistance/impaired Eating/Feeding: Minimal assistance;Sitting   Grooming: Oral care;Wash/dry face;Wash/dry hands;Set up;Sitting   Upper Body Bathing: Minimal assistance;Sitting   Lower Body Bathing: Moderate assistance;Sit to/from stand;Cueing for compensatory techniques   Upper Body Dressing : Minimal assistance;Sitting   Lower Body Dressing: Moderate assistance;Sit to/from stand;Cueing for compensatory techniques   Toilet Transfer: Minimal assistance;BSC;Stand-pivot;Cueing for sequencing   Toileting- Clothing Manipulation and Hygiene: Moderate assistance;Sit to/from stand;Cueing for compensatory techniques       Functional mobility during ADLs: Moderate assistance General ADL Comments: Pt slow to process today and very flat affect. Pt mildly insteady on his feet when up affecting safety during adls.       Vision Patient Visual Report: No change from baseline Vision Assessment?: Vision impaired- to be further tested in functional context Additional Comments: Feel vision needs a closer look when pt more verbal.  pt could locate items in all hemispheres, could read clock and large print on wall.  Pt could not track quickly back and forth and had tendency to stare when not engaged in activity.     Perception Perception Perception Tested?: No   Praxis Praxis Praxis tested?: Not tested    Pertinent Vitals/Pain Pain Assessment: 0-10 Pain  Score: 5  Pain Location: head Pain Descriptors / Indicators: Aching;Discomfort Pain Intervention(s): Limited activity within patient's tolerance;Premedicated before session;Monitored during session;Repositioned;Patient requesting pain meds-RN notified     Hand Dominance Right   Extremity/Trunk  Assessment Upper Extremity Assessment Upper Extremity Assessment: Overall WFL for tasks assessed   Lower Extremity Assessment Lower Extremity Assessment: Defer to PT evaluation   Cervical / Trunk Assessment Cervical / Trunk Assessment: Normal   Communication Communication Communication: Prefers language other than English   Cognition Arousal/Alertness: Awake/alert Behavior During Therapy: Flat affect Overall Cognitive Status: Impaired/Different from baseline Area of Impairment: Attention;Following commands;Awareness;Problem solving                   Current Attention Level: Sustained   Following Commands: Follows one step commands consistently;Follows multi-step commands inconsistently   Awareness: Emergent Problem Solving: Slow processing;Difficulty sequencing;Requires verbal cues General Comments: Difficult to assess due to language barrier.  Did have interpreter but pt very quiet.     General Comments  Pt very flat during assessment. Difficult to assess cognition but did answer all questions appropriately.    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 Brother is here.       Prior Functioning/Environment Level of Independence: Needs assistance  Gait / Transfers  Assistance Needed: family member in room states pt was walking indoors without assist after last surgery and before this surgery. ADL's / Homemaking Assistance Needed: Pt could do own self care.  Had assist for home tasks.   Comments: wife and 2 daughters in [place]        OT Problem List: Impaired balance (sitting and/or standing);Decreased cognition;Decreased safety awareness;Decreased knowledge of use of DME or AE;Decreased knowledge of precautions;Pain      OT Treatment/Interventions: Self-care/ADL training;Cognitive remediation/compensation;DME and/or AE instruction;Therapeutic activities    OT Goals(Current goals can be found in the care plan section) Acute Rehab OT Goals Patient Stated Goal: to get better OT Goal Formulation: With patient Time For Goal Achievement: 12/14/19 Potential to Achieve Goals: Good ADL Goals Pt Will Perform Grooming: with supervision;standing Pt Will Perform Lower Body Bathing: with modified independence;sit to/from stand Pt Will Perform Lower Body Dressing: with modified independence;sit to/from stand Additional ADL Goal #1: Pt will walk to bathroom and complete all toileting tasks with mod I. Additional ADL Goal #2: pt will complete full bathing and dressing adll in room gathering all clothes with mod I.  OT Frequency: Min 3X/week   Barriers to D/C:    family member in room states someone is usually with him.       Co-evaluation              AM-PAC OT "6 Clicks" Daily Activity     Outcome Measure Help from another person eating meals?: A Little Help from another person taking  care of personal grooming?: A Little Help from another person toileting, which includes using toliet, bedpan, or urinal?: A Little Help from another person bathing (including washing, rinsing, drying)?: A Little Help from another person to put on and taking off regular upper body clothing?: A Little Help from another person to put on and taking off regular lower body  clothing?: A Lot 6 Click Score: 17   End of Session Nurse Communication: Mobility status  Activity Tolerance: Patient tolerated treatment well Patient left: in chair;with call bell/phone within reach;with chair alarm set;with family/visitor present  OT Visit Diagnosis: Unsteadiness on feet (R26.81);Other symptoms and signs involving cognitive function                Time: 0930-1005 OT Time Calculation (min): 35 min Charges:  OT General Charges $OT Visit: 1 Visit OT Evaluation $OT Eval Moderate Complexity: 1 Mod OT Treatments $Self Care/Home Management : 8-22 mins  Glenford Peers 11/30/2019, 10:31 AM

## 2019-11-30 NOTE — Plan of Care (Signed)

## 2019-11-30 NOTE — Progress Notes (Signed)
   Providing Compassionate, Quality Care - Together   Subjective: Patient reports he is doing good.  Objective: Vital signs in last 24 hours: Temp:  [98.2 F (36.8 C)-99 F (37.2 C)] 98.6 F (37 C) (09/24 0806) Pulse Rate:  [84-109] 97 (09/24 0806) Resp:  [11-18] 18 (09/24 0806) BP: (101-138)/(62-88) 138/88 (09/24 0806) SpO2:  [96 %-100 %] 100 % (09/24 0806)  Intake/Output from previous day: 09/23 0701 - 09/24 0700 In: 2140 [P.O.:240; I.V.:1800; IV Piggyback:100] Out: 2770 [Urine:2700; Drains:70] Intake/Output this shift: Total I/O In: 240 [P.O.:240] Out: 400 [Urine:400]  Alert, oriented x 4 PERRLA Speech clear, delayed responses MAE, RUE slightly weaker than left Abdominal and right cranioplasty surgical sites covered with gauze, dressings are clean, dry, and intact Right epidural JP drain with 50 mL out overnight   Lab Results: No results for input(s): WBC, HGB, HCT, PLT in the last 72 hours. BMET Recent Labs    11/30/19 0249  NA 137  K 3.4*  CL 104  CO2 23  GLUCOSE 109*  BUN 7  CREATININE 0.72  CALCIUM 8.9    Studies/Results: No results found.  Assessment/Plan: Patient is two days status post right-sided cranioplasty by Dr. Arnoldo Morale. He is doing well following surgery. No seizure activity noted.    LOS: 7 days    -Remove JP drain today -PT, OT, and SLP to evaluate patient today   Viona Gilmore, DNP, AGNP-C Nurse Practitioner  [facility]. [address], [address], [place], [place] [address] P: [phone]    F: [phone]  11/30/2019, 10:29 AM

## 2019-12-01 NOTE — TOC Initial Note (Addendum)
Transition of Care [facility]) - Initial/Assessment Note    Patient Details  Name: Jason Skinner MRN: [id number] Date of Birth: 09-03-1990  Transition of Care [facility]) CM/SW Contact:    Pollie Friar, RN Phone Number: 12/01/2019, 12:29 PM  Clinical Narrative:                 CM met with the patient and his uncle at the bedside using the interpreter services.  Barbaraann Rondo is concerned about him just having surgery on Thursday and returning home in his weak condition. Pt was apparently walking independently prior to this admission. Per PT note they did a transfer with him but no ambulation. CM has asked the assigned OT today to see what he might they feel he will need at d/c.  CM has reached out to his adjuster through Workers Comp and left a Designer, television/film set. Workers Comp is most likely closed until Monday.  CM left voicemail for neurosurgeon on call.   1635: OT now also recommending CIR. Will need order for eval. CM will also reach out to patients workers comp on Monday to see what they are willing to cover.   Expected Discharge Plan: [place] Barriers to Discharge: Inadequate or no insurance   Patient Goals and CMS Choice     Choice offered to / list presented to : Sibling  Expected Discharge Plan and Services Expected Discharge Plan: [place]   Discharge Planning Services: CM Consult   Living arrangements for the past 2 months: Single Family Home                                      Prior Living Arrangements/Services Living arrangements for the past 2 months: Single Family Home Lives with:: Siblings Patient language and need for interpreter reviewed:: Yes Do you feel safe going back to the place where you live?: Yes      Need for Family Participation in Patient Care: Yes (Comment) Care giver support system in place?:  (family working to provide needed supervision)   Criminal Activity/Legal Involvement Pertinent to Current  Situation/Hospitalization: No - Comment as needed  Activities of Daily Living      Permission Sought/Granted                  Emotional Assessment Appearance:: Appears stated age     Orientation: : Oriented to Self   Psych Involvement: No (comment)  Admission diagnosis:  Leukocytosis [D72.829] Weakness [R53.1] Altered mental status, unspecified altered mental status type [R41.82] Traumatic brain injury, with loss of consciousness of 30 minutes or less, subsequent encounter [S06.9X1D] Status post craniectomy [Z98.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 Encephalopathy 11/24/2019  . 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 Superficial venous thrombosis of arm, unspecified laterality   . Vascular headache   . Acute blood loss anemia   . Thrombocytosis ([place])   . Urinary retention   . Postoperative pain   . Urinary incontinence   . Dysphagia   . 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 SDH (subdural hematoma) ([place]) 09/05/2019   PCP:  Patient, No Pcp Per Pharmacy:   [facility], [address] [place] [address] Phone: [phone] Fax: [phone]  [facility] [place], [place]  [address] [place] [address] Phone: [phone] Fax: [phone]     Social Determinants of Health (SDOH) Interventions    Readmission Risk Interventions No flowsheet data found.  

## 2019-12-01 NOTE — Progress Notes (Signed)
Occupational Therapy Treatment Patient Details Name: Jason Skinner MRN: [id number] DOB: [birth year] Today's Date: 12/01/2019    History of present illness Pt is a 29 yo male who feel from ladder back in the summer and had TBI with craniectomy for subdural at that time.  Pt has done very well since but developed syndrome of trephined.  Pt to OR to replace crani flap that was in abdomen.  Pt now on floor with JP drain.   OT comments  Upon entering the room, pt supine in bed and agreeable to OT intervention. Pt given socks and he donned while supine in bed with set up A. Pt performing supine >sit with min A to EOB. Pt is agreeable to functional mobility this session. Pt standing with min A and then ambulating 20', 75', and 61' with mod hand held assistance. Pt with one significant LOB to the R requiring max A to correct. Pt taking seated rest break after each bout of ambulation and asking therapist to "walk again" each time. Pt's HR increased to 116 bpm with mobility and O2 saturation above 97% throughout. Pt following commands with increased time. He still shows impulsivity but also verbalizing insight to deficits of balance and strength this session. Pt seated to wash face and hands with set up A and then requesting to rest. Pt returning to bed with min A and OT assisted with repositioning for comfort. OT changing recommendation to CIR for intensive therapies before returning home. Pt will continue to benefit from acute OT intervention.  Follow Up Recommendations  CIR;Supervision/Assistance - 24 hour    Equipment Recommendations  Other (comment) (defer to next venue of care)       Precautions / Restrictions Precautions Precautions: Fall       Mobility Bed Mobility Overal bed mobility: Needs Assistance Bed Mobility: Sit to Supine     Supine to sit: Min assist Sit to supine: Min assist;HOB elevated   General bed mobility comments: min A for safety and trunk  support  Transfers Overall transfer level: Needs assistance Equipment used: 1 person hand held assist Transfers: Sit to/from [facility] Sit to Stand: Min assist Stand pivot transfers: Min assist       General transfer comment: min A to power up into standing from EOB.    Balance Overall balance assessment: Needs assistance Sitting-balance support: Feet supported;Bilateral upper extremity supported Sitting balance-Leahy Scale: Good Sitting balance - Comments: Pt is impulsive.   Standing balance support: During functional activity;Single extremity supported Standing balance-Leahy Scale: Poor Standing balance comment: reliant on external assist             ADL either performed or assessed with clinical judgement   ADL Overall ADL's : Needs assistance/impaired   General ADL Comments: Pt still with flat affect but enjoys talking about fast and furious movies! He dons B socks from bed level with set up A.               Cognition Arousal/Alertness: Awake/alert Behavior During Therapy: Flat affect Overall Cognitive Status: Impaired/Different from baseline Area of Impairment: Attention;Following commands;Awareness;Problem solving      Current Attention Level: Sustained   Following Commands: Follows one step commands consistently;Follows multi-step commands inconsistently   Awareness: Emergent Problem Solving: Slow processing;Difficulty sequencing;Requires verbal cues General Comments: Flat affect. Engaging more when asked about things he enjoys doing at home. Pt oriented Ox4 this session.  Pertinent Vitals/ Pain       Pain Assessment: Faces Faces Pain Scale: Hurts a little bit Pain Location: head Pain Descriptors / Indicators: Aching;Discomfort;Headache Pain Intervention(s): Limited activity within patient's tolerance;Monitored during session;Repositioned  Home Living Family/patient expects to be discharged to:: Private  residence Living Arrangements: Other relatives Available Help at Discharge: Friend(s);Available 24 hours/day               Frequency  Min 3X/week        Progress Toward Goals  OT Goals(current goals can now be found in the care plan section)  Progress towards OT goals: Progressing toward goals  Acute Rehab OT Goals Patient Stated Goal: to get better OT Goal Formulation: With patient Time For Goal Achievement: 12/14/19 Potential to Achieve Goals: Good  Plan Discharge plan needs to be updated       AM-PAC OT "6 Clicks" Daily Activity     Outcome Measure   Help from another person eating meals?: A Little Help from another person taking care of personal grooming?: A Little Help from another person toileting, which includes using toliet, bedpan, or urinal?: A Little Help from another person bathing (including washing, rinsing, drying)?: A Little Help from another person to put on and taking off regular upper body clothing?: A Little Help from another person to put on and taking off regular lower body clothing?: A Little 6 Click Score: 18    End of Session    OT Visit Diagnosis: Unsteadiness on feet (R26.81);Other symptoms and signs involving cognitive function   Activity Tolerance Patient tolerated treatment well   Patient Left with call bell/phone within reach;in bed;with bed alarm set   Nurse Communication Mobility status        Time: 6160-7371 OT Time Calculation (min): 29 min  Charges: OT General Charges $OT Visit: 1 Visit OT Treatments $Self Care/Home Management : 8-22 mins $Therapeutic Activity: 8-22 mins  Darleen Crocker, MS, OTR/L , CBIS 12/01/19, 2:43 PM

## 2019-12-01 NOTE — Progress Notes (Signed)
   Providing Compassionate, Quality Care - Together  NEUROSURGERY PROGRESS NOTE   S: No issues overnight.  O: EXAM:  BP 123/81 (BP Location: Right Arm)   Pulse 92   Temp 98 F (36.7 C) (Oral)   Resp 16   Ht 5\' 7"  (1.702 m)   Wt 71.1 kg   SpO2 100%   BMI 24.55 kg/m   Awake, alert PERRL Tracks around room Speech slow, language barrier, answers basic y/n questions CNs grossly intact  MAE symmetrically off bed w coaching Incision c/d/i  ASSESSMENT:  29 y.o. male with  Status post right cranioplasty  PLAN: -Cleared by PT/OT for home with family -Attempted multiple times to contact uncle who is his caregiver with no answer -Continue supportive measures -Wound healing appropriately    Thank you for allowing me to participate in this patient's care.  Please do not hesitate to call with questions or concerns.   Elwin Sleight, [facility] Cell: [phone] \

## 2019-12-02 MED ORDER — ENOXAPARIN SODIUM 40 MG/0.4ML ~~LOC~~ SOLN
40.0000 mg | SUBCUTANEOUS | Status: DC
  Administered 2019-12-02 – 2019-12-04 (×3): 40 mg via SUBCUTANEOUS
  Filled 2019-12-02 (×3): qty 0.4

## 2019-12-02 NOTE — Progress Notes (Signed)
Subjective: NAEs o/n  Objective: Vital signs in last 24 hours: Temp:  [98 F (36.7 C)-98.6 F (37 C)] 98.6 F (37 C) (09/26 0915) Pulse Rate:  [92-100] 100 (09/26 0915) Resp:  [16-17] 16 (09/26 0915) BP: (105-129)/(78-88) 108/81 (09/26 0915) SpO2:  [97 %-100 %] 100 % (09/26 0915)  Intake/Output from previous day: 09/25 0701 - 09/26 0700 In: 480 [P.O.:480] Out: 3800 [Urine:3800] Intake/Output this shift: Total I/O In: 480 [P.O.:480] Out: -   Eyes open spont, PERRL. Incision c/d MAEs  Lab Results: No results for input(s): WBC, HGB, HCT, PLT in the last 72 hours. BMET Recent Labs    11/30/19 0249  NA 137  K 3.4*  CL 104  CO2 23  GLUCOSE 109*  BUN 7  CREATININE 0.72  CALCIUM 8.9    Studies/Results: No results found.  Assessment/Plan: S/p cranioplasty - family prefers d/c to rehab as they feel unprepared to care for him - per SW, patient has workman's comp and Monday will determine rehab benefits   Jason Skinner 12/02/2019, 11:50 AM

## 2019-12-03 NOTE — Progress Notes (Signed)
Physical Therapy Treatment Patient Details Name: Jason Skinner MRN: [id number] DOB: Aug 14, 1990 Today's Date: 12/03/2019    History of Present Illness Pt is a 29 yo male who feel from ladder back in the summer and had TBI with craniectomy for subdural at that time.  Pt has done very well since but developed syndrome of trephined.  Pt to OR to replace crani flap that was in abdomen.  Pt now on floor with JP drain.    PT Comments    Pt alert and asking to use the bathroom upon PT arrival. Pt became tachycardic with transfers so plan to ambulate into bathroom was changed to a SPT to/from [facility] at bedside. Max HR I saw was 159 bpm. Pt required assist for balance during transfers as well as for effective peri-care. Discussed plan for d/c with pt. He is oriented x4 but with poor insight to current deficits, and likely a decline in cognition and functional status. Feel this patient would benefit from a short stay at CIR to decrease caregiver burden, as well as maximize functional independence, safety, and return to PLOF.      Follow Up Recommendations  CIR;Supervision/Assistance - 24 hour     Equipment Recommendations  None recommended by PT    Recommendations for Other Services Rehab consult     Precautions / Restrictions Precautions Precautions: Fall Precaution Comments: flat affect, poor insight  Restrictions Weight Bearing Restrictions: No    Mobility  Bed Mobility Overal bed mobility: Needs Assistance Bed Mobility: Supine to Sit;Sit to Supine     Supine to sit: Min guard Sit to supine: Min guard   General bed mobility comments: Pt reaching out for therapist's hand to pull up into sitting. Redirected him to use the railing for support and pt was able to transition to full sitting position without assist. Pt required assist from therapist more for management of lines as pt with no attention to lines and getting feet tangled up in condom cath line as well as leads to telemetry  box  Transfers Overall transfer level: Needs assistance Equipment used: 1 person hand held assist Transfers: Sit to/from Stand;Stand Pivot Transfers Sit to Stand: Min assist Stand pivot transfers: Min assist       General transfer comment: Assist for balance support and safety with power-up to full stand and transition to/from [facility] at bedside.   Ambulation/Gait             General Gait Details: Not able to progress ambulation at this time due to HR   Stairs             Wheelchair Mobility    Modified Rankin (Stroke Patients Only)       Balance Overall balance assessment: Needs assistance Sitting-balance support: Feet supported;Bilateral upper extremity supported Sitting balance-Leahy Scale: Good     Standing balance support: During functional activity;Single extremity supported Standing balance-Leahy Scale: Poor Standing balance comment: reliant on external assist                            Cognition Arousal/Alertness: Awake/alert Behavior During Therapy: Flat affect;Impulsive Overall Cognitive Status: Impaired/Different from baseline Area of Impairment: Attention;Following commands;Awareness;Problem solving;Safety/judgement                   Current Attention Level: Sustained   Following Commands: Follows one step commands consistently;Follows multi-step commands inconsistently Safety/Judgement: Decreased awareness of safety;Decreased awareness of deficits Awareness: Emergent Problem Solving: Slow processing;Difficulty  sequencing;Requires verbal cues General Comments: Oriented, however poor insight to his current situation and level of function.       Exercises      General Comments        Pertinent Vitals/Pain Pain Assessment: Faces Faces Pain Scale: Hurts a little bit Pain Location: head Pain Descriptors / Indicators: Headache;Operative site guarding Pain Intervention(s): Monitored during session    Home Living                       Prior Function            PT Goals (current goals can now be found in the care plan section) Acute Rehab PT Goals Patient Stated Goal: To go home PT Goal Formulation: With patient Time For Goal Achievement: 12/14/19 Potential to Achieve Goals: Good Progress towards PT goals: Progressing toward goals    Frequency    Min 3X/week      PT Plan Discharge plan needs to be updated    Co-evaluation              AM-PAC PT "6 Clicks" Mobility   Outcome Measure  Help needed turning from your back to your side while in a flat bed without using bedrails?: A Little Help needed moving from lying on your back to sitting on the side of a flat bed without using bedrails?: A Little Help needed moving to and from a bed to a chair (including a wheelchair)?: A Little Help needed standing up from a chair using your arms (e.g., wheelchair or bedside chair)?: A Little Help needed to walk in hospital room?: A Little Help needed climbing 3-5 steps with a railing? : A Lot 6 Click Score: 17    End of Session Equipment Utilized During Treatment: Gait belt Activity Tolerance: Treatment limited secondary to medical complications (Comment) (Tachycardia) Patient left: in bed;with call bell/phone within reach;with bed alarm set (Pt pulling his 4th bedrail up. Left up at his request. ) Nurse Communication: Mobility status;Other (comment) (Notified of HR during mobility) PT Visit Diagnosis: Other abnormalities of gait and mobility (R26.89);Difficulty in walking, not elsewhere classified (R26.2)     Time: 6270-3500 PT Time Calculation (min) (ACUTE ONLY): 19 min  Charges:  $Therapeutic Activity: 8-22 mins                     Rolinda Roan, PT, DPT Acute Rehabilitation Services Pager: [phone] Office: [phone]    Jason Skinner 12/03/2019, 9:30 AM

## 2019-12-03 NOTE — TOC Progression Note (Signed)
Transition of Care [facility]) - Progression Note    Patient Details  Name: Jason Skinner MRN: [id number] Date of Birth: 07/24/1990  Transition of [facility]) CM/SW Contact  Pollie Friar, RN Phone Number: 12/03/2019, 12:00 PM  Clinical Narrative:    Recommendations are for CIR. CM has reached out to patients Workers Comp (travelers) on the new recommendations. CM has faxed (with pt permission) the information needed for the claims department to review [phone]).  Per the Case Manager through [place] they dont need to approve the stay.  CM updated CIR representative.  TOC following.   Expected Discharge Plan: [place] Barriers to Discharge: Inadequate or no insurance  Expected Discharge Plan and Services Expected Discharge Plan: [place]   Discharge Planning Services: CM Consult   Living arrangements for the past 2 months: Single Family Home                                       Social Determinants of Health (SDOH) Interventions    Readmission Risk Interventions No flowsheet data found.

## 2019-12-03 NOTE — Progress Notes (Signed)
Inpatient Rehab Admissions Coordinator Note:   Per OT/ST recommendations, pt was screened for CIR candidacy by Gayland Curry, MS, CCC-SLP.  At this time we are recommending an Inpatient Rehab consult.  AC will contact attending to request consult order.  Please contact me with questions.   Gayland Curry, [place], [place] Admissions Coordinator [phone] 12/03/19 9:38 AM

## 2019-12-03 NOTE — Progress Notes (Signed)
   Providing Compassionate, Quality Care - Together   Subjective: Patient reports no issues overnight.  Objective: Vital signs in last 24 hours: Temp:  [97.6 F (36.4 C)-99.5 F (37.5 C)] 98 F (36.7 C) (09/27 0819) Pulse Rate:  [98-119] 119 (09/27 0819) Resp:  [16-20] 17 (09/27 0819) BP: (97-121)/(66-90) 97/72 (09/27 0819) SpO2:  [99 %-100 %] 100 % (09/27 0819)  Intake/Output from previous day: 09/26 0701 - 09/27 0700 In: 480 [P.O.:480] Out: 3550 [Urine:3550] Intake/Output this shift: Total I/O In: 240 [P.O.:240] Out: 300 [Urine:300]  Alert, oriented x 4 PERRLA Speech clear, delayed responses MAE, RUE slightly weaker than left Abdominal site OTA, closed with staples, site is clean, dry, and intact Right cranioplasty surgical site covered with gauze, dressing is clean, dry, and intact  Lab Results: No results for input(s): WBC, HGB, HCT, PLT in the last 72 hours. BMET No results for input(s): NA, K, CL, CO2, GLUCOSE, BUN, CREATININE, CALCIUM in the last 72 hours.  Studies/Results: No results found.  Assessment/Plan: Patient is five days status post right-sided cranioplasty by Dr. Arnoldo Morale. He is doing well following surgery. No seizure activity noted. Family would like for patient to discharge to CIR before going home.   LOS: 10 days    -No new Neurosurgical recommendations at this time -Awaiting decision from workers comp -Continue to mobilize   Viona Gilmore, [place], AGNP-C Nurse Practitioner  [facility] [name] [place], [place], [place] [address] P: [phone]    F: [phone]  12/03/2019, 8:55 AM

## 2019-12-03 NOTE — Progress Notes (Signed)
OT Cancellation Note  Patient Details Name: Jason Skinner MRN: [id number] DOB: September 06, 1990   Cancelled Treatment:    Reason Eval/Treat Not Completed: Medical issues which prohibited therapy Pt's HR is currently 120-130s at rest. Per RN, MD has been consulted. Will check back at able in order to progress with therapy safely.   Corinne Ports E. Saina Waage, COTA/L Acute Rehabilitation Services [phone] [place] 12/03/2019, 9:56 AM

## 2019-12-04 ENCOUNTER — Encounter (HOSPITAL_COMMUNITY): Payer: Self-pay | Admitting: Physical Medicine & Rehabilitation

## 2019-12-04 ENCOUNTER — Inpatient Hospital Stay (HOSPITAL_COMMUNITY)
Admission: RE | Admit: 2019-12-04 | Discharge: 2019-12-17 | DRG: 945 | Disposition: A | Discharge status: Home / Self Care | Payer: Self-pay | Source: Intra-hospital | Attending: Physical Medicine & Rehabilitation | Admitting: Physical Medicine & Rehabilitation

## 2019-12-04 ENCOUNTER — Other Ambulatory Visit: Payer: Self-pay

## 2019-12-04 DIAGNOSIS — G4700 Insomnia, unspecified: Secondary | ICD-10-CM | POA: Diagnosis present

## 2019-12-04 DIAGNOSIS — R4182 Altered mental status, unspecified: Secondary | ICD-10-CM | POA: Diagnosis present

## 2019-12-04 DIAGNOSIS — E871 Hypo-osmolality and hyponatremia: Secondary | ICD-10-CM | POA: Diagnosis present

## 2019-12-04 DIAGNOSIS — S069XAA Unspecified intracranial injury with loss of consciousness status unknown, initial encounter: Secondary | ICD-10-CM | POA: Diagnosis present

## 2019-12-04 DIAGNOSIS — S065X9D Traumatic subdural hemorrhage with loss of consciousness of unspecified duration, subsequent encounter: Principal | ICD-10-CM

## 2019-12-04 DIAGNOSIS — K5900 Constipation, unspecified: Secondary | ICD-10-CM | POA: Diagnosis present

## 2019-12-04 DIAGNOSIS — D72829 Elevated white blood cell count, unspecified: Secondary | ICD-10-CM | POA: Diagnosis present

## 2019-12-04 DIAGNOSIS — G9340 Encephalopathy, unspecified: Secondary | ICD-10-CM

## 2019-12-04 DIAGNOSIS — M6281 Muscle weakness (generalized): Secondary | ICD-10-CM | POA: Diagnosis present

## 2019-12-04 DIAGNOSIS — W11XXXS Fall on and from ladder, sequela: Secondary | ICD-10-CM | POA: Diagnosis present

## 2019-12-04 DIAGNOSIS — R339 Retention of urine, unspecified: Secondary | ICD-10-CM | POA: Diagnosis present

## 2019-12-04 DIAGNOSIS — E876 Hypokalemia: Secondary | ICD-10-CM | POA: Diagnosis present

## 2019-12-04 DIAGNOSIS — R5381 Other malaise: Secondary | ICD-10-CM | POA: Diagnosis present

## 2019-12-04 DIAGNOSIS — Z9889 Other specified postprocedural states: Secondary | ICD-10-CM

## 2019-12-04 DIAGNOSIS — S069X0A Unspecified intracranial injury without loss of consciousness, initial encounter: Secondary | ICD-10-CM

## 2019-12-04 DIAGNOSIS — S069X9A Unspecified intracranial injury with loss of consciousness of unspecified duration, initial encounter: Secondary | ICD-10-CM | POA: Diagnosis present

## 2019-12-04 DIAGNOSIS — R519 Headache, unspecified: Secondary | ICD-10-CM | POA: Diagnosis present

## 2019-12-04 LAB — CREATININE, SERUM
Creatinine, Ser: 0.77 mg/dL (ref 0.61–1.24)
GFR calc Af Amer: >60 mL/min (ref >60)
GFR calc non Af Amer: >60 mL/min (ref >60)

## 2019-12-04 LAB — CBC
HCT: 38.7 % — ABNORMAL LOW (ref 39.0–52.0)
Hemoglobin: 13.5 g/dL (ref 13.0–17.0)
MCH: 30.3 pg (ref 26.0–34.0)
MCHC: 34.9 g/dL (ref 30.0–36.0)
MCV: 87 fL (ref 80.0–100.0)
Platelets: 582 10*3/uL — ABNORMAL HIGH (ref 150–400)
RBC: 4.45 MIL/uL (ref 4.22–5.81)
RDW: 13.1 % (ref 11.5–15.5)
WBC: 12.5 10*3/uL — ABNORMAL HIGH (ref 4.0–10.5)
nRBC: 0 % (ref 0.0–0.2)

## 2019-12-04 MED ORDER — LEVETIRACETAM 500 MG PO TABS: 500.0000 mg | ORAL_TABLET | Freq: Two times a day (BID) | ORAL | 1 refills | Status: DC

## 2019-12-04 MED ORDER — BISACODYL 5 MG PO TBEC: 5.0000 mg | DELAYED_RELEASE_TABLET | Freq: Every day | ORAL | Status: DC | PRN

## 2019-12-04 MED ORDER — ONDANSETRON HCL 4 MG/2ML IJ SOLN: 4.0000 mg | INTRAMUSCULAR | Status: DC | PRN

## 2019-12-04 MED ORDER — ACETAMINOPHEN 325 MG PO TABS: 650.0000 mg | ORAL_TABLET | ORAL | Status: DC | PRN

## 2019-12-04 MED ORDER — METHOCARBAMOL 500 MG PO TABS
500.0000 mg | ORAL_TABLET | Freq: Three times a day (TID) | ORAL | Status: DC | PRN
  Administered 2019-12-07: 500 mg via ORAL
  Filled 2019-12-04: qty 1

## 2019-12-04 MED ORDER — ENOXAPARIN SODIUM 40 MG/0.4ML ~~LOC~~ SOLN
40.0000 mg | SUBCUTANEOUS | Status: DC
  Administered 2019-12-05 – 2019-12-08 (×4): 40 mg via SUBCUTANEOUS
  Filled 2019-12-04 (×4): qty 0.4

## 2019-12-04 MED ORDER — ACETAMINOPHEN 650 MG RE SUPP: 650.0000 mg | RECTAL | Status: DC | PRN

## 2019-12-04 MED ORDER — POLYETHYLENE GLYCOL 3350 17 G PO PACK
17.0000 g | PACK | Freq: Every day | ORAL | Status: DC
  Administered 2019-12-05 – 2019-12-17 (×13): 17 g via ORAL
  Filled 2019-12-04 (×13): qty 1

## 2019-12-04 MED ORDER — ENOXAPARIN SODIUM 40 MG/0.4ML ~~LOC~~ SOLN: 40.0000 mg | SUBCUTANEOUS | Status: DC

## 2019-12-04 MED ORDER — MECLIZINE HCL 25 MG PO TABS: 12.5000 mg | ORAL_TABLET | Freq: Three times a day (TID) | ORAL | Status: DC | PRN

## 2019-12-04 MED ORDER — DOCUSATE SODIUM 100 MG PO CAPS
100.0000 mg | ORAL_CAPSULE | Freq: Two times a day (BID) | ORAL | Status: DC
  Administered 2019-12-04 – 2019-12-07 (×6): 100 mg via ORAL
  Filled 2019-12-04 (×6): qty 1

## 2019-12-04 MED ORDER — ONDANSETRON HCL 4 MG PO TABS: 4.0000 mg | ORAL_TABLET | ORAL | Status: DC | PRN

## 2019-12-04 MED ORDER — HYDROCODONE-ACETAMINOPHEN 5-325 MG PO TABS
1.0000 | ORAL_TABLET | ORAL | Status: DC | PRN
  Administered 2019-12-05 – 2019-12-07 (×3): 1 via ORAL
  Filled 2019-12-04 (×3): qty 1

## 2019-12-04 MED ORDER — TOPIRAMATE 25 MG PO TABS
75.0000 mg | ORAL_TABLET | Freq: Two times a day (BID) | ORAL | Status: DC
  Administered 2019-12-04 – 2019-12-17 (×26): 75 mg via ORAL
  Filled 2019-12-04 (×26): qty 3

## 2019-12-04 MED ORDER — BETHANECHOL CHLORIDE 10 MG PO TABS
10.0000 mg | ORAL_TABLET | Freq: Three times a day (TID) | ORAL | Status: DC
  Administered 2019-12-04 – 2019-12-07 (×8): 10 mg via ORAL
  Filled 2019-12-04 (×8): qty 1

## 2019-12-04 MED ORDER — LEVETIRACETAM 500 MG PO TABS
500.0000 mg | ORAL_TABLET | Freq: Two times a day (BID) | ORAL | Status: DC
  Administered 2019-12-05 – 2019-12-17 (×25): 500 mg via ORAL
  Filled 2019-12-04 (×25): qty 1

## 2019-12-04 MED ORDER — PANTOPRAZOLE SODIUM 40 MG PO TBEC
40.0000 mg | DELAYED_RELEASE_TABLET | Freq: Every day | ORAL | Status: DC
  Administered 2019-12-05 – 2019-12-17 (×13): 40 mg via ORAL
  Filled 2019-12-04 (×13): qty 1

## 2019-12-04 MED ORDER — DIPHENHYDRAMINE HCL 25 MG PO CAPS
50.0000 mg | ORAL_CAPSULE | Freq: Every evening | ORAL | Status: DC | PRN
  Administered 2019-12-04: 50 mg via ORAL
  Filled 2019-12-04: qty 2

## 2019-12-04 MED ORDER — HYDROCODONE-ACETAMINOPHEN 5-325 MG PO TABS
1.0000 | ORAL_TABLET | ORAL | 0 refills | Status: DC | PRN
Start: 2019-12-04 — End: 2019-12-17

## 2019-12-04 MED ORDER — PROMETHAZINE HCL 12.5 MG PO TABS
12.5000 mg | ORAL_TABLET | ORAL | Status: DC | PRN
  Filled 2019-12-04: qty 2

## 2019-12-04 NOTE — PMR Pre-admission (Signed)
PMR Admission Coordinator Pre-Admission Assessment  Patient: Jason Skinner is an 29 y.o., male MRN: [id number] DOB: 1990/12/29 Height: 5\' 7"  (170.2 cm) Weight: 71.1 kg              Insurance Information HMO:     PPO:      PCP:      IPA:      80/20:      OTHER:  PRIMARY: worker's comp      Policy#:       Subscriber:  CM Name: Coralyn Mark      Phone#: [phone]     Fax#:  Pre-Cert#:       Employer:  Benefits:  Phone #:      Name:  Eff. Date:      Deduct:       Out of Pocket Max:       Life Max:   CIR:       SNF:  Outpatient:      Co-Pay:  Home Health:       Co-Pay:  DME:      Co-Pay:  Providers:  SECONDARY:       Policy#:       Phone#:   Development worker, community:       Phone#:   The Engineer, petroleum" for patients in Inpatient Rehabilitation Facilities with attached "Privacy Act [place] Records" was provided and verbally reviewed with: N/A  Emergency Contact Information Contact Information    Name Relation Home Work [place] [phone]  [phone]   Jason Skinner   [phone]     Current Medical History  Patient Admitting Diagnosis: syndrome of trephined History of Present Illness: Jason Skinner is a 29 year old limited English speaking right-handed male well-known to rehab services from admission 09/20/2019 to 10/05/2019 after 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  Presented 11/23/2019 with noted decrease in mobility altered mental status as well as increase headache.  Admission chemistry potassium 3.2 glucose 109 alcohol negative urine drug screen negative lactic acid 1.2 urine culture less than 10,000 insignificant growth.  Cranial CT scan showed interval increase in mass-effect and despite the large  craniectomy defect on the right.  The right cerebrum, principally the right frontal lobe is flattened and compressed with midline shift to the left of 1.5 cm.  No new intracranial hemorrhage noted.  Patient was seen by neurosurgery for Syndrome of the Trephined.  Patient underwent retrieval craniectomy flap from the abdomen and cranioplasty 11/28/2019 per Dr. Arnoldo Morale.  Maintained on Keppra for seizure prophylaxis.  He was cleared for Lovenox for DVT prophylaxis 12/02/2019.  Patient did have some urinary retention placed on low-dose Urecholine.  Tolerating a regular diet.  Therapy evaluations completed and patient was recommended for a comprehensive rehab program.  Complete NIHSS TOTAL: 0 Glasgow Coma Scale Score: 14  Past Medical History  History reviewed. No pertinent past medical history.  Family History  family history is not on file.  Prior Rehab/Hospitalizations:  Has the patient had prior rehab or hospitalizations prior to admission? Yes  Has the patient had major surgery during 100 days prior to admission? Yes  Current Medications   Current Facility-Administered Medications:  .  acetaminophen (TYLENOL) tablet 650 mg, 650 mg, Oral, Q4H PRN, 650 mg at 11/30/19 1208 **OR** acetaminophen (TYLENOL) suppository 650 mg, 650  mg, Rectal, Q4H PRN, Newman Pies, MD .  bethanechol (URECHOLINE) tablet 10 mg, 10 mg, Oral, TID, Bergman, Meghan D, NP, 10 mg at 12/04/19 0919 .  bisacodyl (DULCOLAX) EC tablet 5 mg, 5 mg, Oral, Daily PRN, Bergman, Meghan D, NP .  diphenhydrAMINE (BENADRYL) capsule 50 mg, 50 mg, Oral, QHS PRN, Meyran, Ocie Cornfield, NP, 50 mg at 12/04/19 0242 .  docusate sodium (COLACE) capsule 100 mg, 100 mg, Oral, BID, Newman Pies, MD, 100 mg at 12/04/19 0919 .  enoxaparin (LOVENOX) injection 40 mg, 40 mg, Subcutaneous, Q24H, Vallarie Mare, MD, 40 mg at 12/04/19 1255 .  HYDROcodone-acetaminophen (NORCO/VICODIN) 5-325 MG per tablet 1 tablet, 1 tablet, Oral, Q4H PRN,  Newman Pies, MD, 1 tablet at 12/04/19 0919 .  labetalol (NORMODYNE) injection 10-40 mg, 10-40 mg, Intravenous, Q10 min PRN, Newman Pies, MD .  levETIRAcetam (KEPPRA) tablet 500 mg, 500 mg, Oral, BID, Bergman, Meghan D, NP, 500 mg at 12/04/19 0419 .  meclizine (ANTIVERT) tablet 12.5 mg, 12.5 mg, Oral, TID PRN, Bergman, Meghan D, NP .  methocarbamol (ROBAXIN) tablet 500 mg, 500 mg, Oral, Q8H PRN, Bergman, Meghan D, NP, 500 mg at 11/29/19 1624 .  morphine 2 MG/ML injection 2-4 mg, 2-4 mg, Intravenous, Q2H PRN, Newman Pies, MD, 2 mg at 11/30/19 0758 .  ondansetron (ZOFRAN) tablet 4 mg, 4 mg, Oral, Q4H PRN **OR** ondansetron (ZOFRAN) injection 4 mg, 4 mg, Intravenous, Q4H PRN, Newman Pies, MD .  pantoprazole (PROTONIX) EC tablet 40 mg, 40 mg, Oral, QAC breakfast, Bergman, Meghan D, NP, 40 mg at 12/04/19 0388 .  polyethylene glycol (MIRALAX / GLYCOLAX) packet 17 g, 17 g, Oral, Daily, Bergman, Meghan D, NP, 17 g at 12/04/19 0919 .  promethazine (PHENERGAN) tablet 12.5-25 mg, 12.5-25 mg, Oral, Q4H PRN, Newman Pies, MD .  sodium phosphate (FLEET) 7-19 GM/118ML enema 1 enema, 1 enema, Rectal, Once PRN, Bergman, Meghan D, NP .  topiramate (TOPAMAX) tablet 75 mg, 75 mg, Oral, BID, Bergman, Meghan D, NP, 75 mg at 12/04/19 0919  Patients Current Diet:  Diet Order            Diet regular Room service appropriate? No; Fluid consistency: Thin  Diet effective 0500                 Precautions / Restrictions Precautions Precautions: Fall Precaution Comments: flat affect, poor insight  Restrictions Weight Bearing Restrictions: No   Has the patient had 2 or more falls or a fall with injury in the past year?Yes  Prior Activity Level Limited Community (1-2x/wk): recently on CIR for TBI in July of 2021 after falling from a ladder at workmod I to supervision at home with family after CIR in July 2021  Prior Functional Level Prior Function Level of Independence: Needs  assistance Gait / Transfers Assistance Needed: family member in room states pt was walking indoors without assist after last surgery and before this surgery. ADL's / Homemaking Assistance Needed: Pt could do own self care.  Had assist for home tasks. Comments: wife and 2 daughters in [place]  Self Care: Did the patient need help bathing, dressing, using the toilet or eating?  Independent  Indoor Mobility: Did the patient need assistance with walking from room to room (with or without device)? Independent  Stairs: Did the patient need assistance with internal or external stairs (with or without device)? Needed some help  Functional Cognition: Did the patient need help planning regular tasks such as shopping or remembering to take medications?  Needed some help  Home Assistive Devices / Equipment    Prior Device Use: Indicate devices/aids used by the patient prior to current illness, exacerbation or injury? None of the above  Current Functional Level Cognition  Arousal/Alertness: Awake/alert Overall Cognitive Status: Impaired/Different from baseline Current Attention Level: Sustained Orientation Level: Oriented to person, Oriented to place, Oriented to time, Disoriented to situation Following Commands: Follows one step commands consistently, Follows multi-step commands inconsistently Safety/Judgement: Decreased awareness of safety, Decreased awareness of deficits General Comments: Oriented, however poor insight to his current situation and level of function.  Attention: Focused, Sustained, Selective Focused Attention: Appears intact Sustained Attention: Appears intact Selective Attention: Appears intact Memory: Impaired Memory Impairment: Retrieval deficit, Decreased short term memory Decreased Short Term Memory: Verbal basic Awareness: Impaired Awareness Impairment: Intellectual impairment Problem Solving: Impaired Problem Solving Impairment: Verbal basic Safety/Judgment: Appears  intact    Extremity Assessment (includes Sensation/Coordination)  Upper Extremity Assessment: Defer to OT evaluation  Lower Extremity Assessment: Overall WFL for tasks assessed    ADLs  Overall ADL's : Needs assistance/impaired Eating/Feeding: Minimal assistance, Sitting Grooming: Oral care, Wash/dry face, Wash/dry hands, Set up, Sitting Upper Body Bathing: Minimal assistance, Sitting Lower Body Bathing: Moderate assistance, Sit to/from stand, Cueing for compensatory techniques Upper Body Dressing : Minimal assistance, Sitting Lower Body Dressing: Moderate assistance, Sit to/from stand, Cueing for compensatory techniques Toilet Transfer: Minimal assistance, BSC, Stand-pivot, Cueing for sequencing Toileting- Clothing Manipulation and Hygiene: Moderate assistance, Sit to/from stand, Cueing for compensatory techniques Functional mobility during ADLs: Moderate assistance General ADL Comments: Pt still with flat affect but enjoys talking about fast and furious movies! He dons B socks from bed level with set up A.    Mobility  Overal bed mobility: Needs Assistance Bed Mobility: Supine to Sit, Sit to Supine Supine to sit: Min guard Sit to supine: Min guard General bed mobility comments: Pt reaching out for therapist's hand to pull up into sitting. Redirected him to use the railing for support and pt was able to transition to full sitting position without assist. Pt required assist from therapist more for management of lines as pt with no attention to lines and getting feet tangled up in condom cath line as well as leads to telemetry box    Transfers  Overall transfer level: Needs assistance Equipment used: 1 person hand held assist Transfers: Sit to/from Stand, Stand Pivot Transfers Sit to Stand: Min assist Stand pivot transfers: Min assist General transfer comment: Assist for balance support and safety with power-up to full stand and transition to/from [facility] at bedside.     Ambulation /  Gait / Stairs / Wheelchair Mobility  Ambulation/Gait General Gait Details: Not able to progress ambulation at this time due to HR    Posture / Balance Dynamic Sitting Balance Sitting balance - Comments: Pt is impulsive. Balance Overall balance assessment: Needs assistance Sitting-balance support: Feet supported, Bilateral upper extremity supported Sitting balance-Leahy Scale: Good Sitting balance - Comments: Pt is impulsive. Standing balance support: During functional activity, Single extremity supported Standing balance-Leahy Scale: Poor Standing balance comment: reliant on external assist    Special needs/care consideration Behavioral consideration hx of TBI, Special service needs spanish interpreter and Designated visitor brother [name] and uncle [place] (from acute therapy documentation) Living Arrangements: Other relatives  Lives With: Family Available Help at Discharge: Friend(s), Available 24 hours/day Type of Home: Mobile home Home Layout: One level Home Access: Stairs to enter Entrance Stairs-Rails: Left, Right Entrance  Stairs-Number of Steps: 4-5 Bathroom Shower/Tub: Optometrist: Yes How Accessible: Accessible via walker Additional Comments: 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 Brother is here.   Discharge Living Setting Plans for Discharge Living Setting: Patient's home Type of Home at Discharge: Mobile home Discharge Home Layout: One level Discharge Home Access: Stairs to enter Entrance Stairs-Rails: Right, Left Entrance Stairs-Number of Steps: 4-5 Discharge Bathroom Shower/Tub: Tub/shower unit Discharge Bathroom Toilet: Standard Discharge Bathroom Accessibility: Yes How Accessible: Accessible via  walker Does the patient have any problems obtaining your medications?: Yes (Describe) (uninsured, medicaid pending)  Social/Family/Support Systems Anticipated Caregiver: brother [place] and uncle [place] Anticipated Caregiver's Contact Information: [name] [phone]; [name] [phone] Ability/Limitations of Caregiver: family works, but can take time off to arrange 24/7 Caregiver Availability: 24/7 Discharge Plan Discussed with Primary Caregiver: Yes Is Caregiver In Agreement with Plan?: Yes Does Caregiver/Family have Issues with Lodging/Transportation while Pt is in Rehab?: No   Goals Patient/Family Goal for Rehab: PT/OT supervision to mod I, SLP supervision Expected length of stay: 16-18 days Additional Information: Pt with previous admission, July 2021, to CIR with Dr. Naaman Plummer Pt/Family Agrees to Admission and willing to participate: Yes Program Orientation Provided & Reviewed with Pt/Caregiver Including Roles  & Responsibilities: Yes  Barriers to Discharge: Insurance for SNF coverage, Decreased caregiver support   Decrease burden of Care through IP rehab admission: n/a   Possible need for SNF placement upon discharge: Not anticiapted   Patient Condition: This patient's condition remains as documented in the consult dated 9/28, in which the Rehabilitation Physician determined and documented that the patient's condition is appropriate for intensive rehabilitative care in an inpatient rehabilitation facility. Will admit to inpatient rehab today.  Preadmission Screen Completed By:  Michel Santee, PT, DPT 12/04/2019 2:40 PM ______________________________________________________________________   Discussed status with Dr. Ranell Patrick on 12/04/19 at 2:41 PM  and received approval for admission today.  Admission Coordinator:  Michel Santee, PT, DPT time 2:41 PM Sudie Grumbling 12/04/19

## 2019-12-04 NOTE — Discharge Summary (Signed)
Physician Discharge Summary     Providing Compassionate, Quality Care - Together   Patient ID: Jason Skinner MRN: [id number] DOB/AGE: 1991/02/22 29 y.o.  Admit date: 11/23/2019 Discharge date: 12/04/2019  Admission Diagnoses: Status post craniectomy  Discharge Diagnoses:  Active Problems:   Status post craniectomy   Encephalopathy   Discharged Condition: good  Hospital Course: Patient was admitted on 11/23/2019 due to altered mental status.  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  The patient's bone flap was replaced on 11/28/2019 by Dr. Arnoldo Morale.  The patient's neurologic status has improved since replacement of the bone flap.  There has been no seizure activity.  He is eating and drinking without difficulty. He has been working with therapies, who are recommending inpatient rehab at discharge.  At this time, his neurologic status and physical exam are appropriate for CIR.  Consults: rehabilitation medicine  Significant Diagnostic Studies: radiology: DG ELBOW COMPLETE RIGHT (3+VIEW)  Result Date: 09/05/2019 CLINICAL DATA:  Pain status post fall EXAM: RIGHT ELBOW - COMPLETE 3+ VIEW COMPARISON:  None. FINDINGS: There is no evidence of fracture, dislocation, or joint effusion. There is no evidence of arthropathy or other focal bone abnormality. Soft tissues are unremarkable. IMPRESSION: Negative. Electronically Signed   By: Constance Holster M.D.   On: 09/05/2019 14:53   DG Abd 1 View  Result Date: 09/05/2019 CLINICAL DATA:  Pain. EXAM: ABDOMEN - 1 VIEW COMPARISON:  CT from the same day FINDINGS: The enteric tube projects over the stomach. The visualized bowel gas pattern is unremarkable. IMPRESSION: Enteric tube projects over the stomach. Electronically Signed   By: Constance Holster M.D.   On: 09/05/2019 14:54   CT HEAD WO CONTRAST  Result Date: 11/23/2019 CLINICAL DATA:  Mental status change of unknown cause. EXAM: CT HEAD  WITHOUT CONTRAST TECHNIQUE: Contiguous axial images were obtained from the base of the skull through the vertex without intravenous contrast. COMPARISON:  09/06/2019 FINDINGS: Brain: Large right sided craniectomy unchanged from the most recent prior CT. The subdural drain has been removed. There is significant mass effect with flattening of the right frontal lobe and midline shift to the left of 1.5 cm. The mass effect and midline shift have significantly increased from the prior study. No evidence of an ischemic infarct. No new/acute intracranial hemorrhage. Mild enlargement of the left lateral ventricle compared to the prior CT, suggesting partial entrapment. Vascular: Unremarkable. Skull: No lesion.  Stable from the prior CT. Sinuses/Orbits: Globes and orbits are unremarkable. Visualized sinuses and mastoid air cells are clear. Other: Right-sided soft tissue air, hemorrhage and edema noted on the prior CT has resolved. IMPRESSION: 1. Interval increase in mass effect. Despite the large craniectomy defect on the right, the right cerebrum, principally the right frontal lobe, is flattened/compressed with significant midline shift to the left of 1.5 cm. Mild enlargement of the left lateral ventricle suggest partial entrapment. No transtentorial herniation. 2. No new/acute intracranial hemorrhage and no evidence of an ischemic infarct. Electronically Signed   By: Lajean Manes M.D.   On: 11/23/2019 11:20   CT HEAD WO CONTRAST  Result Date: 09/06/2019 CLINICAL DATA:  Subdural hematoma. EXAM: CT HEAD WITHOUT CONTRAST TECHNIQUE: Contiguous axial images were obtained from the base of the skull through the vertex without intravenous contrast. COMPARISON:  Head CT 09/05/2019 FINDINGS: Brain: There postoperative changes from interval right-sided craniotomy for right subdural hematoma evacuation. There is decreased mass effect, now measuring 5 mm (previously 20  mm). Persistent although decreased leftward subfalcine  herniation. There is persistent hyperdense extra-axial hemorrhage overlying the right cerebral convexity at the craniotomy site which focally measures up to 2.3 cm in thickness along the course of a right subdural drain (series 5, image 39). There is also a small amount of extra-axial pneumocephalus adjacent to the craniotomy site. Persistent small-bowel subdural hemorrhage along the falx. There has been interval blooming of hemorrhagic contusion within the right greater than left anterior frontal lobes and anterior left temporal lobe, again with overlying acute subarachnoid hemorrhage. A component of the extra-axial hemorrhage overlying the frontal pole may be subdural. There has been interval placement of a right frontal approach ventricular drain which terminates within the anterior left lateral ventricle. Trace hemorrhage within the atrium of the left lateral ventricle. No evidence of hydrocephalus. There is a small focus of pneumocephalus within the left lateral ventricle frontal horn. Persistent although decreased effacement of the right lateral ventral Vascular: No hyperdense vessel. Skull: Right-sided craniotomy. Redemonstrated midline fracture involving the occipital calvarium and clivus with extension into the right sphenoid sinus. Sinuses/Orbits: Visualized orbits show no acute finding. Near complete opacification of the sphenoid sinuses. Air-fluid level within the right maxillary sinus. No significant mastoid effusion. Partially visualized support tubes. Other: Scalp edema overlying the right-sided craniotomy site with associated scalp staples. IMPRESSION: IMPRESSION 1. Postoperative changes from interval right-sided craniotomy for right subdural hematoma evacuation. 2. Decreased mass effect with now 5 mm leftward midline shift (previously 20 mm). Persistent although decreased leftward subfalcine herniation. 3. There is some persistent extra-axial hemorrhage overlying the right cerebral convexity at the  craniotomy site which focally measures up to 2.3 cm in thickness along the course of a subdural drain. Additionally, there is persistent small volume subdural hemorrhage along the falx. 4. Interval blooming of hemorrhagic contusion involving the right greater than left anterior frontal lobes and left temporal lobe with redemonstrated overlying acute subarachnoid hemorrhage. 5. Interval placement of a right frontal approach ventricular drain terminating within the anterior left lateral ventricle. No evidence of hydrocephalus. Trace hemorrhage within the atrium of the left lateral ventricle. Electronically Signed   By: Kellie Simmering DO   On: 09/06/2019 07:39   CT HEAD WO CONTRAST  Result Date: 09/05/2019 CLINICAL DATA:  Level 1 trauma EXAM: CT HEAD WITHOUT CONTRAST CT MAXILLOFACIAL WITHOUT CONTRAST CT CERVICAL SPINE WITHOUT CONTRAST TECHNIQUE: Multidetector CT imaging of the head, cervical spine, and maxillofacial structures were performed using the standard protocol without intravenous contrast. Multiplanar CT image reconstructions of the cervical spine and maxillofacial structures were also generated. COMPARISON:  None. FINDINGS: CT HEAD FINDINGS Brain: Mixed density subdural hematoma along the right cerebral convexity and left frontal pole. Mixed density could be from a continued and un clotted bleeding, especially on the right. The right-sided hematoma measures up to 15 mm in thickness. Midline shift measures up to 2 cm. Pneumocephalus with intraventricular extension. Right uncal herniation. Mix of contusion and subarachnoid hemorrhage over the left temporal pole which is edematous. No visible infarct. Vascular: Negative Skull: Midline skull fracture involving the occiput and clivus. There is extension into the right sphenoid sinus, which accounts for pneumocephalus and sinus opacification. Posterior scalp contusion CT MAXILLOFACIAL FINDINGS Osseous: Intact and located mandible. Midline clivus fracture  extending to the right sphenoid sinus. Orbits: No evidence of injury. Sinuses: Right sphenoid and maxillary hemosinus Soft tissues: Negative CT CERVICAL SPINE FINDINGS Alignment: Normal Skull base and vertebrae: Clival fracture. No cervical spine fracture. Soft tissues and spinal canal: Gas  present around the dens. The tectorial membrane is continuous and visible. No visible canal hematoma Disc levels:  No degenerative changes or visible impingement. Upper chest: Reported separately Critical Value/emergent results were discussed in person on 09/05/2019 at 11:35 am with Dr Bobbye Morton. IMPRESSION: Head CT: 1. Right more than left cerebral convexity subdural hematoma with leftward midline shift of 2 cm and right uncal herniation. 2. Left temporal pole contusion with overlying subarachnoid hemorrhage. 3. Occipital and clival midline fractures extending into the right sphenoid sinus with pneumocephalus. Cervical spine CT: Gas around the dens presumably related to the skull base fracture. Craniocervical alignment is normal and the tectorial membrane is visible. Alar or other ligamentous injury is still conceivable. Maxillofacial CT: Clival fracture extending into the right sphenoid sinus with hemosinus and pneumocephalus. Electronically Signed   By: Monte Fantasia M.D.   On: 09/05/2019 11:40   CT CHEST W CONTRAST  Result Date: 09/05/2019 CLINICAL DATA:  Moderate to severe chest trauma EXAM: CT CHEST, ABDOMEN, AND PELVIS WITH CONTRAST TECHNIQUE: Multidetector CT imaging of the chest, abdomen and pelvis was performed following the standard protocol during bolus administration of intravenous contrast. CONTRAST:  Dose is currently not available COMPARISON:  None. FINDINGS: CT CHEST FINDINGS Cardiovascular: Normal heart size. No pericardial effusion. No evidence of great vessel injury. Mediastinum/Nodes: No hematoma or pneumomediastinum. There is a coarse calcification in the right lobe of the thyroid which is asymmetrically  larger with a 2 cm nodular projection extending inferiorly that is isodense to the remaining gland. Lungs/Pleura: No hemothorax, pneumothorax, or lung contusion. Dependent opacity with volume loss is likely atelectasis and aspiration. Musculoskeletal: No acute finding CT ABDOMEN PELVIS FINDINGS Hepatobiliary: No hepatic injury or perihepatic hematoma. Gallbladder is unremarkable Pancreas: Negative Spleen: Left upper quadrant poly splenia or localize splenosis. No acute injury. Adrenals/Urinary Tract: No adrenal hemorrhage or renal injury identified. Bladder is unremarkable. Stomach/Bowel: No evidence of injury Vascular/Lymphatic: No acute vascular finding Reproductive: Negative Other: No ascites or pneumoperitoneum. Musculoskeletal: L1 and L2 right transverse process fractures. Vacuum phenomenon around the sacroiliac joints without diastasis IMPRESSION: 1. L1 and L2 right transverse process fractures. 2. Bilateral dependent atelectasis and or aspiration. 3. 2 cm nodular projection from the right thyroid lobe. After convalescence, recommend thyroid US(Ref: J Am Coll Radiol. 2015 Feb;12(2): 143-50). Electronically Signed   By: Monte Fantasia M.D.   On: 09/05/2019 11:45   CT CERVICAL SPINE WO CONTRAST  Result Date: 09/05/2019 CLINICAL DATA:  Level 1 trauma EXAM: CT HEAD WITHOUT CONTRAST CT MAXILLOFACIAL WITHOUT CONTRAST CT CERVICAL SPINE WITHOUT CONTRAST TECHNIQUE: Multidetector CT imaging of the head, cervical spine, and maxillofacial structures were performed using the standard protocol without intravenous contrast. Multiplanar CT image reconstructions of the cervical spine and maxillofacial structures were also generated. COMPARISON:  None. FINDINGS: CT HEAD FINDINGS Brain: Mixed density subdural hematoma along the right cerebral convexity and left frontal pole. Mixed density could be from a continued and un clotted bleeding, especially on the right. The right-sided hematoma measures up to 15 mm in thickness.  Midline shift measures up to 2 cm. Pneumocephalus with intraventricular extension. Right uncal herniation. Mix of contusion and subarachnoid hemorrhage over the left temporal pole which is edematous. No visible infarct. Vascular: Negative Skull: Midline skull fracture involving the occiput and clivus. There is extension into the right sphenoid sinus, which accounts for pneumocephalus and sinus opacification. Posterior scalp contusion CT MAXILLOFACIAL FINDINGS Osseous: Intact and located mandible. Midline clivus fracture extending to the right sphenoid sinus. Orbits: No evidence  of injury. Sinuses: Right sphenoid and maxillary hemosinus Soft tissues: Negative CT CERVICAL SPINE FINDINGS Alignment: Normal Skull base and vertebrae: Clival fracture. No cervical spine fracture. Soft tissues and spinal canal: Gas present around the dens. The tectorial membrane is continuous and visible. No visible canal hematoma Disc levels:  No degenerative changes or visible impingement. Upper chest: Reported separately Critical Value/emergent results were discussed in person on 09/05/2019 at 11:35 am with Dr Bobbye Morton. IMPRESSION: Head CT: 1. Right more than left cerebral convexity subdural hematoma with leftward midline shift of 2 cm and right uncal herniation. 2. Left temporal pole contusion with overlying subarachnoid hemorrhage. 3. Occipital and clival midline fractures extending into the right sphenoid sinus with pneumocephalus. Cervical spine CT: Gas around the dens presumably related to the skull base fracture. Craniocervical alignment is normal and the tectorial membrane is visible. Alar or other ligamentous injury is still conceivable. Maxillofacial CT: Clival fracture extending into the right sphenoid sinus with hemosinus and pneumocephalus. Electronically Signed   By: Monte Fantasia M.D.   On: 09/05/2019 11:40   CT ABDOMEN PELVIS W CONTRAST  Result Date: 09/05/2019 CLINICAL DATA:  Moderate to severe chest trauma EXAM: CT  CHEST, ABDOMEN, AND PELVIS WITH CONTRAST TECHNIQUE: Multidetector CT imaging of the chest, abdomen and pelvis was performed following the standard protocol during bolus administration of intravenous contrast. CONTRAST:  Dose is currently not available COMPARISON:  None. FINDINGS: CT CHEST FINDINGS Cardiovascular: Normal heart size. No pericardial effusion. No evidence of great vessel injury. Mediastinum/Nodes: No hematoma or pneumomediastinum. There is a coarse calcification in the right lobe of the thyroid which is asymmetrically larger with a 2 cm nodular projection extending inferiorly that is isodense to the remaining gland. Lungs/Pleura: No hemothorax, pneumothorax, or lung contusion. Dependent opacity with volume loss is likely atelectasis and aspiration. Musculoskeletal: No acute finding CT ABDOMEN PELVIS FINDINGS Hepatobiliary: No hepatic injury or perihepatic hematoma. Gallbladder is unremarkable Pancreas: Negative Spleen: Left upper quadrant poly splenia or localize splenosis. No acute injury. Adrenals/Urinary Tract: No adrenal hemorrhage or renal injury identified. Bladder is unremarkable. Stomach/Bowel: No evidence of injury Vascular/Lymphatic: No acute vascular finding Reproductive: Negative Other: No ascites or pneumoperitoneum. Musculoskeletal: L1 and L2 right transverse process fractures. Vacuum phenomenon around the sacroiliac joints without diastasis IMPRESSION: 1. L1 and L2 right transverse process fractures. 2. Bilateral dependent atelectasis and or aspiration. 3. 2 cm nodular projection from the right thyroid lobe. After convalescence, recommend thyroid US(Ref: J Am Coll Radiol. 2015 Feb;12(2): 143-50). Electronically Signed   By: Monte Fantasia M.D.   On: 09/05/2019 11:45   DG Chest Port 1 View  Result Date: 11/23/2019 CLINICAL DATA:  Altered mental status, leukocytosis. EXAM: PORTABLE CHEST 1 VIEW COMPARISON:  Prior chest radiographs 09/14/2019 and earlier. FINDINGS: Heart size within  normal limits. There is no appreciable airspace consolidation. No evidence of pleural effusion or pneumothorax. No acute bony abnormality identified. IMPRESSION: No evidence of active cardiopulmonary disease. Electronically Signed   By: Kellie Simmering DO   On: 11/23/2019 14:41   DG CHEST PORT 1 VIEW  Result Date: 09/14/2019 CLINICAL DATA:  Leukocytosis EXAM: PORTABLE CHEST 1 VIEW COMPARISON:  Portable exam 0957 hours compared to 09/11/2019 FINDINGS: Normal heart size, mediastinal contours, and pulmonary vascularity. Subsegmental atelectasis RIGHT base. Remaining lungs clear. No pulmonary infiltrate, pleural effusion or pneumothorax. Old healed fracture mid LEFT clavicle. No acute osseous findings. IMPRESSION: Subsegmental atelectasis RIGHT base. Electronically Signed   By: Lavonia Dana M.D.   On: 09/14/2019 11:41  DG Chest Port 1 View  Result Date: 09/11/2019 CLINICAL DATA:  Respiratory failure, fall, head injury EXAM: PORTABLE CHEST 1 VIEW COMPARISON:  09/06/2019 FINDINGS: The heart size and mediastinal contours are within normal limits. Subtle heterogeneous airspace opacities of the lung bases. The visualized skeletal structures are unremarkable. IMPRESSION: Subtle heterogeneous airspace opacities of the lung bases, suspicious for infection or aspiration. Electronically Signed   By: Eddie Candle M.D.   On: 09/11/2019 10:57   DG Chest Port 1 View  Result Date: 09/06/2019 CLINICAL DATA:  Fall. EXAM: PORTABLE CHEST 1 VIEW COMPARISON:  Chest CT and chest x-ray 09/05/2019. FINDINGS: Endotracheal tube noted stable position. NG tube noted with tip coiled in stomach. Mediastinum hilar structures normal. Heart size normal. Lungs scratched it mild right mid lung subsegmental atelectasis. No focal alveolar infiltrate. No pleural effusion or pneumothorax. No acute bony abnormality identified IMPRESSION: 1. Endotracheal tube in stable position. NG tube noted with tip coiled in stomach. 2. Mild right base subsegmental  atelectasis. No acute cardiopulmonary disease otherwise noted. Electronically Signed   By: Marcello Moores  Register   On: 09/06/2019 06:48   VAS [place] LOWER EXTREMITY VENOUS (DVT)  Result Date: 09/17/2019  Lower Venous DVTStudy Indications: Leukocytosis.  Comparison Study: No prior study Performing Technologist: Maudry Mayhew MHA, RDMS, RVT, RDCS  Examination Guidelines: A complete evaluation includes B-mode imaging, spectral Doppler, color Doppler, and power Doppler as needed of all accessible portions of each vessel. Bilateral testing is considered an integral part of a complete examination. Limited examinations for reoccurring indications may be performed as noted. The reflux portion of the exam is performed with the patient in reverse Trendelenburg.  +---------+---------------+---------+-----------+----------+--------------+ RIGHT    CompressibilityPhasicitySpontaneityPropertiesThrombus Aging +---------+---------------+---------+-----------+----------+--------------+ CFV      Full           Yes      Yes                                 +---------+---------------+---------+-----------+----------+--------------+ SFJ      Full                                                        +---------+---------------+---------+-----------+----------+--------------+ FV Prox  Full                                                        +---------+---------------+---------+-----------+----------+--------------+ FV Mid   Full                                                        +---------+---------------+---------+-----------+----------+--------------+ FV DistalFull                                                        +---------+---------------+---------+-----------+----------+--------------+ PFV      Full                                                        +---------+---------------+---------+-----------+----------+--------------+  POP      Full           Yes      Yes                                  +---------+---------------+---------+-----------+----------+--------------+ PTV      Full                                                        +---------+---------------+---------+-----------+----------+--------------+ PERO     Full                                                        +---------+---------------+---------+-----------+----------+--------------+   +---------+---------------+---------+-----------+----------+--------------+ LEFT     CompressibilityPhasicitySpontaneityPropertiesThrombus Aging +---------+---------------+---------+-----------+----------+--------------+ CFV      Full           Yes      Yes                                 +---------+---------------+---------+-----------+----------+--------------+ SFJ      Full                                                        +---------+---------------+---------+-----------+----------+--------------+ FV Prox  Full                                                        +---------+---------------+---------+-----------+----------+--------------+ FV Mid   Full                                                        +---------+---------------+---------+-----------+----------+--------------+ FV DistalFull                                                        +---------+---------------+---------+-----------+----------+--------------+ PFV      Full                                                        +---------+---------------+---------+-----------+----------+--------------+ POP      Full           Yes      Yes                                 +---------+---------------+---------+-----------+----------+--------------+  PTV      Full                                                        +---------+---------------+---------+-----------+----------+--------------+ PERO     Full                                                         +---------+---------------+---------+-----------+----------+--------------+     Summary: RIGHT: - There is no evidence of deep vein thrombosis in the lower extremity.  - No cystic structure found in the popliteal fossa.  LEFT: - There is no evidence of deep vein thrombosis in the lower extremity.  - No cystic structure found in the popliteal fossa.  *See table(s) above for measurements and observations. Electronically signed by Harold Barban MD on 09/17/2019 at 6:33:31 AM.    Final    VAS [place] UPPER EXTREMITY VENOUS DUPLEX  Result Date: 09/17/2019 UPPER VENOUS STUDY  Indications: leukocytosis Limitations: Cervical collar. Comparison Study: No prior study Performing Technologist: Maudry Mayhew MHA, RDMS, RVT, RDCS  Examination Guidelines: A complete evaluation includes B-mode imaging, spectral Doppler, color Doppler, and power Doppler as needed of all accessible portions of each vessel. Bilateral testing is considered an integral part of a complete examination. Limited examinations for reoccurring indications may be performed as noted.  Right Findings: +----------+------------+---------+-----------+----------+--------------+ RIGHT     CompressiblePhasicitySpontaneousProperties   Summary     +----------+------------+---------+-----------+----------+--------------+ IJV                                                 Not visualized +----------+------------+---------+-----------+----------+--------------+ Subclavian    Full       Yes       Yes                             +----------+------------+---------+-----------+----------+--------------+ Axillary      Full       Yes       Yes                             +----------+------------+---------+-----------+----------+--------------+ Brachial      Full       Yes       Yes                             +----------+------------+---------+-----------+----------+--------------+ Radial        Full                                                  +----------+------------+---------+-----------+----------+--------------+ Ulnar         Full                                                 +----------+------------+---------+-----------+----------+--------------+  Cephalic      Full                                                 +----------+------------+---------+-----------+----------+--------------+ Basilic       Full                                                 +----------+------------+---------+-----------+----------+--------------+  Left Findings: +----------+------------+---------+-----------+--------------+-----------------+ LEFT      CompressiblePhasicitySpontaneous  Properties       Summary      +----------+------------+---------+-----------+--------------+-----------------+ IJV                                                      Not visualized   +----------+------------+---------+-----------+--------------+-----------------+ Subclavian    Full       Yes       Yes                                    +----------+------------+---------+-----------+--------------+-----------------+ Axillary      Full       Yes       Yes                                    +----------+------------+---------+-----------+--------------+-----------------+ Brachial      Full       Yes       Yes                                    +----------+------------+---------+-----------+--------------+-----------------+ Radial        Full                                                        +----------+------------+---------+-----------+--------------+-----------------+ Ulnar         Full                                                        +----------+------------+---------+-----------+--------------+-----------------+ Cephalic      None                            spongy    Age Indeterminate                                           w/compression                    +----------+------------+---------+-----------+--------------+-----------------+  Basilic                                                  Not visualized   +----------+------------+---------+-----------+--------------+-----------------+  Summary:  Right: No evidence of deep vein thrombosis involving the visualized veins of the upper extremity. No evidence of superficial vein thrombosis in the upper extremity.  Left: No evidence of deep vein thrombosis involving the visualized veins of the upper extremity. 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  *See table(s) above for measurements and observations.  Diagnosing physician: Harold Barban MD Electronically signed by Harold Barban MD on 09/17/2019 at 6:33:37 AM.    Final    CT MAXILLOFACIAL WO CONTRAST  Result Date: 09/05/2019 CLINICAL DATA:  Level 1 trauma EXAM: CT HEAD WITHOUT CONTRAST CT MAXILLOFACIAL WITHOUT CONTRAST CT CERVICAL SPINE WITHOUT CONTRAST TECHNIQUE: Multidetector CT imaging of the head, cervical spine, and maxillofacial structures were performed using the standard protocol without intravenous contrast. Multiplanar CT image reconstructions of the cervical spine and maxillofacial structures were also generated. COMPARISON:  None. FINDINGS: CT HEAD FINDINGS Brain: Mixed density subdural hematoma along the right cerebral convexity and left frontal pole. Mixed density could be from a continued and un clotted bleeding, especially on the right. The right-sided hematoma measures up to 15 mm in thickness. Midline shift measures up to 2 cm. Pneumocephalus with intraventricular extension. Right uncal herniation. Mix of contusion and subarachnoid hemorrhage over the left temporal pole which is edematous. No visible infarct. Vascular: Negative Skull: Midline skull fracture involving the occiput and clivus. There is extension into the right sphenoid sinus, which accounts for pneumocephalus and  sinus opacification. Posterior scalp contusion CT MAXILLOFACIAL FINDINGS Osseous: Intact and located mandible. Midline clivus fracture extending to the right sphenoid sinus. Orbits: No evidence of injury. Sinuses: Right sphenoid and maxillary hemosinus Soft tissues: Negative CT CERVICAL SPINE FINDINGS Alignment: Normal Skull base and vertebrae: Clival fracture. No cervical spine fracture. Soft tissues and spinal canal: Gas present around the dens. The tectorial membrane is continuous and visible. No visible canal hematoma Disc levels:  No degenerative changes or visible impingement. Upper chest: Reported separately Critical Value/emergent results were discussed in person on 09/05/2019 at 11:35 am with Dr Bobbye Morton. IMPRESSION: Head CT: 1. Right more than left cerebral convexity subdural hematoma with leftward midline shift of 2 cm and right uncal herniation. 2. Left temporal pole contusion with overlying subarachnoid hemorrhage. 3. Occipital and clival midline fractures extending into the right sphenoid sinus with pneumocephalus. Cervical spine CT: Gas around the dens presumably related to the skull base fracture. Craniocervical alignment is normal and the tectorial membrane is visible. Alar or other ligamentous injury is still conceivable. Maxillofacial CT: Clival fracture extending into the right sphenoid sinus with hemosinus and pneumocephalus. Electronically Signed   By: Monte Fantasia M.D.   On: 09/05/2019 11:40     Treatments: surgery: Retrieval of craniectomy flap from the abdomen; cranioplasty  Discharge Exam: Blood pressure 113/77, pulse 100, temperature 98.1 F (36.7 C), temperature source Oral, resp. rate 20, height 5\' 7"  (1.702 m), weight 71.1 kg, SpO2 99 %.   Per report: Alert, oriented x 4 PERRLA Speech clear, delayed responses MAE, RUE slightly weaker than left Abdominal site OTA, closed with staples, site is clean, dry, and intact Right cranioplasty surgical site covered with gauze,  dressing  is clean, dry, and intact  Disposition: Discharge disposition: [place] Not Defined        Allergies as of 12/04/2019   No Known Allergies     Medication List    STOP taking these medications   bethanechol 10 MG tablet Commonly known as: URECHOLINE   lidocaine 5 % Commonly known as: LIDODERM     TAKE these medications   acetaminophen 500 MG tablet Commonly known as: TYLENOL Take 2 tablets (1,000 mg total) by mouth every 6 (six) hours as needed for headache.   HYDROcodone-acetaminophen 5-325 MG tablet Commonly known as: NORCO/VICODIN Take 1 tablet by mouth every 4 (four) hours as needed for moderate pain.   levETIRAcetam 500 MG tablet Commonly known as: KEPPRA Take 1 tablet (500 mg total) by mouth 2 (two) times daily.   meclizine 12.5 MG tablet Commonly known as: ANTIVERT Take 1 tablet (12.5 mg total) by mouth 3 (three) times daily as needed for dizziness.   methocarbamol 500 MG tablet Commonly known as: ROBAXIN Take 1 tablet (500 mg total) by mouth every 8 (eight) hours as needed for muscle spasms.   ondansetron 4 MG tablet Commonly known as: Zofran Take 1 tablet (4 mg total) by mouth daily as needed for nausea or vomiting.   polyethylene glycol 17 g packet Commonly known as: MIRALAX / GLYCOLAX Take 17 g by mouth daily.   topiramate 25 MG tablet Commonly known as: TOPAMAX Take 3 tablets (75 mg total) by mouth 2 (two) times daily.       Follow-up Information    Newman Pies, MD Follow up in 2 week(s).   Specialty: Neurosurgery Why: call for appointment Contact information: [address] [place] [address] [place] [address] [phone]               Signed: Patricia Nettle 12/04/2019, 12:33 PM

## 2019-12-04 NOTE — Progress Notes (Signed)
Patient admitted to [phone]. A&Ox4 no complaints of pain. Oriented to unit fall and visitor policy. Call bell within reach

## 2019-12-04 NOTE — TOC Transition Note (Signed)
Transition of Care [facility]) - CM/SW Discharge Note   Patient Details  Name: Jason Skinner MRN: [id number] Date of Birth: Mar 13, 1990  Transition of Care [facility]) CM/SW Contact:  Pollie Friar, RN Phone Number: 12/04/2019, 1:31 PM   Clinical Narrative:    Pt is discharging to CIR today. CM signing off.   Final next level of care: IP Rehab Facility Barriers to Discharge: Inadequate or no insurance, Barriers Unresolved (comment)   Patient Goals and CMS Choice     Choice offered to / list presented to : Sibling  Discharge Placement                       Discharge Plan and Services   Discharge Planning Services: CM Consult                                 Social Determinants of Health (SDOH) Interventions     Readmission Risk Interventions No flowsheet data found.

## 2019-12-04 NOTE — Progress Notes (Addendum)
Michel Santee, PT  Rehab Admission Coordinator  Physical Medicine and Rehabilitation  PMR Pre-admission     Signed  Date of Service:  12/04/2019 2:32 PM      Related encounter: ED to Hosp-Admission (Current) from 11/23/2019 in [facility]      Signed       Show:Clear all [x] Manual[x] Template[x] Copied  Added by: [x] Michel Santee, PT  [] Hover for details PMR Admission Coordinator Pre-Admission Assessment  Patient: Jason Skinner is an 29 y.o., male MRN: [id number] DOB: 1990-06-06 Height: 5\' 7"  (170.2 cm) Weight: 71.1 kg                                                                                                                                                  Insurance Information HMO:     PPO:      PCP:      IPA:      80/20:      OTHER:  PRIMARY: worker's comp      Policy#:       Subscriber:  CM Name: Coralyn Mark      Phone#: [phone]     Fax#:  Pre-Cert#:       Employer:  Benefits:  Phone #:      Name:  Irene Shipper. Date:      Deduct:       Out of Pocket Max:       Life Max:   CIR:       SNF:  Outpatient:      Co-Pay:  Home Health:       Co-Pay:  DME:      Co-Pay:  Providers:  SECONDARY:       Policy#:       Phone#:   Development worker, community:       Phone#:   [employer] Engineer, petroleum" for patients in Inpatient Rehabilitation Facilities with attached "Privacy Act [place] Records" was provided and verbally reviewed with: N/A  Emergency Contact Information         Contact Information    Name Relation Home Work [place] [phone]  [phone]   Cora Daniels   [phone]     Current Medical History  Patient Admitting Diagnosis: syndrome of trephined History of Present Illness: Jason LugoDominguezis a 29 year old limited English speaking right-handed male well-known to rehab services from admission 09/20/2019 to 10/05/2019 after TBI when he fell from a ladder  approximately 20 feet and underwent right frontotemporal craniectomy evacuation of acute subdural hematoma and insertion of right frontal ventriculostomy insertion of craniotomy flap to abdomen subcutaneous tissue 09/05/2019. Presented 11/23/2019 with noted decrease in mobility altered mental status as well as increase headache. Admission chemistry potassium 3.2 glucose 109 alcohol negative urine drug screen negative lactic acid 1.2 urine culture less than 10,000 insignificant growth. Cranial CT scan  showed interval increase in mass-effect and despite the large craniectomy defect on the right. The right cerebrum, principally the right frontal lobe is flattened and compressed with midline shift to the left of 1.5 cm. No new intracranial hemorrhage noted. Patient was seen by neurosurgery for Syndrome of the Trephined. Patient underwent retrieval craniectomy flap from the abdomen and cranioplasty 11/28/2019 per Dr. Arnoldo Morale. Maintained on Keppra for seizure prophylaxis. He was cleared for Lovenox for DVT prophylaxis 12/02/2019. Patient did have some urinary retention placed on low-dose Urecholine. Tolerating a regular diet. Therapy evaluations completed and patient was recommended for a comprehensive rehab program.  Complete NIHSS TOTAL: 0 Glasgow Coma Scale Score: 14  Past Medical History  History reviewed. No pertinent past medical history.  Family History  family history is not on file.  Prior Rehab/Hospitalizations:  Has the patient had prior rehab or hospitalizations prior to admission? Yes  Has the patient had major surgery during 100 days prior to admission? Yes  Current Medications   Current Facility-Administered Medications:  .  acetaminophen (TYLENOL) tablet 650 mg, 650 mg, Oral, Q4H PRN, 650 mg at 11/30/19 1208 **OR** acetaminophen (TYLENOL) suppository 650 mg, 650 mg, Rectal, Q4H PRN, Newman Pies, MD .  bethanechol (URECHOLINE) tablet 10 mg, 10 mg, Oral, TID, Bergman,  Meghan D, NP, 10 mg at 12/04/19 0919 .  bisacodyl (DULCOLAX) EC tablet 5 mg, 5 mg, Oral, Daily PRN, Bergman, Meghan D, NP .  diphenhydrAMINE (BENADRYL) capsule 50 mg, 50 mg, Oral, QHS PRN, Meyran, Ocie Cornfield, NP, 50 mg at 12/04/19 0242 .  docusate sodium (COLACE) capsule 100 mg, 100 mg, Oral, BID, Newman Pies, MD, 100 mg at 12/04/19 0919 .  enoxaparin (LOVENOX) injection 40 mg, 40 mg, Subcutaneous, Q24H, Vallarie Mare, MD, 40 mg at 12/04/19 1255 .  HYDROcodone-acetaminophen (NORCO/VICODIN) 5-325 MG per tablet 1 tablet, 1 tablet, Oral, Q4H PRN, Newman Pies, MD, 1 tablet at 12/04/19 0919 .  labetalol (NORMODYNE) injection 10-40 mg, 10-40 mg, Intravenous, Q10 min PRN, Newman Pies, MD .  levETIRAcetam (KEPPRA) tablet 500 mg, 500 mg, Oral, BID, Bergman, Meghan D, NP, 500 mg at 12/04/19 0419 .  meclizine (ANTIVERT) tablet 12.5 mg, 12.5 mg, Oral, TID PRN, Bergman, Meghan D, NP .  methocarbamol (ROBAXIN) tablet 500 mg, 500 mg, Oral, Q8H PRN, Bergman, Meghan D, NP, 500 mg at 11/29/19 1624 .  morphine 2 MG/ML injection 2-4 mg, 2-4 mg, Intravenous, Q2H PRN, Newman Pies, MD, 2 mg at 11/30/19 0758 .  ondansetron (ZOFRAN) tablet 4 mg, 4 mg, Oral, Q4H PRN **OR** ondansetron (ZOFRAN) injection 4 mg, 4 mg, Intravenous, Q4H PRN, Newman Pies, MD .  pantoprazole (PROTONIX) EC tablet 40 mg, 40 mg, Oral, QAC breakfast, Bergman, Meghan D, NP, 40 mg at 12/04/19 8366 .  polyethylene glycol (MIRALAX / GLYCOLAX) packet 17 g, 17 g, Oral, Daily, Bergman, Meghan D, NP, 17 g at 12/04/19 0919 .  promethazine (PHENERGAN) tablet 12.5-25 mg, 12.5-25 mg, Oral, Q4H PRN, Newman Pies, MD .  sodium phosphate (FLEET) 7-19 GM/118ML enema 1 enema, 1 enema, Rectal, Once PRN, Bergman, Meghan D, NP .  topiramate (TOPAMAX) tablet 75 mg, 75 mg, Oral, BID, Bergman, Meghan D, NP, 75 mg at 12/04/19 0919  Patients Current Diet:     Diet Order                  Diet regular Room service appropriate?  No; Fluid consistency: Thin  Diet effective 0500  Precautions / Restrictions Precautions Precautions: Fall Precaution Comments: flat affect, poor insight  Restrictions Weight Bearing Restrictions: No   Has the patient had 2 or more falls or a fall with injury in the past year?Yes  Prior Activity Level Limited Community (1-2x/wk): recently on CIR for TBI in July of 2021 after falling from a ladder at workmod I to supervision at home with family after CIR in July 2021  Prior Functional Level Prior Function Level of Independence: Needs assistance Gait / Transfers Assistance Needed: family member in room states pt was walking indoors without assist after last surgery and before this surgery. ADL's / Homemaking Assistance Needed: Pt could do own self care.  Had assist for home tasks. Comments: wife and 2 daughters in [place]  Self Care: Did the patient need help bathing, dressing, using the toilet or eating?  Independent  Indoor Mobility: Did the patient need assistance with walking from room to room (with or without device)? Independent  Stairs: Did the patient need assistance with internal or external stairs (with or without device)? Needed some help  Functional Cognition: Did the patient need help planning regular tasks such as shopping or remembering to take medications? Needed some help  Home Assistive Devices / Equipment  Prior Device Use: Indicate devices/aids used by the patient prior to current illness, exacerbation or injury? None of the above  Current Functional Level Cognition  Arousal/Alertness: Awake/alert Overall Cognitive Status: Impaired/Different from baseline Current Attention Level: Sustained Orientation Level: Oriented to person, Oriented to place, Oriented to time, Disoriented to situation Following Commands: Follows one step commands consistently, Follows multi-step commands inconsistently Safety/Judgement: Decreased  awareness of safety, Decreased awareness of deficits General Comments: Oriented, however poor insight to his current situation and level of function.  Attention: Focused, Sustained, Selective Focused Attention: Appears intact Sustained Attention: Appears intact Selective Attention: Appears intact Memory: Impaired Memory Impairment: Retrieval deficit, Decreased short term memory Decreased Short Term Memory: Verbal basic Awareness: Impaired Awareness Impairment: Intellectual impairment Problem Solving: Impaired Problem Solving Impairment: Verbal basic Safety/Judgment: Appears intact    Extremity Assessment (includes Sensation/Coordination)  Upper Extremity Assessment: Defer to OT evaluation  Lower Extremity Assessment: Overall WFL for tasks assessed    ADLs  Overall ADL's : Needs assistance/impaired Eating/Feeding: Minimal assistance, Sitting Grooming: Oral care, Wash/dry face, Wash/dry hands, Set up, Sitting Upper Body Bathing: Minimal assistance, Sitting Lower Body Bathing: Moderate assistance, Sit to/from stand, Cueing for compensatory techniques Upper Body Dressing : Minimal assistance, Sitting Lower Body Dressing: Moderate assistance, Sit to/from stand, Cueing for compensatory techniques Toilet Transfer: Minimal assistance, BSC, Stand-pivot, Cueing for sequencing Toileting- Clothing Manipulation and Hygiene: Moderate assistance, Sit to/from stand, Cueing for compensatory techniques Functional mobility during ADLs: Moderate assistance General ADL Comments: Pt still with flat affect but enjoys talking about fast and furious movies! He dons B socks from bed level with set up A.    Mobility  Overal bed mobility: Needs Assistance Bed Mobility: Supine to Sit, Sit to Supine Supine to sit: Min guard Sit to supine: Min guard General bed mobility comments: Pt reaching out for therapist's hand to pull up into sitting. Redirected him to use the railing for support and pt was able to  transition to full sitting position without assist. Pt required assist from therapist more for management of lines as pt with no attention to lines and getting feet tangled up in condom cath line as well as leads to telemetry box    Transfers  Overall transfer level: Needs assistance Equipment  used: 1 person hand held assist Transfers: Sit to/from Stand, Risk manager Sit to Stand: Min assist Stand pivot transfers: Min assist General transfer comment: Assist for balance support and safety with power-up to full stand and transition to/from [facility] at bedside.     Ambulation / Gait / Stairs / Wheelchair Mobility  Ambulation/Gait General Gait Details: Not able to progress ambulation at this time due to HR    Posture / Balance Dynamic Sitting Balance Sitting balance - Comments: Pt is impulsive. Balance Overall balance assessment: Needs assistance Sitting-balance support: Feet supported, Bilateral upper extremity supported Sitting balance-Leahy Scale: Good Sitting balance - Comments: Pt is impulsive. Standing balance support: During functional activity, Single extremity supported Standing balance-Leahy Scale: Poor Standing balance comment: reliant on external assist    Special needs/care consideration Behavioral consideration hx of TBI, Special service needs spanish interpreter and Designated visitor brother [name] and uncle Gramling (from acute therapy documentation) Living Arrangements: Other relatives  Lives With: Family Available Help at Discharge: Friend(s), Available 24 hours/day Type of Home: Mobile home Home Layout: One level Home Access: Stairs to enter Entrance Stairs-Rails: Left, Right Entrance Stairs-Number of Steps: 4-5 Bathroom Shower/Tub: Chiropodist: Standard Bathroom Accessibility: Yes How Accessible: Accessible via walker Additional Comments: 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 Brother is here.   Discharge Living Setting Plans for Discharge Living Setting: Patient's home Type of Home at Discharge: Mobile home Discharge Home Layout: One level Discharge Home Access: Stairs to enter Entrance Stairs-Rails: Right, Left Entrance Stairs-Number of Steps: 4-5 Discharge Bathroom Shower/Tub: Tub/shower unit Discharge Bathroom Toilet: Standard Discharge Bathroom Accessibility: Yes How Accessible: Accessible via walker Does the patient have any problems obtaining your medications?: Yes (Describe) (uninsured, medicaid pending)  Social/Family/Support Systems Anticipated Caregiver: brother [place] and uncle Justice Anticipated Caregiver's Contact Information: [name] [phone]; [name] [phone] Ability/Limitations of Caregiver: family works, but can take time off to arrange 24/7 Caregiver Availability: 24/7 Discharge Plan Discussed with Primary Caregiver: Yes Is Caregiver In Agreement with Plan?: Yes Does Caregiver/Family have Issues with Lodging/Transportation while Pt is in Rehab?: No   Goals Patient/Family Goal for Rehab: PT/OT supervision to mod I, SLP supervision Expected length of stay: 16-18 days Additional Information: Pt with previous admission, July 2021, to CIR with Dr. Naaman Plummer Pt/Family Agrees to Admission and willing to participate: Yes Program Orientation Provided & Reviewed with Pt/Caregiver Including Roles  & Responsibilities: Yes  Barriers to Discharge: Insurance for SNF coverage, Decreased caregiver support   Decrease burden of Care through IP rehab admission: n/a   Possible need for SNF placement upon discharge: Not anticiapted   Patient Condition: This patient's condition remains as documented in the consult dated 9/28, in which the Rehabilitation Physician determined and documented  that the patient's condition is appropriate for intensive rehabilitative care in an inpatient rehabilitation facility. Will admit to inpatient rehab today.  Preadmission Screen Completed By:  Michel Santee, PT, DPT 12/04/2019 2:40 PM ______________________________________________________________________   Discussed status with Dr. Ranell Patrick on 12/04/19 at 2:41 PM  and received approval for admission today.  Admission Coordinator:  Michel Santee, PT, DPT time 2:41 PM Sudie Grumbling 12/04/19             Cosigned by: [name],  MD at 12/04/2019 2:42 PM  Revision History                Note Details  Author Michel Santee, PT File Time 12/04/2019 2:41 PM  Author Type Rehab Admission Coordinator Status Signed  Last Editor Michel Santee, PT Service Physical Medicine and Rehabilitation

## 2019-12-04 NOTE — Progress Notes (Signed)
Report called to [place] on CIR.  Said she will let up know when the room is ready.

## 2019-12-04 NOTE — H&P (Signed)
Physical Medicine and Rehabilitation Admission H&P    Chief Complaint  Patient presents with  . Altered Mental Status  . 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  He was discharged to home ambulating 150 feet without assistance.  Per chart review lives with friends has a brother and family in the area.  Presented 11/23/2019 with noted decrease in mobility altered mental status as well as increase headache.  Admission chemistry potassium 3.2 glucose 109 alcohol negative urine drug screen negative lactic acid 1.2 urine culture less than 10,000 insignificant growth.  Cranial CT scan showed interval increase in mass-effect and despite the large craniectomy defect on the right.  The right cerebrum, principally the right frontal lobe is flattened and compressed with midline shift to the left of 1.5 cm.  No new intracranial hemorrhage noted.  Patient was seen by neurosurgery for syndrome of the Trephined.  Patient underwent retrieval craniectomy flap from the abdomen cranioplasty 11/28/2019 per Dr. Arnoldo Morale.  Maintained on Keppra for seizure prophylaxis.  He was cleared for Lovenox for DVT prophylaxis 12/02/2019.  Patient did have some urinary retention placed on low-dose Urecholine.  Tolerating a regular diet.  Therapy evaluations completed and patient was admitted for a comprehensive rehab program.  Review of Systems  Constitutional: Positive for malaise/fatigue. Negative for chills and fever.  HENT: Negative for hearing loss.   Eyes: Negative for blurred vision and double vision.  Respiratory: Negative for cough and shortness  of breath.   Cardiovascular: Negative for chest pain, palpitations and leg swelling.  Gastrointestinal: Positive for constipation. Negative for heartburn, nausea and vomiting.  Genitourinary: Negative for dysuria and hematuria.  Musculoskeletal: Positive for myalgias.  Skin: Negative for rash.  Neurological: Positive for dizziness and headaches.  Psychiatric/Behavioral: The patient has insomnia.   All other systems reviewed and are negative.  History reviewed. No pertinent past medical history. Past Surgical History:  Procedure Laterality Date  . CRANIOTOMY Right 09/05/2019   Procedure: CRANIOTOMY HEMATOMA EVACUATION SUBDURAL WITH  PLACEMENT OF BONE FLAP IN ABDOMEN.;  Surgeon: Newman Pies, MD;  Location: [facility];  Service: Neurosurgery;  Laterality: Right;  right  . CRANIOTOMY N/A 11/28/2019   Procedure: REMOVAL OF CRANIAL BONE FLAP FROM ABDOMEN WITH CRANIOTOMY FOR IMPLANTATION OF BONE FLAP;  Surgeon: Newman Pies, MD;  Location: [facility];  Service: Neurosurgery;  Laterality: N/A;   History reviewed. No pertinent family history. Social History:  reports that he has never smoked. He has never used smokeless tobacco. He reports previous alcohol use. He reports previous drug use. Allergies: No Known Allergies Medications Prior to Admission  Medication Sig Dispense Refill  . ondansetron (ZOFRAN) 4 MG tablet Take 1 tablet (4 mg total) by mouth daily as needed for nausea or vomiting. 30 tablet 1  . topiramate (TOPAMAX) 25 MG tablet Take 3 tablets (75 mg total) by mouth 2 (two) times daily. 60 tablet 0  . acetaminophen (TYLENOL) 500 MG tablet Take 2 tablets (1,000 mg total) by mouth every 6 (six) hours as needed for headache. (Patient not taking: Reported on 11/23/2019) 30 tablet 0  . bethanechol (URECHOLINE) 10 MG tablet Take 1 tablet (10 mg total) by mouth 3 (three) times daily. (  Patient not taking: Reported on 11/23/2019) 15 tablet 0  . lidocaine (LIDODERM) 5 % Place 1 patch onto the skin  daily. Remove & Discard patch within 12 hours or as directed by MD (Patient not taking: Reported on 11/23/2019) 30 patch 0  . meclizine (ANTIVERT) 12.5 MG tablet Take 1 tablet (12.5 mg total) by mouth 3 (three) times daily as needed for dizziness. (Patient not taking: Reported on 11/23/2019) 30 tablet 0  . methocarbamol (ROBAXIN) 500 MG tablet Take 1 tablet (500 mg total) by mouth every 8 (eight) hours as needed for muscle spasms. (Patient not taking: Reported on 11/23/2019) 60 tablet 0  . polyethylene glycol (MIRALAX / GLYCOLAX) 17 g packet Take 17 g by mouth daily. 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 Brother is here.   Lives With: Family   Functional History: Prior Function Level of Independence: Needs assistance Gait / Transfers Assistance Needed: family member in room states pt was walking indoors without assist after last surgery and before this surgery. ADL's / Homemaking Assistance Needed: Pt could do own self care.  Had assist for home tasks. Comments: wife and 2 daughters in [place]  Functional Status:  Mobility: Bed Mobility Overal bed mobility: Needs Assistance Bed Mobility: Supine to Sit, Sit to  Supine Supine to sit: Min guard Sit to supine: Min guard General bed mobility comments: Pt reaching out for therapist's hand to pull up into sitting. Redirected him to use the railing for support and pt was able to transition to full sitting position without assist. Pt required assist from therapist more for management of lines as pt with no attention to lines and getting feet tangled up in condom cath line as well as leads to telemetry box Transfers Overall transfer level: Needs assistance Equipment used: 1 person hand held assist Transfers: Sit to/from Stand, Stand Pivot Transfers Sit to Stand: Min assist Stand pivot transfers: Min assist General transfer comment: Assist for balance support and safety with power-up to full stand and transition to/from [facility] at bedside.  Ambulation/Gait General Gait Details: Not able to progress ambulation at this time due to HR    ADL: ADL Overall ADL's : Needs assistance/impaired Eating/Feeding: Minimal assistance, Sitting Grooming: Oral care, Wash/dry face, Wash/dry hands, Set up, Sitting Upper Body Bathing: Minimal assistance, Sitting Lower Body Bathing: Moderate assistance, Sit to/from stand, Cueing for compensatory techniques Upper Body Dressing : Minimal assistance, Sitting Lower Body Dressing: Moderate assistance, Sit to/from stand, Cueing for compensatory techniques Toilet Transfer: Minimal assistance, BSC, Stand-pivot, Cueing for sequencing Toileting- Clothing Manipulation and Hygiene: Moderate assistance, Sit to/from stand, Cueing for compensatory techniques Functional mobility during ADLs: Moderate assistance General ADL Comments: Pt still with flat affect but enjoys talking about fast and furious movies! He dons B socks from bed level with  set up A.  Cognition: Cognition Overall Cognitive Status: Impaired/Different from baseline Arousal/Alertness: Awake/alert Orientation Level: Oriented to person, Oriented to place, Oriented to time,  Disoriented to situation Attention: Focused, Sustained, Selective Focused Attention: Appears intact Sustained Attention: Appears intact Selective Attention: Appears intact Memory: Impaired Memory Impairment: Retrieval deficit, Decreased short term memory Decreased Short Term Memory: Verbal basic Awareness: Impaired Awareness Impairment: Intellectual impairment Problem Solving: Impaired Problem Solving Impairment: Verbal basic Safety/Judgment: Appears intact Cognition Arousal/Alertness: Awake/alert Behavior During Therapy: Flat affect, Impulsive Overall Cognitive Status: Impaired/Different from baseline Area of Impairment: Attention, Following commands, Awareness, Problem solving, Safety/judgement Current Attention Level: Sustained Following Commands: Follows one step commands consistently, Follows multi-step commands inconsistently Safety/Judgement: Decreased awareness of safety, Decreased awareness of deficits Awareness: Emergent Problem Solving: Slow processing, Difficulty sequencing, Requires verbal cues General Comments: Oriented, however poor insight to his current situation and level of function.   Physical Exam: Blood pressure (!) 126/91, pulse 99, temperature 98.1 F (36.7 C), temperature source Oral, resp. rate 20, height 5\' 7"  (1.702 m), weight 71.1 kg, SpO2 100 %. General: Uncle at bedside, No apparent distress HEENT: Cranioplasty site clean and dry Neck: Supple without JVD or lymphadenopathy Heart: Reg rate and rhythm. No murmurs rubs or gallops Chest: CTA bilaterally without wheezes, rales, or rhonchi; no distress Abdomen: Staples over abdominal site- C/D/I Extremities: No clubbing, cyanosis, or edema. Pulses are 2+ Skin: Cranioplasty and abdominal sites clean and dry Neuro: Patient is alert no acute distress.  Makes eye contact with examiner.  He does answer some basic questions.  Delayed responses. Follows simple demonstrated commands. RUE 4/5 strength, otherwise  5/5 Psych: Pt's affect is flat. Pt is cooperative    No results found for this or any previous visit (from the past 48 hour(s)). No results found.  Medical Problem List and Plan: 1.  Decreased functional mobility with altered mental status secondary to syndrome of Triphined.  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 Arnoldo Morale.  -patient may not shower  -ELOS/Goals: minA in 10-14 days 2.  Antithrombotics: -DVT/anticoagulation: Lovenox initiated 12/02/2019.  -antiplatelet therapy: N/A 3. Pain Management: Topamax 75 mg twice daily, hydrocodone/Robaxin as needed. Well controlled at this time.  4. Mood: Advised emotional support  -antipsychotic agents: N/A 5. Neuropsych: This patient is capable of making decisions on his own behalf. 6. Skin/Wound Care: Wound care of abdominal and cranial sites 7. Fluids/Electrolytes/Nutrition: Routine in and outs with follow-up chemistries 8.  Seizure prophylaxis.  Keppra 500 mg twice daily 9.  Urinary retention.  Urecholine 10 mg 3 times daily.  Check PVR 10.  Constipation.  MiraLAX daily. Colace 100mg  BID.   Lavon Paganini Angiulli, PA-C 12/04/2019   I have personally performed a face to face diagnostic evaluation, including, but not limited to relevant history and physical exam findings, of this patient and developed relevant assessment and plan.  Additionally, I have reviewed and concur with the physician assistant's documentation above.  Leeroy Cha, MD

## 2019-12-04 NOTE — Progress Notes (Signed)
Inpatient Rehabilitation  Patient information reviewed and entered into eRehab system by Cashae Weich M. Bronda Alfred, M.A., CCC/SLP, PPS Coordinator.  Information including medical coding, functional ability and quality indicators will be reviewed and updated through discharge.    

## 2019-12-04 NOTE — Progress Notes (Signed)
Pt calling out help me, help me.  Translator activated and patient wanting something to help him sleep.  Oncall paged for orders.  Dr. Truett Mainland called back with benadryl 50mg  PO.  Will continue to monitor.

## 2019-12-04 NOTE — Anesthesia Postprocedure Evaluation (Signed)
Anesthesia Post Note  Patient: Jason Skinner  Procedure(s) Performed: REMOVAL OF CRANIAL BONE FLAP FROM ABDOMEN WITH CRANIOTOMY FOR IMPLANTATION OF BONE FLAP (N/A Head)     Patient location during evaluation: PACU Anesthesia Type: General Level of consciousness: awake and alert Pain management: pain level controlled Vital Signs Assessment: post-procedure vital signs reviewed and stable Respiratory status: spontaneous breathing, nonlabored ventilation, respiratory function stable and patient connected to nasal cannula oxygen Cardiovascular status: blood pressure returned to baseline and stable Postop Assessment: no apparent nausea or vomiting Anesthetic complications: no   No complications documented.  Last Vitals:  Vitals:   12/04/19 0357 12/04/19 0802  BP: 108/84 (!) 126/91  Pulse: 92 99  Resp: 18 20  Temp: 36.8 C 36.7 C  SpO2: 98% 100%    Last Pain:  Vitals:   12/04/19 0919  TempSrc:   PainSc: [place]

## 2019-12-04 NOTE — Progress Notes (Signed)
Inpatient Rehab Admissions:  Inpatient Rehab Consult received.  Used stratus, and in person interpreters for conversation.   I met with patient and his brother at the bedside for rehabilitation assessment and to discuss goals and expectations of an inpatient rehab admission.  Pt's brother states family is hopeful pt can return to CIR to get back to the same level of function he was after his last admit.  Pt's family can work out 24/7 for when pt discharges rehab.  Discussed with Bronson Curb, RN CM, who states worker's comp states no prior auth needed for CIR.  Contact is Coralyn Mark at [phone].  Viona Gilmore, NP, in agreement for pt to admit to CIR today and I have a bed available.  Pt/family in agreement.  TOC aware.    Signed: Shann Medal, PT, DPT Admissions Coordinator [phone] 12/04/19  2:32 PM

## 2019-12-04 NOTE — Progress Notes (Signed)
Jason Ribas, MD  Physician  Physical Medicine and Rehabilitation  Consult Note     Signed  Date of Service:  12/04/2019 7:35 AM      Related encounter: ED to Hosp-Admission (Current) from 11/23/2019 in [facility]      Signed      Expand All Collapse All  Show:Clear all [x] Manual[x] Template[] Copied  Added by: [x] Angiulli, Lavon Paganini, PA-C[x] Raulkar, Clide Deutscher, MD  [] Hover for details      Physical Medicine and Rehabilitation Consult Reason for Consult: Decreased functional mobility Referring Physician: Dr. Arnoldo Morale   HPI: Jason Skinner is a 29 y.o. 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  Per chart review patient lives with friends.  He has a brother in the area.  Presented 11/23/2019 with noted decrease in mobility and altered mental status as well as increase headache.  Admission chemistries potassium 3.2 glucose 109, alcohol negative, urine drug screen negative, lactic acid 1.2, urine culture less than 10,000 and significant growth.  Cranial CT scan showed interval increase in mass-effect and despite the large craniectomy defect on the right, the right cerebrum, principally the right frontal lobe was flattened and compressed with midline shift to the left of 1.5 cm.  No new acute intracranial hemorrhage noted.  Patient was seen by neurosurgery for syndrome of the trephined.  Patient underwent retrieval craniectomy flap from the abdomen cranioplasty 11/28/2019 per Dr. Arnoldo Morale.  Maintained on Keppra for seizure prophylaxis.  He was  cleared to begin Lovenox for DVT prophylaxis 12/02/2019.  Therapy evaluations completed with recommendations of physical medicine rehab consult.   Review of Systems  Constitutional: Positive for malaise/fatigue. Negative for chills and fever.  HENT: Negative for hearing loss.   Eyes: Negative for blurred vision and double vision.  Respiratory: Negative for shortness of breath.   Cardiovascular: Negative for chest pain, palpitations and leg swelling.  Gastrointestinal: Positive for constipation. Negative for heartburn, nausea and vomiting.  Genitourinary: Negative for dysuria, flank pain and hematuria.  Neurological: Positive for dizziness and headaches.  Psychiatric/Behavioral: The patient has insomnia.   All other systems reviewed and are negative.  History reviewed. No pertinent past medical history.      Past Surgical History:  Procedure Laterality Date  . CRANIOTOMY Right 09/05/2019   Procedure: CRANIOTOMY HEMATOMA EVACUATION SUBDURAL WITH  PLACEMENT OF BONE FLAP IN ABDOMEN.;  Surgeon: Newman Pies, MD;  Location: [facility];  Service: Neurosurgery;  Laterality: Right;  right  . CRANIOTOMY N/A 11/28/2019   Procedure: REMOVAL OF CRANIAL BONE FLAP FROM ABDOMEN WITH CRANIOTOMY FOR IMPLANTATION OF BONE FLAP;  Surgeon: Newman Pies, MD;  Location: [facility];  Service: Neurosurgery;  Laterality: N/A;   History reviewed. No pertinent family history. Social History:  reports that he has never smoked. He has never used smokeless tobacco. He reports previous alcohol use. He reports previous drug use. Allergies: No Known Allergies       Medications Prior to Admission  Medication Sig Dispense Refill  . ondansetron (ZOFRAN) 4 MG tablet Take 1 tablet (4 mg total) by mouth daily as needed for nausea or vomiting. 30 tablet 1  . topiramate (TOPAMAX) 25 MG tablet Take  3 tablets (75 mg total) by mouth 2 (two) times daily. 60 tablet 0  . acetaminophen (TYLENOL) 500 MG tablet Take 2 tablets (1,000  mg total) by mouth every 6 (six) hours as needed for headache. (Patient not taking: Reported on 11/23/2019) 30 tablet 0  . bethanechol (URECHOLINE) 10 MG tablet Take 1 tablet (10 mg total) by mouth 3 (three) times daily. (Patient not taking: Reported on 11/23/2019) 15 tablet 0  . lidocaine (LIDODERM) 5 % Place 1 patch onto the skin daily. Remove & Discard patch within 12 hours or as directed by MD (Patient not taking: Reported on 11/23/2019) 30 patch 0  . meclizine (ANTIVERT) 12.5 MG tablet Take 1 tablet (12.5 mg total) by mouth 3 (three) times daily as needed for dizziness. (Patient not taking: Reported on 11/23/2019) 30 tablet 0  . methocarbamol (ROBAXIN) 500 MG tablet Take 1 tablet (500 mg total) by mouth every 8 (eight) hours as needed for muscle spasms. (Patient not taking: Reported on 11/23/2019) 60 tablet 0  . polyethylene glycol (MIRALAX / GLYCOLAX) 17 g packet Take 17 g by mouth daily. 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 Brother is here.   Lives With: Family  Functional History: Prior Function Level of Independence: Needs assistance Gait / Transfers Assistance Needed: family member in room states pt was walking indoors without assist after last surgery and before this surgery. ADL's / Homemaking Assistance  Needed: Pt could do own self care.  Had assist for home tasks. Comments: wife and 2 daughters in [place] Functional Status:  Mobility: Bed Mobility Overal bed mobility: Needs Assistance Bed Mobility: Supine to Sit, Sit to Supine Supine to sit: Min guard Sit to supine: Min guard General bed mobility comments: Pt reaching out for therapist's hand to pull up into sitting. Redirected him to use the railing for support and pt was able to transition to full sitting position without assist. Pt required assist from therapist more for management of lines as pt with no attention to lines and getting feet tangled up in condom cath line as well as leads to telemetry box Transfers Overall transfer level: Needs assistance Equipment used: 1 person hand held assist Transfers: Sit to/from Stand, Stand Pivot Transfers Sit to Stand: Min assist Stand pivot transfers: Min assist General transfer comment: Assist for balance support and safety with power-up to full stand and transition to/from [facility] at bedside.  Ambulation/Gait General Gait Details: Not able to progress ambulation at this time due to HR  ADL: ADL Overall ADL's : Needs assistance/impaired Eating/Feeding: Minimal assistance, Sitting Grooming: Oral care, Wash/dry face, Wash/dry hands, Set up, Sitting Upper Body Bathing: Minimal assistance, Sitting Lower Body Bathing: Moderate assistance, Sit to/from stand, Cueing for compensatory techniques Upper Body Dressing : Minimal assistance, Sitting Lower Body Dressing: Moderate assistance, Sit to/from stand, Cueing for compensatory techniques Toilet Transfer: Minimal assistance, BSC, Stand-pivot, Cueing for  sequencing Toileting- Clothing Manipulation and Hygiene: Moderate assistance, Sit to/from stand, Cueing for compensatory techniques Functional mobility during ADLs: Moderate assistance General ADL Comments: Pt still with flat affect but enjoys talking about fast and furious movies! He dons B socks  from bed level with set up A.  Cognition: Cognition Overall Cognitive Status: Impaired/Different from baseline Arousal/Alertness: Awake/alert Orientation Level: Oriented X4 Attention: Focused, Sustained, Selective Focused Attention: Appears intact Sustained Attention: Appears intact Selective Attention: Appears intact Memory: Impaired Memory Impairment: Retrieval deficit, Decreased short term memory Decreased Short Term Memory: Verbal basic Awareness: Impaired Awareness Impairment: Intellectual impairment Problem Solving: Impaired Problem Solving Impairment: Verbal basic Safety/Judgment: Appears intact Cognition Arousal/Alertness: Awake/alert Behavior During Therapy: Flat affect, Impulsive Overall Cognitive Status: Impaired/Different from baseline Area of Impairment: Attention, Following commands, Awareness, Problem solving, Safety/judgement Current Attention Level: Sustained Following Commands: Follows one step commands consistently, Follows multi-step commands inconsistently Safety/Judgement: Decreased awareness of safety, Decreased awareness of deficits Awareness: Emergent Problem Solving: Slow processing, Difficulty sequencing, Requires verbal cues General Comments: Oriented, however poor insight to his current situation and level of function.   Blood pressure 108/84, pulse 92, temperature 98.2 F (36.8 C), temperature source Oral, resp. rate 18, height 5\' 7"  (1.702 m), weight 71.1 kg, SpO2 98 %.  Physical Exam General: Uncle at bedside, No apparent distress HEENT: Cranioplasty site clean and dry Neck: Supple without JVD or lymphadenopathy Heart: Tachycardic. No murmurs rubs or gallops Chest: CTA bilaterally without wheezes, rales, or rhonchi; no distress Abdomen: Soft, non-tender, non-distended, bowel sounds positive. Extremities: No clubbing, cyanosis, or edema. Pulses are 2+ Skin: Cranioplasty site clean and dry Neuro: Patient is alert.  He will answer basic  questions.  There is a language barrier.  Follows simple demonstrated commands.  Musculoskeletal: Full ROM, No pain with AROM or PROM in the neck, trunk, or extremities. Posture appropriate Psych: Pt's affect is appropriate. Pt is cooperative   Lab Results Last 24 Hours  No results found for this or any previous visit (from the past 24 hour(s)).   Imaging Results (Last 48 hours)  No results found.     Assessment/Plan: Diagnosis: s/p craniectomy following syndrome of the trephined 1. Does the need for close, 24 hr/day medical supervision in concert with the patient's rehab needs make it unreasonable for this patient to be served in a less intensive setting? Yes 2. Co-Morbidities requiring supervision/potential complications: syndrome  3. Due to bladder management, bowel management, safety, skin/wound care, disease management, medication administration, pain management and patient education, does the patient require 24 hr/day rehab nursing? Yes 4. Does the patient require coordinated care of a physician, rehab nurse, therapy disciplines of PT, OT, SLP to address physical and functional deficits in the context of the above medical diagnosis(es)? Yes Addressing deficits in the following areas: balance, endurance, locomotion, strength, transferring, bowel/bladder control, bathing, dressing, feeding, grooming, toileting, cognition, speech, language and psychosocial support 5. Can the patient actively participate in an intensive therapy program of at least 3 hrs of therapy per day at least 5 days per week? Yes 6. The potential for patient to make measurable gains while on inpatient rehab is excellent 7. Anticipated functional outcomes upon discharge from inpatient rehab are min assist  with PT, min assist with OT, min assist with SLP. 8. Estimated rehab length of stay to reach the above functional goals is: 10-14 days 9. Anticipated discharge destination: Home 10. Overall Rehab/Functional  Prognosis: excellent  RECOMMENDATIONS: This patient's condition is appropriate for continued rehabilitative care in the following  setting: CIR Patient has agreed to participate in recommended program. Yes Note that insurance prior authorization may be required for reimbursement for recommended care.  Comment: Thank you for this consult. Admission coordinator to follow.   I have personally performed a face to face diagnostic evaluation, including, but not limited to relevant history and physical exam findings, of this patient and developed relevant assessment and plan.  Additionally, I have reviewed and concur with the physician assistant's documentation above.  Leeroy Cha, MD  Cathlyn Parsons, PA-C 12/04/2019        Revision History                Routing History           Note Details  Author Jason Ribas, MD File Time 12/04/2019 1:12 PM  Author Type Physician Status Signed  Last Editor Jason Ribas, MD Service Physical Medicine and Rehabilitation

## 2019-12-04 NOTE — Consult Note (Signed)
Physical Medicine and Rehabilitation Consult Reason for Consult: Decreased functional mobility Referring Physician: Dr. Arnoldo Morale   HPI: Jason Skinner is a 29 y.o. 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  Per chart review patient lives with friends.  He has a brother in the area.  Presented 11/23/2019 with noted decrease in mobility and altered mental status as well as increase headache.  Admission chemistries potassium 3.2 glucose 109, alcohol negative, urine drug screen negative, lactic acid 1.2, urine culture less than 10,000 and significant growth.  Cranial CT scan showed interval increase in mass-effect and despite the large craniectomy defect on the right, the right cerebrum, principally the right frontal lobe was flattened and compressed with midline shift to the left of 1.5 cm.  No new acute intracranial hemorrhage noted.  Patient was seen by neurosurgery for syndrome of the trephined.  Patient underwent retrieval craniectomy flap from the abdomen cranioplasty 11/28/2019 per Dr. Arnoldo Morale.  Maintained on Keppra for seizure prophylaxis.  He was cleared to begin Lovenox for DVT prophylaxis 12/02/2019.  Therapy evaluations completed with recommendations of physical medicine rehab consult.   Review of Systems  Constitutional: Positive for malaise/fatigue. Negative for chills and fever.  HENT: Negative for hearing loss.   Eyes: Negative for blurred vision and double vision.  Respiratory: Negative for shortness of breath.   Cardiovascular: Negative for chest pain, palpitations and  leg swelling.  Gastrointestinal: Positive for constipation. Negative for heartburn, nausea and vomiting.  Genitourinary: Negative for dysuria, flank pain and hematuria.  Neurological: Positive for dizziness and headaches.  Psychiatric/Behavioral: The patient has insomnia.   All other systems reviewed and are negative.  History reviewed. No pertinent past medical history. Past Surgical History:  Procedure Laterality Date  . CRANIOTOMY Right 09/05/2019   Procedure: CRANIOTOMY HEMATOMA EVACUATION SUBDURAL WITH  PLACEMENT OF BONE FLAP IN ABDOMEN.;  Surgeon: Newman Pies, MD;  Location: [facility];  Service: Neurosurgery;  Laterality: Right;  right  . CRANIOTOMY N/A 11/28/2019   Procedure: REMOVAL OF CRANIAL BONE FLAP FROM ABDOMEN WITH CRANIOTOMY FOR IMPLANTATION OF BONE FLAP;  Surgeon: Newman Pies, MD;  Location: [facility];  Service: Neurosurgery;  Laterality: N/A;   History reviewed. No pertinent family history. Social History:  reports that he has never smoked. He has never used smokeless tobacco. He reports previous alcohol use. He reports previous drug use. Allergies: No Known Allergies Medications Prior to Admission  Medication Sig Dispense Refill  . ondansetron (ZOFRAN) 4 MG tablet Take 1 tablet (4 mg total) by mouth daily as needed for nausea or vomiting. 30 tablet 1  . topiramate (TOPAMAX) 25 MG tablet Take 3 tablets (75 mg total) by mouth 2 (two) times daily. 60 tablet 0  . acetaminophen (TYLENOL) 500 MG tablet Take 2 tablets (1,000 mg total) by mouth every 6 (six) hours as needed for headache. (Patient not taking: Reported on 11/23/2019) 30 tablet 0  . bethanechol (URECHOLINE) 10 MG tablet Take 1 tablet (10 mg total) by mouth 3 (three) times daily. (Patient not taking: Reported on 11/23/2019) 15 tablet 0  . lidocaine (LIDODERM) 5 % Place 1 patch onto the skin daily. Remove & Discard patch  within 12 hours or as directed by MD (Patient not taking: Reported on 11/23/2019) 30 patch 0  .  meclizine (ANTIVERT) 12.5 MG tablet Take 1 tablet (12.5 mg total) by mouth 3 (three) times daily as needed for dizziness. (Patient not taking: Reported on 11/23/2019) 30 tablet 0  . methocarbamol (ROBAXIN) 500 MG tablet Take 1 tablet (500 mg total) by mouth every 8 (eight) hours as needed for muscle spasms. (Patient not taking: Reported on 11/23/2019) 60 tablet 0  . polyethylene glycol (MIRALAX / GLYCOLAX) 17 g packet Take 17 g by mouth daily. 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 Brother is here.   Lives With: Family  Functional History: Prior Function Level of Independence: Needs assistance Gait / Transfers Assistance Needed: family member in room states pt was walking indoors without assist after last surgery and before this surgery. ADL's / Homemaking Assistance Needed: Pt could do own self care.  Had assist for home tasks. Comments: wife and 2 daughters in [place] Functional Status:  Mobility: Bed Mobility Overal bed mobility: Needs Assistance Bed Mobility: Supine to Sit, Sit to Supine Supine to sit: Min guard Sit to supine: Min guard General bed mobility comments: Pt reaching out for therapist's hand to pull up into sitting. Redirected him to use the railing for support and pt was able to transition to full  sitting position without assist. Pt required assist from therapist more for management of lines as pt with no attention to lines and getting feet tangled up in condom cath line as well as leads to telemetry box Transfers Overall transfer level: Needs assistance Equipment used: 1 person hand held assist Transfers: Sit to/from Stand, Stand Pivot Transfers Sit to Stand: Min assist Stand pivot transfers: Min assist General transfer comment: Assist for balance support and safety with power-up to full stand and transition to/from [facility] at bedside.  Ambulation/Gait General Gait Details: Not able to progress ambulation at this time due to HR    ADL: ADL Overall ADL's : Needs assistance/impaired Eating/Feeding: Minimal assistance, Sitting Grooming: Oral care, Wash/dry face, Wash/dry hands, Set up, Sitting Upper Body Bathing: Minimal assistance, Sitting Lower Body Bathing: Moderate assistance, Sit to/from stand, Cueing for compensatory techniques Upper Body Dressing : Minimal assistance, Sitting Lower Body Dressing: Moderate assistance, Sit to/from stand, Cueing for compensatory techniques Toilet Transfer: Minimal assistance, BSC, Stand-pivot, Cueing for sequencing Toileting- Clothing Manipulation and Hygiene: Moderate assistance, Sit to/from stand, Cueing for compensatory techniques Functional mobility during ADLs: Moderate assistance General ADL Comments: Pt still with flat affect but enjoys talking about fast and furious movies! He dons B socks from bed level with set up A.  Cognition: Cognition Overall Cognitive Status: Impaired/Different from baseline Arousal/Alertness: Awake/alert Orientation Level: Oriented X4 Attention: Focused, Sustained, Selective Focused Attention: Appears intact Sustained Attention: Appears intact Selective Attention: Appears intact Memory: Impaired Memory Impairment: Retrieval deficit, Decreased short term memory  Decreased Short Term Memory: Verbal  basic Awareness: Impaired Awareness Impairment: Intellectual impairment Problem Solving: Impaired Problem Solving Impairment: Verbal basic Safety/Judgment: Appears intact Cognition Arousal/Alertness: Awake/alert Behavior During Therapy: Flat affect, Impulsive Overall Cognitive Status: Impaired/Different from baseline Area of Impairment: Attention, Following commands, Awareness, Problem solving, Safety/judgement Current Attention Level: Sustained Following Commands: Follows one step commands consistently, Follows multi-step commands inconsistently Safety/Judgement: Decreased awareness of safety, Decreased awareness of deficits Awareness: Emergent Problem Solving: Slow processing, Difficulty sequencing, Requires verbal cues General Comments: Oriented, however poor insight to his current situation and level of function.   Blood pressure 108/84, pulse 92, temperature 98.2 F (36.8 C), temperature source Oral, resp. rate 18, height 5\' 7"  (1.702 m), weight 71.1 kg, SpO2 98 %.  Physical Exam General: Uncle at bedside, No apparent distress HEENT: Cranioplasty site clean and dry Neck: Supple without JVD or lymphadenopathy Heart: Tachycardic. No murmurs rubs or gallops Chest: CTA bilaterally without wheezes, rales, or rhonchi; no distress Abdomen: Soft, non-tender, non-distended, bowel sounds positive. Extremities: No clubbing, cyanosis, or edema. Pulses are 2+ Skin: Cranioplasty site clean and dry Neuro: Patient is alert.  He will answer basic questions.  There is a language barrier.  Follows simple demonstrated commands.  Musculoskeletal: Full ROM, No pain with AROM or PROM in the neck, trunk, or extremities. Posture appropriate Psych: Pt's affect is appropriate. Pt is cooperative   No results found for this or any previous visit (from the past 24 hour(s)). No results found.   Assessment/Plan: Diagnosis: s/p craniectomy following syndrome of the trephined 1. Does the need for close,  24 hr/day medical supervision in concert with the patient's rehab needs make it unreasonable for this patient to be served in a less intensive setting? Yes 2. Co-Morbidities requiring supervision/potential complications: syndrome  3. Due to bladder management, bowel management, safety, skin/wound care, disease management, medication administration, pain management and patient education, does the patient require 24 hr/day rehab nursing? Yes 4. Does the patient require coordinated care of a physician, rehab nurse, therapy disciplines of PT, OT, SLP to address physical and functional deficits in the context of the above medical diagnosis(es)? Yes Addressing deficits in the following areas: balance, endurance, locomotion, strength, transferring, bowel/bladder control, bathing, dressing, feeding, grooming, toileting, cognition, speech, language and psychosocial support 5. Can the patient actively participate in an intensive therapy program of at least 3 hrs of therapy per day at least 5 days per week? Yes 6. The potential for patient to make measurable gains while on inpatient rehab is excellent 7. Anticipated functional outcomes upon discharge from inpatient rehab are min assist  with PT, min assist with OT, min assist with SLP. 8. Estimated rehab length of stay to reach the above functional goals is: 10-14 days 9. Anticipated discharge destination: Home 10. Overall Rehab/Functional Prognosis: excellent  RECOMMENDATIONS: This patient's condition is appropriate for continued rehabilitative care in the following setting: CIR Patient has agreed to participate in recommended program. Yes Note that insurance prior authorization may be required for reimbursement for recommended care.  Comment: Thank you for this consult. Admission coordinator to follow.   I have personally performed a face to face diagnostic evaluation, including, but not limited to relevant history and physical exam findings, of this  patient and developed relevant assessment and plan.  Additionally, I have reviewed and concur with the physician assistant's documentation above.  Leeroy Cha, MD  Lavon Paganini [place], PA-C 12/04/2019

## 2019-12-04 NOTE — Progress Notes (Signed)
Subjective: The patient is alert and attentive.  He will answer simple questions.  There is a language barrier.  He is in no apparent distress.  Objective: Vital signs in last 24 hours: Temp:  [97.8 F (36.6 C)-98.6 F (37 C)] 98.2 F (36.8 C) (09/28 0357) Pulse Rate:  [92-119] 92 (09/28 0357) Resp:  [16-20] 18 (09/28 0357) BP: (97-116)/(66-84) 108/84 (09/28 0357) SpO2:  [98 %-100 %] 98 % (09/28 0357) Estimated body mass index is 24.55 kg/m as calculated from the following:   Height as of this encounter: 5\' 7"  (1.702 m).   Weight as of this encounter: 71.1 kg.   Intake/Output from previous day: 09/27 0701 - 09/28 0700 In: 1440 [P.O.:1440] Out: 1800 [Urine:1800] Intake/Output this shift: No intake/output data recorded.  Physical exam the patient is alert and attentive.  He answers questions and follows commands.  His cranioplasty incision is healing well.  Lab Results: No results for input(s): WBC, HGB, HCT, PLT in the last 72 hours. BMET No results for input(s): NA, K, CL, CO2, GLUCOSE, BUN, CREATININE, CALCIUM in the last 72 hours.  Studies/Results: No results found.  Assessment/Plan: Status post cranioplasty: The patient is doing well 6 days status post his cranioplasty.  We are awaiting rehab placement.  LOS: 11 days     Ophelia Charter 12/04/2019, 7:26 AM      Patient ID: Jason Skinner, male   DOB: [birth year], 29 y.o.   MRN: [id number]

## 2019-12-04 NOTE — Progress Notes (Signed)
Inpatient Rehabilitation Medication Review by a Pharmacist  A complete drug regimen review was completed for this patient to identify any potential clinically significant medication issues.  Clinically significant medication issues were identified:  No.  Check AMION for pharmacist assigned to patient if future medication questions/issues arise during this admission.  Time spent performing this drug regimen review (minutes):  10 mins.   [name] 12/04/2019 9:53 PM

## 2019-12-04 NOTE — H&P (Signed)
Physical Medicine and Rehabilitation Admission H&P  CC: Syndrome of the trephined   HPI: Jason Skinner is a 29 year old limited English speaking right-handed male well-known to rehab services from admission 09/20/2019 to 10/05/2019 after 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  He was discharged to home ambulating 150 feet without assistance.  Per chart review lives with friends has a brother and family in the area.  Presented 11/23/2019 with noted decrease in mobility altered mental status as well as increase headache.  Admission chemistry potassium 3.2 glucose 109 alcohol negative urine drug screen negative lactic acid 1.2 urine culture less than 10,000 insignificant growth.  Cranial CT scan showed interval increase in mass-effect and despite the large craniectomy defect on the right.  The right cerebrum, principally the right frontal lobe is flattened and compressed with midline shift to the left of 1.5 cm.  No new intracranial hemorrhage noted.  Patient was seen by neurosurgery for syndrome of the Trephined.  Patient underwent retrieval craniectomy flap from the abdomen cranioplasty 11/28/2019 per Dr. Arnoldo Morale.  Maintained on Keppra for seizure prophylaxis.  He was cleared for Lovenox for DVT prophylaxis 12/02/2019.  Patient did have some urinary retention placed on low-dose Urecholine.  Tolerating a regular diet.  Therapy evaluations completed and patient was admitted for a comprehensive rehab program.  Review of Systems  Constitutional: Positive for malaise/fatigue. Negative for chills and fever.  HENT: Negative for hearing loss.   Eyes: Negative for blurred vision and double vision.  Respiratory: Negative for cough and shortness of breath.   Cardiovascular: Negative for chest  pain, palpitations and leg swelling.  Gastrointestinal: Positive for constipation. Negative for heartburn, nausea and vomiting.  Genitourinary: Negative for dysuria and hematuria.  Musculoskeletal: Positive for myalgias.  Skin: Negative for rash.  Neurological: Positive for dizziness and headaches.  Psychiatric/Behavioral: The patient has insomnia.   All other systems reviewed and are negative.  History reviewed. No pertinent past medical history. Past Surgical History:  Procedure Laterality Date  . CRANIOTOMY Right 09/05/2019   Procedure: CRANIOTOMY HEMATOMA EVACUATION SUBDURAL WITH  PLACEMENT OF BONE FLAP IN ABDOMEN.;  Surgeon: Newman Pies, MD;  Location: [place] [facility];  Service: Neurosurgery;  Laterality: Right;  right  . CRANIOTOMY N/A 11/28/2019   Procedure: REMOVAL OF CRANIAL BONE FLAP FROM ABDOMEN WITH CRANIOTOMY FOR IMPLANTATION OF BONE FLAP;  Surgeon: Newman Pies, MD;  Location: [facility];  Service: Neurosurgery;  Laterality: N/A;   History reviewed. No pertinent family history. Social History:  reports that he has never smoked. He has never used smokeless tobacco. He reports previous alcohol use. He reports previous drug use. Allergies: No Known Allergies Medications Prior to Admission  Medication Sig Dispense Refill  . acetaminophen (TYLENOL) 500 MG tablet Take 2 tablets (1,000 mg total) by mouth every 6 (six) hours as needed for headache. (Patient not taking: Reported on 11/23/2019) 30 tablet 0  . HYDROcodone-acetaminophen (NORCO/VICODIN) 5-325 MG tablet Take 1 tablet by mouth every 4 (four) hours as needed for moderate pain. 30 tablet 0  . levETIRAcetam (KEPPRA) 500 MG tablet Take 1 tablet (500 mg total) by mouth 2 (two) times daily. 60 tablet 1  . meclizine (ANTIVERT) 12.5 MG tablet Take 1 tablet (12.5 mg total) by mouth 3 (three) times daily as needed for dizziness. (Patient not taking: Reported on 11/23/2019) 30 tablet  0  . methocarbamol (ROBAXIN) 500 MG tablet Take 1 tablet (500  mg total) by mouth every 8 (eight) hours as needed for muscle spasms. (Patient not taking: Reported on 11/23/2019) 60 tablet 0  . ondansetron (ZOFRAN) 4 MG tablet Take 1 tablet (4 mg total) by mouth daily as needed for nausea or vomiting. 30 tablet 1  . polyethylene glycol (MIRALAX / GLYCOLAX) 17 g packet Take 17 g by mouth daily. (Patient not taking: Reported on 11/23/2019) 14 each 0  . topiramate (TOPAMAX) 25 MG tablet Take 3 tablets (75 mg total) by mouth 2 (two) times daily. 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 Brother is here.   Lives With: Family   Functional History: Prior Function Level of Independence: Needs assistance Gait / Transfers Assistance Needed: family member in room states pt was walking indoors without assist after last surgery and before this surgery. ADL's / Homemaking Assistance Needed: Pt could do own self care.  Had assist for home tasks. Comments: wife and 2 daughters in [place]  Functional Status:  Mobility: Bed Mobility Overal bed mobility: Needs Assistance Bed Mobility: Supine to Sit, Sit to Supine Supine to sit: Min guard Sit to supine: Min guard General bed mobility comments: Pt reaching out for therapist's hand  to pull up into sitting. Redirected him to use the railing for support and pt was able to transition to full sitting position without assist. Pt required assist from therapist more for management of lines as pt with no attention to lines and getting feet tangled up in condom cath line as well as leads to telemetry box Transfers Overall transfer level: Needs assistance Equipment used: 1 person hand held assist Transfers: Sit to/from Stand, Stand Pivot Transfers Sit to Stand: Min assist Stand pivot transfers: Min assist General transfer comment: Assist for balance support and safety with power-up to full stand and transition to/from [facility] at bedside.  Ambulation/Gait General Gait Details: Not able to progress ambulation at this time due to HR  ADL: ADL Overall ADL's : Needs assistance/impaired Eating/Feeding: Minimal assistance, Sitting Grooming: Oral care, Wash/dry face, Wash/dry hands, Set up, Sitting Upper Body Bathing: Minimal assistance, Sitting Lower Body Bathing: Moderate assistance, Sit to/from stand, Cueing for compensatory techniques Upper Body Dressing : Minimal assistance, Sitting Lower Body Dressing: Moderate assistance, Sit to/from stand, Cueing for compensatory techniques Toilet Transfer: Minimal assistance, BSC, Stand-pivot, Cueing for sequencing Toileting- Clothing Manipulation and Hygiene: Moderate assistance, Sit to/from stand, Cueing for compensatory techniques Functional mobility during ADLs: Moderate assistance General ADL Comments: Pt still with flat affect but enjoys talking about fast and furious movies! He dons B socks from bed level with set up A.  Cognition: Cognition Overall Cognitive Status: Impaired/Different from baseline Arousal/Alertness: Awake/alert Orientation Level: Oriented to person, Oriented to place, Oriented to time, Disoriented to situation Attention:  Focused, Sustained, Selective Focused Attention: Appears intact Sustained Attention: Appears  intact Selective Attention: Appears intact Memory: Impaired Memory Impairment: Retrieval deficit, Decreased short term memory Decreased Short Term Memory: Verbal basic Awareness: Impaired Awareness Impairment: Intellectual impairment Problem Solving: Impaired Problem Solving Impairment: Verbal basic Safety/Judgment: Appears intact Cognition Arousal/Alertness: Awake/alert Behavior During Therapy: Flat affect, Impulsive Overall Cognitive Status: Impaired/Different from baseline Area of Impairment: Attention, Following commands, Awareness, Problem solving, Safety/judgement Current Attention Level: Sustained Following Commands: Follows one step commands consistently, Follows multi-step commands inconsistently Safety/Judgement: Decreased awareness of safety, Decreased awareness of deficits Awareness: Emergent Problem Solving: Slow processing, Difficulty sequencing, Requires verbal cues General Comments: Oriented, however poor insight to his current situation and level of function.    Physical Exam: There were no vitals taken for this visit. General: Uncle at bedside, No apparent distress HEENT: Cranioplasty site clean and dry Neck: Supple without JVD or lymphadenopathy Heart: Reg rate and rhythm. No murmurs rubs or gallops Chest: CTA bilaterally without wheezes, rales, or rhonchi; no distress Abdomen: Staples over abdominal site- C/D/I Extremities: No clubbing, cyanosis, or edema. Pulses are 2+ Skin: Cranioplasty and abdominal sites clean and dry Neuro: Patient is alert no acute distress.  Makes eye contact with examiner.  He does answer some basic questions.  Delayed responses. Follows simple demonstrated commands. RUE 4/5 strength, otherwise 5/5 Psych: Pt's affect is flat. Pt is cooperative    No results found for this or any previous visit (from the past 48 hour(s)). No results found.  Medical Problem List and Plan: 1.  Decreased functional mobility with altered mental status  secondary to syndrome of Trephined.  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 Arnoldo Morale.  -patient may not shower  -ELOS/Goals: minA in 10-14 days 2.  Antithrombotics: -DVT/anticoagulation: Lovenox initiated 12/02/2019.  -antiplatelet therapy: N/A 3. Pain Management: Topamax 75 mg twice daily, hydrocodone/Robaxin as needed. Well controlled at this time.  4. Mood: Advised emotional support  -antipsychotic agents: N/A 5. Neuropsych: This patient is capable of making decisions on his own behalf. 6. Skin/Wound Care: Wound care of abdominal and cranial sites 7. Fluids/Electrolytes/Nutrition: Routine in and outs with follow-up chemistries 8.  Seizure prophylaxis.  Keppra 500 mg twice daily 9.  Urinary retention.  Urecholine 10 mg 3 times daily.  Check PVR 10.  Constipation.  MiraLAX daily. Colace 100mg  BID.   Lavon Paganini Angiulli, PA-C 12/04/2019    I have personally performed a face to face diagnostic evaluation, including, but not limited to relevant history and physical exam findings, of this patient and developed relevant assessment and plan.  Additionally, I have reviewed and concur with the physician assistant's documentation above.  The patient's status has not changed. The original post admission physician evaluation remains appropriate, and any changes from the pre-admission screening or documentation from the acute chart are noted above.   Leeroy Cha, MD

## 2019-12-05 ENCOUNTER — Inpatient Hospital Stay (HOSPITAL_COMMUNITY): Payer: Self-pay | Admitting: Speech Pathology

## 2019-12-05 ENCOUNTER — Inpatient Hospital Stay (HOSPITAL_COMMUNITY): Payer: Self-pay | Admitting: Occupational Therapy

## 2019-12-05 ENCOUNTER — Inpatient Hospital Stay (HOSPITAL_COMMUNITY): Payer: Self-pay | Admitting: Physical Therapy

## 2019-12-05 DIAGNOSIS — E871 Hypo-osmolality and hyponatremia: Secondary | ICD-10-CM

## 2019-12-05 DIAGNOSIS — S069X3S Unspecified intracranial injury with loss of consciousness of 1 hour to 5 hours 59 minutes, sequela: Secondary | ICD-10-CM

## 2019-12-05 DIAGNOSIS — K5901 Slow transit constipation: Secondary | ICD-10-CM

## 2019-12-05 DIAGNOSIS — R339 Retention of urine, unspecified: Secondary | ICD-10-CM

## 2019-12-05 LAB — CBC WITH DIFFERENTIAL/PLATELET
Abs Immature Granulocytes: 0.08 10*3/uL — ABNORMAL HIGH (ref 0.00–0.07)
Basophils Absolute: 0.1 10*3/uL (ref 0.0–0.1)
Basophils Relative: 1 %
Eosinophils Absolute: 0.1 10*3/uL (ref 0.0–0.5)
Eosinophils Relative: 1 %
HCT: 39.4 % (ref 39.0–52.0)
Hemoglobin: 13.8 g/dL (ref 13.0–17.0)
Immature Granulocytes: 1 %
Lymphocytes Relative: 19 %
Lymphs Abs: 2.2 10*3/uL (ref 0.7–4.0)
MCH: 30.2 pg (ref 26.0–34.0)
MCHC: 35 g/dL (ref 30.0–36.0)
MCV: 86.2 fL (ref 80.0–100.0)
Monocytes Absolute: 0.7 10*3/uL (ref 0.1–1.0)
Monocytes Relative: 6 %
Neutro Abs: 8.2 10*3/uL — ABNORMAL HIGH (ref 1.7–7.7)
Neutrophils Relative %: 72 %
Platelets: 616 10*3/uL — ABNORMAL HIGH (ref 150–400)
RBC: 4.57 MIL/uL (ref 4.22–5.81)
RDW: 13 % (ref 11.5–15.5)
WBC: 11.3 10*3/uL — ABNORMAL HIGH (ref 4.0–10.5)
nRBC: 0 % (ref 0.0–0.2)

## 2019-12-05 LAB — COMPREHENSIVE METABOLIC PANEL
ALT: 18 U/L (ref 0–44)
AST: 15 U/L (ref 15–41)
Albumin: 4.3 g/dL (ref 3.5–5.0)
Alkaline Phosphatase: 79 U/L (ref 38–126)
Anion gap: 11 (ref 5–15)
BUN: 5 mg/dL — ABNORMAL LOW (ref 6–20)
CO2: 20 mmol/L — ABNORMAL LOW (ref 22–32)
Calcium: 10 mg/dL (ref 8.9–10.3)
Chloride: 100 mmol/L (ref 98–111)
Creatinine, Ser: 0.66 mg/dL (ref 0.61–1.24)
GFR calc Af Amer: >60 mL/min (ref >60)
GFR calc non Af Amer: >60 mL/min (ref >60)
Glucose, Bld: 114 mg/dL — ABNORMAL HIGH (ref 70–99)
Potassium: 3.4 mmol/L — ABNORMAL LOW (ref 3.5–5.1)
Sodium: 131 mmol/L — ABNORMAL LOW (ref 135–145)
Total Bilirubin: 0.7 mg/dL (ref 0.3–1.2)
Total Protein: 8.1 g/dL (ref 6.5–8.1)

## 2019-12-05 NOTE — Evaluation (Signed)
Occupational Therapy Assessment and Plan  Patient Details  Name: Jason Skinner MRN: [id number] Date of Birth: 02/19/1991  OT Diagnosis: ataxia, cognitive deficits, disturbance of vision and muscle weakness (generalized) Rehab Potential: Rehab Potential (ACUTE ONLY): Good ELOS: 10-12 days   Today's Date: 12/05/2019 OT Individual Time: 1100-1200 OT Individual Time Calculation (min): 60 min     Hospital Problem: Principal Problem:   TBI (traumatic brain injury) ([place])   Past Medical History: History reviewed. No pertinent past medical history. Past Surgical History:  Past Surgical History:  Procedure Laterality Date  . CRANIOTOMY Right 09/05/2019   Procedure: CRANIOTOMY HEMATOMA EVACUATION SUBDURAL WITH  PLACEMENT OF BONE FLAP IN ABDOMEN.;  Surgeon: Newman Pies, MD;  Location: [facility];  Service: Neurosurgery;  Laterality: Right;  right  . CRANIOTOMY N/A 11/28/2019   Procedure: REMOVAL OF CRANIAL BONE FLAP FROM ABDOMEN WITH CRANIOTOMY FOR IMPLANTATION OF BONE FLAP;  Surgeon: Newman Pies, MD;  Location: [facility];  Service: Neurosurgery;  Laterality: N/A;    Assessment & Plan Clinical Impression: . Jason Skinner is a 29 year old limited English speaking right-handed male well-known to rehab services from admission 09/20/2019 to 10/05/2019 after 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  He was discharged to home ambulating 150 feet without assistance.  Per chart review lives with friends has a brother and family in the area.  Presented 11/23/2019 with noted decrease in mobility altered mental status as well as increase headache.  Admission chemistry potassium 3.2 glucose 109 alcohol negative urine drug screen negative lactic acid 1.2 urine culture less than 10,000  insignificant growth.  Cranial CT scan showed interval increase in mass-effect and despite the large craniectomy defect on the right.  The right cerebrum, principally the right frontal lobe is flattened and compressed with midline shift to the left of 1.5 cm.  No new intracranial hemorrhage noted.  Patient was seen by neurosurgery for syndrome of the Trephined.  Patient underwent retrieval craniectomy flap from the abdomen cranioplasty 11/28/2019 per Dr. Arnoldo Morale.  Maintained on Keppra for seizure prophylaxis.  He was cleared for Lovenox for DVT prophylaxis 12/02/2019.  Patient did have some urinary retention placed on low-dose Urecholine.  Tolerating a regular diet.  Therapy evaluations completed and patient was admitted for a comprehensive rehab program.   Patient transferred to CIR on 12/04/2019 .    Patient currently requires min with basic self-care skills secondary to muscle weakness, decreased cardiorespiratoy endurance, ataxia, decreased visual motor skills, decreased problem solving and delayed processing and decreased sitting balance, decreased standing balance, decreased postural control and decreased balance strategies.  Prior to hospitalization, patient could complete BADLs with independent .  Patient will benefit from skilled intervention to increase independence with basic self-care skills prior to discharge home with care partner.  Anticipate patient will require 24 hour supervision and follow up home health.  OT - End of Session Activity Tolerance: Tolerates < 10 min activity with changes in vital signs Endurance Deficit: Yes Endurance Deficit Description: pt would get very fatigued after just a few minutes of activity and would try to lay back down OT Assessment Rehab Potential (ACUTE ONLY): Good OT Patient demonstrates impairments in the following area(s): Balance;Cognition;Endurance;Motor;Safety;Vision OT Basic ADL's Functional Problem(s): Bathing;Dressing;Toileting OT Transfers  Functional Problem(s): Toilet;Tub/Shower OT Additional Impairment(s): None OT Plan OT Intensity: Minimum of 1-2 x/day, 45 to  90 minutes OT Frequency: 5 out of 7 days OT Duration/Estimated Length of Stay: 10-12 days OT Treatment/Interventions: Balance/vestibular training;Cognitive remediation/compensation;Discharge planning;DME/adaptive equipment instruction;Functional mobility training;Psychosocial support;Therapeutic Activities;UE/LE Strength taining/ROM;Visual/perceptual remediation/compensation;UE/LE Coordination activities;Therapeutic Exercise;Self Care/advanced ADL retraining;Patient/family education;Neuromuscular re-education OT Self Feeding Anticipated Outcome(s): independent OT Basic Self-Care Anticipated Outcome(s): supervision OT Toileting Anticipated Outcome(s): supervision OT Bathroom Transfers Anticipated Outcome(s): supervision OT Recommendation Patient destination: Home Follow Up Recommendations: Home health OT Equipment Recommended: To be determined   OT Evaluation Precautions/Restrictions  Precautions Precautions: Fall Restrictions Weight Bearing Restrictions: No  Pain Pain Assessment Pain Score: 0-No pain Home Living/Prior Functioning Home Living Family/patient expects to be discharged to:: Private residence Living Arrangements: Other relatives Available Help at Discharge: Friend(s), Available 24 hours/day Type of Home: Mobile home Home Access: Stairs to enter Technical brewer of Steps: 4-5 Entrance Stairs-Rails: Left, Right Home Layout: One level Bathroom Shower/Tub: Chiropodist: Standard  Lives With: Family Prior Function Level of Independence: Independent with basic ADLs, Independent with gait  Able to Take Stairs?: Yes Driving:  (drove prior to July admission) Vocation Requirements: worked full time prior to July admission Comments: wife and 2 daughters in [place] Vision Baseline Vision/History: No visual deficits Patient  Visual Report: No change from baseline Vision Assessment?: Yes Ocular Range of Motion: Within Functional Limits Alignment/Gaze Preference: Within Defined Limits Tracking/Visual Pursuits: Decreased smoothness of horizontal tracking;Decreased smoothness of vertical tracking Visual Fields: No apparent deficits Additional Comments: pt states his vision is "fine", was able to look at a small map on his phone, will continue to assess in functional tasks Perception  Perception: Within Functional Limits (no impairments observed during ADLS, will continue to assess) Praxis Praxis: Intact Cognition Overall Cognitive Status: Impaired/Different from baseline Arousal/Alertness: Lethargic Orientation Level: Person;Place;Situation Person: Oriented Place: Oriented Situation: Oriented Year: 2021 Month: September Day of Week: Correct Memory: Appears intact Immediate Memory Recall: Sock;Blue;Bed Memory Recall Sock: Without Cue Memory Recall Blue: Without Cue Memory Recall Bed: Without Cue Focused Attention: Appears intact Sustained Attention: Appears intact Selective Attention: Appears intact Problem Solving: Impaired Problem Solving Impairment: Verbal basic;Functional complex Sensation Sensation Light Touch: Appears Intact Hot/Cold: Appears Intact Proprioception: Appears Intact Stereognosis: Not tested Coordination Gross Motor Movements are Fluid and Coordinated: No Fine Motor Movements are Fluid and Coordinated: No Coordination and Movement Description: limited due to generalized weakness, slow movements but able to open containers without A Motor  Motor Motor: Ataxia Motor - Skilled Clinical Observations: generalized motor weakness, mild ataxia LEs  Trunk/Postural Assessment  Cervical Assessment Cervical Assessment: Exceptions to [facility] (pt not actively turning head side to side, but did with therapist guiding him) Thoracic Assessment Thoracic Assessment: Within Functional  Limits Lumbar Assessment Lumbar Assessment: Within Functional Limits Postural Control Postural Control: Deficits on evaluation Righting Reactions: delayed Protective Responses: delayed  Balance Static Sitting Balance Static Sitting - Level of Assistance: 5: Stand by assistance Dynamic Sitting Balance Dynamic Sitting - Level of Assistance: 4: Min assist Static Standing Balance Static Standing - Level of Assistance: 5: Stand by assistance Dynamic Standing Balance Dynamic Standing - Level of Assistance: 4: Min assist Extremity/Trunk Assessment RUE Assessment Active Range of Motion (AROM) Comments: WFL General Strength Comments: 3+/5 shoulder; 4-/5 elbow and hand LUE Assessment Active Range of Motion (AROM) Comments: WFL General Strength Comments: 3+/5 shoulder; 4-/5 elbow and hand  Care Tool Care Tool Self Care Eating   Eating Assist Level: (P) Set up assist    Oral Care    Oral Care Assist Level: Supervision/Verbal cueing  Bathing   Body parts bathed by patient: Right arm;Left arm;Chest;Abdomen;Front perineal area;Buttocks;Right upper leg;Left upper leg;Right lower leg;Left lower leg;Face     Assist Level: Contact Guard/Touching assist    Upper Body Dressing(including orthotics)   What is the patient wearing?: Pull over shirt   Assist Level: Supervision/Verbal cueing    Lower Body Dressing (excluding footwear)   What is the patient wearing?: Pants Assist for lower body dressing: Contact Guard/Touching assist    Putting on/Taking off footwear   What is the patient wearing?: Non-skid slipper socks Assist for footwear: Supervision/Verbal cueing       Care Tool Toileting Toileting activity    min A     Care Tool Bed Mobility Roll left and right activity    S    Sit to lying activity    S    Lying to sitting edge of bed activity   Lying to sitting edge of bed assist level: Supervision/Verbal cueing     Care Tool Transfers Sit to stand transfer   Sit to  stand assist level: Minimal Assistance - Patient > 75%    Chair/bed transfer   Chair/bed transfer assist level: Minimal Assistance - Patient > 75%     Toilet transfer   Assist Level: Minimal Assistance - Patient > 75%     Care Tool Cognition Expression of Ideas and Wants Expression of Ideas and Wants: Some difficulty - exhibits some difficulty with expressing needs and ideas (e.g, some words or finishing thoughts) or speech is not clear   Understanding Verbal and Non-Verbal Content Understanding Verbal and Non-Verbal Content: Usually understands - understands most conversations, but misses some part/intent of message. Requires cues at times to understand   Memory/Recall Ability *first 3 days only Memory/Recall Ability *first 3 days only: Current season;Location of own room;That he or she is in a hospital/hospital unit    Refer to Care Plan for [place] 1 OT Short Term Goal 1 (Week 1): Pt will demonstrate improved endurance to be able to complete all B/D within 15 minutes with no rest breaks. OT Short Term Goal 2 (Week 1): Pt will be able to ambulate to toilet with CGA with LRAD. OT Short Term Goal 3 (Week 1): Pt will complete toileting with S.  Recommendations for other services: None    Skilled Therapeutic Intervention ADL ADL Eating: Set up Grooming: Supervision/safety Where Assessed-Grooming: Standing at sink Upper Body Bathing: Supervision/safety Where Assessed-Upper Body Bathing: Edge of bed Lower Body Bathing: Contact guard Where Assessed-Lower Body Bathing: Edge of bed Upper Body Dressing: Supervision/safety Where Assessed-Upper Body Dressing: Edge of bed Lower Body Dressing: Contact guard Where Assessed-Lower Body Dressing: Edge of bed Toileting: Minimal assistance Where Assessed-Toileting: Glass blower/designer: Psychiatric nurse Method: Counselling psychologist: Grab bars Tub/Shower Transfer: Unable to  assess [employer] Transfer: Unable to assess  Pt seen for initial evaluation and ADL training.  Pt participated very well, although he expressed continuously how exhausted he was.  Pt wanted to keep taking rest breaks due to fatigue.  Overall he was able to accomplish toilet transfers, bathing and dressing, and standing at sink to brush teeth with minimal A.  Discussed his new decreased strength and how pt desires to return to his family in [place]. Pt resting in recliner at end of session with belt alarm on and all needs met.       Discharge Criteria: Patient will be discharged from OT if patient refuses  treatment 3 consecutive times without medical reason, if treatment goals not met, if there is a change in medical status, if patient makes no progress towards goals or if patient is discharged from hospital.  The above assessment, treatment plan, treatment alternatives and goals were discussed and mutually agreed upon: by patient  [facility] 12/05/2019, 1:07 PM

## 2019-12-05 NOTE — Evaluation (Signed)
Physical Therapy Assessment and Plan  Patient Details  Name: Jason Skinner MRN: [id number] Date of Birth: 08/18/1990  PT Diagnosis: Abnormality of gait, Coordination disorder, Difficulty walking, Hemiparesis non-dominant, Impaired cognition and Muscle weakness Rehab Potential: Good ELOS: 7-10 days   Today's Date: 12/05/2019 PT Individual Time: 1305-1415 PT Individual Time Calculation (min): 70 min    Hospital Problem: Principal Problem:   TBI (traumatic brain injury) (HCC)   Past Medical History: History reviewed. No pertinent past medical history. Past Surgical History:  Past Surgical History:  Procedure Laterality Date  . CRANIOTOMY Right 09/05/2019   Procedure: CRANIOTOMY HEMATOMA EVACUATION SUBDURAL WITH  PLACEMENT OF BONE FLAP IN ABDOMEN.;  Surgeon: Jenkins, Jeffrey, MD;  Location: [facility];  Service: Neurosurgery;  Laterality: Right;  right  . CRANIOTOMY N/A 11/28/2019   Procedure: REMOVAL OF CRANIAL BONE FLAP FROM ABDOMEN WITH CRANIOTOMY FOR IMPLANTATION OF BONE FLAP;  Surgeon: Jenkins, Jeffrey, MD;  Location: [facility] OR;  Service: Neurosurgery;  Laterality: N/A;    Assessment & Plan Clinical Impression: Patient is a 29 y.o. year old male limited English speaking right-handed male well-known to rehab services from admission 09/20/2019 to 10/05/2019 after 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  He was discharged to home ambulating 150 feet without assistance.  Per chart review lives with friends has a brother and family in the area.  Presented 11/23/2019 with noted decrease in mobility altered mental status as well as increase headache.  Admission chemistry potassium 3.2 glucose 109 alcohol negative urine drug screen negative lactic acid 1.2 urine culture less than 10,000  insignificant growth.  Cranial CT scan showed interval increase in mass-effect and despite the large craniectomy defect on the right.  The right cerebrum, principally the right frontal lobe is flattened and compressed with midline shift to the left of 1.5 cm.  No new intracranial hemorrhage noted.  Patient was seen by neurosurgery for syndrome of the Trephined.  Patient underwent retrieval craniectomy flap from the abdomen cranioplasty 11/28/2019 per Dr. Jenkins.  Maintained on Keppra for seizure prophylaxis.  He was cleared for Lovenox for DVT prophylaxis 12/02/2019.  Patient did have some urinary retention placed on low-dose Urecholine.  Tolerating a regular diet.  Therapy evaluations completed and patient was admitted for a comprehensive rehab program..  Patient transferred to CIR on 12/04/2019 .   Patient currently requires min with mobility secondary to muscle weakness, decreased cardiorespiratoy endurance, decreased coordination, slightly decreasd attention to L side of body , decreased attention, decreased awareness, decreased problem solving, decreased safety awareness, decreased memory and delayed processing, and decreased standing balance, decreased postural control, decreased balance strategies and L hemiparesis.  Prior to hospitalization, patient was supervision level after recent d/c from CIR with mobility and lived with Family in a Mobile home home.  Home access is 4-5Stairs to enter.  Patient will benefit from skilled PT intervention to maximize safe functional mobility, minimize fall risk and decrease caregiver burden for planned discharge home with 24 hour supervision.  Anticipate patient will benefit from follow up OP at discharge.  PT - End of Session Activity Tolerance: Tolerates 30+ min activity with multiple rests Endurance Deficit: Yes Endurance Deficit Description: generalized deconditioning PT Assessment Rehab Potential (ACUTE/IP ONLY): Good PT Patient demonstrates impairments in  the following area(s): Balance;Perception;Behavior;Safety;Sensory;Endurance;Motor PT Transfers Functional Problem(s): Bed Mobility;Bed to Chair;Car;Furniture;Floor PT Locomotion   Functional Problem(s): Ambulation;Stairs PT Plan PT Intensity: Minimum of 1-2 x/day ,45 to 90 minutes PT Frequency: 5 out of 7 days PT Duration Estimated Length of Stay: 7-10 days PT Treatment/Interventions: Ambulation/gait training;Community reintegration;DME/adaptive equipment instruction;Neuromuscular re-education;Psychosocial support;UE/LE Strength taining/ROM;Stair training;UE/LE Coordination activities;Therapeutic Activities;Skin care/wound management;Pain management;Functional electrical stimulation;Discharge planning;Balance/vestibular training;Cognitive remediation/compensation;Disease management/prevention;Functional mobility training;Patient/family education;Therapeutic Exercise;Splinting/orthotics;Visual/perceptual remediation/compensation PT Transfers Anticipated Outcome(s): supervision with LRAD PT Locomotion Anticipated Outcome(s): supervision with LRAD PT Recommendation Recommendations for Other Services: Speech consult;Neuropsych consult;Therapeutic Recreation consult Therapeutic Recreation Interventions: Stress management Follow Up Recommendations: Outpatient PT;24 hour supervision/assistance Patient destination: Home Equipment Recommended: To be determined   PT Evaluation Precautions/Restrictions Precautions Precautions: Fall Restrictions Weight Bearing Restrictions: No  General Chart Reviewed: Yes Response to Previous Treatment: Patient with no complaints from previous session. Family/Caregiver Present: No  Pain Denies pain  Home Living/Prior Functioning Home Living Available Help at Discharge: Friend(s);Available 24 hours/day Type of Home: Mobile home Home Access: Stairs to enter Entrance Stairs-Number of Steps: 4-5 Entrance Stairs-Rails: Left;Right;Can reach both Home Layout: One  level  Lives With: Family Prior Function Level of Independence: Independent with basic ADLs;Independent with gait  Able to Take Stairs?: Yes Driving:  (drove prior to July admission) Vocation: Full time employment Vocation Requirements: worked full time prior to July admission Comments: wife and 2 daughters in [place]  Vision/Perception  No baseline visual deficits.  Pt appears to have a slight L gaze preference during eval, will continue to assess.  Cognition Overall Cognitive Status: Impaired/Different from baseline Arousal/Alertness: Awake/alert Orientation Level: Oriented X4 Focused Attention: Appears intact Sustained Attention: slightly impaired Awareness: Impaired Awareness Impairment: Emergent impairment Problem Solving: Impaired Problem Solving Impairment: Functional complex Safety/Judgment: will continue to assess  Sensation Sensation Light Touch: Appears Intact Proprioception: Appears Intact Coordination Gross Motor Movements are Fluid and Coordinated: No Fine Motor Movements are Fluid and Coordinated: No Heel to shin: slightly impaired LLE compared to RLE  Motor  Motor Motor: Abnormal postural alignment and control Motor - Skilled Clinical Observations: generalized motor weakness, L hemiparesis   Trunk/Postural Assessment  Cervical Assessment Cervical Assessment: Exceptions to [facility] (pt not actively turning head side to side, but did with therapist guiding him) Thoracic Assessment Thoracic Assessment: Within Functional Limits Lumbar Assessment Lumbar Assessment: Within Functional Limits Postural Control Postural Control: Deficits on evaluation Righting Reactions: delayed Protective Responses: delayed   Balance Static Sitting Balance Static Sitting - Level of Assistance: 5: Stand by assistance Dynamic Standing Balance Dynamic Standing - Level of Assistance: 4: Min assist  Extremity Assessment  RUE Assessment Active Range of Motion (AROM) Comments:  WFL General Strength Comments: 3+/5 shoulder; 4-/5 elbow and hand LUE Assessment Active Range of Motion (AROM) Comments: WFL General Strength Comments: 3+/5 shoulder; 4-/5 elbow and hand  RLE Assessment Active Range of Motion (AROM) Comments: WFL General Strength Comments: 3+/5 LLE Assessment Active Range of Motion (AROM) Comments: WFL General Strength Comments: 3+/5 hip flexion in sitting, knee extension, 3/5 dorsiflexion    Care Tool Care Tool Bed Mobility Roll left and right activity   Roll left and right assist level: Supervision/Verbal cueing    Sit to lying activity   Sit to lying assist level: Supervision/Verbal cueing    Lying to sitting edge of bed activity   Lying to sitting edge of bed assist level: Supervision/Verbal cueing     Care Tool Transfers Sit to stand transfer   Sit to stand assist level: Minimal Assistance - Patient > 75%    Chair/bed transfer   Chair/bed transfer assist level:  Minimal Assistance - Patient > 75%     Toilet transfer   Assist Level: Minimal Assistance - Patient > 75%    Car transfer   Car transfer assist level: Minimal Assistance - Patient > 75%      Care Tool Locomotion Ambulation   Assist level: Minimal Assistance - Patient > 75% Assistive device: No Device Max distance: 300 ft  Walk 10 feet activity   Assist level: Minimal Assistance - Patient > 75% Assistive device: No Device   Walk 50 feet with 2 turns activity   Assist level: Minimal Assistance - Patient > 75% Assistive device: No Device  Walk 150 feet activity   Assist level: Minimal Assistance - Patient > 75% Assistive device: No Device  Walk 10 feet on uneven surfaces activity   Assist level: Minimal Assistance - Patient > 75% Assistive device:  (none)  Stairs   Assist level: Minimal Assistance - Patient > 75% Stairs assistive device: 2 hand rails Max number of stairs: 24 (6" + 3")  Walk up/down 1 step activity   Walk up/down 1 step (curb) assist level:  Minimal Assistance - Patient > 75% Walk up/down 1 step or curb assistive device: 2 hand rails    Walk up/down 4 steps activity Walk up/down 4 steps assist level: Minimal Assistance - Patient > 75% Walk up/down 4 steps assistive device: 2 hand rails  Walk up/down 12 steps activity   Walk up/down 12 steps assist level: Minimal Assistance - Patient > 75% Walk up/down 12 steps assistive device: 2 hand rails  Pick up small objects from floor   Pick up small object from the floor assist level: Minimal Assistance - Patient > 75%    Wheelchair Will patient use wheelchair at discharge?: No Type of Wheelchair: Manual   Wheelchair assist level: Minimal Assistance - Patient > 75% Max wheelchair distance: 150 ft  Wheel 50 feet with 2 turns activity   Assist Level: Minimal Assistance - Patient > 75%  Wheel 150 feet activity   Assist Level: Minimal Assistance - Patient > 75%    Refer to Care Plan for Long Term Goals  SHORT TERM GOAL WEEK 1 PT Short Term Goal 1 (Week 1): STG = LTG due to short ELOS.  Recommendations for other services: Neuropsych and Therapeutic Recreation  Stress management and Outing/community reintegration  Skilled Therapeutic Intervention Patient received asleep in bed but easily awakened & agreeable to tx. IPad interpreter used during session. Pt completes functional mobility as noted above & below. Pt demonstrates impaired attention & impaired awareness. Pt presents with impaired strength LUE/LLE, impaired neuromuscular control of L side, and slight L lateral lean in standing/gait. Pt would benefit from skilled PT treatment to address deficits noted above/below to increase independence prior to d/c. Pt utilized nu-step on level 2 x 10 minutes with all 4 extremities with task focusing on global strengthening & endurance training, LUE/LLE NMR. At end of session pt in bed with alarm set, call bell & all needs in reach.   Mobility Bed Mobility Bed Mobility: Sit to Supine;Supine  to Sit Supine to Sit: Supervision/Verbal cueing Sit to Supine: Supervision/Verbal cueing Transfers Transfers: Sit to Stand;Stand to Sit Sit to Stand: Minimal Assistance - Patient > 75% Stand to Sit: Minimal Assistance - Patient > 75% Transfer (Assistive device): None   Locomotion  Gait Ambulation: Yes Gait Assistance: Minimal Assistance - Patient > 75% Gait Distance (Feet):  (200 ft + 300 ft + 100 ft) Assistive device: None Gait Assistance Details:   manual facilitation to correct L lateral lean Gait Gait: Yes Gait Pattern: Decreased step length - right;Decreased step length - left;Decreased stride length;Decreased dorsiflexion - left;Decreased weight shift to right;Decreased hip/knee flexion - left (decreased heel strike LLE) Gait velocity: decreased Stairs / Additional Locomotion Stairs: Yes Stairs Assistance: Minimal Assistance - Patient > 75% Stair Management Technique: Two rails;Step to pattern Number of Stairs: 24 Height of Stairs:  (6" + 3") Ramp: Minimal Assistance - Patient >75% (ambulatory without AD) Wheelchair Mobility Wheelchair Mobility: Yes Wheelchair Assistance: Supervision/Verbal cueing Wheelchair Propulsion: Both upper extremities Wheelchair Parts Management: Needs assistance Distance: 150 ft   Discharge Criteria: Patient will be discharged from PT if patient refuses treatment 3 consecutive times without medical reason, if treatment goals not met, if there is a change in medical status, if patient makes no progress towards goals or if patient is discharged from hospital.  The above assessment, treatment plan, treatment alternatives and goals were discussed and mutually agreed upon: by patient   M  12/05/2019, 4:46 PM   

## 2019-12-05 NOTE — Evaluation (Signed)
Speech Language Pathology Assessment and Plan  Patient Details  Name: Jason Skinner MRN: [id number] Date of Birth: Jul 19, 1990  SLP Diagnosis: Cognitive Impairments  Rehab Potential: Good ELOS: 10-12 days    Today's Date: 12/05/2019 SLP Individual Time: 2536-6440 SLP Individual Time Calculation (min): 55 min   Hospital Problem: Principal Problem:   TBI (traumatic brain injury) ([facility] [place])  Past Medical History: History reviewed. No pertinent past medical history. Past Surgical History:  Past Surgical History:  Procedure Laterality Date  . CRANIOTOMY Right 09/05/2019   Procedure: CRANIOTOMY HEMATOMA EVACUATION SUBDURAL WITH  PLACEMENT OF BONE FLAP IN ABDOMEN.;  Surgeon: Newman Pies, MD;  Location: [facility];  Service: Neurosurgery;  Laterality: Right;  right  . CRANIOTOMY N/A 11/28/2019   Procedure: REMOVAL OF CRANIAL BONE FLAP FROM ABDOMEN WITH CRANIOTOMY FOR IMPLANTATION OF BONE FLAP;  Surgeon: Newman Pies, MD;  Location: [facility];  Service: Neurosurgery;  Laterality: N/A;    Assessment / Plan / Recommendation Clinical Impression Patient is a 29 year old limited English speaking right-handed male well-known to rehab services from admission 09/20/2019 to 10/05/2019 after 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 Presented 11/23/2019 with noted decrease in mobility altered mental status as well as increase headache. Admission chemistry potassium 3.2 glucose 109 alcohol negative urine drug screen negative lactic acid 1.2 urine culture less than 10,000 insignificant growth. Cranial CT scan showed interval increase in mass-effect despite the large craniectomy defect on the right. The right cerebrum, principally the right frontal lobe is flattened and compressed with midline shift  to the left of 1.5 cm. No new intracranial hemorrhage noted. Patient was seen by neurosurgery for Syndrome of the Trephined. Patient underwent retrieval craniectomy flap from the abdomen and cranioplasty 11/28/2019 per Dr. Arnoldo Morale. Maintained on Keppra for seizure prophylaxis. He was cleared for Lovenox for DVT prophylaxis 12/02/2019. Patient did have some urinary retention placed on low-dose Urecholine. Tolerating a regular diet. Therapy evaluations completed and patient was recommended for a comprehensive rehab program. Patient admitted 12/04/19.  Patient demonstrates mild cognitive impairments impacting problem solving, visual scanning to the right field of environment and emergent awareness which impacts his safety with functional and familiar tasks. Patient was administered the SLUMS and scored 22/30 points indicative of a mild neurocognitive impairment. Patient's verbal expression and auditory comprehension appeared [facility] but patient did demonstrate a flat affect with delayed processing.  Patient would benefit from skilled SLP intervention to maximize his cognitive function and overall functional independence prior to discharge.    Skilled Therapeutic Interventions          Administered a cognitive-linguistic evaluation, please see above for details. SLP facilitated session by writing his schedule in Spanish to maximize recall and utilization. Patient able to read and utilize the schedule with extra time and overall supervision verbal cues. Patient left upright in bed with alarm on and all needs within reach. Continue with current plan of care.    SLP Assessment  Patient will need skilled [facility] during CIR admission    Recommendations  Oral Care Recommendations: Oral care BID Recommendations for Other Services: Neuropsych consult Patient destination: Home Follow up Recommendations: Home Health SLP;Outpatient SLP;24 hour supervision/assistance Equipment Recommended:  None recommended by SLP    SLP Frequency 3 to 5 out of 7 days   SLP Duration  SLP Intensity  SLP Treatment/Interventions  10-12 days  Minumum of 1-2 x/day, 30 to 90 minutes  Cognitive remediation/compensation;Internal/external aids;Cueing hierarchy;Environmental controls;Therapeutic Activities;Functional tasks;Patient/family education    Pain Pain Assessment Pain Score: 0-No pain  Prior Functioning Type of Home: Mobile home  Lives With: Family Available Help at Discharge: Friend(s);Available 24 hours/day  SLP Evaluation Cognition Overall Cognitive Status: Impaired/Different from baseline Arousal/Alertness: Awake/alert Orientation Level: Oriented X4 Focused Attention: Appears intact Sustained Attention: Appears intact Selective Attention: Appears intact Memory: Appears intact Immediate Memory Recall: Sock;Blue;Bed Memory Recall Sock: Without Cue Memory Recall Blue: Without Cue Memory Recall Bed: Without Cue Awareness: Impaired Awareness Impairment: Emergent impairment Problem Solving: Impaired Problem Solving Impairment: Functional complex Safety/Judgment: Appears intact  Comprehension Auditory Comprehension Overall Auditory Comprehension: Appears within functional limits for tasks assessed Yes/No Questions: Within Functional Limits Commands: Within Functional Limits Conversation: Simple Visual Recognition/Discrimination Discrimination: Within Function Limits Reading Comprehension Reading Status: Within funtional limits Expression Expression Primary Mode of Expression: Verbal Verbal Expression Overall Verbal Expression: Impaired Pragmatics: Impairment Impairments: Eye contact;Abnormal affect;Monotone Interfering Components: Attention Effective Techniques: Open ended questions Written Expression Dominant Hand: Right Written Expression: Within Functional Limits Oral Motor Oral Motor/Sensory Function Overall Oral Motor/Sensory Function: Within functional  limits Motor Speech Overall Motor Speech: Appears within functional limits for tasks assessed  Care Tool Care Tool Cognition Expression of Ideas and Wants Expression of Ideas and Wants: Some difficulty - exhibits some difficulty with expressing needs and ideas (e.g, some words or finishing thoughts) or speech is not clear   Understanding Verbal and Non-Verbal Content Understanding Verbal and Non-Verbal Content: Usually understands - understands most conversations, but misses some part/intent of message. Requires cues at times to understand   Memory/Recall Ability *first 3 days only Memory/Recall Ability *first 3 days only: Current season;Location of own room;That he or she is in a hospital/hospital unit     Short Term Goals: Week 1: SLP Short Term Goal 1 (Week 1): Patient will demonstrate functional problem solving for basic and mildly complex tasks with Min A verbal cues. SLP Short Term Goal 2 (Week 1): Patient will attend/scan to right field of enviornment during functional tasks with Min A verbal and visual cues. SLP Short Term Goal 3 (Week 1): Patient will self-monitor and correct errors during functional tasks with Min A verbal cues.  Refer to Care Plan for Long Term Goals  Recommendations for other services: Neuropsych  Discharge Criteria: Patient will be discharged from SLP if patient refuses treatment 3 consecutive times without medical reason, if treatment goals not met, if there is a change in medical status, if patient makes no progress towards goals or if patient is discharged from hospital.  The above assessment, treatment plan, treatment alternatives and goals were discussed and mutually agreed upon: by patient  Castella Lerner 12/05/2019, 2:06 PM

## 2019-12-05 NOTE — Progress Notes (Addendum)
Patient ID: Jason Skinner, male   DOB: [birth year], 29 y.o.   MRN: [id number]  SW spoke with Jason Skinner/RN CM with [employer]  [phone]) to discuss patient case. Reports she currently serves as third party liaison monitoring pt care until [employer] comp determines if patient's case is deemed compensable. If accepted, WC will have a new case manager assigned. Reports she intends to visit pt today or tomorrow. SW notified front desk for her visitations. Pt remains uninsured at this time.  *SW met with Jason Skinner and pt in room using [employer] Spanish Interpreter Jason Skinner ID [phone] so she could perform wellness check. Pt continues to live with brother, and friend. Pt states his brother will be here tomorrow. SW provided [place] with H&P.  Marland KitchenLoralee Skinner, MSW, [place] Office: [phone] Cell: [phone] Fax: [phone]

## 2019-12-05 NOTE — Progress Notes (Signed)
[facility] PHYSICAL MEDICINE & REHABILITATION PROGRESS NOTE   Subjective/Complaints: Indicates he didn't sleep that well. Mild headache. Excited to participate in therapy today!  ROS: limited due to language/communication    Objective:   No results found. Recent Labs    12/04/19 1920 12/05/19 0724  WBC 12.5* 11.3*  HGB 13.5 13.8  HCT 38.7* 39.4  PLT 582* 616*   Recent Labs    12/04/19 1920 12/05/19 0724  NA  --  131*  K  --  3.4*  CL  --  100  CO2  --  20*  GLUCOSE  --  114*  BUN  --  5*  CREATININE 0.77 0.66  CALCIUM  --  10.0    Intake/Output Summary (Last 24 hours) at 12/05/2019 0839 Last data filed at 12/05/2019 0604 Gross per 24 hour  Intake 120 ml  Output 775 ml  Net -655 ml        Physical Exam: Vital Signs Blood pressure 110/77, pulse (!) 102, temperature 98.1 F (36.7 C), temperature source Oral, resp. rate 18, SpO2 99 %. Gen: no distress, normal appearing HEENT: cranioplasty site clean and intact with minor swelling Cardio: Reg rate Chest: normal effort, normal rate of breathing Abd: soft, non-distended, sl tender near abd wound.  Ext: no edema Skin: incisions CDI.  Neuro: alert, follows basic commands. Moves all 4's L>R.. Language barrier to a degree.  Musculoskeletal: Psych: flat, cooperative   Assessment/Plan: 1. 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  Physiatrist is providing close team supervision and 24 hour management of active medical problems listed below.  Physiatrist and rehab team continue to assess barriers to discharge/monitor patient progress toward functional and medical goals  Care Tool:  Bathing              Bathing assist       Upper Body Dressing/Undressing Upper body dressing   What is the patient wearing?: Hospital gown only    Upper body assist Assist Level: Minimal Assistance -  Patient > 75%    Lower Body Dressing/Undressing Lower body dressing      What is the patient wearing?: Hospital gown only     Lower body assist Assist for lower body dressing: Minimal Assistance - Patient > 75%     Toileting Toileting    Toileting assist Assist for toileting: Minimal Assistance - Patient > 75% Assistive Device Comment: urinal   Transfers Chair/bed transfer  Transfers assist           Locomotion Ambulation   Ambulation assist              Walk 10 feet activity   Assist           Walk 50 feet activity   Assist           Walk 150 feet activity   Assist           Walk 10 feet on uneven surface  activity   Assist           Wheelchair     Assist               Wheelchair 50 feet with 2 turns activity    Assist            Wheelchair 150 feet activity     Assist          Blood pressure 110/77, pulse Marland Kitchen)  102, temperature 98.1 F (36.7 C), temperature source Oral, resp. rate 18, SpO2 99 %.  Medical Problem List and Plan: 1.  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 Arnoldo Morale.             -patient may not shower             -ELOS/Goals: minA in 10-14 days  -begin therapies today 2.  Antithrombotics: -DVT/anticoagulation: Lovenox initiated 12/02/2019.             -antiplatelet therapy: N/A 3. Pain Management: Topamax 75 mg twice daily, hydrocodone/Robaxin as needed. Well controlled at this time.  4. Mood: Advised emotional support             -antipsychotic agents: N/A 5. Neuropsych: This patient is capable of making decisions on his own behalf. 6. Skin/Wound Care: Wound care of abdominal and cranial sites  -both areas clean and dry 7. Fluids/Electrolytes/Nutrition: encourage appropriate PO  -I personally reviewed the patient's labs today.    -mild hyponatremia--recheck Friday 8.  Seizure  prophylaxis.  Keppra 500 mg twice daily 9.  Urinary retention.  Urecholine 10 mg 3 times daily.    -monitor voids, PVR's 10.  Constipation.  MiraLAX daily. Colace 100mg  BID.    -had bm yesterday 11. Leukocytosis  -down to 11.3k today, no s/s infection  -f/u next week.  LOS: 1 days A FACE TO FACE EVALUATION WAS PERFORMED  Meredith Staggers 12/05/2019, 8:39 AM

## 2019-12-05 NOTE — Discharge Instructions (Signed)
Inpatient Rehab Discharge Instructions  Jason Skinner Discharge date and time: No discharge date for patient encounter.   Activities/Precautions/ Functional Status: Activity: activity as tolerated Diet: regular diet Wound Care: keep wound clean and dry Functional status:  ___ No restrictions     ___ Walk up steps independently ___ 24/7 supervision/assistance   ___ Walk up steps with assistance ___ Intermittent supervision/assistance  ___ Bathe/dress independently ___ Walk with walker     _x__ Bathe/dress with assistance ___ Walk Independently    ___ Shower independently ___ Walk with assistance    ___ Shower with assistance ___ No alcohol     ___ Return to work/school ________  Special Instructions:  No driving smoking or alcohol  My questions have been answered and I understand these instructions. I will adhere to these goals and the provided educational materials after my discharge from the hospital.  Patient/Caregiver Signature _______________________________ Date __________  Clinician Signature _______________________________________ Date __________  Please bring this form and your medication list with you to all your follow-up doctor's appointments.

## 2019-12-06 ENCOUNTER — Inpatient Hospital Stay (HOSPITAL_COMMUNITY): Payer: Self-pay | Admitting: Occupational Therapy

## 2019-12-06 ENCOUNTER — Inpatient Hospital Stay (HOSPITAL_COMMUNITY): Payer: Self-pay | Admitting: Speech Pathology

## 2019-12-06 ENCOUNTER — Inpatient Hospital Stay (HOSPITAL_COMMUNITY): Payer: Self-pay

## 2019-12-06 NOTE — Care Management (Signed)
[facility] Individual Statement of Services  Patient Name:  Jason Skinner  Date:  12/06/2019  Welcome to the [facility].  Our goal is to provide you with an individualized program based on your diagnosis and situation, designed to meet your specific needs.  With this comprehensive rehabilitation program, you will be expected to participate in at least 3 hours of rehabilitation therapies Monday-Friday, with modified therapy programming on the weekends.  Your rehabilitation program will include the following services:  Physical Therapy (PT), Occupational Therapy (OT), Speech Therapy (ST), 24 hour per day rehabilitation nursing, Therapeutic Recreaction (TR), Psychology, Neuropsychology, Care Coordinator, Rehabilitation Medicine, Nutrition Services, Pharmacy Services and Other  Weekly team conferences will be held on Tuesdays to discuss your progress.  Your Inpatient Rehabilitation Care Coordinator will talk with you frequently to get your input and to update you on team discussions.  Team conferences with you and your family in attendance may also be held.  Expected length of stay: 7-12 days   Overall anticipated outcome: Supervision  Depending on your progress and recovery, your program may change. Your Inpatient Rehabilitation Care Coordinator will coordinate services and will keep you informed of any changes. Your Inpatient Rehabilitation Care Coordinator's name and contact numbers are listed  below.  The following services may also be recommended but are not provided by the [facility] will be made to provide these services after discharge if needed.  Arrangements include referral to agencies that provide these services.  Your insurance has been verified to be:  Uninsured   Your  primary doctor is:  Management consultant  Pertinent information will be shared with your doctor and your insurance company.  Inpatient Rehabilitation Care Coordinator:  Cathleen Corti [phone] or ([phone]  Information discussed with and copy given to patient by: Rana Snare, 12/06/2019, 1:35 PM

## 2019-12-06 NOTE — Plan of Care (Signed)
  Problem: Consults Goal: RH GENERAL PATIENT EDUCATION Description: See Patient Education module for education specifics. Outcome: Progressing   Problem: RH SKIN INTEGRITY Goal: RH STG SKIN FREE OF INFECTION/BREAKDOWN Description: With min assist  Outcome: Progressing   Problem: RH SAFETY Goal: RH STG ADHERE TO SAFETY PRECAUTIONS W/ASSISTANCE/DEVICE Description: STG Adhere to Safety Precautions With cues/reminders Assistance/Device. Outcome: Progressing   Problem: RH KNOWLEDGE DEFICIT GENERAL Goal: RH STG INCREASE KNOWLEDGE OF SELF CARE AFTER HOSPITALIZATION Description: With min assist Outcome: Progressing

## 2019-12-06 NOTE — Progress Notes (Signed)
[facility] PHYSICAL MEDICINE & REHABILITATION PROGRESS NOTE   Subjective/Complaints: Had a reasonable nights. Some discomfort at crani,abd sites. Asked if staples looked ok  ROS: Limited due to cognitive/behavioral    Objective:   No results found. Recent Labs    12/04/19 1920 12/05/19 0724  WBC 12.5* 11.3*  HGB 13.5 13.8  HCT 38.7* 39.4  PLT 582* 616*   Recent Labs    12/04/19 1920 12/05/19 0724  NA  --  131*  K  --  3.4*  CL  --  100  CO2  --  20*  GLUCOSE  --  114*  BUN  --  5*  CREATININE 0.77 0.66  CALCIUM  --  10.0    Intake/Output Summary (Last 24 hours) at 12/06/2019 1028 Last data filed at 12/06/2019 0900 Gross per 24 hour  Intake 240 ml  Output 850 ml  Net -610 ml        Physical Exam: Vital Signs Blood pressure 101/68, pulse 92, temperature 97.7 F (36.5 C), resp. rate 17, height 5\' 7"  (1.702 m), SpO2 99 %. Constitutional: No distress . Vital signs reviewed. HEENT: EOMI, oral membranes moist Neck: supple Cardiovascular: RRR without murmur. No JVD    Respiratory/Chest: CTA Bilaterally without wheezes or rales. Normal effort    GI/Abdomen: BS +, non-tender, non-distended Ext: no clubbing, cyanosis, or limb edema Psych: a little distracted but cooperative Skin:crani incision CDI, abdominal incision CDI both with staples. Some flap swelling  Neuro: alert, follows basic commands. Moves all 4's L>R.. Language barrier to a degree, but we communicated in spanish with less delay Musculoskeletal: normal ROM     Assessment/Plan: 1. 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  Physiatrist is providing close team supervision and 24 hour management of active medical problems listed below.  Physiatrist and rehab team continue to assess barriers to discharge/monitor patient progress toward functional and medical goals  Care  Tool:  Bathing    Body parts bathed by patient: Right arm, Left arm, Chest, Abdomen, Front perineal area, Buttocks, Right upper leg, Left upper leg, Right lower leg, Left lower leg, Face         Bathing assist Assist Level: Contact Guard/Touching assist     Upper Body Dressing/Undressing Upper body dressing   What is the patient wearing?: Pull over shirt    Upper body assist Assist Level: Supervision/Verbal cueing    Lower Body Dressing/Undressing Lower body dressing      What is the patient wearing?: Pants     Lower body assist Assist for lower body dressing: Independent with assitive device     Toileting Toileting    Toileting assist Assist for toileting: Minimal Assistance - Patient > 75% Assistive Device Comment: urinal   Transfers Chair/bed transfer  Transfers assist     Chair/bed transfer assist level: Minimal Assistance - Patient > 75%     Locomotion Ambulation   Ambulation assist      Assist level: Minimal Assistance - Patient > 75% Assistive device: No Device Max distance: 300 ft   Walk 10 feet activity   Assist     Assist level: Minimal Assistance - Patient > 75% Assistive device: No Device   Walk 50 feet activity   Assist    Assist level: Minimal Assistance - Patient > 75% Assistive device: No Device    Walk 150 feet activity   Assist    Assist level:  Minimal Assistance - Patient > 75% Assistive device: No Device    Walk 10 feet on uneven surface  activity   Assist     Assist level: Minimal Assistance - Patient > 75% Assistive device:  (none)   Wheelchair     Assist Will patient use wheelchair at discharge?: No Type of Wheelchair: Manual    Wheelchair assist level: Minimal Assistance - Patient > 75% Max wheelchair distance: 150 ft    Wheelchair 50 feet with 2 turns activity    Assist        Assist Level: Minimal Assistance - Patient > 75%   Wheelchair 150 feet activity     Assist       Assist Level: Minimal Assistance - Patient > 75%   Blood pressure 101/68, pulse 92, temperature 97.7 F (36.5 C), resp. rate 17, height 5\' 7"  (1.702 m), SpO2 99 %.  Medical Problem List and Plan: 1.  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 Arnoldo Morale.             -patient may not shower             -ELOS/Goals: minA in 10-14 days  --Continue CIR therapies including PT, OT, and SLP  2.  Antithrombotics: -DVT/anticoagulation: Lovenox initiated 12/02/2019.             -antiplatelet therapy: N/A 3. Pain Management: Topamax 75 mg twice daily, hydrocodone/Robaxin as needed.   -appears controlled at present 4. Mood: Advised emotional support             -antipsychotic agents: N/A 5. Neuropsych: This patient is capable of making decisions on his own behalf. 6. Skin/Wound Care: Wound care of abdominal and cranial sites  -both areas clean and dry with staples 7. Fluids/Electrolytes/Nutrition: encourage appropriate PO  -I personally reviewed the patient's labs today.    -mild hyponatremia--recheck Friday 8.  Seizure prophylaxis.  Keppra 500 mg twice daily 9.  Urinary retention.  Urecholine 10 mg 3 times daily.    -monitor voids, PVR's 10.  Constipation.  MiraLAX daily. Colace 100mg  BID.    -had bm 9/28 11. Leukocytosis  -down to 11.3k today, no s/s infection  -f/u next week.  LOS: 2 days A FACE TO FACE EVALUATION WAS PERFORMED  Meredith Staggers 12/06/2019, 10:28 AM

## 2019-12-06 NOTE — Progress Notes (Addendum)
Physical Therapy Session Note 10 min missed time due to fatigue  Patient Details  Name: Jason Skinner MRN: [id number] Date of Birth: December 08, 1990  Today's Date: 12/06/2019 PT Individual Time: 1300-1405 PT Individual Time Calculation (min): 65 min   Short Term Goals: Week 1:  PT Short Term Goal 1 (Week 1): STG = LTG due to short ELOS.  Skilled Therapeutic Interventions/Progress Updates:    PAIN  Denies pain Pt initially awake and alert.  Translator present for full session.  Brother observed session. Supine to sit w/supervision STS and Gait 150wmin assist for balance, inconsistent step length and cadence. Turn/sit to mat w/min assist due to post tendency w/turning  Therapist educated pt/brother re: use of maxiski for balance training.  Maxiski harness applied/fastened by therapist.   Noted brother using videocall feature in gym, educated re: pt privacy/protection/gym policy regarding not allowing video.  Brother voiced understanding, but later resumed video call/required reed.  Maxi ski activities for dynamic balance challenge: Gait trials First and third trials - Gait 105ft x 10 including 180* turns, cues to relax UEs and allow armswing, mild imbalance esp w/turning.  Second trial - added cadence challenge, improved overall gait pattern at increased cadence fourth trial - Gait x 15/Backingx 15, 4 rounds  - very poor balance w/this activity, loses balance posteriorly 100%time without intervention, worked on stopping and regaining balance every few steps   Balance activities Standing ball kick, ball toss, bounce pass, dribbling all in standing for balance challenge, ball kick most challenging from balance standpoint, dribbling challenging UE coordination activity.  Pt rests in chair for support between each activity.  Seated dribbling bilat hands then progress to single hands for coorniation training, increased challenge w/LUE but improves w/repitition.  Pt stands  spontaneously/imppulsively and resumes gait in maxiski despite cues to sit and rest. Gait w/resistance band at pelvis/resistance from behind to promote ant wtshift/core stability, 62ft x 4.  Gait without AD w/cga to min assist 177ft x 2 as above.   At this point, pt stated he was very tired and needed to lie down.  Placing face in hands and closing eyes while seated in chair.  Gait back to room x 30ft w/min assist to cga.   Pt and brother had no questions at this point.   SPT to bed w/cga, sit to supine w/supervision.   Pt left supine w/rails up x 4, alarm set, bed in lowest position, and needs in reach.  Therapy Documentation Precautions:  Precautions Precautions: Fall Restrictions Weight Bearing Restrictions: No   Therapy/Group: Individual Therapy  Callie Fielding, [place] 12/06/2019, 2:18 PM

## 2019-12-06 NOTE — Progress Notes (Signed)
   Patient Details  Name: Jason Skinner MRN: [id number] Date of Birth: 02-28-1991  Today's Date: 12/06/2019  Hospital Problems: Principal Problem:   TBI (traumatic brain injury) [facility])  Past Medical History: History reviewed. No pertinent past medical history. Past Surgical History:  Past Surgical History:  Procedure Laterality Date  . CRANIOTOMY Right 09/05/2019   Procedure: CRANIOTOMY HEMATOMA EVACUATION SUBDURAL WITH  PLACEMENT OF BONE FLAP IN ABDOMEN.;  Surgeon: Newman Pies, MD;  Location: [facility];  Service: Neurosurgery;  Laterality: Right;  right  . CRANIOTOMY N/A 11/28/2019   Procedure: REMOVAL OF CRANIAL BONE FLAP FROM ABDOMEN WITH CRANIOTOMY FOR IMPLANTATION OF BONE FLAP;  Surgeon: Newman Pies, MD;  Location: [facility];  Service: Neurosurgery;  Laterality: N/A;   Social History:  reports that he has never smoked. He has never used smokeless tobacco. He reports previous alcohol use. He reports previous drug use.  Family / Support Systems Marital Status: Married How Long?: 8 years Patient Roles: Spouse, Parent Spouse/Significant Other: Wife lives in [place] Children: 2 daughters; live in [place] with their mother Other Supports: [name] and brother [name] Anticipated Caregiver: Brother [name] and a friend Ability/Limitations of Caregiver: Brother continues to work, but provides care outside of work hours Careers adviser: 24/7 Family Dynamics: Pt lives with brother and friend  Social History Preferred language: Spanish Religion:  Cultural Background: Pt was working with [name] with roofing business Education: middle school Read: Yes Write: Yes Employment Status: Disabled Date Retired/Disabled/Unemployed: pt currently unable due to injury Return to Work Plans: N/A Public relations account executive Issues: Denies Guardian/Conservator: N/A   Abuse/Neglect Abuse/Neglect Assessment Can Be Completed: Yes Physical Abuse: Denies Verbal Abuse: Denies Sexual  Abuse: Denies Exploitation of patient/patient's resources: Denies Self-Neglect: Denies  Emotional Status Pt's affect, behavior and adjustment status: Pt in good spirits at time of visit; in therapy Recent Psychosocial Issues: Denies Psychiatric History: Denies Substance Abuse History: Denies  Patient / Family Perceptions, Expectations & Goals Pt/Family understanding of illness & functional limitations: Pt brother has a general understanding Premorbid pt/family roles/activities: Assistance with ADLs/IADLs Anticipated changes in roles/activities/participation: Pt will continue to require 24/7 supervision  [place] Agencies: None Premorbid Home Care/DME Agencies: None Transportation available at discharge: brother  Discharge Planning Living Arrangements: Other relatives, Non-relatives/Friends Support Systems: Other relatives, Friends/neighbors Type of Residence: Private residence Insurance Resources: Teacher, adult education Resources: Family Support Financial Screen Referred: Previously completed Living Expenses: Rent Money Management: Family Does the patient have any problems obtaining your medications?: No Care Coordinator Barriers to Discharge: Decreased caregiver support, Lack of/limited family support Care Coordinator Barriers to Discharge Comments: Pt is uninsured. Pt case is pendign worker's Ship broker Anticipated Follow Up Needs: HH/OP Expected length of stay: 7-12 days  Clinical Impression SW familiar with pt due to previous hospitalization.  SW met with pt and pt brother to get updates and see if there were any changes. No changes to d/c plan which is d/c to home with assistance from brother and friend. No HCPOA. No DME. SW informed pt brother [name] worker's comp case is currently pending at this time, and will follow-up if the case is approved.  Arda Keadle A Ivey Cina 12/06/2019, 2:22 PM

## 2019-12-06 NOTE — Progress Notes (Addendum)
Pt and pt brother concerned about pt not sleeping. Would like to get something for sleep. Earlier in the week ( 9/28) pt was advised that with TBI sleep aids are not recommended per on call MD. No other cocnerns to report.

## 2019-12-06 NOTE — Progress Notes (Signed)
Occupational Therapy Session Note  Patient Details  Name: Jason Skinner MRN: [id number] Date of Birth: 12-Oct-1990  Today's Date: 12/06/2019 OT Individual Time: 1100-1200 OT Individual Time Calculation (min): 60 min    Short Term Goals: Week 1:  OT Short Term Goal 1 (Week 1): Pt will demonstrate improved endurance to be able to complete all B/D within 15 minutes with no rest breaks. OT Short Term Goal 2 (Week 1): Pt will be able to ambulate to toilet with CGA with LRAD. OT Short Term Goal 3 (Week 1): Pt will complete toileting with S.  Skilled Therapeutic Interventions/Progress Updates:      Pt seen for BADL retraining of toileting, bathing, and dressing with a focus on balance, strength, sequencing.  Pt's brother, [name], present and provided him with education during session and interpretor present.  Pt received in bed and pt sat to EOB with S, stood and ambulated to bathroom with CGA.  Completed b/d from toilet and pt also toileted.  He was able to stand up from toilet with bar with S, then CGA for balance once standing.  Pt then ambulated out to sink and stood with S for several minutes to complete oral care.  No A needed for set up.   Pt then sat in arm chair with cues for upright posture.  Focused on UE strength with 3 sets of exercises while sitting in arm chair:  2# dowel bar chest press 10x  2#dowel bar elbow curls 10x focusing L elbow ext Chair push ups 10x Holding soccer ball tapping ball sh to sh 10x each shoulder Holding ball will long extension stretch over head 10x  Pt stated he was very fatigued and wanted to lay down. When pt first stood up he needed min A to support his balance as he waivered back and forth.  Pt did state he felt a little dizzy.  Pt moved to supine and set up in bed in chair position.  Worked on hand and finger strength with medium theraputty.   His brother was face timing with pt's family in [place] and pt laughing and smiling and talking with his  wife and kids.  Pt in bed with all needs met and bed alarm set.    Therapy Documentation Precautions:  Precautions Precautions: Fall Restrictions Weight Bearing Restrictions: No   Pain: Pain Assessment Pain Score: 0-No pain ADL: ADL Eating: Set up Grooming: Supervision/safety Where Assessed-Grooming: Standing at sink Upper Body Bathing: Supervision/safety Where Assessed-Upper Body Bathing: Edge of bed Lower Body Bathing: Contact guard Where Assessed-Lower Body Bathing: Edge of bed Upper Body Dressing: Supervision/safety Where Assessed-Upper Body Dressing: Edge of bed Lower Body Dressing: Contact guard Where Assessed-Lower Body Dressing: Edge of bed Toileting: Contact guard Where Assessed-Toileting: Glass blower/designer: Therapist, music Method: Counselling psychologist: Grab bars Tub/Shower Transfer: Unable to assess [employer] Transfer: Unable to assess   Therapy/Group: Individual Therapy  [place] 12/06/2019, 11:44 AM

## 2019-12-06 NOTE — Progress Notes (Signed)
Speech Language Pathology Daily Session Note  Patient Details  Name: Jason Skinner MRN: [id number] Date of Birth: 1991/02/25  Today's Date: 12/06/2019 SLP Individual Time: 0802-2336 SLP Individual Time Calculation (min): 55 min  Short Term Goals: Week 1: SLP Short Term Goal 1 (Week 1): Patient will demonstrate functional problem solving for basic and mildly complex tasks with Min A verbal cues. SLP Short Term Goal 2 (Week 1): Patient will attend/scan to right field of enviornment during functional tasks with Min A verbal and visual cues. SLP Short Term Goal 3 (Week 1): Patient will self-monitor and correct errors during functional tasks with Min A verbal cues.   Skilled Therapeutic Interventions: Skilled treatment session focused on cognitive goals. SLP facilitated session by providing extra time and overall Mod A verbal and visual cues for functional problem solving during a basic money management task. Min A verbal cues were required for problem solving during a mildly complex medication management task. Patient's brother present throughout session but patient continued to demonstrate a flat affect with decreased verbal engagement. Patient left upright in bed with alarm on and all needs within reach. Continue with current plan of care.      Pain Pain Assessment Pain Score: 0-No pain  Therapy/Group: Individual Therapy  Catcher Dehoyos 12/06/2019, 12:43 PM

## 2019-12-07 ENCOUNTER — Inpatient Hospital Stay (HOSPITAL_COMMUNITY): Payer: Self-pay | Admitting: Speech Pathology

## 2019-12-07 ENCOUNTER — Inpatient Hospital Stay (HOSPITAL_COMMUNITY): Payer: Self-pay | Admitting: Physical Therapy

## 2019-12-07 ENCOUNTER — Inpatient Hospital Stay (HOSPITAL_COMMUNITY): Payer: Self-pay | Admitting: Occupational Therapy

## 2019-12-07 MED ORDER — SORBITOL 70 % SOLN: 30.0000 mL | Freq: Every day | Status: DC | PRN

## 2019-12-07 MED ORDER — TRAZODONE HCL 50 MG PO TABS: 50.0000 mg | ORAL_TABLET | Freq: Every evening | ORAL | Status: DC | PRN

## 2019-12-07 MED ORDER — SENNOSIDES-DOCUSATE SODIUM 8.6-50 MG PO TABS
2.0000 | ORAL_TABLET | Freq: Every day | ORAL | Status: DC
  Administered 2019-12-07 – 2019-12-16 (×10): 2 via ORAL
  Filled 2019-12-07 (×10): qty 2

## 2019-12-07 MED ORDER — TRAZODONE HCL 50 MG PO TABS
50.0000 mg | ORAL_TABLET | Freq: Every day | ORAL | Status: DC
  Administered 2019-12-07: 50 mg via ORAL
  Filled 2019-12-07: qty 1

## 2019-12-07 NOTE — Progress Notes (Signed)
[facility] PHYSICAL MEDICINE & REHABILITATION PROGRESS NOTE   Subjective/Complaints: Pt fatigued this morning. Didn't sleep all that well last night.   ROS: Patient denies fever, rash, sore throat, blurred vision, nausea, vomiting, diarrhea, cough, shortness of breath or chest pain,   or mood change.   Objective:   No results found. Recent Labs    12/04/19 1920 12/05/19 0724  WBC 12.5* 11.3*  HGB 13.5 13.8  HCT 38.7* 39.4  PLT 582* 616*   Recent Labs    12/04/19 1920 12/05/19 0724  NA  --  131*  K  --  3.4*  CL  --  100  CO2  --  20*  GLUCOSE  --  114*  BUN  --  5*  CREATININE 0.77 0.66  CALCIUM  --  10.0    Intake/Output Summary (Last 24 hours) at 12/07/2019 1054 Last data filed at 12/07/2019 0858 Gross per 24 hour  Intake 230 ml  Output 875 ml  Net -645 ml        Physical Exam: Vital Signs Blood pressure 104/72, pulse (!) 102, temperature 98.2 F (36.8 C), resp. rate 18, height 5\' 7"  (1.702 m), SpO2 100 %. Constitutional: No distress . Vital signs reviewed. HEENT: EOMI, oral membranes moist Neck: supple Cardiovascular: RRR without murmur. No JVD    Respiratory/Chest: CTA Bilaterally without wheezes or rales. Normal effort    GI/Abdomen: BS +, non-tender, non-distended Ext: no clubbing, cyanosis, or edema Psych: flat but cooperative Skin:crani incision CDI, abdominal incision CDI both with staples. Some flap swelling persists.  Neuro: awakens easily.  follows basic commands. Moves all 4's L>R.. Language barrier to a degree, but communicates pretty effectively in spanish Musculoskeletal: normal ROM     Assessment/Plan: 1. 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  Physiatrist is providing close team supervision and 24 hour management of active medical problems listed below.  Physiatrist and rehab team continue to assess barriers to  discharge/monitor patient progress toward functional and medical goals  Care Tool:  Bathing    Body parts bathed by patient: Right arm, Left arm, Chest, Abdomen, Front perineal area, Buttocks, Right upper leg, Left upper leg, Right lower leg, Left lower leg, Face         Bathing assist Assist Level: Contact Guard/Touching assist     Upper Body Dressing/Undressing Upper body dressing   What is the patient wearing?: Pull over shirt    Upper body assist Assist Level: Supervision/Verbal cueing    Lower Body Dressing/Undressing Lower body dressing      What is the patient wearing?: Pants     Lower body assist Assist for lower body dressing: Contact Guard/Touching assist     Toileting Toileting    Toileting assist Assist for toileting: Contact Guard/Touching assist Assistive Device Comment: urinal   Transfers Chair/bed transfer  Transfers assist     Chair/bed transfer assist level: Minimal Assistance - Patient > 75%     Locomotion Ambulation   Ambulation assist      Assist level: Minimal Assistance - Patient > 75% Assistive device: No Device Max distance: 150   Walk 10 feet activity   Assist     Assist level: Minimal Assistance - Patient > 75% Assistive device: No Device   Walk 50 feet activity   Assist    Assist level: Minimal Assistance - Patient > 75% Assistive device: No Device    Walk 150  feet activity   Assist    Assist level: Minimal Assistance - Patient > 75% Assistive device: No Device    Walk 10 feet on uneven surface  activity   Assist     Assist level: Minimal Assistance - Patient > 75% Assistive device:  (none)   Wheelchair     Assist Will patient use wheelchair at discharge?: No Type of Wheelchair: Manual    Wheelchair assist level: Minimal Assistance - Patient > 75% Max wheelchair distance: 150 ft    Wheelchair 50 feet with 2 turns activity    Assist        Assist Level: Minimal Assistance -  Patient > 75%   Wheelchair 150 feet activity     Assist      Assist Level: Minimal Assistance - Patient > 75%   Blood pressure 104/72, pulse (!) 102, temperature 98.2 F (36.8 C), resp. rate 18, height 5\' 7"  (1.702 m), SpO2 100 %.  Medical Problem List and Plan: 1.  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 Arnoldo Morale.             -patient may shower             -ELOS/Goals: minA in 10-14 days  --Continue CIR therapies including PT, OT, and SLP  2.  Antithrombotics: -DVT/anticoagulation: Lovenox initiated 12/02/2019.             -antiplatelet therapy: N/A 3. Pain Management: Topamax 75 mg twice daily, hydrocodone/Robaxin as needed.   -appears controlled at present 4. Mood: Advised emotional support             -antipsychotic agents: N/A 5. Neuropsych: This patient is capable of making decisions on his own behalf.  -will schedule trazodone for sleep 6. Skin/Wound Care: Wound care of abdominal and cranial sites  -both areas remain clean and dry with staples 7. Fluids/Electrolytes/Nutrition: encourage appropriate PO  -I personally reviewed the patient's labs today.    -mild hyponatremia--labs ordered for Monday 10/4 8.  Seizure prophylaxis.  Keppra 500 mg twice daily 9.  Urinary retention.  Urecholine 10 mg 3 times daily.    -pt is continent and emptying bladder  -dc urecholine 10.  Constipation.  MiraLAX daily. Colace 100mg  BID.    -10/1 last had bm 9/28---change colace to senna-s 11. Leukocytosis  -down to 11.3k   no s/s infection  -f/u Monday .  LOS: 3 days A FACE TO FACE EVALUATION WAS PERFORMED  Meredith Staggers 12/07/2019, 10:54 AM

## 2019-12-07 NOTE — Progress Notes (Signed)
Physical Therapy Session Note  Patient Details  Name: Jason Skinner MRN: [id number] Date of Birth: 02-08-1991  Today's Date: 12/07/2019 PT Individual Time: 1257-1405 PT Individual Time Calculation (min): 68 min   Short Term Goals: Week 1:  PT Short Term Goal 1 (Week 1): STG = LTG due to short ELOS.  Skilled Therapeutic Interventions/Progress Updates:  Pt received in bed & agreeable to tx. Supine<>sit with supervision, sit<>stand with CGA. In gym, attempted to have pt perform berg balance test but pt impulsive & with very poor sustained attention & attempting to walk despite PT educating him on balance testing. Attempts to complete Berg Balance Test deferred & pt ambulates 400 ft with close supervision<>CGA increasing to min assist as pt not attending to surroundings & PT providing max cuing to decrease gait speed with poor return demo. Pt demonstrates minimal to no LUE reciprocal arm swing during gait. Pt performs standing 6" step taps progressing to cone taps with focus on dynamic balance, weight shifting L<>R, and NMR. Pt requires MAX cuing to count repetitions. Pt engages in ball toss with additional dual cognitive task & has frequent LOB posterior/R with min assist to correct & great difficulty completing dual task. Pt completes 5x sit<>stand without BUE support with task focusing on equal weight bearing BLE & BLE strengthening. Pt very fatigued throughout session requiring encouragement for continued participation. Stand pivot w/c<>mat table without AD & min assist. Pt completes floor transfer x 2 with min assist overall; PT observed RLE weakness when trying to stand floor>mat during first trial. Pt utilized nu-step on level 5 x 10 minutes with all four extremities with task focusing on global strengthening, coordination of reciprocal movements, and NMR, as well as sustained attention to task. Gait dayroom>room without AD & min assist with pt able to pathfind to room without assist. Pt has  functional cervical ROM but keeps head/cervical spine aligned in neutral & rarely turns head to look L/R during session. At end of session pt left in bed with alarm set, call bell & all needs in reach.   Pt with frequent LOB to R (even lists R in sitting), more impulsivity, & worsening sustained attention during session compared to when PT evaluated him - MD made aware.  Therapy Documentation Precautions:  Precautions Precautions: Fall Restrictions Weight Bearing Restrictions: No   Pain: Denies pain   Therapy/Group: Individual Therapy  Waunita Schooner 12/07/2019, 2:08 PM

## 2019-12-07 NOTE — Progress Notes (Signed)
Pt stated he was "sleepy" this morning, he did not sleep much of the night. Spoke with PA Linna Hoff and he ordered 50 mg trazodone prn at bedtime. No other concerns to report.

## 2019-12-07 NOTE — IPOC Note (Signed)
Overall Plan of Care [facility]) Patient Details Name: Zhion Pevehouse MRN: [id number] DOB: 1990-05-23  Admitting Diagnosis: TBI (traumatic brain injury) [facility])  Hospital Problems: Principal Problem:   TBI (traumatic brain injury) [facility])     Functional Problem List: Nursing Bladder, Bowel, Edema, Endurance, Medication Management, Nutrition, Pain, Safety, Skin Integrity  PT Balance, Perception, Behavior, Safety, Sensory, Endurance, Motor  OT Balance, Cognition, Endurance, Motor, Safety, Vision  SLP Cognition  TR         Basic ADLs: OT Bathing, Dressing, Toileting     Advanced  ADLs: OT       Transfers: PT Bed Mobility, Bed to Chair, Car, [employer], Floor  OT Toilet, Tub/Shower     Locomotion: PT Ambulation, Stairs     Additional Impairments: OT None  SLP Social Cognition   Problem Solving, Social Interaction, Awareness  TR      Anticipated Outcomes Item Anticipated Outcome  Self Feeding independent  Swallowing      Basic self-care  supervision  Toileting  supervision   Bathroom Transfers supervision  Bowel/Bladder  Supervision to Mod I  Transfers  supervision with LRAD  Locomotion  supervision with LRAD  Communication     Cognition  Supervision  Pain  <3  Safety/Judgment  Supervision to mod I   Therapy Plan: PT Intensity: Minimum of 1-2 x/day ,45 to 90 minutes PT Frequency: 5 out of 7 days PT Duration Estimated Length of Stay: 7-10 days OT Intensity: Minimum of 1-2 x/day, 45 to 90 minutes OT Frequency: 5 out of 7 days OT Duration/Estimated Length of Stay: 10-12 days SLP Intensity: Minumum of 1-2 x/day, 30 to 90 minutes SLP Frequency: 3 to 5 out of 7 days SLP Duration/Estimated Length of Stay: 10-12 days   Due to the current state of emergency, patients may not be receiving their 3-hours of Medicare-mandated therapy.   Team Interventions: Nursing Interventions Patient/Family Education, Bladder Management, Bowel Management, Pain Management,  Medication Management, Skin Care/Wound Management, Discharge Planning, Psychosocial Support  PT interventions Ambulation/gait training, Community reintegration, DME/adaptive equipment instruction, Neuromuscular re-education, Psychosocial support, UE/LE Strength taining/ROM, Stair training, UE/LE Coordination activities, Therapeutic Activities, Skin care/wound management, Pain management, Functional electrical stimulation, Discharge planning, Balance/vestibular training, Cognitive remediation/compensation, Disease management/prevention, Functional mobility training, Patient/family education, Therapeutic Exercise, Splinting/orthotics, Visual/perceptual remediation/compensation  OT Interventions Balance/vestibular training, Cognitive remediation/compensation, Discharge planning, DME/adaptive equipment instruction, Functional mobility training, Psychosocial support, Therapeutic Activities, UE/LE Strength taining/ROM, Visual/perceptual remediation/compensation, UE/LE Coordination activities, Therapeutic Exercise, Self Care/advanced ADL retraining, Patient/family education, Neuromuscular re-education  SLP Interventions Cognitive remediation/compensation, Internal/external aids, Cueing hierarchy, Environmental controls, Therapeutic Activities, Functional tasks, Patient/family education  TR Interventions    SW/CM Interventions Discharge Planning, Psychosocial Support, Patient/Family Education   Barriers to Discharge MD  Medical stability  Nursing Inaccessible home environment, Decreased caregiver support, Home environment access/layout, Incontinence, Wound Care, Medication compliance, Nutrition means    PT      OT      SLP Decreased caregiver support    SW Decreased caregiver support, Lack of/limited family support Pt is uninsured. Pt case is pendign Personal assistant Discharge Planning: Destination: PT-Home ,OT- Home , SLP-Home Projected Follow-up: PT-Outpatient PT, 24 hour  supervision/assistance, OT-  Home health OT, SLP-Home Health SLP, Outpatient SLP, 24 hour supervision/assistance Projected Equipment Needs: PT-To be determined, OT- To be determined, SLP-None recommended by SLP Equipment Details: PT- , OT-  Patient/family involved in discharge planning: PT- Patient,  OT-Patient, SLP-Patient  MD ELOS: 10-12 days Medical Rehab Prognosis:  Excellent Assessment:  The patient has been admitted for CIR therapies with the diagnosis of 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 The team will be addressing functional mobility, strength, stamina, balance, safety, adaptive techniques and equipment, self-care, bowel and bladder mgt, patient and caregiver education, NMR, cognition, communication, community reentry. Goals have been set at supervision for mobility, self-care, cognition and communication.   Due to the current state of emergency, patients may not be receiving their 3 hours per day of Medicare-mandated therapy.    Meredith Staggers, MD, FAAPMR      See Team Conference Notes for weekly updates to the plan of care

## 2019-12-07 NOTE — Progress Notes (Signed)
Occupational Therapy Session Note  Patient Details  Name: Jason Skinner MRN: [id number] Date of Birth: 02-04-1991  Today's Date: 12/07/2019 OT Individual Time: 9476-5465 OT Individual Time Calculation (min): 57 min   Short Term Goals: Week 1:  OT Short Term Goal 1 (Week 1): Pt will demonstrate improved endurance to be able to complete all B/D within 15 minutes with no rest breaks. OT Short Term Goal 2 (Week 1): Pt will be able to ambulate to toilet with CGA with LRAD. OT Short Term Goal 3 (Week 1): Pt will complete toileting with S.  Skilled Therapeutic Interventions/Progress Updates:    Pt greeted in bed with no c/o pain. Declining toileting initially. Bed mobility completed with setup assistance and then he ambulated short distance to the w/c parked near sink using RW with Min A due to small lateral sways/small LOBs and decreased awareness from pt. Pt engaged in face washing with setup while seated, setup for doffing gripper socks and donning clean ones. Escorted him outdoors via w/c and for remainder of session focused on dynamic standing balance and activity tolerance. Pt ambulated using RW with Min A while outdoors, navigating over uneven brick or concrete terrain and up/down inclines, note that pt exhibits some Lt inattention when avoiding obstacles. Vcs required for recognizing these obstacles as well as for keeping the walker on the ground as he tends to lift it up. Min A to transfer to a bench with vcs for correct RW positioning when backing up to transfer surface. Pt participated in ball passes as well as ball kicking while seated. Pt then reported he felt tired and requested to return to his room. Ambulatory transfer to toilet completed after he returned to the unit, used RW with Min A. Pt with continent bladder void, noted that pt wasn't wearing brief but did not exhibit incontinence in pants. After handwashing at the sink, pt returned to bed. Encouraged him to sit up in the recliner  for lunch however pt refused. Pt remained comfortably in bed with all needs within reach and bed alarm set, lavender aromatherapy provided for his pillowcase to increase relaxation as he reported not sleeping well last night.   Interpretor present during session  Therapy Documentation Precautions:  Precautions Precautions: Fall Restrictions Weight Bearing Restrictions: No ADL: ADL Eating: Set up Grooming: Supervision/safety Where Assessed-Grooming: Standing at sink Upper Body Bathing: Supervision/safety Where Assessed-Upper Body Bathing: Edge of bed Lower Body Bathing: Contact guard Where Assessed-Lower Body Bathing: Edge of bed Upper Body Dressing: Supervision/safety Where Assessed-Upper Body Dressing: Edge of bed Lower Body Dressing: Contact guard Where Assessed-Lower Body Dressing: Edge of bed Toileting: Contact guard Where Assessed-Toileting: Glass blower/designer: Therapist, music Method: Counselling psychologist: Grab bars Tub/Shower Transfer: Unable to assess [employer] Transfer: Unable to assess     Therapy/Group: Individual Therapy  Makaylin Carlo A Coleby Yett 12/07/2019, 12:24 PM

## 2019-12-07 NOTE — Addendum Note (Signed)
Addendum  created 12/07/19 0825 by Albertha Ghee, MD   Attestation recorded in [place], [place] filed

## 2019-12-07 NOTE — Plan of Care (Signed)
  Problem: Consults Goal: RH GENERAL PATIENT EDUCATION Description: See Patient Education module for education specifics. Outcome: Progressing   Problem: RH SKIN INTEGRITY Goal: RH STG SKIN FREE OF INFECTION/BREAKDOWN Description: With min assist  Outcome: Progressing   Problem: RH KNOWLEDGE DEFICIT GENERAL Goal: RH STG INCREASE KNOWLEDGE OF SELF CARE AFTER HOSPITALIZATION Description: With min assist Outcome: Progressing   Problem: RH SAFETY Goal: RH STG ADHERE TO SAFETY PRECAUTIONS W/ASSISTANCE/DEVICE Description: STG Adhere to Safety Precautions With cues/reminders Assistance/Device. Outcome: Not Progressing Note: Patient educated to use of call bell when in bed to ask for help. Patient continues to try and to get out of bed before attended to.

## 2019-12-07 NOTE — Progress Notes (Signed)
Speech Language Pathology Daily Session Note  Patient Details  Name: Jason Skinner MRN: [id number] Date of Birth: 18-Jan-1991  Today's Date: 12/07/2019 SLP Individual Time: 1093-2355 SLP Individual Time Calculation (min): 55 min  Short Term Goals: Week 1: SLP Short Term Goal 1 (Week 1): Patient will demonstrate functional problem solving for basic and mildly complex tasks with Min A verbal cues. SLP Short Term Goal 2 (Week 1): Patient will attend/scan to right field of enviornment during functional tasks with Min A verbal and visual cues. SLP Short Term Goal 3 (Week 1): Patient will self-monitor and correct errors during functional tasks with Min A verbal cues.  Skilled Therapeutic Interventions: Skilled treatment session focused on cognitive goals. Patient reported he was, "ready to work" and transferred to the wheelchair with supervision. Patient required encouragement to participate in basic self-care tasks like brushing his teeth. Supervision verbal cues were needed for functional problem solving and for cessation of tasks. Patient participated in a mildly complex problem solving task of organizing a 3X/week pill box with overall supervision level verbal cues and extra time. Patient transferred back to bed at end of session. Patient left supine in bed with alarm on and all needs within reach. Continue with current plan of care.      Pain Pain Assessment Pain Scale: 0-10 Pain Score: 0-No pain  Therapy/Group: Individual Therapy  Estephan Gallardo 12/07/2019, 12:42 PM

## 2019-12-08 ENCOUNTER — Inpatient Hospital Stay (HOSPITAL_COMMUNITY): Payer: Self-pay

## 2019-12-08 ENCOUNTER — Inpatient Hospital Stay (HOSPITAL_COMMUNITY): Payer: Self-pay | Admitting: Physical Therapy

## 2019-12-08 MED ORDER — TRAZODONE HCL 50 MG PO TABS
100.0000 mg | ORAL_TABLET | Freq: Every day | ORAL | Status: DC
  Administered 2019-12-08: 100 mg via ORAL
  Filled 2019-12-08: qty 2

## 2019-12-08 NOTE — Progress Notes (Signed)
Occupational Therapy Session Note  Patient Details  Name: Jason Skinner MRN: [id number] Date of Birth: 07/04/1990  Today's Date: 12/08/2019 OT Individual Time: 1300-1359 OT Individual Time Calculation (min): 59 min    Short Term Goals: Week 1:  OT Short Term Goal 1 (Week 1): Pt will demonstrate improved endurance to be able to complete all B/D within 15 minutes with no rest breaks. OT Short Term Goal 2 (Week 1): Pt will be able to ambulate to toilet with CGA with LRAD. OT Short Term Goal 3 (Week 1): Pt will complete toileting with S.  Skilled Therapeutic Interventions/Progress Updates:    1:1. Pt received in bed agreeable to shower. No water was placed on head in shower and occlusives applied to keep abdominal incision clean/dry. Pt completes doffing clothing sit to stand with CGA for standing balace using rail to doff pants over feet. Pt bathes with VC for sequenicng (bathing feet and buttocks) and VC for termination of shower after pt washes arms and legs 3x. Pt unable to recall events of previous sessions in conversations. Pt dresses sit to stand on BSC over toilet with S for footwear, MIN A for shirt (tight) and MIN A for standing balance (L LOB) for LB dressing sit to stand. Grooming seated at sink with VC for termination provided. Pt applies pillow cases to pillows with S while Ot changes bed sheets. Exited session with pt seated in bed, exit laarm on and call light in reach  Therapy Documentation Precautions:  Precautions Precautions: Fall Restrictions Weight Bearing Restrictions: No General:   Vital Signs:   Pain: Pain Assessment Pain Score: 0-No pain ADL: ADL Eating: Set up Grooming: Supervision/safety Where Assessed-Grooming: Standing at sink Upper Body Bathing: Supervision/safety Where Assessed-Upper Body Bathing: Edge of bed Lower Body Bathing: Contact guard Where Assessed-Lower Body Bathing: Edge of bed Upper Body Dressing: Supervision/safety Where  Assessed-Upper Body Dressing: Edge of bed Lower Body Dressing: Contact guard Where Assessed-Lower Body Dressing: Edge of bed Toileting: Contact guard Where Assessed-Toileting: Glass blower/designer: Therapist, music Method: Counselling psychologist: Energy manager: Unable to assess [employer] Transfer: Unable to assess Vision   Perception    Praxis   Exercises:   Other Treatments:     Therapy/Group: Individual Therapy  Tonny Branch 12/08/2019, 1:00 PM

## 2019-12-08 NOTE — Progress Notes (Signed)
Physical Therapy Session Note  Patient Details  Name: Jason Skinner MRN: [id number] Date of Birth: 10-12-1990  Today's Date: 12/08/2019 PT Individual Time: 0935-1030 PT Individual Time Calculation (min): 55 min   Short Term Goals: Week 1:  PT Short Term Goal 1 (Week 1): STG = LTG due to short ELOS.  Skilled Therapeutic Interventions/Progress Updates:   Pt received supine in bed and agreeable to PT. Supine>sit transfer without assist or cues. Stand pivot transfer to [facility] with CGA for safety. PT isntructred pt in gait training through hall x 167f, 303f and 7571fith CGA from PT throughut due to mild R lateral lean and occasional scissoring with the RLE. PT instructed pt in dual task/ dynamic balance training to kick soccer ball agaist wall and maintain balance, min assist to prevent posterior LOB pt dribbled ball with feet aroud 1 cone and back to chair x 3 and then around 6 cones back to chair with min assist from PT intermittently to prevent lateral R and posterior LOB. Pt able to make appropriate COM adjustements to control ball within and outside BOS 75% of the time without additional assist from PT and only required min assist to maintain adequate control of soccer ball.  Pt also performed balance training while completing jenga puzzle with PT x 3 rounds with min assist occasionally to prevent posterior L LOB while engaged in cognitive task. Pt demonstrated adequate problem solving throughout. Pt returned to room and performed stand pivot transfer to bed with CGA. Sit>supine completed without assist, and left supine in bed with call bell in reach and all needs met.         Therapy Documentation Precautions:  Precautions Precautions: Fall Restrictions Weight Bearing Restrictions: No    Pain: Pain Assessment Pain Scale: 0-10 Pain Score: 0-No pain   Therapy/Group: Individual Therapy  AusLorie Phenix/04/2019, 10:38 AM

## 2019-12-08 NOTE — Progress Notes (Signed)
Speech Language Pathology Daily Session Note  Patient Details  Name: Jason Skinner MRN: [id number] Date of Birth: 1991/01/16  Today's Date: 12/08/2019 SLP Individual Time: 1100-1157 SLP Individual Time Calculation (min): 57 min  Short Term Goals: Week 1: SLP Short Term Goal 1 (Week 1): Patient will demonstrate functional problem solving for basic and mildly complex tasks with Min A verbal cues. SLP Short Term Goal 2 (Week 1): Patient will attend/scan to right field of enviornment during functional tasks with Min A verbal and visual cues. SLP Short Term Goal 3 (Week 1): Patient will self-monitor and correct errors during functional tasks with Min A verbal cues.  Skilled Therapeutic Interventions:Skilled ST services focused on cognitive skills. Pt demonstrated recall of medications name/function/times per day given list and required supervision A verbal cues for verbal problem solving , times per day. SLP facilitated mildly complex problem solving and error awareness skills in account balancing task, max A fade to mod A verbal cues to continue balancing, mod A fade to min A verbal cues to organize and mod A fade to supervision A verbal cues to problem solve. SLP also facilitated mildly complex problem solving and error awareness in novel PEG design task, pt required supervision A verbal cues for error awareness. Pt was left in room with call bell within reach and bed alarm set. SLP recommends to continue skilled services.     Pain    Therapy/Group: Individual Therapy  Ambermarie Honeyman  [facility] 12/08/2019, 7:59 AM

## 2019-12-08 NOTE — Progress Notes (Signed)
[place] PHYSICAL MEDICINE & REHABILITATION PROGRESS NOTE   Subjective/Complaints: +insomnia: increased trazodone to 100mg  for tonight. No other complaints  ROS: Patient denies fever, rash, sore throat, blurred vision, nausea, vomiting, diarrhea, cough, shortness of breath or chest pain,   or mood change.   Objective:   No results found. No results for input(s): WBC, HGB, HCT, PLT in the last 72 hours. No results for input(s): NA, K, CL, CO2, GLUCOSE, BUN, CREATININE, CALCIUM in the last 72 hours.  Intake/Output Summary (Last 24 hours) at 12/08/2019 1700 Last data filed at 12/08/2019 1300 Gross per 24 hour  Intake 596 ml  Output 550 ml  Net 46 ml        Physical Exam: Vital Signs Blood pressure 104/68, pulse 97, temperature 97.7 F (36.5 C), resp. rate 16, height 5\' 7"  (1.702 m), SpO2 100 %.  General: Alert, No apparent distress HEENT: Head is normocephalic, atraumatic, PERRLA, EOMI, sclera anicteric, oral mucosa pink and moist, dentition intact, ext ear canals clear,  Neck: Supple without JVD or lymphadenopathy Heart: Reg rate and rhythm. No murmurs rubs or gallops Chest: CTA bilaterally without wheezes, rales, or rhonchi; no distress Abdomen: Soft, non-tender, non-distended, bowel sounds positive.  Psych: flat but cooperative Skin:crani incision CDI, abdominal incision CDI both with staples. Some flap swelling persists.  Neuro: awakens easily.  follows basic commands. Moves all 4's L>R.. Language barrier to a degree, but communicates pretty effectively in spanish Musculoskeletal: normal ROM     Assessment/Plan: 1. 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  Physiatrist is providing close team supervision and 24 hour management of active medical problems listed below.  Physiatrist and rehab team continue to assess barriers to discharge/monitor patient  progress toward functional and medical goals  Care Tool:  Bathing    Body parts bathed by patient: Right arm, Left arm, Chest, Abdomen, Front perineal area, Buttocks, Right upper leg, Left upper leg, Right lower leg, Left lower leg, Face         Bathing assist Assist Level: Contact Guard/Touching assist     Upper Body Dressing/Undressing Upper body dressing   What is the patient wearing?: Pull over shirt    Upper body assist Assist Level: Supervision/Verbal cueing    Lower Body Dressing/Undressing Lower body dressing      What is the patient wearing?: Pants     Lower body assist Assist for lower body dressing: Contact Guard/Touching assist     Toileting Toileting    Toileting assist Assist for toileting: Contact Guard/Touching assist Assistive Device Comment: urinal   Transfers Chair/bed transfer  Transfers assist     Chair/bed transfer assist level: Minimal Assistance - Patient > 75%     Locomotion Ambulation   Ambulation assist      Assist level: Minimal Assistance - Patient > 75% Assistive device: No Device Max distance: 400 ft   Walk 10 feet activity   Assist     Assist level: Minimal Assistance - Patient > 75% Assistive device: No Device   Walk 50 feet activity   Assist    Assist level: Minimal Assistance - Patient > 75% Assistive device: No Device    Walk 150 feet activity   Assist    Assist level: Minimal Assistance - Patient > 75% Assistive device: No Device    Walk 10 feet on uneven surface  activity   Assist     Assist level: Minimal  Assistance - Patient > 75% Assistive device:  (none)   Wheelchair     Assist Will patient use wheelchair at discharge?: No Type of Wheelchair: Manual    Wheelchair assist level: Minimal Assistance - Patient > 75% Max wheelchair distance: 150 ft    Wheelchair 50 feet with 2 turns activity    Assist        Assist Level: Minimal Assistance - Patient > 75%    Wheelchair 150 feet activity     Assist      Assist Level: Minimal Assistance - Patient > 75%   Blood pressure 104/68, pulse 97, temperature 97.7 F (36.5 C), resp. rate 16, height 5\' 7"  (1.702 m), SpO2 100 %.  Medical Problem List and Plan: 1.  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 Arnoldo Morale.             -patient may shower             -ELOS/Goals: minA in 10-14 days  --Continue CIR therapies including PT, OT, and SLP  2.  Antithrombotics: -DVT/anticoagulation: Lovenox initiated 12/02/2019- discontinued as ambulating >558feet             -antiplatelet therapy: N/A 3. Pain Management: Topamax 75 mg twice daily, hydrocodone/Robaxin as needed. -appears controlled 4. Mood: Advised emotional support             -antipsychotic agents: N/A 5. Neuropsych: This patient is capable of making decisions on his own behalf.  -will schedule trazodone for sleep 6. Skin/Wound Care: Wound care of abdominal and cranial sites  -both areas remain clean and dry with staples 7. Fluids/Electrolytes/Nutrition: encourage appropriate PO  -I personally reviewed the patient's labs today.    -mild hyponatremia--labs ordered for Monday 10/4 8.  Seizure prophylaxis.  Keppra 500 mg twice daily 9.  Urinary retention.  Urecholine 10 mg 3 times daily.    -pt is continent and emptying bladder  -dc urecholine 10.  Constipation.  MiraLAX daily. Colace 100mg  BID.    -10/1 last had bm 9/28---change colace to senna-s 11. Leukocytosis  -down to 11.3k   no s/s infection  -f/u Monday . 12. Insomnia: increase trazodone to 100mg  HS  LOS: 4 days A FACE TO FACE EVALUATION WAS PERFORMED  Jason Skinner 12/08/2019, 5:00 PM

## 2019-12-09 MED ORDER — TRAZODONE HCL 50 MG PO TABS
150.0000 mg | ORAL_TABLET | Freq: Every day | ORAL | Status: DC
  Administered 2019-12-09 – 2019-12-11 (×3): 150 mg via ORAL
  Filled 2019-12-09 (×3): qty 3

## 2019-12-09 NOTE — Progress Notes (Signed)
[place] PHYSICAL MEDICINE & REHABILITATION PROGRESS NOTE   Subjective/Complaints: Having BM now Pain is well controlled Still not sleeping with trazodone increase- will increase further  ROS: Patient denies fever, rash, sore throat, blurred vision, nausea, vomiting, diarrhea, cough, shortness of breath or chest pain,   or mood change.   Objective:   No results found. No results for input(s): WBC, HGB, HCT, PLT in the last 72 hours. No results for input(s): NA, K, CL, CO2, GLUCOSE, BUN, CREATININE, CALCIUM in the last 72 hours.  Intake/Output Summary (Last 24 hours) at 12/09/2019 1302 Last data filed at 12/09/2019 1243 Gross per 24 hour  Intake 337 ml  Output 300 ml  Net 37 ml        Physical Exam: Vital Signs Blood pressure 101/81, pulse 90, temperature 98.2 F (36.8 C), resp. rate 18, height 5\' 7"  (1.702 m), SpO2 100 %.   General: Alert, No apparent distress HEENT: Head is normocephalic, atraumatic, PERRLA, EOMI, sclera anicteric, oral mucosa pink and moist, dentition intact, ext ear canals clear,  Neck: Supple without JVD or lymphadenopathy Heart: Reg rate and rhythm. No murmurs rubs or gallops Chest: CTA bilaterally without wheezes, rales, or rhonchi; no distress Abdomen: Soft, non-tender, non-distended, bowel sounds positive. Extremities: No clubbing, cyanosis, or edema. Pulses are 2+  Psych: flat but cooperative Skin:crani incision CDI, abdominal incision CDI both with staples. Some flap swelling persists.  Neuro: awakens easily.  follows basic commands. Moves all 4's L>R.. Language barrier to a degree, but communicates pretty effectively in spanish Musculoskeletal: normal ROM     Assessment/Plan: 1. 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  Physiatrist is providing close team supervision and 24 hour management of active medical problems listed  below.  Physiatrist and rehab team continue to assess barriers to discharge/monitor patient progress toward functional and medical goals  Care Tool:  Bathing    Body parts bathed by patient: Right arm, Left arm, Chest, Abdomen, Front perineal area, Buttocks, Right upper leg, Left upper leg, Right lower leg, Left lower leg, Face         Bathing assist Assist Level: Contact Guard/Touching assist     Upper Body Dressing/Undressing Upper body dressing   What is the patient wearing?: Pull over shirt    Upper body assist Assist Level: Supervision/Verbal cueing    Lower Body Dressing/Undressing Lower body dressing      What is the patient wearing?: Pants     Lower body assist Assist for lower body dressing: Contact Guard/Touching assist     Toileting Toileting    Toileting assist Assist for toileting: Contact Guard/Touching assist Assistive Device Comment: urinal   Transfers Chair/bed transfer  Transfers assist     Chair/bed transfer assist level: Minimal Assistance - Patient > 75%     Locomotion Ambulation   Ambulation assist      Assist level: Minimal Assistance - Patient > 75% Assistive device: No Device Max distance: 400 ft   Walk 10 feet activity   Assist     Assist level: Minimal Assistance - Patient > 75% Assistive device: No Device   Walk 50 feet activity   Assist    Assist level: Minimal Assistance - Patient > 75% Assistive device: No Device    Walk 150 feet activity   Assist    Assist level: Minimal Assistance - Patient > 75% Assistive device: No Device    Walk 10  feet on uneven surface  activity   Assist     Assist level: Minimal Assistance - Patient > 75% Assistive device:  (none)   Wheelchair     Assist Will patient use wheelchair at discharge?: No Type of Wheelchair: Manual    Wheelchair assist level: Minimal Assistance - Patient > 75% Max wheelchair distance: 150 ft    Wheelchair 50 feet with 2 turns  activity    Assist        Assist Level: Minimal Assistance - Patient > 75%   Wheelchair 150 feet activity     Assist      Assist Level: Minimal Assistance - Patient > 75%   Blood pressure 101/81, pulse 90, temperature 98.2 F (36.8 C), resp. rate 18, height 5\' 7"  (1.702 m), SpO2 100 %.  Medical Problem List and Plan: 1.  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 Arnoldo Morale.             -patient may shower             -ELOS/Goals: minA in 10-14 days  --Continue CIR therapies including PT, OT, and SLP  2.  Antithrombotics: -DVT/anticoagulation: Lovenox initiated 12/02/2019- discontinued as ambulating >567feet             -antiplatelet therapy: N/A 3. Pain Management: Topamax 75 mg twice daily, hydrocodone/Robaxin as needed. -well controlled 4. Mood: Advised emotional support. In positive mood             -antipsychotic agents: N/A 5. Neuropsych: This patient is capable of making decisions on his own behalf.  -will schedule trazodone for sleep 6. Skin/Wound Care: Wound care of abdominal and cranial sites  -both areas remain clean and dry with staples 7. Fluids/Electrolytes/Nutrition: encourage appropriate PO  -I personally reviewed the patient's labs today.    -mild hyponatremia--labs ordered for Monday 10/4 8.  Seizure prophylaxis.  Keppra 500 mg twice daily 9.  Urinary retention.  Urecholine 10 mg 3 times daily.    -pt is continent and emptying bladder  -dc urecholine 10.  Constipation.  MiraLAX daily. Colace 100mg  BID.    -10/1 last had bm 9/28---change colace to senna-s 11. Leukocytosis  -down to 11.3k   no s/s infection  -f/u Monday . 12. Insomnia: increase trazodone to 150mg .   LOS: 5 days A FACE TO FACE EVALUATION WAS PERFORMED  Andrian Urbach P Fabian Walder 12/09/2019, 1:02 PM

## 2019-12-10 ENCOUNTER — Inpatient Hospital Stay (HOSPITAL_COMMUNITY): Payer: Self-pay | Admitting: Occupational Therapy

## 2019-12-10 ENCOUNTER — Inpatient Hospital Stay (HOSPITAL_COMMUNITY): Payer: Self-pay | Admitting: Speech Pathology

## 2019-12-10 ENCOUNTER — Inpatient Hospital Stay (HOSPITAL_COMMUNITY): Payer: Self-pay | Admitting: Physical Therapy

## 2019-12-10 LAB — CBC
HCT: 37.7 % — ABNORMAL LOW (ref 39.0–52.0)
Hemoglobin: 12.8 g/dL — ABNORMAL LOW (ref 13.0–17.0)
MCH: 29.9 pg (ref 26.0–34.0)
MCHC: 34 g/dL (ref 30.0–36.0)
MCV: 88.1 fL (ref 80.0–100.0)
Platelets: 666 10*3/uL — ABNORMAL HIGH (ref 150–400)
RBC: 4.28 MIL/uL (ref 4.22–5.81)
RDW: 13.2 % (ref 11.5–15.5)
WBC: 9.5 10*3/uL (ref 4.0–10.5)
nRBC: 0 % (ref 0.0–0.2)

## 2019-12-10 LAB — BASIC METABOLIC PANEL
Anion gap: 12 (ref 5–15)
BUN: 6 mg/dL (ref 6–20)
CO2: 21 mmol/L — ABNORMAL LOW (ref 22–32)
Calcium: 9.8 mg/dL (ref 8.9–10.3)
Chloride: 101 mmol/L (ref 98–111)
Creatinine, Ser: 0.84 mg/dL (ref 0.61–1.24)
GFR calc Af Amer: >60 mL/min (ref >60)
GFR calc non Af Amer: >60 mL/min (ref >60)
Glucose, Bld: 100 mg/dL — ABNORMAL HIGH (ref 70–99)
Potassium: 3 mmol/L — ABNORMAL LOW (ref 3.5–5.1)
Sodium: 134 mmol/L — ABNORMAL LOW (ref 135–145)

## 2019-12-10 MED ORDER — POTASSIUM CHLORIDE CRYS ER 20 MEQ PO TBCR
20.0000 meq | EXTENDED_RELEASE_TABLET | Freq: Two times a day (BID) | ORAL | Status: DC
  Administered 2019-12-10 – 2019-12-14 (×9): 20 meq via ORAL
  Filled 2019-12-10 (×9): qty 1

## 2019-12-10 NOTE — Progress Notes (Signed)
Physical Therapy Session Note  Patient Details  Name: Jason Skinner MRN: [id number] Date of Birth: [birth year]  Today's Date: 12/10/2019 PT Individual Time: 0263-7858 PT Individual Time Calculation (min): 71 min   Short Term Goals: Week 1:  PT Short Term Goal 1 (Week 1): STG = LTG due to short ELOS.  Skilled Therapeutic Interventions/Progress Updates:  Interpreter present for session. Pt received in bed with brother in room without mask donned - PT educates him on mask policy & he dons one. Pt asleep but easily awakened & agreeable to tx. Supine<>sit with supervision, bed rails, bed flat. Sit<>stand throughout session with CGA, short distance gait to w/c without AD & min assist. Session focused strongly on balance, midline orientation & NMR as pt demonstrates R lateral lean and decreased weight shift to L during standing/gait. Pt negotiates 16 steps (6") with B rails and CGA with step to pattern. Pt engages in trampoline ball toss while standing on airex with 1kg ball, gait while balancing ball on racket (drops ball 3x), gait through obstacle course (weaving between cones, stepping over step, stepping over pole) with cuing for increased turn width with fair return demo. Pt also performs ladder walks - forwards/backwards and laterally without AD & min assist. Standing cone taps with focus on dynamic balance & weight shifting, LE NMR & coordination. Gait up to 300 ft without AD & min assist. Intermittently utilized mirror for visual feedback when pt begins to lean R. Pt with poor attention to environment & task and during gait will ambulate with no destination. Pt performs 10 reps x 2 sets of sit<>stand with RLE elevated on 6" step with focus on increase weight shifting & weight bearing to LLE. Pt with no verbal communication during session and reports he feels impaired midline orientation but unsure if pt actually does. Back in room PT educates pt's brother on impaired midline orientation & balance  & focus of tx. At end of session pt left in bed with alarm set, call bell in reach, brother present in room.  Therapy Documentation Precautions:  Precautions Precautions: Fall Restrictions Weight Bearing Restrictions: No  Pain: No c/o pain reported   Therapy/Group: Individual Therapy  Waunita Schooner 12/10/2019, 4:00 PM

## 2019-12-10 NOTE — Progress Notes (Signed)
Patient ID: Jason Skinner, male   DOB: January 29, 1991, 28 y.o.   MRN: [id number]  SW left message for Terry/RN CM with [employer]  [phone]) to discuss patient case and see if there were any updates with WC. SW waiting on follow-up.  Pt remains uninsured at this time.  Loralee Pacas, MSW, [place] Office: [phone] Cell: [phone] Fax: [phone]

## 2019-12-10 NOTE — Progress Notes (Signed)
Speech Language Pathology Daily Session Note  Patient Details  Name: Jason Skinner MRN: [id number] Date of Birth: [birth year]  Today's Date: 12/10/2019 SLP Individual Time: 3235-5732 SLP Individual Time Calculation (min): 53 min  Short Term Goals: Week 1: SLP Short Term Goal 1 (Week 1): Patient will demonstrate functional problem solving for basic and mildly complex tasks with Min A verbal cues. SLP Short Term Goal 2 (Week 1): Patient will attend/scan to right field of enviornment during functional tasks with Min A verbal and visual cues. SLP Short Term Goal 3 (Week 1): Patient will self-monitor and correct errors during functional tasks with Min A verbal cues.  Skilled Therapeutic Interventions: Skilled treatment session focused on cognitive goals. SLP facilitated session by providing Min A verbal cues for both initiation of functional tasks and initiation of verbal responses. Patient participated in a 4 and 6 step picture sequencing task with overall Mod A verbal and visual cues to self-monitor and correct errors. Patient attended to the right field of environment during task with supervision verbal cues. Patient appeared lethargic and required encouragement for participation as patient as keeping his eyes closed and attempted to lower the head of bed. Question how much patient is sleeping at night and its impact on his overall cognitive functioning. Patient left supine in bed with alarm on and all needs within reach. Continue with current plan of care.      Pain Pain Assessment Pain Score: 0-No pain  Therapy/Group: Individual Therapy  Keona Sheffler 12/10/2019, 12:51 PM

## 2019-12-10 NOTE — Progress Notes (Signed)
Occupational Therapy Session Note  Patient Details  Name: Jason Skinner MRN: [id number] Date of Birth: 13-Jul-1990  Today's Date: 12/10/2019 OT Individual Time: 1100-1200 OT Individual Time Calculation (min): 60 min    Short Term Goals: Week 1:  OT Short Term Goal 1 (Week 1): Pt will demonstrate improved endurance to be able to complete all B/D within 15 minutes with no rest breaks. OT Short Term Goal 2 (Week 1): Pt will be able to ambulate to toilet with CGA with LRAD. OT Short Term Goal 3 (Week 1): Pt will complete toileting with S.      Skilled Therapeutic Interventions/Progress Updates:    Pt seen this session to focus on strength, endurance, balance, attention to task.  Interpretor present.  Pt received in bed and agreeable to therapy. Sat to EOB and completed stand pivot to wc to complete self care at the sink.   He stated he was very cold in the shower the other day and preferred a sink bath.  With all of his transfers today, he only needed guarding A.  One time, he stood up to pull pants over hips and needed CGA as he somewhat stumbled.  He seems to be gaining more RLE strength, but he has increased R lean in sitting which was not noticed on the evaluation.  When cued that he is leaning R, pt is able to self correct.   Completed b/d with close S to [place].  Taken to [employer] to work on cognitive and UE AROM task. Pt transferred to therapy mat stand pivot with close S. Using zoom ball game, pt had to name foods in order of the alphabet. He did fairly well even interchanging [place] and [place].  For the letter A, he said "manzana" which is apple in [place].  Good attention to task, but needed frequent cues to correct his posture as he was leaning to the R.  Placed mirror in front of pt and he was able to maintain posture with visual feedback.  Worked on dynamic reaching R and L to challenge R trunk muscles.  B overhead reaches with the ball for trunk elongation.     Transferred back to chair and taken back to room. Pt stated he did not need to toilet, wanted to lay down.  Transferred to bed with alarm set and all needs met.   Therapy Documentation Precautions:  Precautions Precautions: Fall Restrictions Weight Bearing Restrictions: No   Pain: Pain Assessment Pain Scale: 0-10 Pain Score: 0-No pain ADL: ADL Eating: Set up Grooming: Supervision/safety Where Assessed-Grooming: Standing at sink Upper Body Bathing: Supervision/safety Where Assessed-Upper Body Bathing: Edge of bed Lower Body Bathing: Contact guard Where Assessed-Lower Body Bathing: Edge of bed Upper Body Dressing: Supervision/safety Where Assessed-Upper Body Dressing: Edge of bed Lower Body Dressing: Contact guard Where Assessed-Lower Body Dressing: Edge of bed Toileting: Contact guard Where Assessed-Toileting: Glass blower/designer: Therapist, music Method: Counselling psychologist: Grab bars Tub/Shower Transfer: Unable to assess [employer] Transfer: Unable to assess   Therapy/Group: Individual Therapy  [place] 12/10/2019, 9:43 AM

## 2019-12-10 NOTE — Progress Notes (Signed)
[place] PHYSICAL MEDICINE & REHABILITATION PROGRESS NOTE   Subjective/Complaints: Slept a little better? Still with mild headache  ROS: Patient denies fever, rash, sore throat, blurred vision, nausea, vomiting, diarrhea, cough, shortness of breath or chest pain  or mood change.    Objective:   No results found. Recent Labs    12/10/19 0557  WBC 9.5  HGB 12.8*  HCT 37.7*  PLT 666*   Recent Labs    12/10/19 0557  NA 134*  K 3.0*  CL 101  CO2 21*  GLUCOSE 100*  BUN 6  CREATININE 0.84  CALCIUM 9.8    Intake/Output Summary (Last 24 hours) at 12/10/2019 1014 Last data filed at 12/09/2019 1900 Gross per 24 hour  Intake 337 ml  Output --  Net 337 ml        Physical Exam: Vital Signs Blood pressure 98/60, pulse 80, temperature 98.1 F (36.7 C), resp. rate 16, height 5\' 7"  (1.702 m), SpO2 99 %.   Constitutional: No distress . Vital signs reviewed. HEENT: EOMI, oral membranes moist Neck: supple Cardiovascular: RRR without murmur. No JVD    Respiratory/Chest: CTA Bilaterally without wheezes or rales. Normal effort    GI/Abdomen: BS +, non-tender, non-distended Ext: no clubbing, cyanosis, or edema Psych: flat.  Skin:crani incision CDI, abdominal incision CDI both with staples. Some swelling around flap still present.  Neuro: awakens easily.  follows basic commands. Moves all 4's L>R.. fair insight and awareness Musculoskeletal: normal ROM     Assessment/Plan: 1. 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  Physiatrist is providing close team supervision and 24 hour management of active medical problems listed below.  Physiatrist and rehab team continue to assess barriers to discharge/monitor patient progress toward functional and medical goals  Care Tool:  Bathing    Body parts bathed by patient: Right arm, Left arm, Chest, Abdomen, Front  perineal area, Buttocks, Right upper leg, Left upper leg, Right lower leg, Left lower leg, Face         Bathing assist Assist Level: Contact Guard/Touching assist     Upper Body Dressing/Undressing Upper body dressing   What is the patient wearing?: Pull over shirt    Upper body assist Assist Level: Supervision/Verbal cueing    Lower Body Dressing/Undressing Lower body dressing      What is the patient wearing?: Pants     Lower body assist Assist for lower body dressing: Contact Guard/Touching assist     Toileting Toileting    Toileting assist Assist for toileting: Contact Guard/Touching assist Assistive Device Comment: urinal   Transfers Chair/bed transfer  Transfers assist     Chair/bed transfer assist level: Minimal Assistance - Patient > 75%     Locomotion Ambulation   Ambulation assist      Assist level: Minimal Assistance - Patient > 75% Assistive device: No Device Max distance: 400 ft   Walk 10 feet activity   Assist     Assist level: Minimal Assistance - Patient > 75% Assistive device: No Device   Walk 50 feet activity   Assist    Assist level: Minimal Assistance - Patient > 75% Assistive device: No Device    Walk 150 feet activity   Assist    Assist level: Minimal Assistance - Patient > 75% Assistive device: No Device    Walk 10 feet on uneven surface  activity   Assist     Assist  level: Minimal Assistance - Patient > 75% Assistive device:  (none)   Wheelchair     Assist Will patient use wheelchair at discharge?: No Type of Wheelchair: Manual    Wheelchair assist level: Minimal Assistance - Patient > 75% Max wheelchair distance: 150 ft    Wheelchair 50 feet with 2 turns activity    Assist        Assist Level: Minimal Assistance - Patient > 75%   Wheelchair 150 feet activity     Assist      Assist Level: Minimal Assistance - Patient > 75%   Blood pressure 98/60, pulse 80, temperature  98.1 F (36.7 C), resp. rate 16, height 5\' 7"  (1.702 m), SpO2 99 %.  Medical Problem List and Plan: 1.  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 Arnoldo Morale.             -patient may shower             -ELOS/Goals: minA in 10-14 days  --Continue CIR therapies including PT, OT, and SLP  2.  Antithrombotics: -DVT/anticoagulation: Lovenox initiated 12/02/2019- discontinued as ambulating >562feet             -antiplatelet therapy: N/A 3. Pain Management: Topamax 75 mg twice daily, hydrocodone/Robaxin as needed. -well controlled 10/4 4. Mood: Advised emotional support. In positive mood             -antipsychotic agents: N/A 5. Neuropsych: This patient is capable of making decisions on his own behalf.  -will schedule trazodone for sleep 6. Skin/Wound Care: Wound care of abdominal and cranial sites  -both areas remain clean and dry with staples 7. Fluids/Electrolytes/Nutrition: encourage appropriate PO  -I personally reviewed the patient's labs today.    -mild hyponatremia---> improved to 134 10/4  -hypokalemia--> replace k+ 8.  Seizure prophylaxis.  Keppra 500 mg twice daily 9.  Urinary retention.  Urecholine 10 mg 3 times daily.    -pt is continent and emptying bladder  -off urecholine 10.  Constipation.  MiraLAX daily. Senna-s.    -moved bowels 10/3 11. Leukocytosis  -down further to 9.5 today 10/4 12. Insomnia: increased trazodone to 150mg  with some improvement---observe for day time sedation.   LOS: 6 days A FACE TO FACE EVALUATION WAS PERFORMED  Meredith Staggers 12/10/2019, 10:14 AM

## 2019-12-11 ENCOUNTER — Inpatient Hospital Stay (HOSPITAL_COMMUNITY): Payer: Self-pay | Admitting: Physical Therapy

## 2019-12-11 ENCOUNTER — Inpatient Hospital Stay (HOSPITAL_COMMUNITY): Payer: Self-pay | Admitting: Occupational Therapy

## 2019-12-11 ENCOUNTER — Inpatient Hospital Stay (HOSPITAL_COMMUNITY): Payer: Self-pay | Admitting: Speech Pathology

## 2019-12-11 NOTE — Progress Notes (Signed)
[place] PHYSICAL MEDICINE & REHABILITATION PROGRESS NOTE   Subjective/Complaints: Indicated to me that he slept ok. Very bright and alert today. Nurse reported otherwise  ROS: Patient denies fever, rash, sore throat, blurred vision, nausea, vomiting, diarrhea, cough, shortness of breath or chest pain, joint or back pain,   or mood change.    Objective:   No results found. Recent Labs    12/10/19 0557  WBC 9.5  HGB 12.8*  HCT 37.7*  PLT 666*   Recent Labs    12/10/19 0557  NA 134*  K 3.0*  CL 101  CO2 21*  GLUCOSE 100*  BUN 6  CREATININE 0.84  CALCIUM 9.8    Intake/Output Summary (Last 24 hours) at 12/11/2019 1248 Last data filed at 12/11/2019 0900 Gross per 24 hour  Intake 238 ml  Output --  Net 238 ml        Physical Exam: Vital Signs Blood pressure 93/64, pulse 79, temperature 98.5 F (36.9 C), resp. rate 17, height 5\' 7"  (1.702 m), weight 71.2 kg, SpO2 99 %.   Constitutional: No distress . Vital signs reviewed. HEENT: EOMI, oral membranes moist Neck: supple Cardiovascular: RRR without murmur. No JVD    Respiratory/Chest: CTA Bilaterally without wheezes or rales. Normal effort    GI/Abdomen: BS +, non-tender, non-distended Ext: no clubbing, cyanosis, or edema Psych: cooperative, more attentive Skin:crani incision CDI, abdominal incision CDI both with staples. Some swelling around flap still present but decreasing Neuro: alert and follows commands. Communicates in [place], some broke english.  follows basic commands. Moves all 4's L>R.. fair insight and awareness Musculoskeletal: normal ROM     Assessment/Plan: 1. 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  Physiatrist is providing close team supervision and 24 hour management of active medical problems listed below.  Physiatrist and rehab team continue to assess barriers to  discharge/monitor patient progress toward functional and medical goals  Care Tool:  Bathing    Body parts bathed by patient: Right arm, Left arm, Chest, Abdomen, Front perineal area, Buttocks, Right upper leg, Left upper leg, Right lower leg, Left lower leg, Face         Bathing assist Assist Level: Supervision/Verbal cueing     Upper Body Dressing/Undressing Upper body dressing   What is the patient wearing?: Pull over shirt    Upper body assist Assist Level: Supervision/Verbal cueing    Lower Body Dressing/Undressing Lower body dressing      What is the patient wearing?: Underwear/pull up, Pants     Lower body assist Assist for lower body dressing: Supervision/Verbal cueing     Toileting Toileting    Toileting assist Assist for toileting: Supervision/Verbal cueing Assistive Device Comment: urinal   Transfers Chair/bed transfer  Transfers assist     Chair/bed transfer assist level: Contact Guard/Touching assist     Locomotion Ambulation   Ambulation assist      Assist level: Minimal Assistance - Patient > 75% Assistive device: No Device Max distance: 150 ft   Walk 10 feet activity   Assist     Assist level: Minimal Assistance - Patient > 75% Assistive device: Walker-rolling   Walk 50 feet activity   Assist    Assist level: Minimal Assistance - Patient > 75% Assistive device: Walker-rolling    Walk 150 feet activity   Assist    Assist level: Minimal Assistance - Patient > 75% Assistive device: Walker-rolling  Walk 10 feet on uneven surface  activity   Assist     Assist level: Minimal Assistance - Patient > 75% Assistive device:  (none)   Wheelchair     Assist Will patient use wheelchair at discharge?: No Type of Wheelchair: Manual    Wheelchair assist level: Minimal Assistance - Patient > 75% Max wheelchair distance: 150 ft    Wheelchair 50 feet with 2 turns activity    Assist        Assist Level:  Minimal Assistance - Patient > 75%   Wheelchair 150 feet activity     Assist      Assist Level: Minimal Assistance - Patient > 75%   Blood pressure 93/64, pulse 79, temperature 98.5 F (36.9 C), resp. rate 17, height 5\' 7"  (1.702 m), weight 71.2 kg, SpO2 99 %.  Medical Problem List and Plan: 1.  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 Arnoldo Morale.             -patient may shower             -ELOS/Goals: minA in 10-14 days  --Continue CIR therapies including PT, OT, and SLP  2.  Antithrombotics: -DVT/anticoagulation: Lovenox initiated 12/02/2019- discontinued as ambulating >55feet             -antiplatelet therapy: N/A 3. Pain Management: Topamax 75 mg twice daily, hydrocodone/Robaxin as needed. -well controlled 10/5 4. Mood: Advised emotional support. In positive mood             -antipsychotic agents: N/A 5. Neuropsych: This patient is capable of making decisions on his own behalf.  - scheduled trazodone for insomnia  -currently on 150mg  trazodone at night  -check sleep chart given discrepancy in reports. Looked very alert today 6. Skin/Wound Care: Wound care of abdominal and cranial sites  -both areas remain clean and dry with staples 7. Fluids/Electrolytes/Nutrition: encourage appropriate PO  -I personally reviewed the patient's labs today.    -mild hyponatremia---> improved to 134 10/4  -hypokalemia--> replace k+ 8.  Seizure prophylaxis.  Keppra 500 mg twice daily 9.  Urinary retention.  Urecholine 10 mg 3 times daily.    -pt is continent and emptying bladder  -off urecholine 10.  Constipation.  MiraLAX daily. Senna-s.    -moving bowels 11. Leukocytosis  -down further to 9.5 today 10/4   LOS: 7 days A FACE TO [place] 12/11/2019, 12:48 PM

## 2019-12-11 NOTE — Progress Notes (Signed)
Physical Therapy Session Note  Patient Details  Name: Jason Skinner MRN: [id number] Date of Birth: 1990/07/15  Today's Date: 12/11/2019 PT Individual Time: 3810-1751 and 0258-5277 PT Individual Time Calculation (min): 58 min and 26 min  Short Term Goals: Week 1:  PT Short Term Goal 1 (Week 1): STG = LTG due to short ELOS. Week 2:  PT Short Term Goal 1 (Week 2): STG = LTG due to short ELOS.  Skilled Therapeutic Interventions/Progress Updates:  Treatment 1: Pt received in bed & agreeable to tx. Interpreter present for session. PT educates pt on need to demonstrate safety with mobility. Supine<>sit with supervision, HOB slightly elevated, bed rails & extra time. Sit<>stand with CGA & gait room<>Dayroom without AD & CGA<>min assist. Gait while tossing ball to focus on high level balance & dual task with CGA. Cybex Kinetron in standing with BUE support with mirror for visual feedback for midline orientation in addition to PT providing cuing to correct R lateral lean. Progressed to kinetron in standing without BUE support with min assist with focus on weight shifting, midline orientation, NMR, and strengthening. When attempting task without BUE support pt also presents with posterior lean & absent righting reactions despite PT education. Gait training in lite gait on treadmill (harness positioned well below incision so as not to interfere). Gait on treadmill with focus on weight shifting & improving gait pattern. Pt frequently attempts to hold to rails/straps with cuing not to. Pt demonstrates forward lean, decreased weight shifting L, decreased foot clearance BLE with minimal<>absent heel strike, and progressing to shuffled gait. When pt ambulates over ground pt demonstrates improved step length & foot clearance. Contacted pt's brother via telephone & via interpreter, requesting he bring in pt more clothing & tennis shoes. Pt left in bed with alarm set, call bell & all needs in reach.  Pain: denies  pain  Treatment 2: Pt received in bed & agreeable to tx. Interpreter present for session. Supine<>sit with hospital bed features & extra time to initiate & cuing to place BLE completely onto bed. Pt ambulates room<>ortho gym without AD & min assist with PT providing tactile/verbal cuing to decrease gait speed but pt does not respond to these. PT attempts to provide increased weight shifting L in stance phase. Pt continues to ambulate without direction, will pass room & unaware of this when he returns at end of session (did this this morning also). In ortho gym pt utilized dynavision while standing with board on L with focus on weight shifting to LLE while reaching to push buttons (3 minutes + 3 minutes). Pt then performs LLE single leg stance with focus on weight shifting LLE for balance retraining & NMR; pt encouraged to increase time standing on LLE. At end of session pt left in bed with alarm set, call bell & all needs in reach.  Pain: denies pain  Therapy Documentation Precautions:  Precautions Precautions: Fall Restrictions Weight Bearing Restrictions: No    Therapy/Group: Individual Therapy  Waunita Schooner 12/11/2019, 3:08 PM

## 2019-12-11 NOTE — Progress Notes (Signed)
Physical Therapy Weekly Progress Note  Patient Details  Name: Jason Skinner MRN: [id number] Date of Birth: 04/09/1990  Beginning of progress report period: December 05, 2019 End of progress report period: December 11, 2019  Today's Date: 12/11/2019   Pt is making slower than anticipated progress towards LTGs. Pt currently requires CGA<>min assist overall for safety with mobility without AD. Pt demonstrates impaired midline orientation (R lateral lean in sitting/standing). Pt appears to be functionally limited by cognitive impairments as pt demonstrates decreased awareness & attention that greatly impacts safety. Pt would benefit from continued skilled PT treatment to address the deficits noted above/below & to increase independence prior to d/c. It's recommended that caregiver participate in at least 1 session of training prior to d/c to ensure safety for pt & caregiver at home.   Pt has also been limited by fatigue 2/2 not resting at night - MD aware.  Patient continues to demonstrate the following deficits muscle weakness, decreased cardiorespiratoy endurance, decreased coordination, decreased attention, decreased awareness, decreased problem solving, decreased safety awareness, decreased memory and delayed processing, and decreased standing balance, decreased postural control and decreased balance strategies and therefore will continue to benefit from skilled PT intervention to increase functional independence with mobility.  Patient is slowly progressing toward long term goals..  Continue plan of care.  PT Short Term Goals Week 1:  PT Short Term Goal 1 (Week 1): STG = LTG due to short ELOS. Week 2:  PT Short Term Goal 1 (Week 2): STG = LTG due to short ELOS.   Therapy Documentation Precautions:  Precautions Precautions: Fall Restrictions Weight Bearing Restrictions: No   Therapy/Group: Individual Therapy  Waunita Schooner 12/11/2019, 9:26 AM

## 2019-12-11 NOTE — Progress Notes (Signed)
Occupational Therapy Session Note  Patient Details  Name: Jason Skinner MRN: [id number] Date of Birth: 05-Dec-1990  Today's Date: 12/11/2019 OT Individual Time: 0930-1030 OT Individual Time Calculation (min): 60 min    Short Term Goals: Week 1:  OT Short Term Goal 1 (Week 1): Pt will demonstrate improved endurance to be able to complete all B/D within 15 minutes with no rest breaks. OT Short Term Goal 2 (Week 1): Pt will be able to ambulate to toilet with CGA with LRAD. OT Short Term Goal 3 (Week 1): Pt will complete toileting with S.  Skilled Therapeutic Interventions/Progress Updates:      Pt seen for BADL retraining of toileting, bathing, and dressing with a focus on balance, awareness, coordination. Pt initially did not want to shower, but then agreed.  Pt able to sit to EOB, stand up and walk to bathroom with close S.  He sat on toilet to undress. Covered staples on abdomen with tegaderm.  Pt transferred into shower and avoided getting his head wet due to staples. Pt completed all bathing with S using bar for support in standing as needed.  R inattention when standing to avoid hitting his head on hh shower head.  Pt completed dressing sitting on seat in bathroom with close S and then ambulated to sink with close S to stand for several minutes to brush his teeth. No LOB observed today.    He then sat in arm chair to work on UE strengthening using 3# dowel bar.  Demonstrated and return demonstration of 15 reps each of chest press, forward and back rows, torso twists, bicep curls and overhead tricep extension. Asked pt to complete 2nd set and he did so recalling 80% of the exercises without cues.  Standing at wall, full sh AROM with wall slides 15x 2 sets of each arm.  Pt then opted to rest in bed. Transferred to bed with all needs met and alarm set.  Therapy Documentation Precautions:  Precautions Precautions: Fall Restrictions Weight Bearing Restrictions: No   Pain: Pain  Assessment Pain Scale: 0-10 Pain Score: 0-No pain ADL: ADL Eating: Set up Grooming: Supervision/safety Where Assessed-Grooming: Standing at sink Upper Body Bathing: Supervision/safety Where Assessed-Upper Body Bathing: Shower Lower Body Bathing: Supervision/safety Where Assessed-Lower Body Bathing: Shower Upper Body Dressing: Supervision/safety Where Assessed-Upper Body Dressing: Edge of bed Lower Body Dressing: Supervision/safety Where Assessed-Lower Body Dressing: Edge of bed Toileting: Supervision/safety Where Assessed-Toileting: Glass blower/designer: Close supervision Armed forces technical officer Method: Counselling psychologist: Ambulance person Transfer: Unable to English as a second language teacher: Close supervision Social research officer, government Method: [employer]: Radio broadcast assistant, Grab bars   Therapy/Group: Individual Therapy  [place] 12/11/2019, 10:42 AM

## 2019-12-11 NOTE — Patient Care Conference (Signed)
Inpatient RehabilitationTeam Conference and Plan of Care Update Date: 12/11/2019   Time: 10:44 AM    Patient Name: Jason Skinner      Medical Record Number: [id number]  Date of Birth: 09-17-1990 Sex: Male         Room/Bed: 0X38H/8E99B-71 Payor Info: Payor: /    Admit Date/Time:  12/04/2019  5:42 PM  Primary Diagnosis:  TBI (traumatic brain injury) [facility])  Hospital Problems: Principal Problem:   TBI (traumatic brain injury) [facility])    Expected Discharge Date: Expected Discharge Date: 12/17/19  Team Members Present: Physician leading conference: Dr. Alger Simons Care Coodinator Present: Loralee Pacas, LCSWA;Samiah Ricklefs Creig Hines, RN, BSN, [place] Nurse Present: Serena Croissant, LPN PT Present: Lavone Nian, PT OT Present: Meriel Pica, OT SLP Present: Weston Anna, SLP PPS Coordinator present : Ileana Ladd, Burna Mortimer, SLP     Current Status/Progress Goal Weekly Team Focus  Bowel/Bladder   pt contof b and b lbm 12/10/19  remain cont of b and b  Assess q shift and prn   Swallow/Nutrition/ Hydration             ADL's   Supervision with UB self care (grooming, bathing, dressing); CGA with LB self care, sit to stand and transfers due to occasional R side lean, and instability in trunk; improved attention to tasks with self care.  Has expressed to interpretor that he has been feeling very down.  could benefit from Neuropsych visit.  supervision overall  ADL training, balance, postural control, strength, attention, cognition, pt/fam education   Mobility   CGA<>min asist gait without AD, min assist stairs with B rails, poor attention/awareness/cognition  supervision overall with LRAD  balance, cognition, gait, stairs, NMR, awareness   Communication             Safety/Cognition/ Behavioral Observations  Min A  Supervision  problem solving, awareness   Pain   pt has no current c/o pain  remain pain free  assessq shift an dprn   Skin   pt has two surgical incisions  closed with staples  remain free of infectyion and breakdown  assess q shift andprn     Discharge Planning:  D/c to home with his brother [name] and a friend; both who will help with his care needs. Pt remains uninsured as WC is pending if case will be accepted at this time.   Team Discussion: No c/o pain, Incisions CDI, Continent B/B. OT making excellent progress, right inattention. PT cognition impairs balance, has supervision goals. SLP working on problem solving, very flat. MD reports patient is sleeping better. Patient on target to meet rehab goals: yes  *See Care Plan and progress notes for long and short-term goals.   Revisions to Treatment Plan:  Not at this time.  Teaching Needs: Continue family education.  Current Barriers to Discharge: Balance and endurance and wound care.  Possible Resolutions to Barriers: Teach wound care and dressing changes, work on balance and patient endurance.     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 pain control improving. still struggling with sleep at times  Barriers to Discharge: Medical stability   Possible Resolutions to Barriers/Weekly Focus: daily wound mgt, sleep-wake cycle assessment, nutritional assessment, lab/vs review   Continued Need for Acute Rehabilitation Level of Care: The patient requires daily medical management by a physician with specialized training in physical medicine and rehabilitation for the following reasons: Direction of a multidisciplinary physical rehabilitation program to maximize functional independence : Yes Medical  management of patient stability for increased activity during participation in an intensive rehabilitation regime.: Yes Analysis of laboratory values and/or radiology reports with any subsequent need for medication adjustment and/or medical intervention. : Yes   I attest that I was present, lead the team conference, and concur with the  assessment and plan of the team.   Cristi Loron 12/11/2019, 3:11 PM

## 2019-12-11 NOTE — Progress Notes (Signed)
Patient ID: Jason Skinner, male   DOB: 1990/07/23, 29 y.o.   MRN: [id number]  SW returned phone call to Terry/RN CM with [phone]) to provide updates from team conference, and d/c date 10/11. SW asked for further updates from [facility] on whether or not they will accept pt as we have to plan as if pt does not have insurance. Terry to visit pt today. SW waiting on follow-up. Clinicals left for [place] at front desk.   Pt d/c date 10/11. Recommendations for outpatient PT/OT/SLP. SW faxed referral to [facility] (p:[phone]/[phone]). SW will need to follow-up with family to provide updates from team conference.  Loralee Pacas, MSW, [place] Office: [phone] Cell: [phone] Fax: [phone]

## 2019-12-11 NOTE — Progress Notes (Signed)
Speech Language Pathology Weekly Progress and Session Note  Patient Details  Name: Jason Skinner MRN: [id number] Date of Birth: 04-10-1990  Beginning of progress report period: December 04, 2019 End of progress report period: December 11, 2019  Today's Date: 12/11/2019 SLP Individual Time: 4782-9562 SLP Individual Time Calculation (min): 45 min  Short Term Goals: Week 1: SLP Short Term Goal 1 (Week 1): Patient will demonstrate functional problem solving for basic and mildly complex tasks with Min A verbal cues. SLP Short Term Goal 1 - Progress (Week 1): Met SLP Short Term Goal 2 (Week 1): Patient will attend/scan to right field of enviornment during functional tasks with Min A verbal and visual cues. SLP Short Term Goal 2 - Progress (Week 1): Met SLP Short Term Goal 3 (Week 1): Patient will self-monitor and correct errors during functional tasks with Min A verbal cues. SLP Short Term Goal 3 - Progress (Week 1): Met    New Short Term Goals: Week 2: SLP Short Term Goal 1 (Week 2): STGs=LTGs due to ELOS  Weekly Progress Updates: Patient has made functional gains and has met 3 of 3 STGs this reporting period. Currently, patient continues to demonstrate a flat affect with minimal engagement that appears impacted by fatigue.  Patient requires overall Min A verbal and visual cues to complete functional and familiar tasks safely in regards to problem solving, awareness and visual scanning. Patient and family education is ongoing. Patient would benefit from continued skilled SLP intervention to maximize his cognitive functioning and overall functional independence prior to discharge.    Intensity: Minumum of 1-2 x/day, 30 to 90 minutes Frequency: 3 to 5 out of 7 days Duration/Length of Stay: 1 week Treatment/Interventions: Cognitive remediation/compensation;Internal/external aids;Cueing hierarchy;Environmental controls;Therapeutic Activities;Functional tasks;Patient/family  education   Daily Session  Skilled Therapeutic Interventions: Skilled treatment session focused on cognitive goals. SLP facilitated session by providing overall supervision level verbal cues for complex problem solving during a novel task. However, Max A verbal cues were required for initiation in regards to verbal responses and Min A verbal cues for initiation of functional tasks. Patient continues to verbalize that he is tired, suspect fatigue is impacting his overall function. Patient left upright in bed with alarm on and all needs within reach. Continue with current plan of care.      Pain No/Denies Pain   Therapy/Group: Individual Therapy  [name] 12/11/2019, 6:31 AM

## 2019-12-12 ENCOUNTER — Ambulatory Visit: Payer: Self-pay | Admitting: Nurse Practitioner

## 2019-12-12 ENCOUNTER — Inpatient Hospital Stay (HOSPITAL_COMMUNITY): Payer: Self-pay | Admitting: Speech Pathology

## 2019-12-12 ENCOUNTER — Inpatient Hospital Stay (HOSPITAL_COMMUNITY): Payer: Self-pay

## 2019-12-12 ENCOUNTER — Inpatient Hospital Stay (HOSPITAL_COMMUNITY): Payer: Self-pay | Admitting: Occupational Therapy

## 2019-12-12 MED ORDER — QUETIAPINE FUMARATE 25 MG PO TABS
25.0000 mg | ORAL_TABLET | Freq: Every day | ORAL | Status: DC
  Administered 2019-12-12: 25 mg via ORAL
  Filled 2019-12-12: qty 1

## 2019-12-12 NOTE — Progress Notes (Signed)
Physical Therapy Session Note  Patient Details  Name: Jason Skinner MRN: [id number] Date of Birth: 1990/11/01  Today's Date: 12/12/2019 PT Individual Time: 1300-1415 PT Individual Time Calculation (min): 75 min   Short Term Goals: Week 1:  PT Short Term Goal 1 (Week 1): STG = LTG due to short ELOS. Week 2:  PT Short Term Goal 1 (Week 2): STG = LTG due to short ELOS. Week 3:     Skilled Therapeutic Interventions/Progress Updates:    PAIN Pt c/o unspecified bilat UE pain, does not quantify.  Rest breaks provided.  Interpreter present for full session.  Pt agreeable to session w focus on core stability, dynamic balance, functional gait. Supine to sit mod I.  Dons shoes w/set up only.  STS w/supervision.  Gait >133ft including sudden stops and starts w/cga to occasional min assist. Backing 89ft x 1, progressive loss of contol posteriorly w/stopping to regain balance every 65ft, w/min assistace   Standing push/pull w/therapist resistance using dowel at 90* to promote core stability  In staning pt attempts to push/pull therapist in all directions w/hands on therapists shoulders/ promoting core activation.  Pt instructed w/standing reverse lunge, but proceeds w/ Forward/reverse alternating lunges w/min assist required for balance  Pt requseted via pointing to practice stairs.  Gait 29ft to stairs w/cga, ascends/descends 20steps w/2 rails w/cga to close supervision  Functional gait: Pt retrieved weighted bucket (7lb) and carried x 144ft w/cga, noted improved stability overall w/gait due to increased core activation when carrying weight.   Step ups R w/single knee raise L for balance/stability challenge using 8in curb x 12 reps, seated rest then repeats w/RLE Added lifting 1lb ball along w/knee raise/step up for increased core/balance challenge, min assist for balance, performed bilat w/rest between efforts.    Pathfinding: Pt instructed to attend to directions as he would be  asked to navigate return trip.  Pt then performed gait including 2 turns and use of elevator to ground level all >546ft w/cga.  Upon return x 549ft, pt required cues for decisionmaking at 2 of 3 turns - when given 2 clear choices he chose correct direction, did recall 4th floor but requires cues to initiate calling elevator and for choosing correct direction.  Pt unable to recall starting point, but able to make correct choice given two options.   Pathfinging gym to room - pt able to recall room number.  Able to identify correct hall but passes room, notes room numbers in 54s and correctly backtracks, locates room w/some hesitation.   Turn/sit to bed w/cga.  Removes shoes w/supervision.  Sit to sidelying mod I. Pt left sidelying w/rails up x 4, alarm set, bed in lowest position, and needs in reach.   Therapy Documentation Precautions:  Precautions Precautions: Fall Restrictions Weight Bearing Restrictions: No    Therapy/Group: Individual Therapy  Callie Fielding, PT   Jerrilyn Cairo 12/12/2019, 2:24 PM

## 2019-12-12 NOTE — Progress Notes (Signed)
Occupational Therapy Session Note  Patient Details  Name: Jason Skinner MRN: [id number] Date of Birth: [birth year]  Today's Date: 12/12/2019 OT Individual Time: 0086-7619 OT Individual Time Calculation (min): 55 min    Short Term Goals: Week 1:  OT Short Term Goal 1 (Week 1): Pt will demonstrate improved endurance to be able to complete all B/D within 15 minutes with no rest breaks. OT Short Term Goal 2 (Week 1): Pt will be able to ambulate to toilet with CGA with LRAD. OT Short Term Goal 3 (Week 1): Pt will complete toileting with S.  Skilled Therapeutic Interventions/Progress Updates:      Pt seen for BADL retraining of showering and dressing with a focus on R side awareness, R coordination, balance, attention.  Pt received in bed with interpretor present.   Explained to pt that I want him to take his time with movement and really attend to things on his R side. Pt agreed.  Overall he did extremely well today with getting out of bed, walking to bathroom, toileting, undressing, getting in and out of shower, bathing, dressing, and standing at sink all with S. At the sink after he brushed his teeth, it appeared he lost his balance posteriorly but pt stated he was was just stepping backwards.  Gave pt verbal instructions of where to go next ("take a left out of your room, then another L at the nursing station then sit on a mat in the gym").  Pt recalled and followed instructions without cues and ambulated with CGA.  In gym worked on R side coordination and standing balance with [employer]. Pt had to stay standing for all 3 throws, sit to stand each set, and keep track of all of his points. He did well with this needing close S when standing.  Asked pt his room number, which he stated correctly and then he walked back without cues.  It was interesting that instead of making a sharp R turn, he veered out to the L (demonstrating R inattention).   Pt opted to rest in bed. Doffed shoes  and moved into supine. Bed alarm set and all needs met.   Therapy Documentation Precautions:  Precautions Precautions: Fall Restrictions Weight Bearing Restrictions: No Pain: Pain Assessment Pain Score: 0-No pain ADL: ADL Eating: Set up Grooming: Supervision/safety Where Assessed-Grooming: Standing at sink Upper Body Bathing: Supervision/safety Where Assessed-Upper Body Bathing: Shower Lower Body Bathing: Supervision/safety Where Assessed-Lower Body Bathing: Shower Upper Body Dressing: Supervision/safety Where Assessed-Upper Body Dressing: Edge of bed Lower Body Dressing: Supervision/safety Where Assessed-Lower Body Dressing: Edge of bed Toileting: Supervision/safety Where Assessed-Toileting: Glass blower/designer: Close supervision Armed forces technical officer Method: Counselling psychologist: Ambulance person Transfer: Unable to English as a second language teacher: Close supervision Social research officer, government Method: [employer]: Radio broadcast assistant, Grab bars  Therapy/Group: Individual Therapy  Lillie 12/12/2019, 11:46 AM

## 2019-12-12 NOTE — Progress Notes (Signed)
Patient ID: Jason Skinner, male   DOB: Sep 22, 1990, 29 y.o.   MRN: [id number]  Covering for primary sw, [place]  Sw attempted to call Coralyn Mark (CM) with [employer] to follow up on pending worker's comp case. Left voicemail will continue to follow up.   Joseph, [place]

## 2019-12-12 NOTE — Progress Notes (Signed)
PHYSICAL MEDICINE & REHABILITATION PROGRESS NOTE   Subjective/Complaints: Did not sleep well. Appears sleepy, slow this morning. Sleep chart indicates only intermittent resting. Denies significant pain  ROS: Patient denies fever, rash, sore throat, blurred vision, nausea, vomiting, diarrhea, cough, shortness of breath or chest pain,  or mood change.   Objective:   No results found. Recent Labs    12/10/19 0557  WBC 9.5  HGB 12.8*  HCT 37.7*  PLT 666*   Recent Labs    12/10/19 0557  NA 134*  K 3.0*  CL 101  CO2 21*  GLUCOSE 100*  BUN 6  CREATININE 0.84  CALCIUM 9.8    Intake/Output Summary (Last 24 hours) at 12/12/2019 0849 Last data filed at 12/11/2019 1716 Gross per 24 hour  Intake 246 ml  Output --  Net 246 ml        Physical Exam: Vital Signs Blood pressure 99/65, pulse 81, temperature 98.3 F (36.8 C), resp. rate 14, height 5\' 7"  (1.702 m), weight 71.2 kg, SpO2 99 %.   Constitutional: No distress . Vital signs reviewed. HEENT: EOMI, oral membranes moist Neck: supple Cardiovascular: RRR without murmur. No JVD    Respiratory/Chest: CTA Bilaterally without wheezes or rales. Normal effort    GI/Abdomen: BS +, non-tender, non-distended Ext: no clubbing, cyanosis, or edema Psych:  Flat, delayed Skin:crani incision CDI, abdominal incision CDI both with staples. decreased swelling around flap still present but decreasing Neuro: alert and follows commands. Communicates in [place], some broke english.  follows basic commands. Moves all 4's L>R.. fair insight and awareness Musculoskeletal: normal ROM     Assessment/Plan: 1. 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  Physiatrist is providing close team supervision and 24 hour management of active medical problems listed below.  Physiatrist and rehab team continue to assess barriers  to discharge/monitor patient progress toward functional and medical goals  Care Tool:  Bathing    Body parts bathed by patient: Right arm, Left arm, Chest, Abdomen, Front perineal area, Buttocks, Right upper leg, Left upper leg, Right lower leg, Left lower leg, Face         Bathing assist Assist Level: Supervision/Verbal cueing     Upper Body Dressing/Undressing Upper body dressing   What is the patient wearing?: Pull over shirt    Upper body assist Assist Level: Supervision/Verbal cueing    Lower Body Dressing/Undressing Lower body dressing      What is the patient wearing?: Underwear/pull up, Pants     Lower body assist Assist for lower body dressing: Supervision/Verbal cueing     Toileting Toileting    Toileting assist Assist for toileting: Supervision/Verbal cueing Assistive Device Comment: urinal   Transfers Chair/bed transfer  Transfers assist     Chair/bed transfer assist level: Contact Guard/Touching assist     Locomotion Ambulation   Ambulation assist      Assist level: Minimal Assistance - Patient > 75% Assistive device: No Device Max distance: 150 ft   Walk 10 feet activity   Assist     Assist level: Minimal Assistance - Patient > 75% Assistive device: Walker-rolling   Walk 50 feet activity   Assist    Assist level: Minimal Assistance - Patient > 75% Assistive device: Walker-rolling    Walk 150 feet activity   Assist    Assist level: Minimal Assistance - Patient > 75% Assistive device: Walker-rolling    Walk  10 feet on uneven surface  activity   Assist     Assist level: Minimal Assistance - Patient > 75% Assistive device:  (none)   Wheelchair     Assist Will patient use wheelchair at discharge?: No Type of Wheelchair: Manual    Wheelchair assist level: Minimal Assistance - Patient > 75% Max wheelchair distance: 150 ft    Wheelchair 50 feet with 2 turns activity    Assist        Assist  Level: Minimal Assistance - Patient > 75%   Wheelchair 150 feet activity     Assist      Assist Level: Minimal Assistance - Patient > 75%   Blood pressure 99/65, pulse 81, temperature 98.3 F (36.8 C), resp. rate 14, height 5\' 7"  (1.702 m), weight 71.2 kg, SpO2 99 %.  Medical Problem List and Plan: 1.  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 Arnoldo Morale.             -patient may shower             -ELOS/Goals: minA in 10-14 days  --Continue CIR therapies including PT, OT, and SLP  2.  Antithrombotics: -DVT/anticoagulation: ambulation             -antiplatelet therapy: N/A 3. Pain Management: Topamax 75 mg twice daily, hydrocodone/Robaxin as needed. -well controlled 10/6 4. Mood: Advised emotional support. In positive mood             -antipsychotic agents: N/A 5. Neuropsych: This patient is capable of making decisions on his own behalf.  -dc trazodone, trial of seroquel 25mg  qhs  -continue sleep chart   6. Skin/Wound Care: Wound care of abdominal and cranial sites  -both areas remain clean and dry with staples 7. Fluids/Electrolytes/Nutrition: encourage appropriate PO  -I personally reviewed the patient's labs today.    -mild hyponatremia---> improved to 134 10/4  -hypokalemia--> replace k+  -check labs Friday 8.  Seizure prophylaxis.  Keppra 500 mg twice daily 9.  Urinary retention.  Urecholine 10 mg 3 times daily.    -pt is continent and emptying bladder  -off urecholine 10.  Constipation.  MiraLAX daily. Senna-s.    -moving bowels 11. Leukocytosis  -down further to 9.5 today 10/4   LOS: 8 days A FACE TO FACE EVALUATION WAS PERFORMED  Meredith Staggers 12/12/2019, 8:49 AM

## 2019-12-12 NOTE — Progress Notes (Signed)
Speech Language Pathology Daily Session Note  Patient Details  Name: Jason Skinner MRN: [id number] Date of Birth: Dec 02, 1990  Today's Date: 12/12/2019 SLP Individual Time: 1100-1155 SLP Individual Time Calculation (min): 55 min  Short Term Goals: Week 2: SLP Short Term Goal 1 (Week 2): STGs=LTGs due to ELOS  Skilled Therapeutic Interventions: Skilled treatment session focused on cognitive goals. SLP facilitated session by providing supervision verbal cues to self-monitor and correct errors during a novel and complex PEG task with focus on problem solving and attention. Patient demonstrated sustained attention to task for ~10 minutes prior to needing a rest break due to fatigue but then was able to complete task without cues for redirection. Patient also identified possible safety hazards and generated safe solutions to the potential problems with Mod I. Overall, patient appeared more alert and engaged this session. Patient left upright in bed with alarm on and all needs within reach. Continue with current plan of care.      Pain Pain Assessment Pain Score: 0-No pain  Therapy/Group: Individual Therapy  Jason Skinner 12/12/2019, 12:30 PM

## 2019-12-13 ENCOUNTER — Inpatient Hospital Stay (HOSPITAL_COMMUNITY): Payer: Self-pay | Admitting: Speech Pathology

## 2019-12-13 ENCOUNTER — Inpatient Hospital Stay (HOSPITAL_COMMUNITY): Payer: Self-pay | Admitting: Physical Therapy

## 2019-12-13 ENCOUNTER — Inpatient Hospital Stay (HOSPITAL_COMMUNITY): Payer: Self-pay | Admitting: Occupational Therapy

## 2019-12-13 MED ORDER — QUETIAPINE FUMARATE 50 MG PO TABS
50.0000 mg | ORAL_TABLET | Freq: Every day | ORAL | Status: DC
  Administered 2019-12-13 – 2019-12-16 (×4): 50 mg via ORAL
  Filled 2019-12-13 (×4): qty 1

## 2019-12-13 NOTE — Progress Notes (Signed)
Staples removed from pt head and abdomen per order. No drainage noted. Area to scalp, CDI. Area to abdomen, CDI. Pt tolerated well.

## 2019-12-13 NOTE — Progress Notes (Signed)
Physical Therapy Session Note  Patient Details  Name: Jason Skinner MRN: [id number] Date of Birth: 1990/11/23  Today's Date: 12/13/2019 PT Individual Time: 0223-3612 PT Individual Time Calculation (min): 70 min   Short Term Goals: Week 2:  PT Short Term Goal 1 (Week 2): STG = LTG due to short ELOS.  Skilled Therapeutic Interventions/Progress Updates:   Pt received supine in bed and agreeable to PT. Supine>sit transfer without assist or cues. Pt donned shoes sitting EOB without assist, and increased time to tie laces.   Gait training in hall with supervision assist and min cues to prevent hitting doorways on the L side as well as path finding back to room with supervision assist and min-mod cues for problem solving strategy to use numbers on door to locate room after missing it 2 times.   Dynamic balance training while engaged in fine motor task of wii bowling, pt able to stand x 6 frames and then 3 additional frames. Pt also performed Wii fit table tilt and penguin slide x 2. wii resort canoe race x 2, sword play battle. Supervision assist-min assist throughout from PT for safety with cues for posture and anterior weight shift to prevent posterior LOB and tactile cues to improve trunkal rotation as needed. PT provided multimodal instructions as well for use of ankle strategy for COM control on wii fit.   Pt returned to room and performed ambulatory transfer to bed with supervision assist. Pt doffed shoes without assist at EOB. Sit>supine completed without assist, and pt left supine in bed with call bell in reach and all needs met.           Therapy Documentation Precautions:  Precautions Precautions: Fall Restrictions Weight Bearing Restrictions: No    Pain: denies   Therapy/Group: Individual Therapy  [name] 12/13/2019, 3:54 PM

## 2019-12-13 NOTE — Progress Notes (Signed)
Speech Language Pathology Discharge Summary  Patient Details  Name: Jason Skinner MRN: [id number] Date of Birth: 14-Dec-1990  Today's Date: 12/16/2019 SLP Individual Time: 1050-1130 SLP Individual Time Calculation (min): 40 min   Skilled Therapeutic Interventions:  Pt was seen for skilled ST targeting cognitive goals and education with pt. Although family education was scheduled and attempted, no family was present for today's session. Spanish interpretor was present to facilitated session in pt's preferred language. SLP provided review of compensatory memory strategies with pt, with emphasis on aids like calendars, use of routines, no multitasking. Also reviewed activities pt may engage in at home to safety and effectively encourage continues cognitive rehabilitation and independence. SLP re-administered SLUMS evaluation as post-test for this admission. Pt scored 25/30 (WNL for high school education = 27 or above). Pt's score improved by 3 points since admission. Although mild cognitive deficits still noted in complex problem solving and emergent awareness, pt has made steady and functional progress this admission. Pt left laying in bed with alarm set and needs within reach. Continue per current plan of care.       Patient has met 2 of 2 long term goals.  Patient to discharge at overall Supervision level.   Reasons goals not met: N/A   Clinical Impression/Discharge Summary: Patient has made functional gains and has met 2 of 2 LTGs this reporting period. Currently, patient requires overall supervision level verbal cues and extra time for functional problem solving and emergent awareness in order to complete functional and familiar tasks safely. Patient's overall recall and attention appear WFL during basic and familiar tasks but rest breaks are needed at times due to ongoing lethargy. Patient and family education is complete and patient will discharge home with 24 hour supervision from  family. Patient would benefit from f/u SLP services to maximize his cognitive functioning and overall functional independence in order to reduce caregiver burden.   Care Partner:  Caregiver Able to Provide Assistance: Yes  Type of Caregiver Assistance: Physical;Cognitive  Recommendation:  Home Health SLP;24 hour supervision/assistance;Outpatient SLP  Rationale for SLP Follow Up: Reduce caregiver burden;Maximize cognitive function and independence   Equipment: N/A   Reasons for discharge: Discharged from hospital;Treatment goals met   Patient/Family Agrees with Progress Made and Goals Achieved: Yes    Arbutus Leas 12/16/2019, 12:07 PM

## 2019-12-13 NOTE — Progress Notes (Signed)
[facility] PHYSICAL MEDICINE & REHABILITATION PROGRESS NOTE   Subjective/Complaints: Slept off and on. Asked pt if he is feeling restless and he said yes. Denies pain  ROS: Patient denies fever, rash, sore throat, blurred vision, nausea, vomiting, diarrhea, cough, shortness of breath or chest pain, joint or back pain  or mood change.   Objective:   No results found. No results for input(s): WBC, HGB, HCT, PLT in the last 72 hours. No results for input(s): NA, K, CL, CO2, GLUCOSE, BUN, CREATININE, CALCIUM in the last 72 hours.  Intake/Output Summary (Last 24 hours) at 12/13/2019 1124 Last data filed at 12/13/2019 0700 Gross per 24 hour  Intake 338 ml  Output --  Net 338 ml        Physical Exam: Vital Signs Blood pressure 107/65, pulse 81, temperature 98 F (36.7 C), resp. rate 16, height 5\' 7"  (1.702 m), weight 71.2 kg, SpO2 100 %.   Constitutional: No distress . Vital signs reviewed. HEENT: EOMI, oral membranes moist Neck: supple Cardiovascular: RRR without murmur. No JVD    Respiratory/Chest: CTA Bilaterally without wheezes or rales. Normal effort    GI/Abdomen: BS +, non-tender, non-distended Ext: no clubbing, cyanosis, or edema Psych: flat but cooperative.  Skin:crani incision CDI, abdominal incision CDI both with staples. minimal swelling around flap still present but decreasing Neuro: alert and follows commands. Communicates in [place], some broke english.  follows basic commands. Moves all 4's L>R.. fair insight and awareness Musculoskeletal: normal ROM     Assessment/Plan: 1. 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  Physiatrist is providing close team supervision and 24 hour management of active medical problems listed below.  Physiatrist and rehab team continue to assess barriers to discharge/monitor patient progress toward functional and  medical goals  Care Tool:  Bathing    Body parts bathed by patient: Right arm, Left arm, Chest, Abdomen, Front perineal area, Buttocks, Right upper leg, Left upper leg, Right lower leg, Left lower leg, Face         Bathing assist Assist Level: Supervision/Verbal cueing     Upper Body Dressing/Undressing Upper body dressing   What is the patient wearing?: Pull over shirt    Upper body assist Assist Level: Supervision/Verbal cueing    Lower Body Dressing/Undressing Lower body dressing      What is the patient wearing?: Underwear/pull up, Pants     Lower body assist Assist for lower body dressing: Supervision/Verbal cueing     Toileting Toileting    Toileting assist Assist for toileting: Supervision/Verbal cueing Assistive Device Comment: urinal   Transfers Chair/bed transfer  Transfers assist     Chair/bed transfer assist level: Supervision/Verbal cueing     Locomotion Ambulation   Ambulation assist      Assist level: Minimal Assistance - Patient > 75% Assistive device: No Device Max distance: 150 ft   Walk 10 feet activity   Assist     Assist level: Minimal Assistance - Patient > 75% Assistive device: Walker-rolling   Walk 50 feet activity   Assist    Assist level: Minimal Assistance - Patient > 75% Assistive device: Walker-rolling    Walk 150 feet activity   Assist    Assist level: Minimal Assistance - Patient > 75% Assistive device: Walker-rolling    Walk 10 feet on uneven surface  activity   Assist     Assist level: Minimal Assistance - Patient >  75% Assistive device:  (none)   Wheelchair     Assist Will patient use wheelchair at discharge?: No Type of Wheelchair: Manual    Wheelchair assist level: Minimal Assistance - Patient > 75% Max wheelchair distance: 150 ft    Wheelchair 50 feet with 2 turns activity    Assist        Assist Level: Minimal Assistance - Patient > 75%   Wheelchair 150 feet  activity     Assist      Assist Level: Minimal Assistance - Patient > 75%   Blood pressure 107/65, pulse 81, temperature 98 F (36.7 C), resp. rate 16, height 5\' 7"  (1.702 m), weight 71.2 kg, SpO2 100 %.  Medical Problem List and Plan: 1.  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 Arnoldo Morale.             -patient may shower             -ELOS/Goals: minA in 10-14 days  --Continue CIR therapies including PT, OT, and SLP  2.  Antithrombotics: -DVT/anticoagulation: ambulation             -antiplatelet therapy: N/A 3. Pain Management: Topamax 75 mg twice daily, hydrocodone/Robaxin as needed. -well controlled 10/7 4. Mood: Advised emotional support. In positive mood             -antipsychotic agents: seroquel 5. Neuropsych: This patient is capable of making decisions on his own behalf.  -10/7 increase seroquel to 50mg  tonight  -continue sleep chart   6. Skin/Wound Care: Wound care of abdominal and cranial sites  -both areas remain clean and dry with staples 7. Fluids/Electrolytes/Nutrition: encourage appropriate PO  -I personally reviewed the patient's labs today.    -mild hyponatremia---> improved to 134 10/4  -hypokalemia--> replace k+  -check labs Friday 8.  Seizure prophylaxis.  Keppra 500 mg twice daily 9.  Urinary retention.  Urecholine 10 mg 3 times daily.    -pt is continent and emptying bladder  -off urecholine 10.  Constipation.  MiraLAX daily. Senna-s.    -moving bowels 11. Leukocytosis  -down further to 9.5 today 10/4   LOS: 9 days A FACE TO FACE EVALUATION WAS PERFORMED  Meredith Staggers 12/13/2019, 11:24 AM

## 2019-12-13 NOTE — Progress Notes (Signed)
Patient ID: Jason Skinner, male   DOB: 09-11-1990, 29 y.o.   MRN: [id number]  Covering for primary sw, Auria  Received follow-up call from Alric Quan. At this time she is unsure if patient worker comp with be approved. As of now still pending. Will continue to follow up.    [name], [place]

## 2019-12-13 NOTE — Progress Notes (Signed)
Speech Language Pathology Daily Session Note  Patient Details  Name: Jason Skinner MRN: [id number] Date of Birth: 06-04-1990  Today's Date: 12/13/2019 SLP Individual Time: 1105-1200 SLP Individual Time Calculation (min): 55 min  Short Term Goals: Week 2: SLP Short Term Goal 1 (Week 2): STGs=LTGs due to ELOS  Skilled Therapeutic Interventions: Skilled treatment session focused on cognitive goals. SLP facilitated session by [place]. Patient scored 26/30 points which is considered normal for patient's level of education.  Patient reported he still would like to work on Comptroller tasks. Therefore, SLP provided Min A visual cues for patient to self-monitor and correct errors while counting specific amounts of bills and coins. Patient transferred back to bed at end of session and left with RN preset to remove staples. Continue with current plan of care.      Pain No/Denies Pain   Therapy/Group: Individual Therapy  Jason Skinner 12/13/2019, 12:40 PM

## 2019-12-13 NOTE — Progress Notes (Signed)
Occupational Therapy Session Note  Patient Details  Name: Jason Skinner MRN: [id number] Date of Birth: 06-Jun-1990  Today's Date: 12/13/2019 OT Individual Time: 8341-9622 OT Individual Time Calculation (min): 45 min    Short Term Goals: Week 1:  OT Short Term Goal 1 (Week 1): Pt will demonstrate improved endurance to be able to complete all B/D within 15 minutes with no rest breaks. OT Short Term Goal 2 (Week 1): Pt will be able to ambulate to toilet with CGA with LRAD. OT Short Term Goal 3 (Week 1): Pt will complete toileting with S. Week 2:     Skilled Therapeutic Interventions/Progress Updates:    Interpreter present for the session. 1:1 Self care retraining at shower level. Pt with no clean clothes and opted to wear same clothes as yesterday. Pt able to navigate around room and bathroom without AD with close supervision. Pt performed sit to stands with supervision. Pt does required extra time to progress commands and provide a verbal and/ or motorical response. Pt did not require any cuing in the shower to complete bathing thoroughly. Pt does appear to continue to be sensitive to the temperature and prefer a warm environment. After encouragement pt participated in tooth brushing at the sink in standing.  Participated in Pill Box Test. Pt did not make any omission errors or commission errors. He did however require A to follow direction for pills that are multiple times a day and generalize that information to be applied to mulitple times of day.  Time to performed the test >15 min.  Fail.  Left pt resting int he bed at the end of the session.   Therapy Documentation Precautions:  Precautions Precautions: Fall Restrictions Weight Bearing Restrictions: No Pain: Pain Assessment Pain Scale: 0-10 Pain Score: 0-No pain   Therapy/Group: Individual Therapy  Willeen Cass [facility] 12/13/2019, 11:10 AM

## 2019-12-13 NOTE — Plan of Care (Signed)
  Problem: RH SAFETY Goal: RH STG ADHERE TO SAFETY PRECAUTIONS W/ASSISTANCE/DEVICE Description: STG Adhere to Safety Precautions With cues/reminders Assistance/Device. Outcome: Progressing   Problem: RH SKIN INTEGRITY Goal: RH STG SKIN FREE OF INFECTION/BREAKDOWN Description: With min assist  Outcome: Progressing

## 2019-12-14 ENCOUNTER — Inpatient Hospital Stay (HOSPITAL_COMMUNITY): Payer: Self-pay | Admitting: Speech Pathology

## 2019-12-14 ENCOUNTER — Inpatient Hospital Stay (HOSPITAL_COMMUNITY): Payer: Self-pay | Admitting: Physical Therapy

## 2019-12-14 ENCOUNTER — Encounter (HOSPITAL_COMMUNITY): Payer: Self-pay | Admitting: Psychology

## 2019-12-14 ENCOUNTER — Inpatient Hospital Stay (HOSPITAL_COMMUNITY): Payer: Self-pay | Admitting: Occupational Therapy

## 2019-12-14 LAB — CBC
HCT: 38.8 % — ABNORMAL LOW (ref 39.0–52.0)
Hemoglobin: 13.2 g/dL (ref 13.0–17.0)
MCH: 30.8 pg (ref 26.0–34.0)
MCHC: 34 g/dL (ref 30.0–36.0)
MCV: 90.7 fL (ref 80.0–100.0)
Platelets: 678 10*3/uL — ABNORMAL HIGH (ref 150–400)
RBC: 4.28 MIL/uL (ref 4.22–5.81)
RDW: 13.9 % (ref 11.5–15.5)
WBC: 8.7 10*3/uL (ref 4.0–10.5)
nRBC: 0 % (ref 0.0–0.2)

## 2019-12-14 LAB — BASIC METABOLIC PANEL
Anion gap: 10 (ref 5–15)
BUN: 7 mg/dL (ref 6–20)
CO2: 20 mmol/L — ABNORMAL LOW (ref 22–32)
Calcium: 10.2 mg/dL (ref 8.9–10.3)
Chloride: 110 mmol/L (ref 98–111)
Creatinine, Ser: 0.87 mg/dL (ref 0.61–1.24)
GFR calc non Af Amer: >60 mL/min (ref >60)
Glucose, Bld: 96 mg/dL (ref 70–99)
Potassium: 3.5 mmol/L (ref 3.5–5.1)
Sodium: 140 mmol/L (ref 135–145)

## 2019-12-14 MED ORDER — POTASSIUM CHLORIDE CRYS ER 20 MEQ PO TBCR
20.0000 meq | EXTENDED_RELEASE_TABLET | Freq: Every day | ORAL | Status: DC
  Administered 2019-12-15 – 2019-12-17 (×3): 20 meq via ORAL
  Filled 2019-12-14 (×3): qty 1

## 2019-12-14 NOTE — Progress Notes (Signed)
Patient ID: Jason Skinner, male   DOB: 06-01-1990, 29 y.o.   MRN: [id number]  Covering for primary SW, Auria.  Sw preparing for patient discharge. OP orders have been faxed to [facility] by Auria. Patient has no DME recommendations. No updates from [facility], case still pending.  [place], [place]

## 2019-12-14 NOTE — Progress Notes (Signed)
Occupational Therapy Weekly Progress Note  Patient Details  Name: Jason Skinner MRN: [id number] Date of Birth: Jan 20, 1991  Beginning of progress report period: December 04, 2019 End of progress report period: December 14, 2019  Today's Date: 12/14/2019 OT Individual Time: 0930-1030 OT Individual Time Calculation (min): 60 min    Patient has met 3 of 3 short term goals.  Pt has been making excellent progress this week. He has improved visual scanning and awareness of his R side, improved attention and sequencing and balance.  He occasionally will lose his balance if he steps backwards too quickly and needs cues for problem solving.    Patient continues to demonstrate the following deficits: muscle weakness, decreased cardiorespiratoy endurance, unbalanced muscle activation and decreased coordination, decreased attention to right, decreased attention, decreased problem solving and decreased safety awareness and decreased sitting balance, decreased standing balance, decreased postural control, hemiplegia and decreased balance strategies and therefore will continue to benefit from skilled OT intervention to enhance overall performance with BADL.  Patient progressing toward long term goals..  Continue plan of care.  OT Short Term Goals Week 1:  OT Short Term Goal 1 (Week 1): Pt will demonstrate improved endurance to be able to complete all B/D within 15 minutes with no rest breaks. OT Short Term Goal 1 - Progress (Week 1): Met OT Short Term Goal 2 (Week 1): Pt will be able to ambulate to toilet with CGA with LRAD. OT Short Term Goal 2 - Progress (Week 1): Met OT Short Term Goal 3 (Week 1): Pt will complete toileting with S. OT Short Term Goal 3 - Progress (Week 1): Met Week 2:  OT Short Term Goal 1 (Week 2): STGs = LTGs  Skilled Therapeutic Interventions/Progress Updates:      Pt seen for BADL retraining of toileting, bathing, and dressing with a focus on problem solving, balance,  endurance.  Overall he was S but did need a cue for problem solving on donning his socks.  He was sitting on the edge of the bed and tried to pull on tight sock on L foot by just reaching forward. Pt stated " I cant do it" as he was falling back onto the bed.  Needed cue to sit up straight and cross his ankle over opposite knee and then he completed with no difficulty.  He has consistently donned his socks in the past without Cues, but today he was needing that cue.  At the sink, he stepped back quickly and nearly lost his balance as he tends to stand with his feet too close together. Cued pt to stand with feet wider at a staggered stance.   Pt ambulated to day room following directions well.   Sat on mat to work on BUE coordination and strength with various exercises holding onto a soccer ball:  Overhead reaches, "kettlebell style swings", trunk rotation, cross chops, tossing and catching ball and bouncing ball on floor and catching.  Pt did well with exercises but needed cues to fully extend arms as he tends to flex his elbows and to move at a rapid rate as he tends to move slowly.   Also worked on visual tracking of ball from midline to R.  Pt ambulated back to his room with close S and no cues needed to find his room. Pt resting in bed with all needs met.   Therapy Documentation Precautions:  Precautions Precautions: Fall Restrictions Weight Bearing Restrictions: No      Pain: no c/o pain  ADL: ADL Eating: Set up Grooming: Supervision/safety Where Assessed-Grooming: Standing at sink Upper Body Bathing: Supervision/safety Where Assessed-Upper Body Bathing: Shower Lower Body Bathing: Supervision/safety Where Assessed-Lower Body Bathing: Shower Upper Body Dressing: Supervision/safety Where Assessed-Upper Body Dressing: Edge of bed Lower Body Dressing: Supervision/safety Where Assessed-Lower Body Dressing: Edge of bed Toileting: Supervision/safety Where Assessed-Toileting:  Glass blower/designer: Close supervision Armed forces technical officer Method: Counselling psychologist: Ambulance person Transfer: Unable to English as a second language teacher: Close supervision Social research officer, government Method: [employer]: Radio broadcast assistant, Grab bars   Therapy/Group: Individual Therapy  [place] 12/14/2019, 12:58 PM

## 2019-12-14 NOTE — Progress Notes (Signed)
Physical Therapy Session Note  Patient Details  Name: Jason Skinner MRN: [id number] Date of Birth: [birth year]  Today's Date: 12/14/2019 PT Individual Time: 1300-1355 PT Individual Time Calculation (min): 55 min   Short Term Goals: Week 2:  PT Short Term Goal 1 (Week 2): STG = LTG due to short ELOS.  Skilled Therapeutic Interventions/Progress Updates:  Pt received in bed & agreeable to tx. Interpreter present for session. Supine<>sit with supervision, bed rails, HOB slightly elevated & extra time. Pt dons shoes sitting EOB with supervision. Sit<>stand with supervision & gait room<>dayroom without AD & supervision with decreased BUE arm swing and weight shift L with pt requiring max cuing throughout for pathfinding to different rooms & to decrease speed, & increased attention for safety with mobility. Pt engages in walking while bouncing ball with min assist with focus on high level balance training & dual tasks. Pt completes Berg Balance Test with frequent, max cuing for sustained attention to tasks (I.e. stand for 2 minutes). Patient demonstrates increased fall risk as noted by score of 32/56 on Berg Balance Scale.  (<36= high risk for falls, close to 100%; 37-45 significant >80%; 46-51 moderate >50%; 52-55 lower >25%). Educated pt on Berg Balance Score & current fall risk. Pt engaged in [employer] Limits of Stability (accuracy 33% then 37%) & weight bearing percentage with pt intermittently equal weight bearing BLE but pt continues to demonstrate R lateral trunk lean. Focus of task on equal weight bearing BLE and weight shifting with ankle reactions. Pt completes tub transfer stepping over tub wall with BUE support on grab bar & min assist with PT educating pt on need for assistance for tub transfer at home. Pt requires max cuing to locate room at end of session & max cuing to reposition in bed. Pt left sitting up in bed with lunch tray, bed alarm set, call bell in reach.   Therapy  Documentation Precautions:  Precautions Precautions: Fall Restrictions Weight Bearing Restrictions: No  Pain: C/o unrated pain at abdominal incision but declines asking nursing staff for medication.  Balance: Balance Balance Assessed: Yes Standardized Balance Assessment Standardized Balance Assessment: Berg Balance Test Berg Balance Test Sit to Stand: Able to stand  independently using hands (stands without hands but does push back with BLE onto mat table) Standing Unsupported: Able to stand 2 minutes with supervision (requires cuing for sustained attention to task & for pt to continue to stand without assist) Sitting with Back Unsupported but Feet Supported on Floor or Stool: Able to sit safely and securely 2 minutes Stand to Sit: Sits safely with minimal use of hands Transfers: Able to transfer with verbal cueing and /or supervision Standing Unsupported with Eyes Closed: Able to stand 10 seconds with supervision Standing Ubsupported with Feet Together: Able to place feet together independently but unable to hold for 30 seconds (LOB to R) From Standing, Reach Forward with Outstretched Arm: Reaches forward but needs supervision From Standing Position, Pick up Object from Floor: Able to pick up shoe, needs supervision From Standing Position, Turn to Look Behind Over each Shoulder: Needs supervision when turning (minimal trunk rotation) Turn 360 Degrees: Needs close supervision or verbal cueing (cuing to complete 360 degree turn vs 180 degree) Standing Unsupported, Alternately Place Feet on Step/Stool: Needs assistance to keep from falling or unable to try (LOB to R with assist to correct) Standing Unsupported, One Foot in Front: Able to plae foot ahead of the other independently and hold 30 seconds Standing on One Leg: Able  to lift leg independently and hold equal to or more than 3 seconds Total Score: 32 Static Sitting Balance Static Sitting - Level of Assistance: 7:  Independent Dynamic Sitting Balance Dynamic Sitting - Level of Assistance: 5: Stand by assistance Static Standing Balance Static Standing - Level of Assistance: 5: Stand by assistance Dynamic Standing Balance Dynamic Standing - Level of Assistance: 5: Stand by assistance    Therapy/Group: Individual Therapy  Waunita Schooner 12/14/2019, 2:02 PM

## 2019-12-14 NOTE — Progress Notes (Addendum)
[place] PHYSICAL MEDICINE & REHABILITATION PROGRESS NOTE   Subjective/Complaints: Says he had a better night. Sleep chart shows fair to inconsistent sleep pattern  ROS: Patient denies fever, rash, sore throat, blurred vision, nausea, vomiting, diarrhea, cough, shortness of breath or chest pain, joint or back pain, headache, or mood change.    Objective:   No results found. Recent Labs    12/14/19 0602  WBC 8.7  HGB 13.2  HCT 38.8*  PLT 678*   Recent Labs    12/14/19 0602  NA 140  K 3.5  CL 110  CO2 20*  GLUCOSE 96  BUN 7  CREATININE 0.87  CALCIUM 10.2    Intake/Output Summary (Last 24 hours) at 12/14/2019 1258 Last data filed at 12/14/2019 0745 Gross per 24 hour  Intake 616 ml  Output --  Net 616 ml        Physical Exam: Vital Signs Blood pressure 108/72, pulse 90, temperature 98 F (36.7 C), resp. rate 16, height 5\' 7"  (1.702 m), weight 71.2 kg, SpO2 100 %.   Constitutional: No distress . Vital signs reviewed. HEENT: EOMI, oral membranes moist Neck: supple Cardiovascular: RRR without murmur. No JVD    Respiratory/Chest: CTA Bilaterally without wheezes or rales. Normal effort    GI/Abdomen: BS +, non-tender, non-distended Ext: no clubbing, cyanosis, or edema Psych: pleasant and cooperative.  Skin:crani incision CDI, abdominal incision CDI staples out. Swelling decreasing around flap.  Neuro: very alert and follows commands. Communicates in [place], limited broke english.  follows basic commands. Moves all 4's L>R.. fair insight and awareness Musculoskeletal: normal ROM     Assessment/Plan: 1. 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  Physiatrist is providing close team supervision and 24 hour management of active medical problems listed below.  Physiatrist and rehab team continue to assess barriers to discharge/monitor patient  progress toward functional and medical goals  Care Tool:  Bathing    Body parts bathed by patient: Right arm, Left arm, Chest, Abdomen, Front perineal area, Buttocks, Right upper leg, Left upper leg, Right lower leg, Left lower leg, Face         Bathing assist Assist Level: Supervision/Verbal cueing     Upper Body Dressing/Undressing Upper body dressing   What is the patient wearing?: Pull over shirt    Upper body assist Assist Level: Supervision/Verbal cueing    Lower Body Dressing/Undressing Lower body dressing      What is the patient wearing?: Underwear/pull up, Pants     Lower body assist Assist for lower body dressing: Supervision/Verbal cueing     Toileting Toileting    Toileting assist Assist for toileting: Supervision/Verbal cueing Assistive Device Comment: urinal   Transfers Chair/bed transfer  Transfers assist     Chair/bed transfer assist level: Supervision/Verbal cueing     Locomotion Ambulation   Ambulation assist      Assist level: Minimal Assistance - Patient > 75% Assistive device: No Device Max distance: 150 ft   Walk 10 feet activity   Assist     Assist level: Minimal Assistance - Patient > 75% Assistive device: Walker-rolling   Walk 50 feet activity   Assist    Assist level: Minimal Assistance - Patient > 75% Assistive device: Walker-rolling    Walk 150 feet activity   Assist    Assist level: Minimal Assistance - Patient > 75% Assistive device: Walker-rolling    Walk 10 feet  on uneven surface  activity   Assist     Assist level: Minimal Assistance - Patient > 75% Assistive device:  (none)   Wheelchair     Assist Will patient use wheelchair at discharge?: No Type of Wheelchair: Manual    Wheelchair assist level: Minimal Assistance - Patient > 75% Max wheelchair distance: 150 ft    Wheelchair 50 feet with 2 turns activity    Assist        Assist Level: Minimal Assistance - Patient >  75%   Wheelchair 150 feet activity     Assist      Assist Level: Minimal Assistance - Patient > 75%   Blood pressure 108/72, pulse 90, temperature 98 F (36.7 C), resp. rate 16, height 5\' 7"  (1.702 m), weight 71.2 kg, SpO2 100 %.  Medical Problem List and Plan: 1.  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 Arnoldo Morale.             -patient may shower             -ELOS/Goals: minA in 10/11  --Continue CIR therapies including PT, OT, and SLP  2.  Antithrombotics: -DVT/anticoagulation: ambulation             -antiplatelet therapy: N/A 3. Pain Management: Topamax 75 mg twice daily, hydrocodone/Robaxin as needed. -well controlled 10/8 4. Mood: Advised emotional support. In positive mood             -antipsychotic agents: seroquel 5. Neuropsych: This patient is capable of making decisions on his own behalf.  -insomnia: 10/7 increased seroquel to 50mg  tonight   -10/8 seems to have slept better-->continue same dose   -continue sleep chart   6. Skin/Wound Care: Wound care of abdominal and cranial sites  -both areas remain clean and dry with staples 7. Fluids/Electrolytes/Nutrition: encourage appropriate PO  -I personally reviewed the patient's labs today.    -mild hyponatremia---> improved to 134 10/4  -hypokalemia--> 3.5 10/8----> continue supplementation    8.  Seizure prophylaxis.  Keppra 500 mg twice daily 9.  Urinary retention.  Urecholine 10 mg 3 times daily.    -pt is continent and emptying bladder  -off urecholine 10.  Constipation.  MiraLAX daily. Senna-s.    -moving bowels 11. Leukocytosis  -down further to 9.5 today 10/4--->8.7 10/8   LOS: 10 days A FACE TO [place] 12/14/2019, 12:58 PM

## 2019-12-14 NOTE — Progress Notes (Addendum)
Physical Therapy Discharge Summary  Patient Details  Name: Jason Skinner MRN: [id number] Date of Birth: 09-08-1990  Today's Date: 12/14/2019 PT Individual Time: 3568-6168 PT Individual Time Calculation (min): 55 min    Patient has met 11 of 11 long term goals due to improved activity tolerance, improved balance, improved postural control and improved coordination.  Patient to discharge at an ambulatory level close supervision without AD.   Patient's care partner was unavailable to attent family education during patient's stay, however will be able to provide the necessary physical and cognitive assistance at discharge, as the patient required this level of assist PTA.  Reasons goals not met: n/a  Recommendation:  Patient will benefit from ongoing skilled PT services in outpatient setting to continue to advance safe functional mobility, address ongoing impairments in NMR, balance, midline orientation, safety awareness, gait, stair negotiation, cognitive remediation, and minimize fall risk.  Equipment: No equipment provided  Reasons for discharge: treatment goals met and discharge from hospital  Patient/family agrees with progress made and goals achieved: Yes  PT Discharge Precautions/Restrictions Precautions Precautions: Fall Precaution Comments: flat affect, poor insight  Restrictions Weight Bearing Restrictions: No  Vision/Perception  No changes in baseline vision. Per OT assessment: Vision - Assessment Ocular Range of Motion: Within Functional Limits Alignment/Gaze Preference: Gaze left (pt can gaze to the R but often only focuses on the L) Tracking/Visual Pursuits: Decreased smoothness of horizontal tracking;Decreased smoothness of vertical tracking Perception Perception: Impaired Inattention/Neglect: Does not attend to right visual field Praxis Praxis: Intact   Cognition Overall Cognitive Status: Impaired/Different from baseline Arousal/Alertness:  Awake/alert Orientation Level: Oriented X4 Attention: Focused;Sustained;Selective Sustained Attention: Impaired Selective Attention: Impaired Selective Attention Impairment: Functional basic Memory: Impaired Memory Impairment: Retrieval deficit;Decreased short term memory Awareness: Impaired Awareness Impairment: Emergent impairment Problem Solving: Impaired Problem Solving Impairment: Functional complex;Functional basic Executive Function: Self Monitoring;Self Correcting Self Monitoring: Impaired Self Correcting: Impaired Behaviors: Impulsive;Restless Safety/Judgment: Impaired  Sensation Sensation Light Touch: Impaired Detail Central sensation comments: B posterior calf numbness/tingling, pt reports "it feels asleep," otherwise [facility] Light Touch Impaired Details: Impaired RLE;Impaired LLE Proprioception: Impaired by gross assessment Coordination Gross Motor Movements are Fluid and Coordinated: No Fine Motor Movements are Fluid and Coordinated: No Coordination and Movement Description: hypometria and dismetria with mild ataxic gait Heel Shin Test: slow and deliberate B   Motor  Motor Motor: Abnormal postural alignment and control Motor - Skilled Clinical Observations: tends to lean to the R in sitting and standing Motor - Discharge Observations: tends to lean to the R in sitting and standing   Mobility Bed Mobility Bed Mobility: Rolling Right;Rolling Left;Supine to Sit;Sit to Supine Rolling Right: Independent with assistive device (HOB elevated at 30 deg due to MD orders) Rolling Left: Independent with assistive device (HOB elevated at 30 deg due to MD orders) Supine to Sit: Independent with assistive device (HOB elevated at 30 deg due to MD orders) Sit to Supine: Independent with assistive device (HOB elevated at 30 deg due to MD orders) Transfers Transfers: Sit to Stand;Stand to Sit;Stand Pivot Transfers Sit to Stand: Independent Stand to Sit: Independent Stand Pivot  Transfers: Independent Transfer (Assistive device): None Locomotion  Gait Ambulation: Yes Gait Assistance:  (very close supervision) Gait Distance (Feet): 1114 Feet Assistive device: None Gait Assistance Details: Verbal cues for precautions/safety Gait Assistance Details: cuing to decrease gait speed, awareness to surroundings Gait Gait: Yes Gait Pattern: Impaired Gait Pattern: Decreased hip/knee flexion - left;Decreased dorsiflexion - right;Decreased weight shift to left, ataxic Gait velocity: increased,  poor safety awareness Stairs / Additional Locomotion Stairs: Yes Stairs Assistance: Supervision/Verbal cueing Stair Management Technique: Two rails Number of Stairs: 20 Wheelchair Mobility Wheelchair Mobility: No   Trunk/Postural Assessment  Cervical Assessment Cervical Assessment: Exceptions to [facility] (pt is able to rotate head L<>R with cuing but maintains neutral cervical alignment) Thoracic Assessment Thoracic Assessment: Within Functional Limits Lumbar Assessment Lumbar Assessment: Within Functional Limits Postural Control Postural Control: Deficits on evaluation Righting Reactions: delayed, R lean in sit and standing; will occasionally lose his balance posteriorly sitting EOB or in standing Protective Responses: delayed   Balance Balance Balance Assessed: Yes Standardized Balance Assessment Standardized Balance Assessment: Berg Balance Test Berg Balance Test Sit to Stand: Able to stand  independently using hands (stands without hands but does push back with BLE onto mat table) Standing Unsupported: Able to stand 2 minutes with supervision (requires cuing for sustained attention to task & for pt to continue to stand without assist) Sitting with Back Unsupported but Feet Supported on Floor or Stool: Able to sit safely and securely 2 minutes Stand to Sit: Sits safely with minimal use of hands Transfers: Able to transfer with verbal cueing and /or supervision Standing  Unsupported with Eyes Closed: Able to stand 10 seconds with supervision Standing Ubsupported with Feet Together: Able to place feet together independently but unable to hold for 30 seconds (LOB to R) From Standing, Reach Forward with Outstretched Arm: Reaches forward but needs supervision From Standing Position, Pick up Object from Floor: Able to pick up shoe, needs supervision From Standing Position, Turn to Look Behind Over each Shoulder: Needs supervision when turning (minimal trunk rotation) Turn 360 Degrees: Needs close supervision or verbal cueing (cuing to complete 360 degree turn vs 180 degree) Standing Unsupported, Alternately Place Feet on Step/Stool: Needs assistance to keep from falling or unable to try (LOB to R with assist to correct) Standing Unsupported, One Foot in Front: Able to plae foot ahead of the other independently and hold 30 seconds Standing on One Leg: Able to lift leg independently and hold equal to or more than 3 seconds Total Score: 32/56   Extremity Assessment  RUE Assessment (Per OT assessment) Active Range of Motion (AROM) Comments: [facility] General Strength Comments: 4-5 shoulder; 4-/5 elbow and hand LUE Assessment (Per OT assessment) Active Range of Motion (AROM) Comments: [facility] General Strength Comments: 4-5 shoulder; 4-/5 elbow and hand RLE Assessment RLE Assessment: Within Functional Limits Active Range of Motion (AROM) Comments: WFL for all functional mobility, tight hamstrings General Strength Comments: Grossly 5/5 throughout, hip flexion 4+/5 LLE Assessment LLE Assessment: Within Functional Limits Active Range of Motion (AROM) Comments: WFL for all functional mobility, tight hamstrings General Strength Comments: Grossly 5/5 througout in sitting    Lavone Nian, PT, DPT 12/14/2019, 3:59 PM  Doreene Burke, PT, DPT 12/14/2019, 3:58 PM

## 2019-12-14 NOTE — Progress Notes (Signed)
Speech Language Pathology Daily Session Note  Patient Details  Name: Jason Skinner MRN: [id number] Date of Birth: [birth year]  Today's Date: 12/14/2019 SLP Individual Time: 1100-1155 SLP Individual Time Calculation (min): 55 min  Short Term Goals: Week 2: SLP Short Term Goal 1 (Week 2): STGs=LTGs due to ELOS  Skilled Therapeutic Interventions: Skilled treatment session focused on cognitive goals. SLP facilitated session by providing Mod A verbal cues for anticipatory awareness while generating a list of activities patient can participate in safely at home to maximize both physical and cognitive activity. Patient appeared lethargic throughout the session and required Min verbal cues for attention. Patient transferred back to bed at end of session. Patient left supine in bed with alarm on and all needs within reach. Continue with current plan of care.       Pain No/Denies Pain   Therapy/Group: Individual Therapy  Elai Vanwyk 12/14/2019, 12:10 PM

## 2019-12-14 NOTE — Discharge Summary (Signed)
Physician Discharge Summary  Patient ID: Jason Skinner MRN: [id number] DOB/AGE: February 20, 1991 29 y.o.  Admit date: 12/04/2019 Discharge date: 12/17/2019  Discharge Diagnoses:  Principal Problem:   TBI (traumatic brain injury) ([place]) Pain management Seizure prophylaxis Urinary retention Constipation  Discharged Condition: Stable  Significant Diagnostic Studies: CT HEAD WO CONTRAST  Result Date: 11/23/2019 CLINICAL DATA:  Mental status change of unknown cause. EXAM: CT HEAD WITHOUT CONTRAST TECHNIQUE: Contiguous axial images were obtained from the base of the skull through the vertex without intravenous contrast. COMPARISON:  09/06/2019 FINDINGS: Brain: Large right sided craniectomy unchanged from the most recent prior CT. The subdural drain has been removed. There is significant mass effect with flattening of the right frontal lobe and midline shift to the left of 1.5 cm. The mass effect and midline shift have significantly increased from the prior study. No evidence of an ischemic infarct. No new/acute intracranial hemorrhage. Mild enlargement of the left lateral ventricle compared to the prior CT, suggesting partial entrapment. Vascular: Unremarkable. Skull: No lesion.  Stable from the prior CT. Sinuses/Orbits: Globes and orbits are unremarkable. Visualized sinuses and mastoid air cells are clear. Other: Right-sided soft tissue air, hemorrhage and edema noted on the prior CT has resolved. IMPRESSION: 1. Interval increase in mass effect. Despite the large craniectomy defect on the right, the right cerebrum, principally the right frontal lobe, is flattened/compressed with significant midline shift to the left of 1.5 cm. Mild enlargement of the left lateral ventricle suggest partial entrapment. No transtentorial herniation. 2. No new/acute intracranial hemorrhage and no evidence of an ischemic infarct. Electronically Signed   By: Lajean Manes M.D.   On: 11/23/2019 11:20   DG Chest Port 1  View  Result Date: 11/23/2019 CLINICAL DATA:  Altered mental status, leukocytosis. EXAM: PORTABLE CHEST 1 VIEW COMPARISON:  Prior chest radiographs 09/14/2019 and earlier. FINDINGS: Heart size within normal limits. There is no appreciable airspace consolidation. No evidence of pleural effusion or pneumothorax. No acute bony abnormality identified. IMPRESSION: No evidence of active cardiopulmonary disease. Electronically Signed   By: Kellie Simmering DO   On: 11/23/2019 14:41    Labs:  Basic Metabolic Panel: Recent Labs  Lab 12/10/19 0557 12/14/19 0602  NA 134* 140  K 3.0* 3.5  CL 101 110  CO2 21* 20*  GLUCOSE 100* 96  BUN 6 7  CREATININE 0.84 0.87  CALCIUM 9.8 10.2    CBC: Recent Labs  Lab 12/10/19 0557 12/14/19 0602  WBC 9.5 8.7  HGB 12.8* 13.2  HCT 37.7* 38.8*  MCV 88.1 90.7  PLT 666* 678*    CBG: No results for input(s): GLUCAP in the last 168 hours.  Family history.  Hypertension and hyperlipidemia.  Denies any colon cancer or rectal cancer  Brief HPI:   Jason Skinner is a 29 y.o. right-handed limited English speaking male well-known to rehab services from admission 09/20/2019 to 10/05/2019 after TBI when he fell from a ladder approximately 20 feet and underwent right frontotemporal craniectomy evacuation of acute subdural hematoma and insertion of right frontal ventriculostomy insertion of craniotomy flap of the abdomen subcutaneous tissue 09/05/2019.  He was discharged to home ambulating without assistance.  Per chart review lives with friends and her brother.  Presented 11/23/2019 with noted decrease in mobility altered mental status as well as increased headache.  Admission chemistries potassium 3.2 glucose 109 alcohol negative urine drug screen negative lactic acid 1.2 urine culture insignificant growth.  Cranial CT scan showed interval increase in mass-effect and despite  the large craniectomy defect on the right the right cerebrum principally the right frontal lobe is  flattened and compressed with midline shift to the left to 1.5 cm.  No new intracranial hemorrhage noted.  Patient was seen by neurosurgery for syndrome of the Trephined.  Patient underwent retrieval craniectomy flap from the abdomen cranioplasty 11/28/2019 per Dr. Arnoldo Morale.  Maintained on Keppra for seizure prophylaxis.  He was cleared for Lovenox 12/02/2019 for DVT prophylaxis.  Bouts of urinary retention placed on low-dose Urecholine.  Tolerating regular diet.  Therapy evaluations completed and patient was admitted for a comprehensive rehab program   Hospital Course: Aerik Polan was admitted to rehab 12/04/2019 for inpatient therapies to consist of PT, ST and OT at least three hours five days a week. Past admission physiatrist, therapy team and rehab RN have worked together to provide customized collaborative inpatient rehab.  Pertaining to patient's TBI/syndrome of Trephined status post cranioplasty 11/28/2019.  Site healing nicely he would follow Dr. Arnoldo Morale.  Initially on subcutaneous Lovenox for DVT prophylaxis discontinued this patient was ambulatory.  Mood stabilization with low-dose Seroquel.  Pain managed with use of scheduled Topamax he was using hydrocodone Robaxin on a limited basis for pain and muscle spasms.  Seizure prophylaxis with Keppra no seizure activity.  Bouts of urinary retention admission Urecholine since discontinued patient was emptying his bladder.  Bouts of constipation placed on MiraLAX daily no nausea vomiting.   Blood pressures were monitored on TID basis and controlled      Rehab course: During patient's stay in rehab weekly team conferences were held to monitor patient's progress, set goals and discuss barriers to discharge. At admission, patient required minimal assist stand pivot transfer minimal guard supine to sit and sit supine.  Minimal assist upper body bathing mod assist lower body bathing minimal assist upper body dressing moderate assist lower body  dressing  Physical exam.  Blood pressure 118/70 pulse 80 temperature 99 respirations 18 oxygen saturation 90% room air HEENT Head.  Normocephalic and atraumatic Eyes.  Pupils round and reactive to light no discharge.nystagmus Neck.  Supple nontender no JVD without thyromegaly Cardiac regular rate rhythm without any extra sounds or murmur heard Abdomen.  Soft nontender positive bowel sounds staple over abdominal site Respiratory effort normal no respiratory distress without wheeze Neuro.  Alert no acute distress language barrier makes eye contact with examiner follows basic commands.  Right upper extremity 4/5 strength otherwise 5/5   He/  has had improvement in activity tolerance, balance, postural control as well as ability to compensate for deficits. He/ has had improvement in functional use RUE/LUE  and RLE/LLE as well as improvement in awareness.  Gait training in the hallway with supervision assist minimal cues supine to sit transfers without assistance.  Dons his shoes edge of bed without assistance.  Dynamic balance training while engaged in fine motor tasks patient able to stand x6 frames and then 3 additional frames with supervision.  Patient made excellent progress overall with visual scanning and awareness improved attention.  Continued demonstrate deficits in muscle weakness decreased cardiorespiratory endurance.  Moderate assist verbal cues for anticipatory awareness.  It was discussed with friends and family the need for supervision on discharge for safety and full teaching was completed       Disposition: Discharged to home    Diet: Regular  Special Instructions: No driving smoking or alcohol  Medications at discharge 1.  Tylenol as needed 2.  Hydrocodone 1 tablet every 4 hours as needed moderate  pain 3.  Keppra 500 mg p.o. twice daily 4.  Robaxin 500 mg every 8 hours as needed muscle spasms 5.  MiraLAX daily hold for loose stools 6.  Antivert 12.5 mg p.o. 3 times  daily as needed dizziness 7.  Seroquel 50 mg p.o. nightly 8.  Topamax and 5 mg p.o. twice daily  30-35 minutes were spent completing discharge summary and discharge planning  Discharge Instructions    Ambulatory referral to Physical Medicine Rehab   Complete by: As directed    Moderate complexity follow-up 1 to 2 weeks TBI       Follow-up Information    Meredith Staggers, MD Follow up.   Specialty: Physical Medicine and Rehabilitation Why: Office to call for appointment Contact information: [address] [place] [address] [phone]        Newman Pies, MD Follow up.   Specialty: Neurosurgery Why: Call for appointment Contact information: [address] [place] [address] [phone]               Signed: Lavon Paganini [place] 12/17/2019, 5:09 AM

## 2019-12-14 NOTE — Plan of Care (Signed)
  Problem: RH SKIN INTEGRITY Goal: RH STG SKIN FREE OF INFECTION/BREAKDOWN Description: With min assist  Outcome: Progressing   Problem: RH SAFETY Goal: RH STG ADHERE TO SAFETY PRECAUTIONS W/ASSISTANCE/DEVICE Description: STG Adhere to Safety Precautions With cues/reminders Assistance/Device. Outcome: Progressing

## 2019-12-15 ENCOUNTER — Inpatient Hospital Stay (HOSPITAL_COMMUNITY): Payer: Self-pay

## 2019-12-15 NOTE — Progress Notes (Signed)
Occupational Therapy Session Note  Patient Details  Name: Jason Skinner MRN: [id number] Date of Birth: [birth year]  Today's Date: 12/15/2019 OT Individual Time: 3837-7939 OT Individual Time Calculation (min): 57 min    Short Term Goals: Week 1:  OT Short Term Goal 1 (Week 1): Pt will demonstrate improved endurance to be able to complete all B/D within 15 minutes with no rest breaks. OT Short Term Goal 1 - Progress (Week 1): Met OT Short Term Goal 2 (Week 1): Pt will be able to ambulate to toilet with CGA with LRAD. OT Short Term Goal 2 - Progress (Week 1): Met OT Short Term Goal 3 (Week 1): Pt will complete toileting with S. OT Short Term Goal 3 - Progress (Week 1): Met  Skilled Therapeutic Interventions/Progress Updates:    1;1. Pt received in bed agreeable to OT. Pt completes dressing at EOB with S overall with VC for wider stance and anterior weigth shift during sit to stand to improve balance. Pt grooms at sink with S. Pt ambulates throughotu session with no AD and S-CGA with min staggering mostly L. Seated ball toss naming foods with mat elevated for no foot support to call on trunk control with pt demo R lean with VC for correct. Pt completes ambulating dribbling of ball while naming animals with slowing of walking speed d/t decreased dual task processing. Edu re implications. Pt completes biodex and able to recall doing activity yesterday in tx. Pt requires VC for using head control to initiate weight shifting d/t posterior lean. Exited sessionw iht pt seated in bed,e xit alarm on and call light tin reach  Therapy Documentation Precautions:  Precautions Precautions: Fall Precaution Comments: flat affect, poor insight  Restrictions Weight Bearing Restrictions: No General:   Vital Signs: Therapy Vitals Temp: 97.6 F (36.4 C) Temp Source: Oral Pulse Rate: 78 Resp: 16 BP: 98/63 Patient Position (if appropriate): Lying Oxygen Therapy SpO2: 98 % O2 Device: Room  Air Pain:   ADL: ADL Eating: Set up Grooming: Supervision/safety Where Assessed-Grooming: Standing at sink Upper Body Bathing: Supervision/safety Where Assessed-Upper Body Bathing: Shower Lower Body Bathing: Supervision/safety Where Assessed-Lower Body Bathing: Shower Upper Body Dressing: Supervision/safety Where Assessed-Upper Body Dressing: Edge of bed Lower Body Dressing: Supervision/safety Where Assessed-Lower Body Dressing: Edge of bed Toileting: Supervision/safety Where Assessed-Toileting: Glass blower/designer: Close supervision Armed forces technical officer Method: Counselling psychologist: Energy manager: Unable to English as a second language teacher: Close supervision Social research officer, government Method: Heritage manager: Radio broadcast assistant, Systems analyst    Praxis   Exercises:   Other Treatments:     Therapy/Group: Individual Therapy  Tonny Branch 12/15/2019, 7:00 AM

## 2019-12-15 NOTE — Progress Notes (Signed)
[facility] PHYSICAL MEDICINE & REHABILITATION PROGRESS NOTE   Subjective/Complaints:   No pain, no issues- per pt, no issues- with interpretor.   ROS:  Pt denies SOB, abd pain, CP, N/V/C/D, and vision changes  Objective:   No results found. Recent Labs    12/14/19 0602  WBC 8.7  HGB 13.2  HCT 38.8*  PLT 678*   Recent Labs    12/14/19 0602  NA 140  K 3.5  CL 110  CO2 20*  GLUCOSE 96  BUN 7  CREATININE 0.87  CALCIUM 10.2    Intake/Output Summary (Last 24 hours) at 12/15/2019 1425 Last data filed at 12/15/2019 0935 Gross per 24 hour  Intake 600 ml  Output --  Net 600 ml        Physical Exam: Vital Signs Blood pressure 100/63, pulse 91, temperature 98.7 F (37.1 C), temperature source Oral, resp. rate 14, height 5\' 7"  (1.702 m), weight 71.2 kg, SpO2 100 %.   Constitutional: No distress . Vital signs reviewed. Laying in bed; NAD HEENT: EOMI, oral membranes moist Neck: supple Cardiovascular: RRR Respiratory/Chest: CTA B/L- no W/R/R- good air movement GI/Abdomen: Soft, NT, ND, (+)BS  Ext: no clubbing, cyanosis, or edema Psych: appropriate, but confused Skin:crani incision CDI, abdominal incision CDI staples out. Swelling decreasing around flap.  Neuro: very alert and follows commands. Communicates in [place], limited broke english.  follows basic commands. Moves all 4's L>R.. fair insight and awareness Musculoskeletal: normal ROM     Assessment/Plan: 1. 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  Physiatrist is providing close team supervision and 24 hour management of active medical problems listed below.  Physiatrist and rehab team continue to assess barriers to discharge/monitor patient progress toward functional and medical goals  Care Tool:  Bathing    Body parts bathed by patient: Right arm, Left arm, Chest, Abdomen, Front  perineal area, Buttocks, Right upper leg, Left upper leg, Right lower leg, Left lower leg, Face         Bathing assist Assist Level: Supervision/Verbal cueing     Upper Body Dressing/Undressing Upper body dressing   What is the patient wearing?: Pull over shirt    Upper body assist Assist Level: Supervision/Verbal cueing    Lower Body Dressing/Undressing Lower body dressing      What is the patient wearing?: Underwear/pull up, Pants     Lower body assist Assist for lower body dressing: Supervision/Verbal cueing     Toileting Toileting    Toileting assist Assist for toileting: Supervision/Verbal cueing Assistive Device Comment: urinal   Transfers Chair/bed transfer  Transfers assist     Chair/bed transfer assist level: Supervision/Verbal cueing     Locomotion Ambulation   Ambulation assist      Assist level: Supervision/Verbal cueing Assistive device: No Device Max distance: 150 ft   Walk 10 feet activity   Assist     Assist level: Supervision/Verbal cueing Assistive device: No Device   Walk 50 feet activity   Assist    Assist level: Supervision/Verbal cueing Assistive device: No Device    Walk 150 feet activity   Assist    Assist level: Supervision/Verbal cueing Assistive device: No Device    Walk 10 feet on uneven surface  activity   Assist     Assist level: Minimal Assistance - Patient > 75% Assistive device:  (none)   Wheelchair     Assist Will patient use  wheelchair at discharge?: No Type of Wheelchair: Manual    Wheelchair assist level: Minimal Assistance - Patient > 75% Max wheelchair distance: 150 ft    Wheelchair 50 feet with 2 turns activity    Assist        Assist Level: Minimal Assistance - Patient > 75%   Wheelchair 150 feet activity     Assist      Assist Level: Minimal Assistance - Patient > 75%   Blood pressure 100/63, pulse 91, temperature 98.7 F (37.1 C), temperature source  Oral, resp. rate 14, height 5\' 7"  (1.702 m), weight 71.2 kg, SpO2 100 %.  Medical Problem List and Plan: 1.  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 Arnoldo Morale.             -patient may shower             -ELOS/Goals: minA in 10/11  --Continue CIR therapies including PT, OT, and SLP  2.  Antithrombotics: -DVT/anticoagulation: ambulation             -antiplatelet therapy: N/A 3. Pain Management: Topamax 75 mg twice daily, hydrocodone/Robaxin as needed. -well controlled 10/8  10/9- con't pain meds prn-  4. Mood: Advised emotional support. In positive mood             -antipsychotic agents: seroquel 5. Neuropsych: This patient is capable of making decisions on his own behalf.  -insomnia: 10/7 increased seroquel to 50mg  tonight   -10/8 seems to have slept better-->continue same dose   -continue sleep chart   6. Skin/Wound Care: Wound care of abdominal and cranial sites  -both areas remain clean and dry with staples 7. Fluids/Electrolytes/Nutrition: encourage appropriate PO  -I personally reviewed the patient's labs today.    -mild hyponatremia---> improved to 134 10/4  -hypokalemia--> 3.5 10/8----> continue supplementation    8.  Seizure prophylaxis.  Keppra 500 mg twice daily 9.  Urinary retention.  Urecholine 10 mg 3 times daily.    -pt is continent and emptying bladder  -off urecholine 10.  Constipation.  MiraLAX daily. Senna-s.    -moving bowels 11. Leukocytosis  -down further to 9.5 today 10/4--->8.7 10/8   LOS: 11 days A FACE TO FACE EVALUATION WAS PERFORMED  Nora Sabey 12/15/2019, 2:25 PM

## 2019-12-16 ENCOUNTER — Inpatient Hospital Stay (HOSPITAL_COMMUNITY): Payer: Self-pay

## 2019-12-16 ENCOUNTER — Inpatient Hospital Stay (HOSPITAL_COMMUNITY): Payer: Self-pay | Admitting: Speech Pathology

## 2019-12-16 NOTE — Progress Notes (Signed)
Occupational Therapy Session Note  Patient Details  Name: Jason Skinner MRN: [id number] Date of Birth: 1990/10/03  Today's Date: 12/16/2019 OT Individual Time: 0920-1000 OT Individual Time Calculation (min): 40 min    Short Term Goals: Week 2:  OT Short Term Goal 1 (Week 2): STGs = LTGs  Skilled Therapeutic Interventions/Progress Updates:    Pt received supine, OT initially using video interpretor until an in person one arrived and was present for remainder of session. Pt agreeable to take shower, no c/o pain. Pt with VERY flat affect throughout session- this patient is known to this OT from previous admission and his affect has worsened. Pt completed bed mobility with mod I and ambulatory transfer into the bathroom with (S). Good safety awareness when removing clothing and transferring into shower. All bathing completed at (S) level, perhaps a bit perseverative at times with washing but able to move on without cueing. Reinforced need to return to using shower chair at home. Pt told about shampoo being present and then using it to wash his body. Pt donned all clothing with (S) seated on BSC. Pt very short with OT and stating he doesn't remember previous interactions. Pt completed oral care at the sink in standing with set up assist. Pt returned to supine in bed, visibly fatigued. With assistance of Ana, interpreter, several education sheets re TBI were printed and highlighted for important information since no family arrived for education. Pt was left supine with all needs met, bed alarm set.   Therapy Documentation Precautions:  Precautions Precautions: Fall Precaution Comments: flat affect, poor insight  Restrictions Weight Bearing Restrictions: No  Therapy/Group: Individual Therapy  Curtis Sites 12/16/2019, 6:44 AM

## 2019-12-16 NOTE — Progress Notes (Signed)
Occupational Therapy Discharge Summary  Patient Details  Name: Jason Skinner MRN: [id number] Date of Birth: 1991/01/30   Patient has met 9 of 9 long term goals due to improved activity tolerance, improved balance, postural control, ability to compensate for deficits, functional use of  RIGHT upper extremity, improved attention, improved awareness and improved coordination.  Patient to discharge at overall Supervision level.  Patient's care partner is independent to provide the necessary physical and cognitive assistance at discharge.    Reasons goals not met: n/a  Recommendation:  Patient will benefit from ongoing skilled OT services in home health setting to continue to advance functional skills in the area of BADL.  Equipment: No equipment provided - pt already has a shower chair  Reasons for discharge: treatment goals met  Patient/family agrees with progress made and goals achieved: Yes  OT Discharge Precautions/Restrictions  Precautions Precautions: Fall ADL ADL Eating: Set up Grooming: Supervision/safety Where Assessed-Grooming: Standing at sink Upper Body Bathing: Supervision/safety Where Assessed-Upper Body Bathing: Shower Lower Body Bathing: Supervision/safety Where Assessed-Lower Body Bathing: Shower Upper Body Dressing: Supervision/safety Where Assessed-Upper Body Dressing: Edge of bed Lower Body Dressing: Supervision/safety Where Assessed-Lower Body Dressing: Edge of bed Toileting: Supervision/safety Where Assessed-Toileting: Glass blower/designer: Close supervision Toilet Transfer Method: Counselling psychologist: Energy manager: Unable to English as a second language teacher: Close supervision Social research officer, government Method: Heritage manager: Radio broadcast assistant, Grab bars Vision Patient Visual Report: No change from baseline Ocular Range of Motion: Within Functional Limits Alignment/Gaze Preference:  Gaze left (pt can gaze to the R but often only focuses on the L) Tracking/Visual Pursuits: Decreased smoothness of horizontal tracking;Decreased smoothness of vertical tracking Visual Fields: No apparent deficits Perception  Perception: Impaired Inattention/Neglect: Does not attend to right visual field Praxis Praxis: Intact Cognition Overall Cognitive Status: Impaired/Different from baseline Selective Attention: Impaired Selective Attention Impairment: Functional basic Problem Solving: Impaired Problem Solving Impairment: Functional complex;Functional basic Sensation Sensation Light Touch: Appears Intact Hot/Cold: Appears Intact Proprioception: Appears Intact Stereognosis: Appears Intact Coordination Gross Motor Movements are Fluid and Coordinated: No Fine Motor Movements are Fluid and Coordinated: No Coordination and Movement Description: limited due to generalized weakness, slow movements but able to open containers without A; slow gross motor movement patterns Motor  Motor Motor: Abnormal postural alignment and control Motor - Skilled Clinical Observations: tends to lean to the R in sitting and standing Mobility    Supervision to mod I overall  Trunk/Postural Assessment  Postural Control Postural Control: Deficits on evaluation Righting Reactions: delayed, R lean in sit and standing; will occasionally lose his balance posteriorly sitting EOB or in standing Protective Responses: delayed  Balance Static Sitting Balance Static Sitting - Level of Assistance: 7: Independent Dynamic Sitting Balance Dynamic Sitting - Level of Assistance: 5: Stand by assistance Static Standing Balance Static Standing - Level of Assistance: 5: Stand by assistance Dynamic Standing Balance Dynamic Standing - Level of Assistance: 5: Stand by assistance Extremity/Trunk Assessment RUE Assessment Active Range of Motion (AROM) Comments: WFL General Strength Comments: 4-5 shoulder; 4-/5 elbow and  hand LUE Assessment Active Range of Motion (AROM) Comments: WFL General Strength Comments: 4-5 shoulder; 4-/5 elbow and hand  Curtis Sites 12/16/2019, 2:58 PM

## 2019-12-16 NOTE — Progress Notes (Signed)
PHYSICAL MEDICINE & REHABILITATION PROGRESS NOTE   Subjective/Complaints:   With interpretor, pt denies pain- also per nursing chart, slept well- getting shower this AM supervision level.   .   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 Recent Labs    12/14/19 0602  WBC 8.7  HGB 13.2  HCT 38.8*  PLT 678*   Recent Labs    12/14/19 0602  NA 140  K 3.5  CL 110  CO2 20*  GLUCOSE 96  BUN 7  CREATININE 0.87  CALCIUM 10.2    Intake/Output Summary (Last 24 hours) at 12/16/2019 1438 Last data filed at 12/15/2019 1755 Gross per 24 hour  Intake 118 ml  Output --  Net 118 ml        Physical Exam: Vital Signs Blood pressure 102/69, pulse 89, temperature 98.2 F (36.8 C), resp. rate 20, height 5\' 7"  (1.702 m), weight 71.2 kg, SpO2 100 %.   Constitutional:sitting up in shower- just finished- supervision per OT, interpretor in room, NAD HEENT: EOMI, oral membranes moist Neck: supple Cardiovascular: RRR Respiratory/Chest: CTA B/L- no W/R/R- good air movement GI/Abdomen: Soft, NT, ND, (+)BS  Ext: no clubbing, cyanosis, or edema Psych: appropriate, but confused Skin:crani incision CDI, abdominal incision CDI staples out. Swelling decreasing around flap.  Neuro: very alert and follows commands. Communicates in [place], limited broke english- needs interpretor; .  follows basic commands. Moves all 4's L>R.. fair insight and awareness Musculoskeletal: normal ROM     Assessment/Plan: 1. 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  Physiatrist is providing close team supervision and 24 hour management of active medical problems listed below.  Physiatrist and rehab team continue to assess barriers to discharge/monitor patient progress toward functional and medical goals  Care Tool:  Bathing    Body parts bathed by patient:  Right arm, Left arm, Chest, Abdomen, Front perineal area, Buttocks, Right upper leg, Left upper leg, Right lower leg, Left lower leg, Face         Bathing assist Assist Level: Supervision/Verbal cueing     Upper Body Dressing/Undressing Upper body dressing   What is the patient wearing?: Pull over shirt    Upper body assist Assist Level: Supervision/Verbal cueing    Lower Body Dressing/Undressing Lower body dressing      What is the patient wearing?: Underwear/pull up, Pants     Lower body assist Assist for lower body dressing: Supervision/Verbal cueing     Toileting Toileting    Toileting assist Assist for toileting: Supervision/Verbal cueing Assistive Device Comment: urinal   Transfers Chair/bed transfer  Transfers assist     Chair/bed transfer assist level: Independent     Locomotion Ambulation   Ambulation assist      Assist level: Supervision/Verbal cueing Assistive device: No Device Max distance: 1114 ft   Walk 10 feet activity   Assist     Assist level: Supervision/Verbal cueing Assistive device: No Device   Walk 50 feet activity   Assist    Assist level: Supervision/Verbal cueing Assistive device: No Device    Walk 150 feet activity   Assist    Assist level: Supervision/Verbal cueing Assistive device: No Device    Walk 10 feet on uneven surface  activity   Assist     Assist level: Contact Guard/Touching assist Assistive device:  (none)   Wheelchair     Assist Will patient  use wheelchair at discharge?: No (patient is a functional ambulator) Type of Wheelchair: Manual Wheelchair activity did not occur: N/A  Wheelchair assist level: Minimal Assistance - Patient > 75% Max wheelchair distance: 150 ft    Wheelchair 50 feet with 2 turns activity    Assist    Wheelchair 50 feet with 2 turns activity did not occur: N/A   Assist Level: Minimal Assistance - Patient > 75%   Wheelchair 150 feet activity      Assist  Wheelchair 150 feet activity did not occur: N/A   Assist Level: Minimal Assistance - Patient > 75%   Blood pressure 102/69, pulse 89, temperature 98.2 F (36.8 C), resp. rate 20, height 5\' 7"  (1.702 m), weight 71.2 kg, SpO2 100 %.  Medical Problem List and Plan: 1.  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 Arnoldo Morale.             -patient may shower             -ELOS/Goals: minA in 10/11  --Continue CIR therapies including PT, OT, and SLP  2.  Antithrombotics: -DVT/anticoagulation: ambulation             -antiplatelet therapy: N/A 3. Pain Management: Topamax 75 mg twice daily, hydrocodone/Robaxin as needed. -well controlled 10/8  10/10- denies pain- con't pain meds and prn 4. Mood: Advised emotional support. In positive mood             -antipsychotic agents: seroquel 5. Neuropsych: This patient is capable of making decisions on his own behalf.  -insomnia: 10/7 increased seroquel to 50mg  tonight   -10/8 seems to have slept better-->continue same dose   -continue sleep chart   10/10- sleeping much better per nursing  6. Skin/Wound Care: Wound care of abdominal and cranial sites  -both areas remain clean and dry with staples 7. Fluids/Electrolytes/Nutrition: encourage appropriate PO  -I personally reviewed the patient's labs today.    -mild hyponatremia---> improved to 134 10/4  -hypokalemia--> 3.5 10/8----> continue supplementation    8.  Seizure prophylaxis.  Keppra 500 mg twice daily 9.  Urinary retention.  Urecholine 10 mg 3 times daily.    -pt is continent and emptying bladder  -off urecholine 10.  Constipation.  MiraLAX daily. Senna-s.    -moving bowels  10/10- LBM 10/7- will encourage sorbitol if feels constipated.  11. Leukocytosis  -down further to 9.5 today 10/4--->8.7 10/8   LOS: 12 days A FACE TO FACE EVALUATION WAS PERFORMED  Phoebie Shad 12/16/2019,  2:38 PM

## 2019-12-16 NOTE — Progress Notes (Signed)
Remain on Sleep chart, slept the majority of shift w/o distress or discomfort, continue regime

## 2019-12-16 NOTE — Progress Notes (Addendum)
Physical Therapy Session Note  Patient Details  Name: Shields Pautz MRN: [id number] Date of Birth: 12/30/1990  Today's Date: 12/16/2019 PT Individual Time: 1007-1050 PT Individual Time Calculation (min): 43 min   Short Term Goals: Week 2:  PT Short Term Goal 1 (Week 2): STG = LTG due to short ELOS.  Skilled Therapeutic Interventions/Progress Updates:      Patient in bed upon PT arrival. Patient alert and agreeable to PT session. Patient denied pain during session. Live interpreter present throughout session  Therapeutic Activity: Bed Mobility: Patient performed rolling R/L and supine to/from sit with mod I with HOB elevated to 30 deg per MD orders. Educated patient on elevated his head on multiple pillows at home. Transfers: Patient performed sit to/from stand  From various household surfaces throughout the session independently without an AD. Patient performed a simulated sedan height car transfer with close supervision using car frame for steadying assist. Provided cues for safe technique, patient attempted to perform step-in technique then continued with this technique despite cues from therapist for sitting first prior to bringing LEs into the car for safety. Patient did not demonstrate any LOB with self-selected technique.  Five times Sit to Stand Test (FTSS) Method: Use a straight back chair with a solid seat that is 16-18" high. Ask participant to sit on the chair with arms folded across their chest.   Instructions: "Stand up and sit down as quickly as possible 5 times, keeping your arms folded across your chest."   Measurement: Stop timing when the participant stands the 5th time.  TIME: __14.2____ (in seconds)  Times > 13.6 seconds is associated with increased disability and morbidity (Guralnik, 2000) Times > 15 seconds is predictive of recurrent falls in healthy individuals aged 68 and older (Buatois, et al., 2008) Normal performance values in community dwelling  individuals aged 49 and older (Bohannon, 2006): o 60-69 years: 11.4 seconds o 70-79 years: 12.6 seconds o 80-89 years: 14.8 seconds  MCID: ? 2.3 seconds for Vestibular Disorders (Meretta, 2006)  Gait Training:  Patient ambulated around the unit, >150 feet x3, without an AD with close supervision for safety due to decreased balance and minor ataxic gait. Ambulated with decreased L weight shift, decreased B step height and length, decreased gait speed, and decreased trunk rotation and arm swing. Provided verbal cues for consistent foot placement, increased arm swing to promote increased gait speed and balance, and increased visual scanning of environment. Patient unable to locate the gym or his room when cues without max cuing despite walking this path frequently throughout stay.  Patient ascended/descended 20 steps using B rails with close supervision for safety. Performed reciprocal gait pattern.  Patient ambulated up/down a ramp, over 10 feet of mulch (unlevel surface), and up/down a curb to simulate community ambulation over unlevel surfaces with CGA-close supervision without and AD.   6 Min Walk Test:  Instructed patient to ambulate as quickling and as safely as possible for 6 minutes using LRAD. Patient was allowed to take standing rest breaks without stopping the test, but if he required a sitting rest break the clock would be stopped and the test would be over.  Results: 9371 feet with close supervision without an AD RPE 8/10 at end of test.  Educated patient on balance and endurance deficits, consequences of subsequent TBI, and 24/7 supervision for safety throughout session.   Patient in bed at end of session with breaks locked, bed alarm set, and all needs within reach.    Therapy  Documentation Precautions:  Precautions Precautions: Fall Precaution Comments: flat affect, poor insight  Restrictions Weight Bearing Restrictions: No   Therapy/Group: Individual Therapy  Danielly Ackerley L  Charlies Rayburn PT, DPT  12/16/2019, 12:19 PM

## 2019-12-16 NOTE — Progress Notes (Signed)
Patient was assisted to BR with NT( Tanya F.,NT) she informed me that patient was attempting to sit on toilet and slipped bur was able to grab safety bar to catch him. She states she witness this occurrence and the patient did not hit his head or any body part.Upon assessing the patient there is no evidence of injury, good ROM to right hand and wrist area, he denies any pain or discomfort. Made as comfortable as possible, alert /oriented x4 VSS recorded, patient assisted back to bed by NT,monitor and assisted, Bed alarm and call bell within reach.

## 2019-12-16 NOTE — Progress Notes (Signed)
Patient refuses SCDs. 

## 2019-12-17 ENCOUNTER — Other Ambulatory Visit (HOSPITAL_COMMUNITY): Payer: Self-pay | Admitting: Physician Assistant

## 2019-12-17 DIAGNOSIS — S069X0S Unspecified intracranial injury without loss of consciousness, sequela: Secondary | ICD-10-CM

## 2019-12-17 MED ORDER — TOPIRAMATE 25 MG PO TABS: 75.0000 mg | ORAL_TABLET | Freq: Two times a day (BID) | ORAL | 0 refills | Status: DC

## 2019-12-17 MED ORDER — HYDROCODONE-ACETAMINOPHEN 5-325 MG PO TABS
1.0000 | ORAL_TABLET | ORAL | 0 refills | Status: DC | PRN
Start: 2019-12-17 — End: 2019-12-17

## 2019-12-17 MED ORDER — ACETAMINOPHEN 325 MG PO TABS: 650.0000 mg | ORAL_TABLET | ORAL | Status: AC | PRN

## 2019-12-17 MED ORDER — METHOCARBAMOL 500 MG PO TABS: 500.0000 mg | ORAL_TABLET | Freq: Three times a day (TID) | ORAL | 0 refills | Status: DC | PRN

## 2019-12-17 MED ORDER — LEVETIRACETAM 500 MG PO TABS: 500.0000 mg | ORAL_TABLET | Freq: Two times a day (BID) | ORAL | 1 refills | Status: DC

## 2019-12-17 MED ORDER — QUETIAPINE FUMARATE 50 MG PO TABS: 50.0000 mg | ORAL_TABLET | Freq: Every day | ORAL | 0 refills | Status: DC

## 2019-12-17 MED FILL — TOPIRAMATE 25 MG TABLET: 25 | 10 days supply | Qty: 60 | Fill #0

## 2019-12-17 MED FILL — levETIRAcetam 500 MG TABS: 500 | 30 days supply | Qty: 60 | Fill #0

## 2019-12-17 MED FILL — METHOCARBAMOL 500 MG TABS: 500 | 20 days supply | Qty: 60 | Fill #0

## 2019-12-17 MED FILL — QUETIAPINE FUMARATE 50 MG T: 50 | 30 days supply | Qty: 30 | Fill #0

## 2019-12-17 MED FILL — HYDROCODON-APAP 5-325: 5-325 | 5 days supply | Qty: 30 | Fill #0

## 2019-12-17 NOTE — Progress Notes (Signed)
[place] PHYSICAL MEDICINE & REHABILITATION PROGRESS NOTE   Subjective/Complaints: Family member at bedside able to interpret Pt unaware of discharge, denies pains  .   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 No results for input(s): WBC, HGB, HCT, PLT in the last 72 hours. No results for input(s): NA, K, CL, CO2, GLUCOSE, BUN, CREATININE, CALCIUM in the last 72 hours.  Intake/Output Summary (Last 24 hours) at 12/17/2019 0908 Last data filed at 12/17/2019 0527 Gross per 24 hour  Intake 338 ml  Output --  Net 338 ml        Physical Exam: Vital Signs Blood pressure 103/72, pulse 91, temperature 98.5 F (36.9 C), resp. rate 18, height 5\' 7"  (1.702 m), weight 71.2 kg, SpO2 99 %.   Constitutional:resting in bed alert  NAD HEENT: EOMI, oral membranes moist  General: No acute distress Mood and affect are appropriate Heart: Regular rate and rhythm no rubs murmurs or extra sounds Lungs: Clear to auscultation, breathing unlabored, no rales or wheezes Abdomen: Positive bowel sounds, soft nontender to palpation, nondistended Extremities: No clubbing, cyanosis, or edema Skin: crani incision well healed  Neurologic: 4/5 in bilateral deltoid, bicep, tricep, grip, hip flexor, knee extensors, ankle dorsiflexor and plantar flexor  Musculoskeletal: Full range of motion in all 4 extremities. No joint swelling       Assessment/Plan: 1. 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  Stable for D/C today F/u PCP in 3-4 weeks F/u PM&R 2 weeks F/u NS- Dr Arnoldo Morale per [facility] Neuro office See D/C summary See D/C instructions Care Tool:  Bathing    Body parts bathed by patient: Right arm, Left arm, Chest, Abdomen, Front perineal area, Buttocks, Right upper leg, Left upper leg, Right lower leg, Left lower leg, Face         Bathing assist Assist Level: Supervision/Verbal cueing     Upper Body Dressing/Undressing Upper body dressing   What  is the patient wearing?: Pull over shirt    Upper body assist Assist Level: Supervision/Verbal cueing    Lower Body Dressing/Undressing Lower body dressing      What is the patient wearing?: Underwear/pull up, Pants     Lower body assist Assist for lower body dressing: Supervision/Verbal cueing     Toileting Toileting    Toileting assist Assist for toileting: Supervision/Verbal cueing Assistive Device Comment: urinal   Transfers Chair/bed transfer  Transfers assist     Chair/bed transfer assist level: Independent     Locomotion Ambulation   Ambulation assist      Assist level: Supervision/Verbal cueing Assistive device: No Device Max distance: 1114 ft   Walk 10 feet activity   Assist     Assist level: Supervision/Verbal cueing Assistive device: No Device   Walk 50 feet activity   Assist    Assist level: Supervision/Verbal cueing Assistive device: No Device    Walk 150 feet activity   Assist    Assist level: Supervision/Verbal cueing Assistive device: No Device    Walk 10 feet on uneven surface  activity   Assist     Assist level: Contact Guard/Touching assist Assistive device:  (none)   Wheelchair     Assist Will patient use wheelchair at discharge?: No (patient is a functional ambulator) Type of Wheelchair: Manual Wheelchair activity did not occur: N/A  Wheelchair assist level: Minimal Assistance - Patient > 75% Max wheelchair distance: 150 ft    Wheelchair 50 feet with 2 turns activity  Assist    Wheelchair 50 feet with 2 turns activity did not occur: N/A   Assist Level: Minimal Assistance - Patient > 75%   Wheelchair 150 feet activity     Assist  Wheelchair 150 feet activity did not occur: N/A   Assist Level: Minimal Assistance - Patient > 75%   Blood pressure 103/72, pulse 91, temperature 98.5 F (36.9 C), resp. rate 18, height 5\' 7"  (1.702 m), weight 71.2 kg, SpO2 99 %.  Medical Problem List  and Plan: 1.  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 Arnoldo Morale.             -patient may shower             -ELOS/Goals: minA in 10/11  --Continue CIR therapies including PT, OT, and SLP  2.  Antithrombotics: -DVT/anticoagulation: ambulation             -antiplatelet therapy: N/A 3. Pain Management: Topamax 75 mg twice daily, hydrocodone/Robaxin as needed. -well controlled 10/11 4. Mood: Advised emotional support. In positive mood             -antipsychotic agents: seroquel 5. Neuropsych: This patient is capable of making decisions on his own behalf.  -insomnia: improved on seroquel 6. Skin/Wound Care: Wound care of abdominal and cranial sites  -both areas remain clean and dry with staples 7. Fluids/Electrolytes/Nutrition: encourage appropriate PO  -I personally reviewed the patient's labs today.    -mild hyponatremia---> improved to 134 10/4  -hypokalemia--> 3.5 10/8----> continue supplementation    8.  Seizure prophylaxis.  Keppra 500 mg twice daily 9.  Urinary retention.  Urecholine 10 mg 3 times daily.    -pt is continent and emptying bladder  -off urecholine 10.  Constipation.  MiraLAX daily. Senna-s.    -moving bowels, LBM 10/10    11. Leukocytosis  -down further to 9.5 today 10/4--->8.7 10/8   LOS: 13 days A FACE TO [place] E Niomi Valent 12/17/2019, 9:08 AM

## 2019-12-17 NOTE — Plan of Care (Signed)
Problem: Consults Goal: RH GENERAL PATIENT EDUCATION Description: See Patient Education module for education specifics. Outcome: Completed/Met   Problem: RH SKIN INTEGRITY Goal: RH STG SKIN FREE OF INFECTION/BREAKDOWN Description: With min assist  Outcome: Completed/Met   Problem: RH SAFETY Goal: RH STG ADHERE TO SAFETY PRECAUTIONS W/ASSISTANCE/DEVICE Description: STG Adhere to Safety Precautions With cues/reminders Assistance/Device. Outcome: Completed/Met   Problem: RH KNOWLEDGE DEFICIT GENERAL Goal: RH STG INCREASE KNOWLEDGE OF SELF CARE AFTER HOSPITALIZATION Description: With min assist Outcome: Completed/Met

## 2019-12-17 NOTE — Progress Notes (Signed)
Inpatient Rehabilitation Care Coordinator  Discharge Note  The overall goal for the admission was met for:   Discharge location: Yes, Home  Length of Stay: Yes, 13 Days  Discharge activity level: Yes, Supervision  Home/community participation: Yes  Services provided included: MD, RD, PT, OT, SLP, RN, CM, TR, Pharmacy, Neuropsych and SW  Financial Services: Private Insurance: Uninsured  Follow-up services arranged: OP PT/OT/SLP Comments (or additional information):  Patient/Family verbalized understanding of follow-up arrangements: Yes  Individual responsible for coordination of the follow-up plan: [name] [phone]  Confirmed correct DME delivered: Dyanne Iha 12/17/2019    Dyanne Iha

## 2019-12-17 NOTE — Progress Notes (Addendum)
Patient remains asleep during hourly rounds and prn, No acute distress or discomfort, HOB at 30% and safety alarm on because patient is attempting to lower the [facility]. interpretor call to explain to patient. Surgical Cranial and abdomen wounds skin intact, with no drainage, breakage or irritation, Patient is on High Risk precautions, question if a helmet is to be wore by patient when out of bed, will ask oncoming nurse to follow up with medical team in the morning. Cal bell within reach and bed alarm in place, Monitor and assisted, po liquids encouraged., Continue sleep chart monitoring

## 2019-12-17 NOTE — Progress Notes (Signed)
Patient discharged home with family member. Patient through interpreter was explained all discharge instructions and stated that they did not have any questions. Sanda Linger, LPN

## 2019-12-18 ENCOUNTER — Telehealth: Payer: Self-pay

## 2019-12-18 NOTE — Telephone Encounter (Signed)
SW received message from [place] inquiring if pt d/c on yesterday, and indicated still no updates from [facility] on if pt will be covered. She requested d/c summary and any orders for pt.   SW faxed Terry/RN CM with [employer]  ([phone]/J:[phone]) d/c summary and AVS, and informed on outpatient referral sent to [facility] for PT/OT/SLP. No further SW intervention.

## 2019-12-19 ENCOUNTER — Telehealth: Payer: Self-pay | Admitting: Registered Nurse

## 2019-12-19 NOTE — Telephone Encounter (Signed)
   TC Call Placed, [name] answered stating they don't speak English. [facility] line was called, Translator called, no one answered. She left message regarding the appointment. Will try calling again today.

## 2019-12-19 NOTE — Telephone Encounter (Signed)
Another TC Call Placed using the [facility] line, No answer. A detail message was left previously.  Transition Care Management Unsuccessful Follow-up Telephone Call  Date of discharge and from where:  12/17/2019  Attempts:  X 2 via [place]  Reason for unsuccessful TCM follow-up call: No one answered the phone.

## 2019-12-27 ENCOUNTER — Encounter: Payer: Self-pay | Attending: Physical Medicine and Rehabilitation | Admitting: Physical Medicine & Rehabilitation

## 2019-12-27 DIAGNOSIS — S065X9A Traumatic subdural hemorrhage with loss of consciousness of unspecified duration, initial encounter: Secondary | ICD-10-CM | POA: Insufficient documentation

## 2019-12-27 DIAGNOSIS — S065X3D Traumatic subdural hemorrhage with loss of consciousness of 1 hour to 5 hours 59 minutes, subsequent encounter: Secondary | ICD-10-CM | POA: Insufficient documentation

## 2019-12-27 DIAGNOSIS — G43809 Other migraine, not intractable, without status migrainosus: Secondary | ICD-10-CM | POA: Insufficient documentation

## 2020-01-21 ENCOUNTER — Other Ambulatory Visit: Payer: Self-pay | Admitting: Neurosurgery

## 2020-01-21 DIAGNOSIS — S065XAA Traumatic subdural hemorrhage with loss of consciousness status unknown, initial encounter: Secondary | ICD-10-CM

## 2020-01-21 DIAGNOSIS — S065X9A Traumatic subdural hemorrhage with loss of consciousness of unspecified duration, initial encounter: Secondary | ICD-10-CM

## 2020-02-15 ENCOUNTER — Ambulatory Visit
Admission: RE | Admit: 2020-02-15 | Discharge: 2020-02-15 | Disposition: A | Discharge status: Home / Self Care | Payer: Self-pay | Source: Ambulatory Visit | Attending: Neurosurgery | Admitting: Neurosurgery

## 2020-02-15 DIAGNOSIS — S065X9A Traumatic subdural hemorrhage with loss of consciousness of unspecified duration, initial encounter: Secondary | ICD-10-CM

## 2020-02-15 DIAGNOSIS — S065XAA Traumatic subdural hemorrhage with loss of consciousness status unknown, initial encounter: Secondary | ICD-10-CM

## 2022-06-05 IMAGING — DX DG CHEST 1V PORT
1 series · 1 of 1 positions shown · non-contrast
Comparison: Portable exam 7507 hours compared to 09/11/2019

CLINICAL DATA: Leukocytosis

EXAM:
PORTABLE CHEST 1 VIEW

[chest ap]
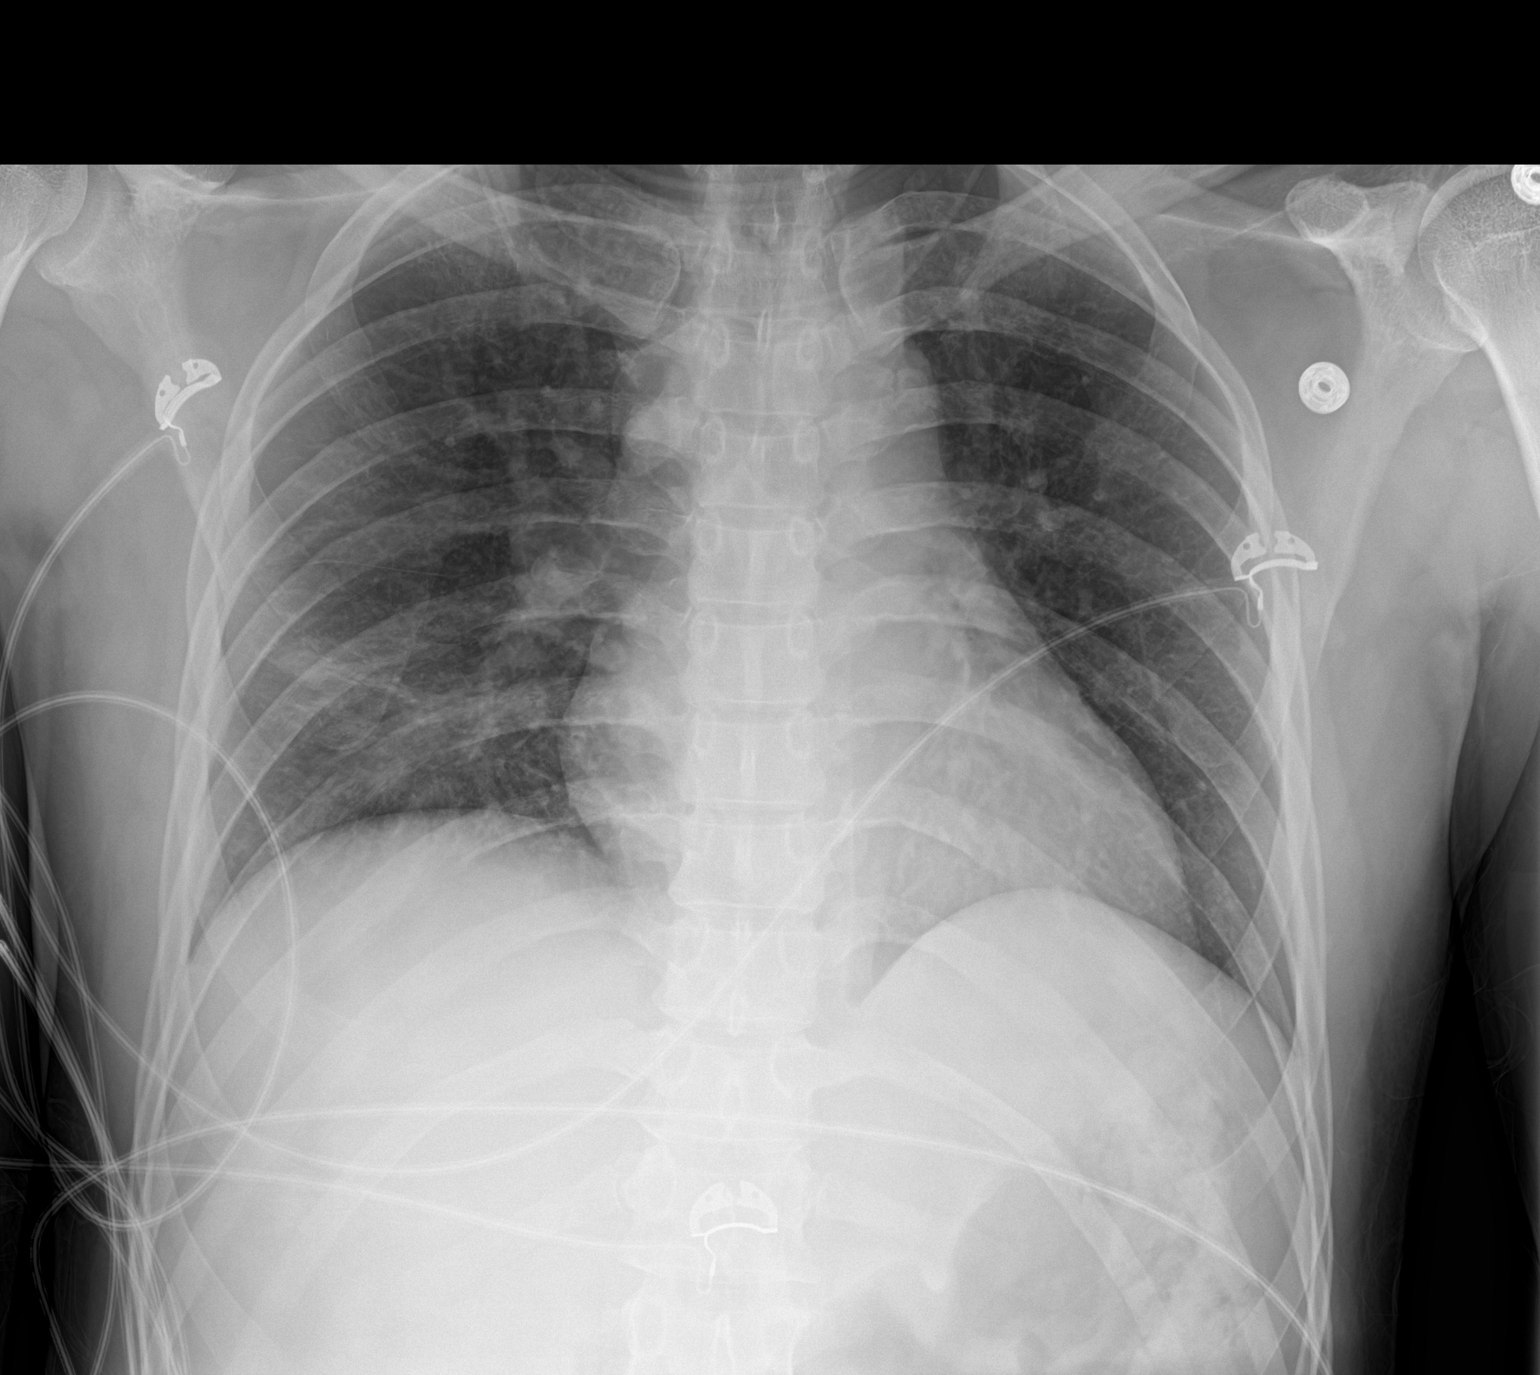

[1 of 1 positions shown; findings below may reference images not displayed]

FINDINGS: Normal heart size, mediastinal contours, and pulmonary vascularity.

Subsegmental atelectasis RIGHT base.

Remaining lungs clear.

No pulmonary infiltrate, pleural effusion or pneumothorax.

Old healed fracture mid LEFT clavicle.

No acute osseous findings.
IMPRESSION: Subsegmental atelectasis RIGHT base.
